# Patient Record
Sex: Male | Born: 1935
Health system: Southern US, Community
[De-identification: ages and names within clinical notes are randomized; demographics above are authoritative.]

## PROBLEM LIST (undated history)

## (undated) DIAGNOSIS — E785 Hyperlipidemia, unspecified: Secondary | ICD-10-CM

## (undated) DIAGNOSIS — I2510 Atherosclerotic heart disease of native coronary artery without angina pectoris: Secondary | ICD-10-CM

## (undated) DIAGNOSIS — R0989 Other specified symptoms and signs involving the circulatory and respiratory systems: Secondary | ICD-10-CM

## (undated) DIAGNOSIS — R002 Palpitations: Secondary | ICD-10-CM

## (undated) DIAGNOSIS — I359 Nonrheumatic aortic valve disorder, unspecified: Secondary | ICD-10-CM

## (undated) DIAGNOSIS — D649 Anemia, unspecified: Secondary | ICD-10-CM

## (undated) DIAGNOSIS — E1169 Type 2 diabetes mellitus with other specified complication: Secondary | ICD-10-CM

## (undated) DIAGNOSIS — Z952 Presence of prosthetic heart valve: Secondary | ICD-10-CM

## (undated) DIAGNOSIS — M1990 Unspecified osteoarthritis, unspecified site: Secondary | ICD-10-CM

## (undated) DIAGNOSIS — N289 Disorder of kidney and ureter, unspecified: Secondary | ICD-10-CM

## (undated) DIAGNOSIS — I200 Unstable angina: Secondary | ICD-10-CM

## (undated) DIAGNOSIS — D126 Benign neoplasm of colon, unspecified: Secondary | ICD-10-CM

## (undated) DIAGNOSIS — I350 Nonrheumatic aortic (valve) stenosis: Secondary | ICD-10-CM

## (undated) DIAGNOSIS — N400 Enlarged prostate without lower urinary tract symptoms: Secondary | ICD-10-CM

## (undated) DIAGNOSIS — I10 Essential (primary) hypertension: Secondary | ICD-10-CM

## (undated) DIAGNOSIS — I951 Orthostatic hypotension: Secondary | ICD-10-CM

## (undated) DIAGNOSIS — E119 Type 2 diabetes mellitus without complications: Secondary | ICD-10-CM

## (undated) HISTORY — PX: ESOPHAGOGASTRODUODENOSCOPY (EGD) WITH PROPOFOL: SHX5813

## (undated) HISTORY — DX: Hyperlipidemia, unspecified: E78.5

## (undated) HISTORY — DX: Benign prostatic hyperplasia without lower urinary tract symptoms: N40.0

## (undated) HISTORY — DX: Other specified symptoms and signs involving the circulatory and respiratory systems: R09.89

## (undated) HISTORY — DX: Presence of prosthetic heart valve: Z95.2

## (undated) HISTORY — DX: Orthostatic hypotension: I95.1

## (undated) HISTORY — PX: OTHER SURGICAL HISTORY: SHX169

## (undated) HISTORY — DX: Essential (primary) hypertension: I10

## (undated) HISTORY — DX: Palpitations: R00.2

## (undated) HISTORY — PX: AORTIC VALVE REPLACEMENT: SHX41

## (undated) HISTORY — PX: HERNIA REPAIR: SHX51

## (undated) HISTORY — PX: COLONOSCOPY WITH PROPOFOL: SHX5780

## (undated) HISTORY — DX: Nonrheumatic aortic (valve) stenosis: I35.0

## (undated) HISTORY — DX: Unstable angina: I20.0

## (undated) HISTORY — PX: TEE WITHOUT CARDIOVERSION: SHX5443

## (undated) HISTORY — DX: Atherosclerotic heart disease of native coronary artery without angina pectoris: I25.10

## (undated) HISTORY — PX: CORONARY ARTERY BYPASS GRAFT: SHX141

## (undated) HISTORY — PX: REPLACEMENT TOTAL KNEE BILATERAL: SUR1225

## (undated) HISTORY — DX: Nonrheumatic aortic valve disorder, unspecified: I35.9

## (undated) HISTORY — DX: Anemia, unspecified: D64.9

## (undated) HISTORY — DX: Type 2 diabetes mellitus with other specified complication: E11.69

## (undated) HISTORY — DX: Benign neoplasm of colon, unspecified: D12.6

---

## 1997-10-19 ENCOUNTER — Ambulatory Visit (HOSPITAL_COMMUNITY): Admission: RE | Admit: 1997-10-19 | Discharge: 1997-10-19 | Payer: Self-pay | Admitting: *Deleted

## 2000-02-22 ENCOUNTER — Ambulatory Visit (HOSPITAL_COMMUNITY): Admission: RE | Admit: 2000-02-22 | Discharge: 2000-02-22 | Payer: Self-pay | Admitting: Gastroenterology

## 2000-02-22 ENCOUNTER — Encounter (INDEPENDENT_AMBULATORY_CARE_PROVIDER_SITE_OTHER): Payer: Self-pay | Admitting: Specialist

## 2001-02-02 ENCOUNTER — Inpatient Hospital Stay (HOSPITAL_COMMUNITY): Admission: RE | Admit: 2001-02-02 | Discharge: 2001-02-05 | Payer: Self-pay | Admitting: Orthopedic Surgery

## 2001-02-23 ENCOUNTER — Encounter: Admission: RE | Admit: 2001-02-23 | Discharge: 2001-04-09 | Payer: Self-pay | Admitting: Orthopedic Surgery

## 2003-01-03 ENCOUNTER — Ambulatory Visit (HOSPITAL_BASED_OUTPATIENT_CLINIC_OR_DEPARTMENT_OTHER): Admission: RE | Admit: 2003-01-03 | Discharge: 2003-01-03 | Payer: Self-pay | Admitting: Urology

## 2003-05-06 ENCOUNTER — Encounter (INDEPENDENT_AMBULATORY_CARE_PROVIDER_SITE_OTHER): Payer: Self-pay | Admitting: Specialist

## 2003-05-06 ENCOUNTER — Ambulatory Visit (HOSPITAL_COMMUNITY): Admission: RE | Admit: 2003-05-06 | Discharge: 2003-05-06 | Payer: Self-pay | Admitting: Gastroenterology

## 2005-05-10 ENCOUNTER — Inpatient Hospital Stay (HOSPITAL_COMMUNITY): Admission: RE | Admit: 2005-05-10 | Discharge: 2005-05-13 | Payer: Self-pay | Admitting: Orthopedic Surgery

## 2005-06-03 ENCOUNTER — Encounter: Admission: RE | Admit: 2005-06-03 | Discharge: 2005-07-23 | Payer: Self-pay | Admitting: Orthopedic Surgery

## 2006-12-24 ENCOUNTER — Ambulatory Visit: Payer: Self-pay | Admitting: Cardiology

## 2007-01-05 ENCOUNTER — Ambulatory Visit: Payer: Self-pay

## 2007-01-05 ENCOUNTER — Encounter: Payer: Self-pay | Admitting: Cardiology

## 2007-06-24 ENCOUNTER — Ambulatory Visit: Payer: Self-pay | Admitting: Cardiology

## 2008-06-23 DIAGNOSIS — D126 Benign neoplasm of colon, unspecified: Secondary | ICD-10-CM

## 2008-06-23 DIAGNOSIS — E1169 Type 2 diabetes mellitus with other specified complication: Secondary | ICD-10-CM

## 2008-06-23 DIAGNOSIS — E785 Hyperlipidemia, unspecified: Secondary | ICD-10-CM

## 2008-06-23 DIAGNOSIS — Z87898 Personal history of other specified conditions: Secondary | ICD-10-CM

## 2008-06-23 DIAGNOSIS — I359 Nonrheumatic aortic valve disorder, unspecified: Secondary | ICD-10-CM | POA: Insufficient documentation

## 2008-06-23 HISTORY — DX: Hyperlipidemia, unspecified: E78.5

## 2008-06-23 HISTORY — DX: Benign neoplasm of colon, unspecified: D12.6

## 2008-06-23 HISTORY — DX: Nonrheumatic aortic valve disorder, unspecified: I35.9

## 2008-06-23 HISTORY — DX: Type 2 diabetes mellitus with other specified complication: E11.69

## 2008-06-29 ENCOUNTER — Ambulatory Visit: Payer: Self-pay | Admitting: Cardiology

## 2008-07-15 ENCOUNTER — Ambulatory Visit: Payer: Self-pay

## 2008-07-15 ENCOUNTER — Encounter: Payer: Self-pay | Admitting: Cardiology

## 2008-07-22 ENCOUNTER — Telehealth: Payer: Self-pay | Admitting: Cardiology

## 2008-11-30 ENCOUNTER — Encounter (INDEPENDENT_AMBULATORY_CARE_PROVIDER_SITE_OTHER): Payer: Self-pay | Admitting: *Deleted

## 2009-02-01 ENCOUNTER — Ambulatory Visit: Payer: Self-pay | Admitting: Cardiology

## 2010-01-30 ENCOUNTER — Encounter: Payer: Self-pay | Admitting: Cardiology

## 2010-01-31 ENCOUNTER — Ambulatory Visit: Payer: Self-pay | Admitting: Cardiology

## 2010-04-26 NOTE — Miscellaneous (Signed)
  Clinical Lists Changes  Observations: Added new observation of ECHOINTERP: 1. Left ventricle: The cavity size was normal. Wall thickness was    increased in a pattern of mild LVH. Systolic function was    vigorous. The estimated ejection fraction was in the range of 65%    to 70%. Wall motion was normal; there were no regional wall    motion abnormalities. Doppler parameters are consistent with    abnormal left ventricular relaxation (grade 1 diastolic    dysfunction). 2. Aortic valve: Trileaflet; moderately calcified leaflets. Cusp    separation was moderately reduced. There was moderate stenosis.    Mean gradient: 34mm Hg (S). Valve area: 0.92cm^2(VTI). Valve    area: 0.96cm^2 (Vmax). 3. Mitral valve: Mildly calcified annulus. 4. Left atrium: The atrium was mildly dilated. 5. Right atrium: There was the appearance of a Chiari network. 6. Tricuspid valve: Mild regurgitation. 7. Pulmonary arteries: PA peak pressure: 25mm Hg (S). 8. Pericardium, extracardiac: There was no pericardial effusion. (07/15/2008 9:42)      Echocardiogram  Procedure date:  07/15/2008  Findings:      1. Left ventricle: The cavity size was normal. Wall thickness was    increased in a pattern of mild LVH. Systolic function was    vigorous. The estimated ejection fraction was in the range of 65%    to 70%. Wall motion was normal; there were no regional wall    motion abnormalities. Doppler parameters are consistent with    abnormal left ventricular relaxation (grade 1 diastolic    dysfunction). 2. Aortic valve: Trileaflet; moderately calcified leaflets. Cusp    separation was moderately reduced. There was moderate stenosis.    Mean gradient: 34mm Hg (S). Valve area: 0.92cm^2(VTI). Valve    area: 0.96cm^2 (Vmax). 3. Mitral valve: Mildly calcified annulus. 4. Left atrium: The atrium was mildly dilated. 5. Right atrium: There was the appearance of a Chiari network. 6. Tricuspid valve: Mild  regurgitation. 7. Pulmonary arteries: PA peak pressure: 25mm Hg (S). 8. Pericardium, extracardiac: There was no pericardial effusion.

## 2010-04-26 NOTE — Assessment & Plan Note (Signed)
Summary: [facility]   Visit Type:  Follow-up Primary Provider:  Dr. Vernon Prey  CC:  AS.  History of Present Illness: The patient presents for yearly follow up.  Since I last saw him he has had no new cardiovascular complaints.  He is active and can push a mower without chest pain or SOB.  He denies palpitaitons presyncope or syncope.  He has had no PND or orthopnea.  He is now being treated for HTN.    Current Medications (verified): 1)  Aspirin 81 Mg  Tabs (Aspirin) .Marland Kitchen.. 1 By Mouth Daily 2)  Metformin Hcl 500 Mg Tabs (Metformin Hcl) .Marland Kitchen.. 1 By Mouth Two Times A Day 3)  Lipitor 80 Mg Tabs (Atorvastatin Calcium) .Marland Kitchen.. 1 By Mouth Daily 4)  Lisinopril 10 Mg Tabs (Lisinopril) .Marland Kitchen.. 1 By Mouth Daily  Allergies (verified): No Known Drug Allergies  Past History:  Past Medical History: Reviewed history from 06/23/2008 and no changes required. AORTIC STENOSIS (ICD-424.1) HYPERLIPIDEMIA-MIXED (ICD-272.4) DM (ICD-250.00) BENIGN PROSTATIC HYPERTROPHY, HX OF (ICD-V13.8) COLONIC POLYPS (ICD-211.3)  Past Surgical History: Reviewed history from 06/23/2008 and no changes required. 2 - knee replacements TURP.  Hernia repair  Review of Systems       As stated in the HPI and negative for all other systems.   Vital Signs:  Patient profile:   77 year old male Height:      71 inches Weight:      189 pounds BMI:     26.46 Pulse rate:   68 / minute Resp:     18 per minute BP sitting:   128 / 78  (right arm)  Vitals Entered By: Marrion Coy, CNA (January 31, 2010 9:22 AM)  Physical Exam  General:  Well developed, well nourished, in no acute distress. Head:  normocephalic and atraumatic Eyes:  PERRLA/EOM intact; conjunctiva and lids normal. Mouth:  Teeth, gums and palate normal. Oral mucosa normal. Neck:  Neck supple, no JVD. No masses, thyromegaly or abnormal cervical nodes. Chest Wall:  no deformities or breast masses noted Lungs:  Clear bilaterally to auscultation and  percussion. Abdomen:  Bowel sounds positive; abdomen soft and non-tender without masses, organomegaly, or hernias noted. No hepatosplenomegaly. Msk:  Back normal, normal gait. Muscle strength and tone normal. Extremities:  No clubbing or cyanosis. Neurologic:  Alert and oriented x 3. Skin:  Intact without lesions or rashes. Cervical Nodes:  no significant adenopathy Axillary Nodes:  no significant adenopathy Inguinal Nodes:  no significant adenopathy Psych:  Normal affect.   Detailed Cardiovascular Exam  Neck    Carotids: Carotids full and equal bilaterally without bruits.      Neck Veins: Normal, no JVD.    Heart    Inspection: no deformities or lifts noted.      Palpation: normal PMI with no thrills palpable.      Auscultation: S1 and S2 within normal limits, no S3, no S4, 3/6 apical systolic murmur radiating up the aortic outflow tract, no diastolic murmurs.  Vascular    Abdominal Aorta: no palpable masses, pulsations, or audible bruits.      Femoral Pulses: normal femoral pulses bilaterally.      Pedal Pulses: normal pedal pulses bilaterally.      Radial Pulses: normal radial pulses bilaterally.      Peripheral Circulation: no clubbing, cyanosis, or edema noted with normal capillary refill.     EKG  Procedure date:  01/31/2010  Findings:      Sinus rhythm, no acute ST  T wave chagnes.  Impression & Recommendations:  Problem # 1:  AORTIC STENOSIS (ICD-424.1)  I will repeat an echo in April which will be two years from the previous.  Orders: EKG w/ Interpretation (93000) Echocardiogram (Echo)  Problem # 2:  HYPERLIPIDEMIA-MIXED (ICD-272.4) He has good control per Dr. Christell Constant.  Patient Instructions: 1)  Your physician recommends that you schedule a follow-up appointment in: 1 yr with Dr Antoine Poche in [place] 2)  Your physician recommends that you continue on your current medications as directed. Please refer to the Current Medication list given to you today. 3)  Your  physician has requested that you have an echocardiogram. Scheduled for 07/13/2010 at 2 pm.Echocardiography is a painless test that uses sound waves to create images of your heart. It provides your doctor with information about the size and shape of your heart and how well your heart's chambers and valves are working.  This procedure takes approximately one hour. There are no restrictions for this procedure.

## 2010-06-22 ENCOUNTER — Other Ambulatory Visit: Payer: Self-pay | Admitting: Gastroenterology

## 2010-07-12 ENCOUNTER — Other Ambulatory Visit (HOSPITAL_COMMUNITY): Payer: Self-pay | Admitting: Cardiology

## 2010-07-12 DIAGNOSIS — I350 Nonrheumatic aortic (valve) stenosis: Secondary | ICD-10-CM

## 2010-07-13 ENCOUNTER — Ambulatory Visit (HOSPITAL_COMMUNITY): Payer: Medicare Other | Attending: Cardiology | Admitting: Radiology

## 2010-07-13 DIAGNOSIS — I350 Nonrheumatic aortic (valve) stenosis: Secondary | ICD-10-CM

## 2010-07-13 DIAGNOSIS — E119 Type 2 diabetes mellitus without complications: Secondary | ICD-10-CM | POA: Insufficient documentation

## 2010-07-13 DIAGNOSIS — I080 Rheumatic disorders of both mitral and aortic valves: Secondary | ICD-10-CM | POA: Insufficient documentation

## 2010-07-13 DIAGNOSIS — I359 Nonrheumatic aortic valve disorder, unspecified: Secondary | ICD-10-CM

## 2010-07-13 DIAGNOSIS — I079 Rheumatic tricuspid valve disease, unspecified: Secondary | ICD-10-CM | POA: Insufficient documentation

## 2010-07-13 DIAGNOSIS — E785 Hyperlipidemia, unspecified: Secondary | ICD-10-CM | POA: Insufficient documentation

## 2010-07-23 NOTE — Progress Notes (Signed)
No answer at home number - have attempted several times - will continue to call with results and appt scheduled for 5/2

## 2010-07-25 ENCOUNTER — Encounter: Payer: Self-pay | Admitting: Cardiology

## 2010-07-25 ENCOUNTER — Ambulatory Visit (INDEPENDENT_AMBULATORY_CARE_PROVIDER_SITE_OTHER): Payer: Medicare Other | Admitting: Cardiology

## 2010-07-25 ENCOUNTER — Encounter: Payer: Self-pay | Admitting: *Deleted

## 2010-07-25 VITALS — BP 111/63 | HR 71 | Ht 71.0 in | Wt 181.0 lb

## 2010-07-25 DIAGNOSIS — E785 Hyperlipidemia, unspecified: Secondary | ICD-10-CM

## 2010-07-25 DIAGNOSIS — I359 Nonrheumatic aortic valve disorder, unspecified: Secondary | ICD-10-CM

## 2010-07-25 DIAGNOSIS — E119 Type 2 diabetes mellitus without complications: Secondary | ICD-10-CM

## 2010-07-25 NOTE — Assessment & Plan Note (Signed)
His aortic stenosis is breasts severe gradient. He will need valve replacement. I discussed this with him and reviewed it with a heart model. He will need right and left heart catheterization prior to this. We discussed the risks and benefits of this and he understands and agrees to proceed.

## 2010-07-25 NOTE — Patient Instructions (Signed)
You are being schuduled for a cardiac cath on 08/07/2010 Please continue current medications

## 2010-07-25 NOTE — Progress Notes (Signed)
HPI The patient presents for followup of his aortic stenosis. Ascending echocardiogram a few days ago demonstrates that his AST has progressed significantly since the previous area of his EF was 65%. He had some LVH. His mean gradient was 58 with 89. The valve area 0.57. He actually reports that he still relatively asymptomatic but he had some burning one time while pushing a lawnmower on an incline. He otherwise remains active and has not had any shortness of breath, neck or arm discomfort, palpitations, presyncope or syncope. He denies any PND or orthopnea. He has had no weight gain or edema.  No Known Allergies  Current Outpatient Prescriptions  Medication Sig Dispense Refill  . aspirin 81 MG tablet Take 81 mg by mouth daily.        . ferrous sulfate 325 (65 FE) MG tablet Take 325 mg by mouth daily with breakfast.        . LIPITOR 80 MG tablet 1 tab qd      . lisinopril (PRINIVIL,ZESTRIL) 10 MG tablet 1 tab qd      . metFORMIN (GLUCOPHAGE) 500 MG tablet 1 tab twice a day      . polyethylene glycol-electrolytes (NULYTELY) 420 G solution         Past Medical History  Diagnosis Date  . Diabetes mellitus   . Aortic stenosis   . Hyperlipidemia   . Benign prostatic hypertrophy     Past Surgical History  Procedure Date  . Knee replacements     x 2  . Hernia repair     ROS:  As stated in the HPI and negative for all other systems.  PHYSICAL EXAM BP 111/63  Pulse 71  Ht 5\' 11"  (1.803 m)  Wt 181 lb (82.101 kg)  BMI 25.24 kg/m2  General:  Well developed, well nourished, in no acute distress. Head:  normocephalic and atraumatic Eyes:  PERRLA/EOM intact; conjunctiva and lids normal. Mouth:  Teeth, gums and palate normal. Oral mucosa normal. Neck:  Neck supple, no JVD. No masses, thyromegaly or abnormal cervical nodes. Chest Wall:  no deformities or breast masses noted Lungs:  Clear bilaterally to auscultation and percussion. Abdomen:  Bowel sounds positive; abdomen soft and  non-tender without masses, organomegaly, or hernias noted. No hepatosplenomegaly. Msk:  Back normal, normal gait. Muscle strength and tone normal. Extremities:  No clubbing or cyanosis. Neurologic:  Alert and oriented x 3. Skin:  Intact without lesions or rashes. Cervical Nodes:  no significant adenopathy Axillary Nodes:  no significant adenopathy Inguinal Nodes:  no significant adenopathy Psych:  Normal affect.   Detailed Cardiovascular Exam  Neck    Carotids: Carotids full and equal bilaterally without bruits.      Neck Veins: Normal, no JVD.    Heart    Inspection: no deformities or lifts noted.      Palpation: normal PMI with no thrills palpable.      Auscultation: S1 and S2 within normal limits, no S3, no S4, 3/6 apical systolic murmur radiating up the aortic outflow tract, no diastolic murmurs.  Vascular    Abdominal Aorta: no palpable masses, pulsations, or audible bruits.      Femoral Pulses: normal femoral pulses bilaterally.      Pedal Pulses: normal pedal pulses bilaterally.      Radial Pulses: normal radial pulses bilaterally.      Peripheral Circulation: no clubbing, cyanosis, or edema noted with normal capillary refill.    EKG:  Sinus rhythm rate 72, axis within normal limits,  intervals within normal limits, no acute ST-T wave changes.  ASSESSMENT AND PLAN

## 2010-07-25 NOTE — Assessment & Plan Note (Signed)
His lipids are well controlled and followed by Monica Becton, MD

## 2010-07-25 NOTE — Assessment & Plan Note (Signed)
His hemoglobin A1c 6.2 recently. He will continue on meds as listed.

## 2010-08-07 ENCOUNTER — Ambulatory Visit (HOSPITAL_COMMUNITY)
Admission: RE | Admit: 2010-08-07 | Discharge: 2010-08-07 | Disposition: A | Discharge status: Home / Self Care | Payer: Medicare Other | Source: Ambulatory Visit | Attending: Cardiology | Admitting: Cardiology

## 2010-08-07 DIAGNOSIS — I359 Nonrheumatic aortic valve disorder, unspecified: Secondary | ICD-10-CM

## 2010-08-07 DIAGNOSIS — R079 Chest pain, unspecified: Secondary | ICD-10-CM | POA: Insufficient documentation

## 2010-08-07 LAB — POCT I-STAT 3, ART BLOOD GAS (G3+)
Acid-Base Excess: 2 mmol/L (ref 0.0–2.0)
O2 Saturation: 93 %
TCO2: 28 mmol/L (ref 0–100)
pO2, Arterial: 67 mmHg — ABNORMAL LOW (ref 80.0–100.0)

## 2010-08-07 LAB — BASIC METABOLIC PANEL
BUN: 11 mg/dL (ref 6–23)
GFR calc Af Amer: >60 mL/min (ref >60)
GFR calc non Af Amer: >60 mL/min (ref >60)
Glucose, Bld: 119 mg/dL — ABNORMAL HIGH (ref 70–99)
Potassium: 4.1 mEq/L (ref 3.5–5.1)

## 2010-08-07 LAB — CBC
HCT: 37.1 % — ABNORMAL LOW (ref 39.0–52.0)
Hemoglobin: 12.4 g/dL — ABNORMAL LOW (ref 13.0–17.0)
Platelets: 176 10*3/uL (ref 150–400)
RBC: 4.33 MIL/uL (ref 4.22–5.81)

## 2010-08-07 LAB — APTT: aPTT: 32 seconds (ref 24–37)

## 2010-08-07 LAB — POCT I-STAT 3, VENOUS BLOOD GAS (G3P V)
pCO2, Ven: 45.9 mmHg (ref 45.0–50.0)
pO2, Ven: 38 mmHg (ref 30.0–45.0)

## 2010-08-07 NOTE — Assessment & Plan Note (Signed)
HEALTHCARE                            CARDIOLOGY OFFICE NOTE   [name], [name]                         MRN:          [id number]  DATE:12/24/2006                            DOB:          Jan 12, 1933    PRIMARY CARE PHYSICIAN:  Dr. Vernon Prey.   REASON FOR CONSULTATION:  Evaluate patient with heart murmur.   HISTORY OF PRESENT ILLNESS:  The patient is a very pleasant, 77 year old  gentleman without prior cardiac history other than evaluation for chest  pain in the 1970s. He says he had a negative stress test at that time.  He has otherwise gotten along well. He was recently found to have a  heart murmur and was referred for evaluation of this. He says this has  never been evaluated in the past. He is retired but still remains  active. He mows the lawn for instance. He pushes a mower. With this, he  denies any chest pressure, neck or arm discomfort. He does not have any  palpitations, presyncope or syncope. He denies any shortness of breath,  PND or orthopnea. He has no swelling or weight gain.   PAST MEDICAL HISTORY:  Diabetes x3 years, hyperlipidemia x2 years,  benign prostatic hypertrophy, colonic polyps resected.   PAST SURGICAL HISTORY:  Both knees replaced, TURP, hernia repair.   ALLERGIES:  None.   MEDICATIONS:  1. Metformin 500 mg daily.  2. Lipitor 40 mg daily.  3. Aspirin 325 mg daily.   SOCIAL HISTORY:  He smokes a few cigars but has never really smoked  cigarettes. He is retired. He is married and has 4 children. His wife is  one of our patients.   FAMILY HISTORY:  Noncontributory for early coronary artery disease.   REVIEW OF SYSTEMS:  As stated in the HPI, positive for occasional cough,  constipation. Negative for other systems.   PHYSICAL EXAMINATION:  GENERAL:  The patient is in no distress.  VITAL SIGNS:  Blood pressure 126/74, heart rate 72 and regular, weight  187 pounds, body mass index 26.  HEENT:  Eyelids  unremarkable. Pupils equal round and reactive to light.  Fundi not visualized. Oral mucosa unremarkable.  NECK:  No jugular venous distention at 45 degrees, carotid upstroke  brisk and symmetric, no bruits, no thyromegaly. Transmitted right  systolic murmur in the right carotid.  LYMPHATICS:  No cervical, axillary or inguinal adenopathy.  LUNGS:  Clear to auscultation bilaterally.  BACK:  No costovertebral angle tenderness.  CHEST:  Unremarkable.  HEART:  PMI not displaced or sustained, S1 within normal limits, soft  S2, no S3, no S4, 3/6 apical systolic murmur radiating out the right  aortic outflow tract, mid peaking, no diastolic murmurs.  ABDOMEN:  Flat, positive bowel sounds, normal in frequency and pitch, no  bruits, no rebound, no guarding, no midline pulsatile mass, no  hepatomegaly, no splenomegaly.  SKIN:  No rashes, no nodules.  EXTREMITIES:  2+ pulses throughout, no edema, no cyanosis, no clubbing.  NEUROLOGIC:  Oriented to person, place and time. Cranial nerves II-XII  grossly intact. Motor grossly intact.  EKG sinus rhythm, rate 72, axis within normal limits, interval is within  normal limits. No acute ST-T wave changes.   ASSESSMENT/PLAN:  1. Murmur. The patient has a murmur consistent with aortic stenosis. I      expect this to be mild or moderate by physical findings. He is not      having any symptoms related to this. I will check an echocardiogram      to further evaluate this. I suspect we will be able to follow this      clinically.  2. Tobacco. He understands the need to not smoke at all.  3. Hyperlipidemia per Dr. Christell Constant. He is having this followed closely.  4. Diabetes per Dr. Christell Constant.  5. Followup - I will see him back in 6 months. I will consider a      stress test after the echocardiogram given his risk factors.     Rollene Rotunda, MD, [facility]  Electronically Signed    JH/MedQ  DD: 12/24/2006  DT: 12/24/2006  Job #: [id number]   cc:   [name],  M.D.

## 2010-08-07 NOTE — Assessment & Plan Note (Signed)
[facility]                            CARDIOLOGY OFFICE NOTE   [name], [name]                         MRN:          [id number]  DATE:06/24/2007                            DOB:          1932/07/31    The primary is [name], M.D.   REASON FOR PRESENTATION:  Evaluate patient with aortic stenosis.   HISTORY OF PRESENT ILLNESS:  The patient is 77 years old.  He had a  murmur and I saw him for the first time in October of last year.  He had  an echocardiogram demonstrating a three-leaflet aortic valve with  moderate stenosis.  The mean gradient was 23.  The valve area calculated  to be 1.82.   The patient returns today for follow-up.  He still is quite  asymptomatic.  He is able to push a mower as recently as last night  without any shortness of breath.  He denies any chest discomfort, neck  or arm discomfort.  He has no lightheadedness, presyncope or syncope.  He does not have any palpitations.   PAST MEDICAL HISTORY:  1. Diabetes mellitus x3 years.  2. Dyslipidemia x2 years.  3. Benign prostatic hypertrophy.  4. Colonic polyps resected.  5. Both knees replaced.  6. TURP.  7. Hernia repair.   ALLERGIES:  None.   MEDICATIONS:  1. Lipitor 40 mg daily.  2. Metformin 500 mg daily.   REVIEW OF SYSTEMS:  As stated in the HPI and otherwise negative for  other systems.   PHYSICAL EXAMINATION:  The patient is in no distress.  Blood pressure 140/80, heart rate 66 and regular, weight 190 pounds,  body mass index 26.  HEENT:  Eyelids unremarkable.  Pupils equal, round, reactive to light.  Fundi not visualized.  NECK:  No jugular distention at 45 degrees.  Carotid upstroke brisk and  symmetrical.  No bruits.  No thyromegaly.  Transmitted systolic murmurs.  LYMPHATICS:  No adenopathy.  LUNGS:  Clear to auscultation bilaterally.  BACK:  There is no costovertebral angle tenderness.  CHEST:  Unremarkable.  HEART:  PMI not displaced or  sustained, S1 and S2 within normal limits,  no S3, no S4, a 3/6 apical systolic murmur radiating out the aortic  outflow tract and mid-peaking, no diastolic murmurs.  ABDOMEN:  Flat, positive bowel sounds, normal in frequency and pitch, no  bruits, no midline pulsatile mass, no organomegaly.  SKIN:  No rashes, no nodules.  EXTREMITIES:  2+ pulses, no edema.  NEUROLOGIC:  Grossly intact.   EKG:  Sinus rhythm, rate 66, axis within normal limits, intervals within  normal limits, no acute ST wave change.   ASSESSMENT/PLAN:  1. Aortic stenosis.  The patient has moderate aortic stenosis.  We      discussed this diagnosis and I reviewed it with him on a heart      model.  He understands the symptoms that could develop if he has      any worsening stenosis.  My plan is to get an echo in October.      That will  be 1 year for the time of his first echo.  We will see if      it is progressing rapidly and decide on the timing of further      evaluations.  2. Hypertension.  Blood pressure is borderline.  He is going to use      weight loss with therapeutic lifestyle changes to try to make sure      that this does not go up and require further therapy.  3. Diabetes.  Per Dr. Christell Constant.  4. Dyslipidemia.  Per Dr. Christell Constant.  5. Follow-up.  We will see the patient back in 1 year but, again, we      will get the echo in October.     Rollene Rotunda, MD, [facility]  Electronically Signed    JH/MedQ  DD: 06/24/2007  DT: 06/24/2007  Job #: [id number]   cc:   [name], M.D.

## 2010-08-07 NOTE — Assessment & Plan Note (Signed)
[facility]                            CARDIOLOGY OFFICE NOTE   [name], [name]                         MRN:          [id number]  DATE:06/29/2008                            DOB:          1933/02/25    PRIMARY CARE PHYSICIAN:  [name], MD   REASON FOR PRESENTATION:  Evaluate the patient with aortic stenosis.   HISTORY OF PRESENT ILLNESS:  The patient is 77 years old.  He has  history of aortic stenosis.  I was planning on getting an echocardiogram  in October of last year, which would have been 1 year from the previous.  However, this did not happen for some reason.  He says that he continues  to feel well.  He mowed the lawn recently and worked in the yard most of  the day.  He was fatigued after this, but he had no shortness of breath,  chest discomfort, neck or arm discomfort.  He had no palpitations,  presyncope, or syncope.  He does not describe PND or orthopnea.   PAST MEDICAL HISTORY:  1. Diabetes mellitus x4 years.  2. Dyslipidemia x3 years.  3. Mild-to-moderate aortic stenosis.  4. Benign prostatic hypertrophy.  5. Colonic polyp resected.  6. Both knees replaced.  7. TURP.  8. Hernia repair.   ALLERGIES:  None.   MEDICATIONS:  1. Lipitor 40 mg daily.  2. Metformin 500 mg daily.   REVIEW OF SYSTEMS:  As stated in the HPI, and otherwise negative for  other systems.   PHYSICAL EXAMINATION:  GENERAL:  The patient is in no distress.  VITAL SIGNS:  Blood pressure 124/74, heart rate 76 and regular, weight  189 pounds, and body mass index 25.  HEENT:  Eyelids unremarkable; pupils equal, round, and react to light;  fundi not visualized, oral mucosa unremarkable.  NECK:  No jugular venous distention at 45 degrees; carotid upstroke  brisk and symmetric; no bruits, no thyromegaly, transmitted systolic  murmur.  LYMPHATICS:  No adenopathy.  LUNGS:  Clear to auscultation bilaterally.  BACK:  No costovertebral angle tenderness.  CHEST:  Unremarkable.  HEART:  PMI not displaced or sustained; S1 and S2 within normal limits;  no S3, no S4; 3/6 apical systolic murmur radiating out the aortic  outflow tract, mid peaking, no diastolic murmur.  ABDOMEN:  Flat; positive bowel sounds, normal in frequency and pitch; no  bruits; no rebound; no guarding; no midline pulsatile mass; no  hepatomegaly, splenomegaly.  SKIN:  No rashes.  No nodules.  EXTREMITIES:  Pulses 2+, no edema.  NEURO:  Oriented to person, place, and time; cranial nerves II through  XII grossly intact, motor grossly intact.   EKG; sinus rhythm, rate 76, axis within normal limits, intervals within  normal limits, no acute ST-T wave changes.   ASSESSMENT AND PLAN:  1. Aortic stenosis.  Murmur does not appear to be late peaking.  I do      not find any other overt evidence that he has got severe AS. He      does not have any symptoms.  However, I would like to evaluate this      again with an echocardiogram to make sure there has been no      dramatic change.  If not, I will follow it clinically and with      fairly infrequent echos.  He knows the symptoms that could develop      if he has worsening aortic stenosis and he will notify me.  2. Diabetes.  He said it was slightly elevated recently, but Dr. Christell Constant      is addressing this.  3. Dyslipidemia.  I reviewed his lipid profile and it was excellent.      He will remain on the meds as listed.  4. Followup.  I will see him back in 1 year or sooner if needed.     Rollene Rotunda, MD, [facility]  Electronically Signed    JH/MedQ  DD: 06/29/2008  DT: 06/30/2008  Job #: [id number]   cc:   [name], M.D.

## 2010-08-10 ENCOUNTER — Encounter: Payer: Medicare Other | Admitting: Thoracic Surgery (Cardiothoracic Vascular Surgery)

## 2010-08-10 NOTE — Consult Note (Signed)
[facility]  Patient:    [name], [name] Visit Number: [id number] MRN: [id number]          Service Type: SUR Location: [id number] Attending Physician:  Loanne Drilling Dictated by:   Bertram Millard. Dahlstedt, M.D. Proc. Date: 02/02/01 Admit Date:  02/02/2001                            Consultation Report  CONSULATION/PROCEDURE NOTE  REASON FOR CONSULTATION:  Urinary retention, urethral stricture.  HISTORY OF PRESENT ILLNESS:  This is a 77 year old male who underwent left total knee arthroplasty today.  He has been seen before for BPH.  He had a TURP several years ago.  I did not see him prior to his operation, and I am unaware of any voiding symptoms.  However, under anesthesia, it was difficult to place a catheter, and urologic consultation is requested.  PHYSICAL EXAMINATION:  Examination in the PACU revealed an uncircumcised phallus with an easily retracted foreskin.  The glans was normal.  Meatus normal location and size.  Scrotal skin was unremarkable.  Peroneal skin unremarkable.  Rectal not performed.  DESCRIPTION OF PROCEDURE:  I tried placing a 16 and 14 coude tipped catheter unsuccessfully.  Using flexible cystoscopy, I realized that there was a very tight stricture at the bladder neck area.  I easily passed the guidewire through this contracture.  I then tried dilating this stricture with Heymann followers using the Seldinger technique.  I was unable to dilate the stricture.  I then placed the fiberglass filiform cystoscopically and dilated him to 20 [place] with filiforms and followers.  This went quite easily.  I then passed a 54 [place] coude tipped catheter to drainage.  IMPRESSION: 1. Urinary retention, specified. 2. Bladder neck contracture. 3. History of benign prostatic hypertrophy, status post    transurethral resection of prostate.  PLAN: 1. Gentamycin 80 mg IV ordered. 2. I would keep the patient on oral antibiotics for five  days.    Will write this in the orders. 3. Leave catheter in for two days until it is removed on    in the morning on February 04, 2001, for a voiding trial. 4. We will follow. Dictated by:   Bertram Millard. Dahlstedt, M.D. Attending Physician:  Loanne Drilling DD:  02/02/01 TD:  02/03/01 Job: [id number]

## 2010-08-10 NOTE — Op Note (Signed)
NAMESAIGE, BUSBY                            ACCOUNT NO.:  [id number]   MEDICAL RECORD NO.:  [id number]                   PATIENT TYPE:  AMB   LOCATION:  ENDO                                 FACILITY:  [facility]   PHYSICIAN:  John C. Madilyn Fireman, M.D.                 DATE OF BIRTH:  [birth year]   DATE OF PROCEDURE:  05/06/2003  DATE OF DISCHARGE:                                 OPERATIVE REPORT   PROCEDURE:  Colonoscopy.   INDICATIONS FOR PROCEDURE:  History of a large tubulovillous adenoma with  focal high-grade dysplasia two years ago.   DESCRIPTION OF PROCEDURE:  The Olympus video colonoscope was inserted into  the rectum and advanced to the cecum, confirmed by transillumination of  McBurney's point and visualization of the ileocecal valve and appendiceal  orifice.  Prep was good.  The cecum appeared normal with no masses, polyps,  diverticula, or other mucosal abnormalities.  Within the ascending colon,  there was a 1.2 cm sessile, round polyp that was removed by snare.  The  remainder of the ascending, transverse, descending and sigmoid appeared  normal with no further polyps, masses, diverticula, or other mucosal  abnormalities.  The rectum likewise appeared normal and retroflexed view of  the anus revealed no obvious internal hemorrhoids.  The scope was then  withdrawn and the patient returned to the recovery room in stable condition.  He  tolerated the procedure well and there were no immediate complications.   IMPRESSION:  Moderately large ascending colon polyp.   PLAN:  Await histology.  Assuming no malignancy, will probably repeat  coloscopy in three years.                                               John C. Madilyn Fireman, M.D.    JCH/MEDQ  D:  05/06/2003  T:  05/06/2003  Job:  [id number]   cc:   [name], M.D.  [address] [place]  [place] [address]  Fax: [phone]

## 2010-08-10 NOTE — Op Note (Signed)
NAMEJIM, LUNDIN                  ACCOUNT NO.:  [id number]   MEDICAL RECORD NO.:  [id number]          PATIENT TYPE:  INP   LOCATION:  X003                         FACILITY:  [facility]   PHYSICIAN:  Ollen Gross, M.D.    DATE OF BIRTH:  1933/01/03   DATE OF PROCEDURE:  05/10/2005  DATE OF DISCHARGE:                                 OPERATIVE REPORT   PREOPERATIVE DIAGNOSIS:  Osteoarthritis right knee.   POSTOPERATIVE DIAGNOSIS:  Osteoarthritis right knee.   PROCEDURE:  Right total knee arthroplasty.   SURGEON:  Ollen Gross, M.D.   ASSISTANT:  Alexzandrew L. Julien Girt, P.A.   ANESTHESIA:  Attempted spinal then conversion to general.   ESTIMATED BLOOD LOSS:  Minimal.   DRAINS:  Hemovac x1   TOURNIQUET TIME:  45 minutes at 300 mmHg.   COMPLICATIONS:  None.   CONDITION:  Stable to recovery.   BRIEF CLINICAL NOTE:  Mr. Gaffey is a 77-year-old male with end-stage  osteoarthritis of the right knee. He has had a previous successful left  total knee arthroplasty and presents now for right total knee arthroplasty.   PROCEDURE IN DETAIL:  After attempted administration of spinal anesthetic a  tourniquet was placed high in the right thigh and the right lower extremity  prepped and draped in the usual sterile fashion. The extremities were  wrapped in Esmarch, and knee flexed, and tourniquet inflated to 300 mmHg. A  midline incision was made with a 10-blade to the subcutaneous tissue. He  felt the incision, thus he was converted over to general anesthesia. Once  under general, we completed the incision through the subcutaneous tissue to  a level of the extensor mechanism. Fresh blade used to make a medial  parapatellar arthrotomy. Soft tissue over the proximal and medial tibia  subperiosteally elevated to the joint line with a knife and into the  semimembranosus bursa with a Cobb elevator. Soft tissue over the proximal  lateral tibia was also elevated with attention being paid to avoid  the  patellar tendon on the tibial tubercle. Patella subluxed laterally and knee  flexed to 90 degrees; and ACL and PCL removed. A drill was used to create a  starting hole in the distal femur, and canal thoroughly irrigated. A 5-  degree right valgus alignment guide is placed and referencing with the  posterior condyles rotations are marked and a block pinned to remove 10 mm  off the distal femur. Distal femoral resection was made with an oscillating  saw. Size 4 is the most appropriate femoral component and the rotations  marked off the epicondylar axis. Size 4 cutting block is placed and the  anterior, posterior, and chamfer cuts made.   Tibia subluxed forward and the menisci removed. Extramedullary tibial  alignment guides placed referencing proximally at the medial aspect of  tibial tubercle and distally along the second metatarsal axis and tibial  crest. A block is pinned to remove 10-mm of the non deficient lateral side.  Tibial resection is made an oscillating saw. We had to take an additional 2  mm to get to the  bottom of the medial defect. The size 5 is most appropriate  tibial component and the proximal tibia is prepared with a modular drill and  keel punch for a size 5. Femoral preparation is completed with the  intercondylar cut for the size 4.   Size five mobile bearing tibial trial with a size 4 posterior stabilized  femoral trial and a 12.5 mm posterior stabilized rotating platform insert  trial was placed. We used a 12.5 because of an additional tibial resection.  With the 12.5 full extension is achieved with excellent varus and valgus  balance throughout full range of motion.   The patella is then everted and thickness measured to be 24 mm. Freehand  resection was taken 13 mm, 41-template is placed, lug holes were drilled,  trial patella was placed, and it tracks normally. Osteophytes then removed  with the posterior femur trial in place. All trials were removed and  the cut  bone surfaces prepared with pulsatile lavage. Cement is mixed and once  reimplantation a size 5 mobile bearing tibial tray, size 4 posterior  stabilized femur, and 41 patella are cemented into place. The patella is  held with the clamp. A trial 12.5 insert is placed and the knee held in full  extension; and all extruded cement removed. Once the cement is fully  hardened, then the permanent 12.5 mm posterior stabilized rotating platform  insert is placed into the tibial tray. The wound is copiously irrigated with  saline solution; and the extensor mechanism closed over a Hemovac drain with  interrupted #1 PDS. Flexion against gravity is 135 degrees.   The tourniquet is released after the total time of 45 minutes. Subcu is  closed with interrupted 2-0 Vicryl subcuticular running 4-0 Monocryl.  Incisions cleaned and dried and Steri-Strips and a bulky sterile dressing  applied. Drains hooked to suction  He was placed into a knee immobilizer,  and awakened and transported to recovery in stable condition.      Ollen Gross, M.D.  Electronically Signed     FA/MEDQ  D:  05/10/2005  T:  05/10/2005  Job:  [id number]

## 2010-08-10 NOTE — Discharge Summary (Signed)
[name]  Patient:    [name], [name] Visit Number: [id number] MRN: [id number]          Service Type: SUR Location: 4W 0468 [id number] Attending Physician:  Loanne Drilling Dictated by:   Alexzandrew L. Perkins, P.A.-C. Admit Date:  02/02/2001 Discharge Date: 02/05/2001                             Discharge Summary  ADMISSION DIAGNOSES: 1. End stage left knee degenerative arthritis. 2. Benign prostatic hypertrophy. 3. Status post transurethral resection of prostate.  DISCHARGE DIAGNOSES: 1. Osteoarthritis of left knee, status post left total knee replacement    arthroplasty. 2. Mild postoperative anemia 3. Positive fluid balance. 4. Mild postoperative hyponatremia. 5. Postoperative urinary retention secondary to urethral stricture. 6. Bladder neck contracture.  CONSULTATION:  Urology, Dr. Retta Diones.  PROCEDURE:  The patient was taken to the OR on 02/02/01, and underwent a left total knee replacement arthroplasty.  Surgeon was Dr. Ollen Gross, assistant was Ralene Bathe, P.A.-C.  Surgery was done under general anesthesia.  HISTORY OF PRESENT ILLNESS:  The patient is a 77 year old male who has been seen for continued progressive problems of the left knee.  He has been treated conservatively in the past, however, he has not responded to conservative measures.  It is felt due to the pain and also the fact that it had been interfering with his day to day activities, it was felt he would benefit from undergoing a total knee replacement arthroplasty.  The patient is subsequently admitted to the hospital for the procedure.  LABORATORY DATA:  CBC on admission showed a hemoglobin of 14.8, hematocrit 41.9, white blood cell count 10.5, red blood cell count 4.84.  Serial hemoglobins and hematocrits were followed throughout the hospital course, last noted hemoglobin and hematocrit was 10.6 and 30.6.  PT and PTT on admission were 13.2 and 28, respectively,  with an INR of 1.0.  Serum prothrombin times were followed per Coumadin protocol.  Last noted PT/INR prior to discharge was 19.2 and 1.9.  Chem panel on admission all within normal limits.  Serial BMETs were followed throughout the hospital course.  Sodium dropped postoperatively down to a level of 132, however, started to come back up, and was back up to 134 prior to discharge.  Glucose did elevate from the admission level of 110 up to 245.  The dextrose was removed from his IV fluids, and his glucose came back down to 162.  Urinalysis on admission was negative.  Blood group type O+. I do not see a chest x-ray or EKG on this chart at the time of dictation.  HOSPITAL COURSE:  The patient was admitted to [facility], taken to the OR, underwent the above stated procedure without complication.  The patient tolerated the procedure well.  Later transferred to the recovery room. Please note it was difficult for placing the Foley, therefore a urology consult was called.  The patient was seen and evaluated by Dr. Retta Diones in the operating room.  Foley catheter was placed.  It was recommended that he continue with the Foley catheter for two days, and he was also placed on gentamicin IV for coverage.  The patient was placed on PCA analgesia for pain control following surgery.  Hemovac was pulled on postoperative day #1.  He was noted to have fluid balance, and underwent a mild diuresis, utilizing Lasix.  The patient was noted to have a  drop in his sodium over the first two days, however, did start to come back up, it got down to 132, came back up to 134 prior to discharge.  The patient was also noted to have an elevated glucose level after surgery with a normal preoperative glucose level. Therefore, the D5 was removed from his fluids, and his glucose levels came back down.  Foley was removed on postoperative day #2.  PCA was also discontinued at that time.  Physical therapy was consulted to  assist with gait training ambulation following surgery.  The patient did quite well with physical therapy.  He was ambulating only 5 feet by postoperative day #1, however, increased to 60 feet by postoperative day #2, and was walking greater than 130 feet prior to his discharge.  He continued to improve.  Dressing changes were initiated on postoperative day #2.  The wound was healing well, and by postoperative day #3, the incision looked fine.  His hemoglobin remained stable.  He had been weaned off of his Foley, and also his PCA analgesics.  He was taking p.o. analgesics.  It was recommended that he be placed on Cipro p.o. x 1 more day.  Due to the fact that he was doing well, he was discharged home on 02/05/01.  DISCHARGE PLAN:  The patient was discharged home on 02/05/01.  DISCHARGE DIAGNOSES:  Same as above.  DISCHARGE MEDICATIONS: 1. Cipro 250 mg p.o. t.i.d. x 1 day. 2. Coumadin as per pharmacy protocol. 3. Trinsicon x 3 weeks. 4. Colace. 5. Robaxin. 6. Percocet.  DIET:  As tolerated.  ACTIVITY:  Full weightbearing.  Total knee protocol.  Home health physical therapy, home health nursing.  [facility].  FOLLOWUP:  Call for an appointment on Tuesday, February 17, 2001, at [phone].  CONDITION ON DISCHARGE:  Improved. Dictated by:   Alexzandrew L. Perkins, P.A.-C. Attending Physician:  Loanne Drilling DD:  03/03/01 TD:  03/04/01 Job: [id number]

## 2010-08-10 NOTE — Procedures (Signed)
[facility]  Patient:    [name], [name]                           MRN: [id number] Proc. Date: 02/22/00 Attending:  Everardo All. Madilyn Fireman, M.D. CC:         Monica Becton, M.D.                           Procedure Report  PROCEDURE:  Colonoscopy with polypectomy.  INDICATIONS FOR PROCEDURE:  Polyps seen on flexible sigmoidoscopy.  DESCRIPTION OF PROCEDURE:  The patient was placed in the left lateral decubitus position and placed on the pulse monitor with continuous low-flow oxygen delivered by nasal cannula.  He was sedated with 50 mg IV Demerol and 6 mg IV Versed.  The Olympus video colonoscope was inserted into the rectum and advanced to the cecum; confirmed by transillumination of McBurneys point and visualization of the ileocecal valve and appendiceal orifice.  Prep was excellent.  The cecum; ascending, transverse, descending and sigmoid colon all appeared normal with no masses, polyps, diverticuli or other mucosal abnormalities. Entered into the rectum and approximately 12 cm from the anus there was a large 2 cm polyp on the short stalk.  This was removed in one piece with the snare loop and retrieved through the anus intact.  The remainder of the rectum appeared normal.  The colonoscope was then withdrawn. The patient returned to the recovery room in stable condition.  He tolerated the procedure well and there were no immediate complications.  IMPRESSION: 1. Large rectal polyp. 2. Otherwise normal colonoscopy.  PLAN:  Await histology to rule out malignancy and to determine interval for future surveillance colonoscopy. DD:  02/22/00 TD:  02/22/00 Job: [id number]

## 2010-08-10 NOTE — Discharge Summary (Signed)
NAMEANTWIAN, Joseph                  ACCOUNT NO.:  [id number]   MEDICAL RECORD NO.:  [id number]          PATIENT TYPE:  INP   LOCATION:  1519                         FACILITY:  [facility]   PHYSICIAN:  Ollen Gross, M.D.    DATE OF BIRTH:  21-Feb-1933   DATE OF ADMISSION:  05/10/2005  DATE OF DISCHARGE:  05/13/2005                                 DISCHARGE SUMMARY   ADMISSION DIAGNOSES:  1.  Osteoarthritis of the right knee.  2.  Hypercholesterolemia.  3.  Past history of postoperative anemia.  4.  Past surgical history of postoperative hypernatremia.  5.  Past history of postoperative urinary retention.  6.  Bladder neck contracture.   DISCHARGE DIAGNOSES:  1.  Osteoarthritis right knee status post right total knee arthroplasty.  2.  Mild postoperative cholesterolemia, did not require transfusion.  3.  Postoperative hyponatremia.  4.  Past history of postoperative urinary retention.  5.  Bladder neck contracture.   PROCEDURES:  On May 10, 2005, right total knee surgery, Dr. Lequita Halt.  Assistant, Julien Girt, P.A.-C.  Anesthesia:  Attempted spinal, then conversion  to general.   CONSULTATIONS:  None.   BRIEF HISTORY:  [name] is a 77 year old male with end-stage arthroscopy to  the right knee as previous successful left total knee, now presents for  right total knee.   LABORATORY DATA:  Preop CBC:  Hemoglobin 14.1, hematocrit 40.6, white cell  count 8.0, postoperative hemoglobin 11.6.  Last noted H&H 10.3 and 30.0.  PT/PTT on admission 13.6 and 31.  INR 1.0.  Last noted pro time INR after  serial pro times, 18.0 and 1.5.  Chemistries on admission all within normal  limits.  Postop serial B-mets were followed.  Sodium dropped from 142 down  to 134, stabilized at 134.  Electrolytes remained within normal limits.  Preop UA negative.  Blood type O positive.  Two-view chest on May 06, 2005, no active disease.  EKG May 06, 2005, normal sinus rhythm.  Normal EKG.  .   HOSPITAL COURSE:  Admitted to [facility].  Tolerated procedure  well.  Transported to recovery then the Orthopedic floor.  Started on PCA  and p.o. analgesics.  Doing well on day one.  Had very minimal pain, slight  postoperative temperature.  Treated with antipyretics and incentive  spirometer.  PC and IV's were discontinued.  Foley on day #2 was doing well.  Dressing was changed.  Incision looked good.  From a therapy standpoint, he  did very well.  He was up ambulating approximately 60 feet with therapy and  then he stated he got up and walked a bit further the evening of day #2 and  the morning of day #3.  He is progressing very well.  Incision looked good.  Healing well on day #3, and was discharged home.   DISPOSITION:  The patient was discharged home on May 13, 2005.  For  discharge diagnoses please see above.   DISCHARGE MEDICATIONS:  1.  Percocet.  2.  Coumadin.  3.  Robaxin.   DISCHARGE INSTRUCTIONS:  1.  Diet:  As tolerated, low cholesterol.  2.  Activity:  Weightbearing as tolerated to the right lower extremity.      Continue gait training, ambulation, ADL's for Home OPT.  Follow up      nursing for pro time draws for DVT prophylaxis.   FOLLOWUP:  He is to follow up in the office in two weeks from surgery.  Contact the office at [phone].   CONDITION ON DISCHARGE:  He is improving.      Alexzandrew L. Julien Girt, P.A.      Ollen Gross, M.D.  Electronically Signed    ALP/MEDQ  D:  05/13/2005  T:  05/13/2005  Job:  [id number]   cc:   [name], M.D.  Fax: [phone]

## 2010-08-10 NOTE — H&P (Signed)
[facility]  Patient:    QUANTA, ROHER Visit Number: [id number] MRN: [id number]          Service Type: Attending:  Ollen Gross, M.D. Dictated by:   Druscilla Brownie. Shela Nevin, P.A. Adm. Date:  02/02/01   CC:         Dr. Reola Calkins, c/o Dr. Christell Constant; [place]   History and Physical  DATE OF BIRTH:  [birth year]  CHIEF COMPLAINT:  Problems with my knees more on left than right.  HISTORY OF PRESENT ILLNESS:  A 77 year old white male seen by [place] with continuing and progressive problems into his knee, more so on left than right. We have treated him with conservative treatment, but unfortunately the patient is not responding to this.  He has been treated by Dr. Christell Constant and Dr. Reola Calkins in [place] conservatively and again even injection with lidocaine, Marcaine, and Aristospan has not ameliorated his discomfort.  This has been going on for several years now.  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 After much discussion, it is felt he would benefit from surgical intervention and is being admitted for a total knee replacement arthroplasty to the left knee.  He has been cleared preoperatively by Dr. Reola Calkins of Dr. Buel Ream practice.  PAST MEDICAL HISTORY:  This gentleman has been in excellent health throughout his life time.  PAST SURGICAL HISTORY:  No major surgeries.  He did have a TUR at one time. He denies any medical illnesses.  CURRENT MEDICATIONS:  Aspirin and Tylenol.  He will stop the aspirin prior to surgery.  SOCIAL HISTORY:  The patient is married and he has occasional cigar and occasional beer.  FAMILY HISTORY:  Positive for stroke and cancer in the mother and diabetes in the father.  REVIEW OF SYSTEMS:  CNS:  No seizure disorder, paralysis, numbness, or double vision.  RESPIRATORY:  RESPIRATORY:  No productive  cough, hemoptysis, no shortness of breath.  CARDIOVASCULAR:  No chest pain, no angina, no orthopnea. GASTROINTESTINAL:  Occasional dyspepsia only with certain foods.  No nausea or vomiting, melena, or bloody stool.  GENITOURINARY:  No discharge, dysuria, hematuria.  Denies any urgency or hesitancy.  MUSCULOSKELETAL:  Primarily in present illness, his knees.  PHYSICAL EXAMINATION:  GENERAL:  Alert, cooperative, and friendly, 77 year old white male who is accompanied by his wife.  VITAL SIGNS:  Blood pressure 120/74, pulse 80, and respirations are 12.  HEENT:  Normocephalic.  Wears glasses.  PERRLA.  EOM intact.  Oropharynx is clear.  LUNGS:  Chest clear to auscultation, no rhonchi, no rales.  HEART:  Regular rate and rhythm, no murmurs were heard.  ABDOMEN:  Slightly obese and bowel sounds present.  Liver and spleen not felt.  GENITALIA/RECTAL:  Not done, not pertinent to present illness.  EXTREMITIES:  The patient has crepitus with range of motion of both knees.  He has developed a varus deformity about 10 degrees.  He is limited from full extension to 5 degrees and can flex to about 125 degrees.  Neurovascularly he is intact to the lower extremities.  ADMITTING DIAGNOSIS:  Left knee degenerative joint disease.  PLAN:  The patient will undergo left total knee replacement arthroplasty.  We discussed whether he will need rehab after his procedure.  It was determined that we would decide how he does postoperative with his knee protocol  before we would contact rehab. Dictated by:   Druscilla Brownie. Shela Nevin, P.A. Attending:  Ollen Gross, M.D. DD:  01/28/01 TD:  01/29/01 Job: [id number]

## 2010-08-10 NOTE — Op Note (Signed)
[facility]  Patient:    FILEMON, BRETON Visit Number: [id number] MRN: [id number]          Service Type: SUR Location: 4W [id number] [id number] Attending Physician:  Loanne Drilling Dictated by:   Ollen Gross, M.D. Proc. Date: 02/02/01 Admit Date:  02/02/2001                             Operative Report  PREOPERATIVE DIAGNOSIS:  Osteoarthritis left knee.  POSTOPERATIVE DIAGNOSIS: Osteoarthritis left knee.  OPERATION:  Left total arthroplasty.  SURGEON:  Ollen Gross, M.D.  ASSISTANT:  Ralene Bathe, P.A.  ANESTHESIA:  General.  ESTIMATED BLOOD LOSS:  Minimal.  DRAINS:  Hemovac x 1.  COMPLICATIONS:  None.  DISPOSITION:  Stable to recovery.  INDICATIONS:  Mr. Hoard is a 77 year old male with severe osteoarthritis of the left knee with pain refractory to nonoperative management.  He presents now for left total knee arthroplasty.  DESCRIPTION OF PROCEDURE:  After the successful administration of general anesthetic, the tourniquet was placed high on the left thigh.  The left lower extremity was prepped and draped in the usual sterile fashion.  Extremity was wrapped in an Esmarch, knee flexed and tourniquet inflated to 350 mmHg.  The standard midline incision was made and skin cut with 10 blade to subcutaneous tissue to the level of the extensor mechanism.  Fresh blade was used to make a medial parapatellar arthrotomy.  Soft tissue of the proximal and medial tibia, subperiosteally elevated through the joint line with the knife and ________ with a curved osteotome, soft tissue of the proximal lateral tibia was also elevated with attention being paid to avoid the patella tendon on the tibial tubercle.  Lateral patellar femoral ligaments cut.  Patella everted and knee flexed 90 degrees and the ACL and PCL removed.  The drill was used to create a starting hole in the distal femur for the alignment guide.  The canal was irrigated and the 5 degree left  valgus alignment guide placed.  The posterior condyles rotation was marked and a block insertion 10 mm up the distal femur.  Distal femur resection was made with an oscillating saw.  The sizing block is placed, and size 5 is most appropriate.  Referencing is off the posterior condyle and it corresponds at the epicondylar axis. The AP block is placed at a size 5 and anterior and posterior cuts made.  The tibia is subluxed forward and the menisci removed. External tibial alignment guides placed referencing from proximal to medial aspect of the tibial tubercle and distally along the second metatarsal axis of the tibial crest.  Blocks and pins removed, 10 mm with a nondeficient lateral side.  Tibial resection is made with an oscillating saw.  The anterior condylar and chamfer cuts were then made on the femur with a size 5 block.  Size 5 trial posterior stabilized femur with a size 5 mobile bearing tibial tray and a 10 mm posterior stabilizer rotating platform insert are placed.  Full extension is achieved with excellent varus and valgus balance past 100 degrees of flexion.  The patella is then everted. Thickness is 26 and free-hand resection taken to 14 mm.  The trial 41 patella is placed. Free-hand resection made and the lug holes drilled. Trial patella is placed and then it tracks normal.  The proximal tibia if prepared with the modular drilling Keel punch. Osteophytes are removed off the posterior femur with the  trial in place.  The cut bone surface was then prepared with pulsatile lavage and the cement mix. Once we were ready for implantation size 5 posterior stabilized femur size 5 mobile bearing tibial tray and 41 patella cemented into place, the patella was held with the clamp.  Trial 10 mm spacer placed and knee filled and held in full extension.  Extruded cement removed.  Once the cement was fully hardened and made permanent, the 10 mm posterior stabilized mobile bearing inserts  placed into the tibial tray and the knee reduced.  The wound was copiously irrigated with antibiotic solution.  Extensor mechanism was closed over Hemovac drain with interrupted #1 PDS.  Flexion against gravity is 130 degrees.  The subcu was closed with interrupted 2-0 Vicryl, subcuticular with running 4-0 Monocryl.  Drains hooked to suction.  Incision clean and dry and Steri-Strips and a bulky sterile dressing applied.  He was placed into a knee immobilizer, awakened and transported to recovery in stable condition. Dictated by:   Ollen Gross, M.D. Attending Physician:  Loanne Drilling DD:  02/02/01 TD:  02/03/01 Job: [id number]

## 2010-08-10 NOTE — H&P (Signed)
[name], [name]                  ACCOUNT NO.:  [id number]   MEDICAL RECORD NO.:  [id number]          PATIENT TYPE:  INP   LOCATION:  NA                           FACILITY:  [facility]   PHYSICIAN:  Ollen Gross, M.D.    DATE OF BIRTH:  Nov 30, 1932   DATE OF ADMISSION:  05/10/2005  DATE OF DISCHARGE:                                HISTORY & PHYSICAL   Date of office visit, history and physical April 25, 2005. Date of  admission is May 10, 2005.   CHIEF COMPLAINT:  Right knee pain.   HISTORY OF PRESENT ILLNESS:  A 77 year old male with severe right knee pain.  He has known end-stage arthritis. He has previously been treated with a left  total knee, has been very pleased and has undergone a successful procedure.  He continues to have pain in the right knee and now presents for a right  total knee arthroplasty.   ALLERGIES:  No known drug allergies.   CURRENT MEDICATIONS:  1.  Lipitor 40 milligrams.  2.  Metformin 500 milligrams.  3.  Baby aspirin 81 milligrams.   PAST MEDICAL HISTORY:  Hypercholesterolemia.   PAST SURGICAL HISTORY:  1.  Right total knee arthroplasty.  2.  TURP.  3.  Hernia repair.   SOCIAL HISTORY:  Married, retired. No alcohol. Occasional smoker. Four  children. Wife will be assisting with care after surgery.   FAMILY HISTORY:  Stroke and cancer with mother. Diabetes with father.   REVIEW OF SYSTEMS:  GENERAL: No fevers, chills, or night sweats.  NEUROLOGICAL: No seizures, syncope or paralysis. RESPIRATORY: No shortness  of breath, productive cough or hemoptysis. He did have a virus or flu-like  symptoms over Christmas which did resolve. CARDIOVASCULAR: No chest pain,  angina or orthopnea. GI: No nausea, diarrhea or constipation. GU: No  dysuria, hematuria or discharge. MUSCULOSKELETAL: Right knee.   PHYSICAL EXAMINATION:  VITAL SIGNS: Pulse 88, respirations 12, blood  pressure 125/58.  GENERAL: A 77 year old white male well-developed,  well-nourished in no acute  distress. Alert, oriented and cooperative. Accompanied by his wife.  HEENT: Normocephalic and atraumatic. Pupils are equal, round, and reactive  to light. Oropharynx clear. EOMs intact.  NECK: Supple with faint bruits bilaterally but this is likely referred from  his thoracic murmur.  CHEST: Clear anterior and posterior chest walls. No rhonchi, rales or  wheezing.  HEART: Regular rhythm with a grade 3/6 systolic ejection murmur heard  throughout at aortic, pulmonic Erb's point, tricuspid and mitral point.  ABDOMEN: Soft, nontender and slightly round. Bowel sounds present.  RECTAL: Not done, not pertinent to present illness.  BREASTS: Not done, not pertinent to present illness.  GENITALIA: Not done, not pertinent to present illness.  EXTREMITIES: Right knee shows a large effusion, crepitus is noted, tender  medially. No instability.   IMPRESSION:  1.  Osteoarthritis right knee.  2.  Hypercholesterolemia.  3.  Past history of postoperative anemia.  4.  Past history of postoperative hyponatremia.  5.  Past history of postoperative urinary retention secondary to urethral      strictures.  6.  History of bladder neck contracture.   PLAN:  The patient is admitted to [facility] to undergo right  total knee arthroplasty. Surgery will be performed by Dr. Ollen Gross.      Alexzandrew L. Julien Girt, P.A.      Ollen Gross, M.D.  Electronically Signed    ALP/MEDQ  D:  05/09/2005  T:  05/10/2005  Job:  [id number]   cc:   Ollen Gross, M.D.  Fax: [phone]   [name], M.D.  Fax: [phone]

## 2010-08-10 NOTE — Op Note (Signed)
[name], [name]                            ACCOUNT NO.:  [id number]   MEDICAL RECORD NO.:  [id number]                   PATIENT TYPE:  AMB   LOCATION:  [facility]                                 FACILITY:  [facility]   PHYSICIAN:  Bertram Millard. Dahlstedt, M.D.          DATE OF BIRTH:  05/01/1932   DATE OF PROCEDURE:  01/03/2003  DATE OF DISCHARGE:                                 OPERATIVE REPORT   PREOPERATIVE DIAGNOSIS:  Bladder neck contracture.   POSTOPERATIVE DIAGNOSIS:  Bladder neck contracture.   PRINCIPAL PROCEDURE:  Transurethral resection of bladder neck contracture.   SURGEON:  Bertram Millard. Dahlstedt, M.D.   ANESTHESIA:  General with LMA.   COMPLICATIONS:  None.   BRIEF HISTORY:  A 77 year old male, who is status post a TURP several years  ago.  I first saw him a couple of years ago after a total knee replacement.  He needed emergent catheterization and dilation of his bladder neck  contraction.  He came in last week with difficulty voiding.  He is scheduled  to have a contralateral total knee replacement.  As the patient has had  worsening problems, he wanted to have his bladder neck contracture treated  before his surgery.   I saw him last week and performed a cystoscopy.  This showed a dense bladder  neck contracture, and I was not able to pass the scope.   He presents at this time for elective excision of his bladder neck  contracture.  He is aware of risks and complications and desires to proceed.   DESCRIPTION OF PROCEDURE:  The patient was administered preoperative IV  antibiotics and taken to the operating room where anesthetic was  administered.  He was placed in the dorsal lithotomy position.  Genitalia  and perineum were prepped and draped.  A resectoscope sheath was placed up  to the proximal urethra and then the cutting element with the [facility] knife  was placed.  He had a very dense contracture.  I then incised it at the 12  and 6 o'clock positions such that  the 28 French resectoscope sheath passed  quite easily.  The bladder was inspected and found to be slightly  trabeculated, but no lesions were seen.  The ureteral orifices were well  away from resection.  At this point, the bladder was drained.  It was  refilled, and no bleeding was seen.  A catheter was not left in.  The  resectoscope element was removed, and the patient was awakened.  He was  taken to the PACU in stable condition.                                               Bertram Millard. Retta Diones, M.D.    SMD/MEDQ  D:  01/03/2003  T:  01/03/2003  Job:  [id number]

## 2010-08-14 ENCOUNTER — Encounter (INDEPENDENT_AMBULATORY_CARE_PROVIDER_SITE_OTHER): Payer: Medicare Other | Admitting: Thoracic Surgery (Cardiothoracic Vascular Surgery)

## 2010-08-14 DIAGNOSIS — I2510 Atherosclerotic heart disease of native coronary artery without angina pectoris: Secondary | ICD-10-CM

## 2010-08-14 DIAGNOSIS — I359 Nonrheumatic aortic valve disorder, unspecified: Secondary | ICD-10-CM

## 2010-08-15 NOTE — Consult Note (Signed)
NEW PATIENT CONSULTATION  [name], [name] DOB:  [birth year]                                        Aug 14, 2010 CHART #:  [id number]  REASON FOR CONSULTATION:  Aortic stenosis and 3-vessel coronary disease.  HISTORY OF PRESENT ILLNESS:  Joseph [name] is a 77 year old gentleman who has been followed for aortic stenosis for several years now.  He had been asymptomatic until about a month ago when he had an episode of burning in his chest while mowing his grass.  This was relieved with rest.  It was not particularly associated with any shortness of breath.  He denies any other symptoms directly attributable to that but does note that he has been more tired than normal and fatigued a little more easily than normal recently.  He saw Dr. Christell Constant and Dr. Antoine Poche.  Echocardiogram was done which showed progression of his aortic stenosis with a valve area calculated at 0.57 cm2.  Index valve area was 0.47.  His mean gradient was 58 mmHg and peak gradient 89 mmHg.  Dr. Antoine Poche recommended aortic valve replacement and recommended right and left heart catheterization prior to the procedure.  That was done on May 15.  At that time, he was found to have normal PA pressures.  His mean aortic valve gradient was 27.  His valve index was 0.7.  Coronary angiography revealed 3-vessel coronary disease with 60% diffusely diseased LAD, 90% ostial in large second diagonal branch.  The ramus intermedius was a large vessel with a 90% stenosis.  Right coronary was large and dominant and diffusely diseased.  The only hemodynamically significant stenosis was a distal 90% lesion in the posterior descending.  Ejection fraction was 65%.  PAST MEDICAL HISTORY:  Significant for aortic stenosis, previously moderate now severe hypertension, hyperlipidemia, adult-onset type 2 diabetes mellitus, hiatal hernia, varicose veins, right lower leg, bilateral total knee replacement for degenerative joint  disease.  CURRENT MEDICATIONS: 1. Aspirin 81 mg daily. 2. Iron 325 mg daily. 3. Lipitor 80 mg nightly. 4. Lisinopril 10 mg daily. 5. Metformin 500 mg b.i.d. 6. Polyethylene glycol (NuLytely) for constipation.  ALLERGIES:  He has no known drug allergies.  FAMILY HISTORY:  Significant for cardiac disease.  SOCIAL HISTORY:  He still works.  He does service and repair type work. He has 4 children.  He is married.  He lives with his wife.  He has never smoked cigarettes.  Has had an occasional cigar. Does not drink alcohol.  REVIEW OF SYSTEMS:  Chest burning with exertion as noted in the HPI.  No paroxysmal nocturnal dyspnea, orthopnea, or peripheral edema.  Denies any claudication or TIA symptoms.  No history of excessive bleeding or bruising.  He does have some varicose veins in his right lower leg, lesser degree in the left lower leg.  He does have a hiatal hernia but rarely symptomatic from it.  All other systems are negative.  PHYSICAL EXAMINATION:  GENERAL:  Joseph [name] is a 77 year old gentleman in no acute distress. VITAL SIGNS:  Blood pressure 116/72, pulse 92, respirations 16, oxygen saturation 90% on room air. GENERAL:  He is well-developed, well-nourished in no acute distress. NEUROLOGICAL:  He is alert and oriented x3 with no focal deficits. HEENT:  Unremarkable. NECK:  Supple without thyromegaly or adenopathy.  There are transmitted heart murmur sounds bilaterally. LUNGS:  Clear  with equal breath sounds. CARDIAC:  He has a regular rate and rhythm.  There is a high-pitched crescendo-decrescendo murmur heard throughout the precordium. ABDOMEN:  Soft, nontender. EXTREMITIES:  Without clubbing, cyanosis, or edema.  He has 2+ pulses throughout.  He does have superficial varicosities in the right lower leg and to a lesser extent in the left lower leg.  LABORATORY DATA:  Cardiac catheterization and echocardiogram reviewed. Findings as previously noted.  IMPRESSION:  Mr.  [name] is a 77 year old gentleman with severe aortic stenosis and 3-vessel coronary disease, aortic valve replacement. Coronary artery bypass grafting is indicated for survival benefit as well as relief of symptoms, although he has been minimally symptomatic. From the chest pain standpoint, he has been having progressive fatigue and that may well be related to his cardiac issues as well.  The primary benefit of course is survival benefit in this setting.  I discussed in detail with Joseph [name] and Ms. Greb the indications, risks, benefits, and alternatives.  They understand that the risks of surgery include but are not limited to death, stroke, myocardial infarction, deep venous thrombosis, pulmonary embolism, bleeding, possible need for transfusions, infections as well as other organ system dysfunction including respiratory, renal or GI complications as well as the possibly of complete heart block requiring permanent pacemaker placement.  They do understand that the typical ICU stay would be 1-2 days followed by a 5-6 day hospitalization, but approximately a 75-month overall recovery. Joseph [name] understands and accepts the risks of the procedure and wishes to proceed with surgery.  We have scheduled him for Wednesday, May 30. He will be admitted on the day of surgery.  Salvatore Decent [name], M.D. Electronically Signed  SCH/MEDQ  D:  08/14/2010  T:  08/15/2010  Job:  [id number]  cc:   Rollene Rotunda, MD, [facility] [name], M.D.

## 2010-08-17 ENCOUNTER — Ambulatory Visit (HOSPITAL_COMMUNITY): Payer: Medicare Other

## 2010-08-17 ENCOUNTER — Encounter (HOSPITAL_COMMUNITY): Payer: Medicare Other

## 2010-08-17 ENCOUNTER — Other Ambulatory Visit (HOSPITAL_COMMUNITY): Payer: Medicare Other

## 2010-08-21 ENCOUNTER — Other Ambulatory Visit: Payer: Self-pay | Admitting: Thoracic Surgery (Cardiothoracic Vascular Surgery)

## 2010-08-21 ENCOUNTER — Encounter (HOSPITAL_COMMUNITY)
Admission: RE | Admit: 2010-08-21 | Discharge: 2010-08-21 | Disposition: A | Discharge status: Home / Self Care | Payer: Medicare Other | Source: Ambulatory Visit | Attending: Thoracic Surgery (Cardiothoracic Vascular Surgery) | Admitting: Thoracic Surgery (Cardiothoracic Vascular Surgery)

## 2010-08-21 ENCOUNTER — Ambulatory Visit (HOSPITAL_COMMUNITY)
Admission: RE | Admit: 2010-08-21 | Discharge: 2010-08-21 | Disposition: A | Discharge status: Home / Self Care | Payer: Medicare Other | Source: Ambulatory Visit | Attending: Thoracic Surgery (Cardiothoracic Vascular Surgery) | Admitting: Thoracic Surgery (Cardiothoracic Vascular Surgery)

## 2010-08-21 ENCOUNTER — Inpatient Hospital Stay (HOSPITAL_COMMUNITY)
Admission: RE | Admit: 2010-08-21 | Discharge: 2010-08-21 | Disposition: A | Discharge status: Home / Self Care | Payer: Medicare Other | Source: Ambulatory Visit | Attending: Thoracic Surgery (Cardiothoracic Vascular Surgery) | Admitting: Thoracic Surgery (Cardiothoracic Vascular Surgery)

## 2010-08-21 ENCOUNTER — Encounter (HOSPITAL_COMMUNITY): Payer: Medicare Other

## 2010-08-21 DIAGNOSIS — I350 Nonrheumatic aortic (valve) stenosis: Secondary | ICD-10-CM

## 2010-08-21 DIAGNOSIS — I359 Nonrheumatic aortic valve disorder, unspecified: Secondary | ICD-10-CM | POA: Insufficient documentation

## 2010-08-21 DIAGNOSIS — I2510 Atherosclerotic heart disease of native coronary artery without angina pectoris: Secondary | ICD-10-CM | POA: Insufficient documentation

## 2010-08-21 DIAGNOSIS — Z01810 Encounter for preprocedural cardiovascular examination: Secondary | ICD-10-CM | POA: Insufficient documentation

## 2010-08-21 DIAGNOSIS — I10 Essential (primary) hypertension: Secondary | ICD-10-CM | POA: Insufficient documentation

## 2010-08-21 DIAGNOSIS — Z01812 Encounter for preprocedural laboratory examination: Secondary | ICD-10-CM | POA: Insufficient documentation

## 2010-08-21 LAB — CBC
HCT: 37.7 % — ABNORMAL LOW (ref 39.0–52.0)
MCH: 28.5 pg (ref 26.0–34.0)
Platelets: 213 10*3/uL (ref 150–400)
RBC: 4.39 MIL/uL (ref 4.22–5.81)
WBC: 6.7 10*3/uL (ref 4.0–10.5)

## 2010-08-21 LAB — SURGICAL PCR SCREEN
MRSA, PCR: NEGATIVE
Staphylococcus aureus: NEGATIVE

## 2010-08-21 LAB — BLOOD GAS, ARTERIAL
Acid-Base Excess: 1.8 mmol/L (ref 0.0–2.0)
Patient temperature: 98.6
TCO2: 26.9 mmol/L (ref 0–100)
pCO2 arterial: 38.9 mmHg (ref 35.0–45.0)

## 2010-08-21 LAB — COMPREHENSIVE METABOLIC PANEL
ALT: 26 U/L (ref 0–53)
Albumin: 3.6 g/dL (ref 3.5–5.2)
Calcium: 9.4 mg/dL (ref 8.4–10.5)
GFR calc Af Amer: >60 mL/min (ref >60)
Glucose, Bld: 93 mg/dL (ref 70–99)
Sodium: 138 mEq/L (ref 135–145)
Total Protein: 6.6 g/dL (ref 6.0–8.3)

## 2010-08-21 LAB — HEMOGLOBIN A1C: Mean Plasma Glucose: 126 mg/dL — ABNORMAL HIGH (ref <117)

## 2010-08-21 LAB — URINALYSIS, ROUTINE W REFLEX MICROSCOPIC
Glucose, UA: NEGATIVE mg/dL
Ketones, ur: NEGATIVE mg/dL
Nitrite: NEGATIVE
Urobilinogen, UA: 0.2 mg/dL (ref 0.0–1.0)
pH: 6 (ref 5.0–8.0)

## 2010-08-21 LAB — URINE MICROSCOPIC-ADD ON

## 2010-08-21 LAB — PROTIME-INR: INR: 0.99 (ref 0.00–1.49)

## 2010-08-21 LAB — ABO/RH: ABO/RH(D): O POS

## 2010-08-21 LAB — TYPE AND SCREEN: ABO/RH(D): O POS

## 2010-08-22 ENCOUNTER — Inpatient Hospital Stay (HOSPITAL_COMMUNITY)
Admission: RE | Admit: 2010-08-22 | Discharge: 2010-08-27 | DRG: 220 | Disposition: A | Discharge status: Home Health | Payer: Medicare Other | Source: Ambulatory Visit | Attending: Thoracic Surgery (Cardiothoracic Vascular Surgery) | Admitting: Thoracic Surgery (Cardiothoracic Vascular Surgery)

## 2010-08-22 ENCOUNTER — Inpatient Hospital Stay (HOSPITAL_COMMUNITY): Payer: Medicare Other

## 2010-08-22 ENCOUNTER — Other Ambulatory Visit: Payer: Self-pay | Admitting: Thoracic Surgery (Cardiothoracic Vascular Surgery)

## 2010-08-22 DIAGNOSIS — D62 Acute posthemorrhagic anemia: Secondary | ICD-10-CM | POA: Diagnosis not present

## 2010-08-22 DIAGNOSIS — I2510 Atherosclerotic heart disease of native coronary artery without angina pectoris: Principal | ICD-10-CM | POA: Diagnosis present

## 2010-08-22 DIAGNOSIS — I10 Essential (primary) hypertension: Secondary | ICD-10-CM | POA: Diagnosis present

## 2010-08-22 DIAGNOSIS — I359 Nonrheumatic aortic valve disorder, unspecified: Secondary | ICD-10-CM

## 2010-08-22 DIAGNOSIS — F17200 Nicotine dependence, unspecified, uncomplicated: Secondary | ICD-10-CM | POA: Diagnosis present

## 2010-08-22 DIAGNOSIS — D696 Thrombocytopenia, unspecified: Secondary | ICD-10-CM | POA: Diagnosis present

## 2010-08-22 DIAGNOSIS — Z96659 Presence of unspecified artificial knee joint: Secondary | ICD-10-CM

## 2010-08-22 DIAGNOSIS — E119 Type 2 diabetes mellitus without complications: Secondary | ICD-10-CM | POA: Diagnosis present

## 2010-08-22 LAB — POCT I-STAT 4, (NA,K, GLUC, HGB,HCT)
Glucose, Bld: 111 mg/dL — ABNORMAL HIGH (ref 70–99)
HCT: 26 % — ABNORMAL LOW (ref 39.0–52.0)
Hemoglobin: 8.8 g/dL — ABNORMAL LOW (ref 13.0–17.0)
Potassium: 3.8 mEq/L (ref 3.5–5.1)

## 2010-08-22 LAB — CBC
HCT: 24.5 % — ABNORMAL LOW (ref 39.0–52.0)
Hemoglobin: 8.5 g/dL — ABNORMAL LOW (ref 13.0–17.0)
Hemoglobin: 8.8 g/dL — ABNORMAL LOW (ref 13.0–17.0)
MCH: 28.7 pg (ref 26.0–34.0)
MCHC: 33.3 g/dL (ref 30.0–36.0)
WBC: 12.6 10*3/uL — ABNORMAL HIGH (ref 4.0–10.5)

## 2010-08-22 LAB — POCT I-STAT, CHEM 8
Creatinine, Ser: 0.9 mg/dL (ref 0.4–1.5)
Glucose, Bld: 155 mg/dL — ABNORMAL HIGH (ref 70–99)
Hemoglobin: 8.2 g/dL — ABNORMAL LOW (ref 13.0–17.0)
Potassium: 4.5 mEq/L (ref 3.5–5.1)
TCO2: 23 mmol/L (ref 0–100)

## 2010-08-22 LAB — POCT I-STAT 3, ART BLOOD GAS (G3+)
O2 Saturation: 95 %
O2 Saturation: 99 %
Patient temperature: 37.3
TCO2: 24 mmol/L (ref 0–100)
TCO2: 23 mmol/L (ref 0–100)
pCO2 arterial: 35.8 mmHg (ref 35.0–45.0)
pO2, Arterial: 71 mmHg — ABNORMAL LOW (ref 80.0–100.0)

## 2010-08-22 LAB — PLATELET COUNT: Platelets: 139 10*3/uL — ABNORMAL LOW (ref 150–400)

## 2010-08-22 LAB — GLUCOSE, CAPILLARY
Glucose-Capillary: 61 mg/dL — ABNORMAL LOW (ref 70–99)
Glucose-Capillary: 124 mg/dL — ABNORMAL HIGH (ref 70–99)

## 2010-08-22 LAB — CREATININE, SERUM
Creatinine, Ser: 0.67 mg/dL (ref 0.4–1.5)
GFR calc non Af Amer: >60 mL/min (ref >60)

## 2010-08-22 LAB — PROTIME-INR: Prothrombin Time: 18 seconds — ABNORMAL HIGH (ref 11.6–15.2)

## 2010-08-23 ENCOUNTER — Inpatient Hospital Stay (HOSPITAL_COMMUNITY): Payer: Medicare Other

## 2010-08-23 LAB — BASIC METABOLIC PANEL
CO2: 22 mEq/L (ref 19–32)
Chloride: 109 mEq/L (ref 96–112)
Creatinine, Ser: 0.87 mg/dL (ref 0.4–1.5)
GFR calc Af Amer: >60 mL/min (ref >60)
Potassium: 5.4 mEq/L — ABNORMAL HIGH (ref 3.5–5.1)

## 2010-08-23 LAB — GLUCOSE, CAPILLARY
Glucose-Capillary: 108 mg/dL — ABNORMAL HIGH (ref 70–99)
Glucose-Capillary: 122 mg/dL — ABNORMAL HIGH (ref 70–99)
Glucose-Capillary: 122 mg/dL — ABNORMAL HIGH (ref 70–99)
Glucose-Capillary: 139 mg/dL — ABNORMAL HIGH (ref 70–99)
Glucose-Capillary: 160 mg/dL — ABNORMAL HIGH (ref 70–99)
Glucose-Capillary: 162 mg/dL — ABNORMAL HIGH (ref 70–99)
Glucose-Capillary: 111 mg/dL — ABNORMAL HIGH (ref 70–99)
Glucose-Capillary: 127 mg/dL — ABNORMAL HIGH (ref 70–99)
Glucose-Capillary: 185 mg/dL — ABNORMAL HIGH (ref 70–99)
Glucose-Capillary: 193 mg/dL — ABNORMAL HIGH (ref 70–99)
Glucose-Capillary: 187 mg/dL — ABNORMAL HIGH (ref 70–99)
Glucose-Capillary: 173 mg/dL — ABNORMAL HIGH (ref 70–99)

## 2010-08-23 LAB — CBC
Hemoglobin: 8.3 g/dL — ABNORMAL LOW (ref 13.0–17.0)
Hemoglobin: 8 g/dL — ABNORMAL LOW (ref 13.0–17.0)
MCH: 28.7 pg (ref 26.0–34.0)
MCH: 28.8 pg (ref 26.0–34.0)
MCHC: 33.1 g/dL (ref 30.0–36.0)
Platelets: 117 10*3/uL — ABNORMAL LOW (ref 150–400)
RBC: 2.89 MIL/uL — ABNORMAL LOW (ref 4.22–5.81)
RDW: 15.5 % (ref 11.5–15.5)
WBC: 14.6 10*3/uL — ABNORMAL HIGH (ref 4.0–10.5)

## 2010-08-23 LAB — POCT I-STAT, CHEM 8
Calcium, Ion: 1.26 mmol/L (ref 1.12–1.32)
Creatinine, Ser: 1.4 mg/dL (ref 0.4–1.5)
Glucose, Bld: 198 mg/dL — ABNORMAL HIGH (ref 70–99)
HCT: 23 % — ABNORMAL LOW (ref 39.0–52.0)
Hemoglobin: 7.8 g/dL — ABNORMAL LOW (ref 13.0–17.0)
Potassium: 4.4 mEq/L (ref 3.5–5.1)

## 2010-08-23 LAB — MAGNESIUM: Magnesium: 1.7 mg/dL (ref 1.5–2.5)

## 2010-08-23 LAB — CREATININE, SERUM: GFR calc Af Amer: >60 mL/min (ref >60)

## 2010-08-24 ENCOUNTER — Inpatient Hospital Stay (HOSPITAL_COMMUNITY): Payer: Medicare Other

## 2010-08-24 HISTORY — PX: CORONARY ARTERY BYPASS GRAFT: SHX141

## 2010-08-24 HISTORY — PX: AORTIC VALVE REPLACEMENT: SHX41

## 2010-08-24 LAB — BASIC METABOLIC PANEL
BUN: 26 mg/dL — ABNORMAL HIGH (ref 6–23)
Chloride: 105 mEq/L (ref 96–112)
GFR calc Af Amer: >60 mL/min (ref >60)
GFR calc non Af Amer: >60 mL/min (ref >60)
Potassium: 4.4 mEq/L (ref 3.5–5.1)
Sodium: 137 mEq/L (ref 135–145)

## 2010-08-24 LAB — CBC
Platelets: 97 10*3/uL — ABNORMAL LOW (ref 150–400)
RBC: 2.68 MIL/uL — ABNORMAL LOW (ref 4.22–5.81)
RDW: 15.8 % — ABNORMAL HIGH (ref 11.5–15.5)
WBC: 11.6 10*3/uL — ABNORMAL HIGH (ref 4.0–10.5)

## 2010-08-24 LAB — GLUCOSE, CAPILLARY
Glucose-Capillary: 90 mg/dL (ref 70–99)
Glucose-Capillary: 97 mg/dL (ref 70–99)
Glucose-Capillary: 103 mg/dL — ABNORMAL HIGH (ref 70–99)
Glucose-Capillary: 104 mg/dL — ABNORMAL HIGH (ref 70–99)
Glucose-Capillary: 93 mg/dL (ref 70–99)
Glucose-Capillary: 95 mg/dL (ref 70–99)
Glucose-Capillary: 152 mg/dL — ABNORMAL HIGH (ref 70–99)
Glucose-Capillary: 148 mg/dL — ABNORMAL HIGH (ref 70–99)
Glucose-Capillary: 111 mg/dL — ABNORMAL HIGH (ref 70–99)

## 2010-08-25 ENCOUNTER — Inpatient Hospital Stay (HOSPITAL_COMMUNITY): Payer: Medicare Other

## 2010-08-25 DIAGNOSIS — J90 Pleural effusion, not elsewhere classified: Secondary | ICD-10-CM

## 2010-08-25 LAB — CBC
HCT: 26 % — ABNORMAL LOW (ref 39.0–52.0)
MCH: 28.6 pg (ref 26.0–34.0)
MCV: 86.4 fL (ref 78.0–100.0)
RDW: 15.8 % — ABNORMAL HIGH (ref 11.5–15.5)
WBC: 12.8 10*3/uL — ABNORMAL HIGH (ref 4.0–10.5)

## 2010-08-25 LAB — BASIC METABOLIC PANEL
BUN: 31 mg/dL — ABNORMAL HIGH (ref 6–23)
Chloride: 105 mEq/L (ref 96–112)
GFR calc non Af Amer: >60 mL/min (ref >60)
Glucose, Bld: 107 mg/dL — ABNORMAL HIGH (ref 70–99)
Potassium: 3.7 mEq/L (ref 3.5–5.1)

## 2010-08-25 LAB — GLUCOSE, CAPILLARY
Glucose-Capillary: 112 mg/dL — ABNORMAL HIGH (ref 70–99)
Glucose-Capillary: 151 mg/dL — ABNORMAL HIGH (ref 70–99)
Glucose-Capillary: 103 mg/dL — ABNORMAL HIGH (ref 70–99)

## 2010-08-26 DIAGNOSIS — I2510 Atherosclerotic heart disease of native coronary artery without angina pectoris: Secondary | ICD-10-CM

## 2010-08-26 LAB — BASIC METABOLIC PANEL
Calcium: 8.3 mg/dL — ABNORMAL LOW (ref 8.4–10.5)
Chloride: 107 mEq/L (ref 96–112)
Creatinine, Ser: 0.71 mg/dL (ref 0.4–1.5)
GFR calc Af Amer: >60 mL/min (ref >60)
GFR calc non Af Amer: >60 mL/min (ref >60)

## 2010-08-26 LAB — GLUCOSE, CAPILLARY: Glucose-Capillary: 75 mg/dL (ref 70–99)

## 2010-08-26 LAB — PRO B NATRIURETIC PEPTIDE: Pro B Natriuretic peptide (BNP): 2469 pg/mL — ABNORMAL HIGH (ref 0–450)

## 2010-08-27 LAB — GLUCOSE, CAPILLARY: Glucose-Capillary: 91 mg/dL (ref 70–99)

## 2010-09-07 ENCOUNTER — Ambulatory Visit: Payer: Medicare Other

## 2010-09-07 ENCOUNTER — Encounter (INDEPENDENT_AMBULATORY_CARE_PROVIDER_SITE_OTHER): Payer: Self-pay

## 2010-09-07 DIAGNOSIS — I2510 Atherosclerotic heart disease of native coronary artery without angina pectoris: Secondary | ICD-10-CM

## 2010-09-07 DIAGNOSIS — I359 Nonrheumatic aortic valve disorder, unspecified: Secondary | ICD-10-CM

## 2010-09-10 NOTE — Op Note (Signed)
NAMEJOSHAWA, [name]                  ACCOUNT NO.:  [id number]  MEDICAL RECORD NO.:  [id number]           PATIENT TYPE:  I  LOCATION:  2312                         FACILITY:  [facility]  PHYSICIAN:  Bedelia Person, M.D.        DATE OF BIRTH:  10-29-1932  DATE OF PROCEDURE:  08/22/2010 DATE OF DISCHARGE:                              OPERATIVE REPORT   PROCEDURE:  Intraoperative transesophageal echocardiography.  SURGEON:  Salvatore Decent. [name], MD  [name] is scheduled for an aortic valve replacement secondary to longstanding aortic stenosis.  He has no history of esophageal or gastric pathology.  The TEE will be used intraoperatively to assess the valvular replacement as well as associated pathology with the surgical procedure.  After induction with general anesthesia, the airway was secured with oral endotracheal tube.  An orogastric tube was placed to drain gastric contents.  The tube was then removed and transesophageal transducer was heavily lubricated, placed in a sterile sleeve which was also lubricated and placed blindly down the oropharynx with no resistance.  It remained at the 45-50 cm mark throughout the procedure obtaining various views. At the completion of the case, the probe was removed.  There was no evidence of oropharyngeal damage.  The pre-bypass examination revealed the left ventricle to be mildly concentrically thickened.  There were no segmental defects.  Ejection quality was good.  The left atrium appeared normal size.  The appendage was clean.  There was no evidence of inner atrial septal defects and the mitral valve had 2 leaflets.  There was very minimal calcification of the annulus.  Both leaflets appeared to be freely mobile and coapting within the valvular plane.  Color Doppler revealed no mitral insufficiency.  The aortic valve had 3 leaflets.  The annulus as well as all leaflets were heavily calcified.  There was minimal motion of any leaflet.  Color  Doppler revealed no aortic insufficiency.  The left ventricular outflow sides was measured at 2.7 cm.  Pulmonic valve was normal in appearance.  Swan-Ganz catheter could be noted across the valve.  Color Doppler revealed mild insufficiency flow.  Remainder of the right heart exam was essentially normal.  The patient was placed on cardiopulmonary bypass and underwent an aortic valve replacement with a #25 porcine valve at the completion of the bypass.  There were numerous air bubbles which needed to be cleared with a left ventricular vent. The post-bypass examination revealed the left ventricle to continued to show good contractility, initially left ventricular diameter was markedly diminished secondary to volume which was replaced with continued good showing of left ventricular contractility.  Cardiac outputs were running in the 7 range.  There were again no segmental defects noted.  Left atrium had not changed in appearance with the mitral valve now exhibited to 1+ central regurgitant flow on color Doppler.  The prostatic aortic valve appeared to be well seated.  Color Doppler revealed no paravalvular leaks.  There was no aortic insufficiency.  All leaflets could be seen moving freely and opening and closing appropriately.  The right heart exam was again essentially unchanged  from pre-bypass examination.  There were no new significant findings other than those mentioned above.          ______________________________ Bedelia Person, M.D.     LK/MEDQ  D:  08/22/2010  T:  08/22/2010  Job:  [id number]  Electronically Signed by Bedelia Person M.D. on 09/10/2010 03:30:31 PM

## 2010-09-12 NOTE — Cardiovascular Report (Signed)
  [name], [name]                  ACCOUNT NO.:  [id number]  MEDICAL RECORD NO.:  [id number]           PATIENT TYPE:  O  LOCATION:  [facility]                         FACILITY:  [facility]  PHYSICIAN:  Rollene Rotunda, MD, FACCDATE OF BIRTH:  06-14-1932  DATE OF PROCEDURE:  08/07/2010 DATE OF DISCHARGE:  08/07/2010                           CARDIAC CATHETERIZATION   PRIMARY:  [name], MD  PROCEDURE:  Right and left heart catheterization.  INDICATION:  Evaluate the patient with severe aortic stenosis by echocardiography.  He has also had symptoms of some chest discomfort with exertion which is new.  PROCEDURE NOTE:  Left heart catheterization was performed via the right femoral artery, right heart catheterization was performed via the right femoral vein.  Both vessels were cannulated using an anterior wall puncture.  A #5-French arterial sheath and a #7-French venous sheath were inserted via the Seldinger technique.  Preformed Judkins andpigtail catheter were utilized.  The patient tolerated the procedure well and left the lab in stable condition.  Of note, I did need an Amplatz left catheter with a long straight wire to get across the heavily calcified aortic valve.  RESULTS:  Hemodynamics:  RA mean 8/8, RV 28/3, PA 24/11 with a mean of 17, pulmonary wedge pressure mean 11, LV 159/5, AO 124/65, cardiac output/cardiac index 6.93/3.43, mean gradient 27.1, valve index of 0.7.  Coronaries:  All of the vessels were heavily calcified.  Left main had distal 25% stenosis.  The LAD was calcified.  There was long proximal 40% stenosis.  There was mid 50% stenosis.  There were distal tandem 60% lesions.  First diagonal was small with diffuse 40% stenosis.  Second diagonal was moderate sized and branching with ostial 90% stenosis.  The circumflex in the AV groove had diffuse 25-30% lesions.  There was a large mid obtuse marginal which was branching and had diffuse luminal irregularities.  A  ramus intermediate was large with proximal 90% stenosis.  The right coronary artery was a large dominant vessel with diffuse 25-30% lesions throughout the vessel.  There was a PDA which was moderate sized with distal 90% stenosis.  Left ventriculogram:  Left ventriculogram was obtained in the RAO projection.  The EF was 65% with normal wall motion.  CONCLUSION:  Severe three-vessel coronary artery disease.  Preserved ejection fraction.  Moderately severe aortic stenosis.  PLAN:  Based on the echo and symptoms, I do believe that the aortic valve is severely diseased.  He also has three-vessel coronary artery disease and will need bypass.  I will send him for aortic valve replacement/bypass.     Rollene Rotunda, MD, [facility]     JH/MEDQ  D:  08/07/2010  T:  08/08/2010  Job:  [id number]  cc:   [name], M.D.  Electronically Signed by Rollene Rotunda MD [facility] on 09/12/2010 11:40:34 AM

## 2010-09-19 ENCOUNTER — Other Ambulatory Visit: Payer: Self-pay | Admitting: Thoracic Surgery (Cardiothoracic Vascular Surgery)

## 2010-09-19 DIAGNOSIS — I2510 Atherosclerotic heart disease of native coronary artery without angina pectoris: Secondary | ICD-10-CM

## 2010-09-20 ENCOUNTER — Ambulatory Visit (INDEPENDENT_AMBULATORY_CARE_PROVIDER_SITE_OTHER): Payer: Self-pay | Admitting: Thoracic Surgery (Cardiothoracic Vascular Surgery)

## 2010-09-20 ENCOUNTER — Encounter: Payer: Self-pay | Admitting: Thoracic Surgery (Cardiothoracic Vascular Surgery)

## 2010-09-20 ENCOUNTER — Ambulatory Visit
Admission: RE | Admit: 2010-09-20 | Discharge: 2010-09-20 | Disposition: A | Discharge status: Home / Self Care | Payer: Medicare Other | Source: Ambulatory Visit | Attending: Thoracic Surgery (Cardiothoracic Vascular Surgery) | Admitting: Thoracic Surgery (Cardiothoracic Vascular Surgery)

## 2010-09-20 VITALS — BP 108/64 | HR 82 | Temp 97.0°F | Resp 16

## 2010-09-20 DIAGNOSIS — I2510 Atherosclerotic heart disease of native coronary artery without angina pectoris: Secondary | ICD-10-CM

## 2010-09-20 DIAGNOSIS — Z09 Encounter for follow-up examination after completed treatment for conditions other than malignant neoplasm: Secondary | ICD-10-CM

## 2010-09-20 NOTE — Progress Notes (Signed)
  Mr. Taddei is a 77 year old gentleman presented with a angina he been followed for aortic stenosis for several years an echocardiogram showed progression of his aortic stenosis with valve area 0.57 cm his mean gradient was 58 mm mercury peak gradient was 89 mmHg. Cardiac catheterization revealed three-vessel coronary disease he underwent aortic valve replacement with a 23 mm pericardial valve and coronary bypass grafting x3 on May 30 his postoperative course was unremarkable and he was discharged home on June 4 since then he's been doing well he has been walking 30 minutes daily has not been having to take any pain medication and overall he feels well he did have some redness at his saphenous vein harvest site that has been stable and did not require any treatment.  On physical examination Mr. Millspaugh is a well-appearing 77 year old gentleman in no acute distress neurologically he is alert and oriented x3 his lungs have diminished breath sounds at the left base cardiac exam has a regular rate rhythm normal S1-S2 no rubs, murmurs, or gallops. His sternal wound is clean dry and intact sternum is stable his saphenectomy site has some mild erythema no signs of infection there is 1+ edema in his left leg.  Chest x-ray shows a small left pleural effusion.  Impression Mr. Raineri is doing very well one month out from aortic valve replacement and coronary artery bypass grafting. He is having minimal discomfort his exercise tolerance is excellent.  He does have a small left pleural effusion we are going to treat that with a short prednisone taper in 10 days of Lasix and potassium I will plan to see him back in 3 weeks to check on his progress with a repeat chest x-ray.

## 2010-09-25 NOTE — Discharge Summary (Signed)
NAMEERHARDT, DADA                  ACCOUNT NO.:  [id number]  MEDICAL RECORD NO.:  [id number]           PATIENT TYPE:  I  LOCATION:  2018                         FACILITY:  [facility]  PHYSICIAN:  Salvatore Decent. [name], M.D.DATE OF BIRTH:  01/21/1933  DATE OF ADMISSION:  08/22/2010 DATE OF DISCHARGE:  08/27/2010                              DISCHARGE SUMMARY   HISTORY:  Mr. Seidner is a 77 year old gentleman who has been followed for aortic stenosis for several years.  He had been asymptomatic until approximately a month prior to consultation with Dr. [name] when he had an episode of burning in his chest while mowing his grass.  This was relieved with rest.  It was not particularly associated with shortness of breath.  He denied any other symptoms attributable to that but did note that he has been more tired than normal and fatigued a little more easily than normal recently.  He saw Dr. Christell Constant and Dr. Antoine Poche. Echocardiogram was done which showed progression of his aortic stenosis with a valve area calculated at 0.57 cm2.  Index valve area was 0.47. His mean gradient was 58 mmHg and peak gradient of 89 mmHg.  Dr. Antoine Poche had recommended aortic valve replacement and recommended right and left heart catheterization prior to the procedure.  This was done on Aug 07, 2010.  He was found to have normal PA pressures.  His mean aortic valve gradient was 27.  His valve index was 0.7.  Coronary angiography revealed three-vessel coronary artery disease with a 60% diffusely diseased LAD, 90% ostial in the large second diagonal branch. The ramus intermedius was a large vessel with a 90% stenosis.  Right coronary artery was large and dominant and diffusely diseased.  The only hemodynamically significant stenosis was a distal 90% lesion in the posterior descending.  Ejection fraction was 65%.  He was referred to Dr. [name] who saw the patient on Aug 14, 2010, and agreed with recommendations to  proceed with aortic valve replacement as well as surgical revascularization.  He was admitted this hospitalization for the procedure.  PAST MEDICAL HISTORY:  Significant for: 1. Aortic stenosis.  Other diagnoses include: 2. Hypertension. 3. Hyperlipidemia. 4. Adult-onset diabetes mellitus type 2. 5. Hiatal hernia. 6. History of varicose veins of the right lower leg. 7. History of bilateral total knee replacement for degenerative joint     disease.  MEDICATIONS PRIOR TO ADMISSION: 1. Aspirin 81 mg daily. 2. Iron 325 mg daily. 3. Lipitor 80 mg nightly. 4. Lisinopril 10 mg daily. 5. Metformin 500 mg b.i.d. 6. Polyethylene glycol p.r.n. for constipation.  ALLERGIES:  No known drug allergies.  FAMILY HISTORY:  Significant for cardiac disease.  SOCIAL HISTORY:  He still works in Patent examiner type work.  He has four children.  He is married.  He lives with his wife.  He has never smoked cigarettes.  He has an occasional cigar.  He does not drink alcohol.  REVIEW OF SYMPTOMS AND PHYSICAL EXAMINATION:  Please see the history and physical done at the time of admission.  HOSPITAL COURSE:  The patient was admitted electively and  on Aug 22, 2010, taken to the operating room where he underwent the following procedure, coronary artery bypass grafting x3.  The following grafts were placed.  Left internal mammary artery to the LAD.  Next was a saphenous vein graft to the ramus intermedius.  Next was a saphenous vein graft to the second diagonal.  He had additionally aortic valve replacement with a 23-mm [employer] Ease pericardial valve.  He tolerated the procedure well and was taken to the surgical intensive care unit in stable condition.  POSTOPERATIVE HOSPITAL COURSE:  The patient has done quite well.  He has maintained stable hemodynamics.  He was weaned from the ventilator without difficulty.  He initially did require some atrial pacing but he maintained a normal sinus  rhythm.  He has had all routine lines, monitors and drainage devices discontinued in the standard fashion.  He was weaned from low-dose inotropic support without difficulty.  He was weaned from the ventilator without difficulty.  He does have acute blood loss anemia.  His values have stabilized.  His most recent hemoglobin/hematocrit dated August 25, 2010, show hemoglobin to be 8.6 and hematocrit 26.0.  Electrolytes, BUN and creatinine are within normal limits.  He did have some postoperative thrombocytopenia.  This has improved over time.  His most recent platelet count is 110,000. Incisions are all healing well.  He has been weaned from oxygen and maintains good saturations on room air.  He is tolerating routine advancement activity using standard protocols.  He does have occasional PVCs on the telemetry.  He does have a moderate volume overload and will continue diuresis as an outpatient.  His overall status is felt to be tentatively stable for discharge in the morning of August 27, 2010, pending morning round reevaluation.  MEDICATION AT DISCHARGE: 1. Aspirin 325 mg enteric-coated tablet once daily.2. Lasix 40 mg daily for an additional 10 days. 3. Metoprolol XL 25 mg daily. 4. Oxycodone 5-10 mg q. 4-6 h. p.r.n. 5. Potassium chloride 20 mEq daily for additional 10 days. 6. Crestor 40 mg daily. 7. Folbee Plus vitamin one daily. 8. Metformin 500 mg p.o. b.i.d.  INSTRUCTIONS:  The patient received written instructions regarding medications, activity, diet, wound care and followup.  FOLLOWUP:  Dr. Jenene Slicker office 2 weeks post discharge.  Dr. [name] 3 weeks post discharge.  FINAL DIAGNOSIS:  Severe aortic stenosis and three-vessel coronary artery disease now status post surgical revascularization and aortic valve replacement as described.  OTHER DIAGNOSES:  Postoperative thrombocytopenia, postoperative volume overload, history of hypertension, history of hyperlipidemia, history  of adult-onset diabetes type 2, history of hiatal hernia, history of varicose veins, history of bilateral total knee replacement for degenerative joint disease.     Rowe Clack, P.A.-C.   ______________________________ Salvatore Decent [name], M.D.    Sherryll Burger  D:  08/26/2010  T:  08/26/2010  Job:  [id number]  cc:   Rollene Rotunda, MD, [facility] TCTS Office [name], M.D.  Electronically Signed by Gershon Crane P.A.-C. on 08/30/2010 12:25:14 PM Electronically Signed by Charlett Lango M.D. on 09/25/2010 01:45:52 PM

## 2010-09-25 NOTE — Discharge Summary (Signed)
  [name], [name]                  ACCOUNT NO.:  [id number]  MEDICAL RECORD NO.:  [id number]  LOCATION:  2018                         FACILITY:  [facility]  PHYSICIAN:  Salvatore Decent. [name], M.D.DATE OF BIRTH:  1933-01-16  DATE OF ADMISSION:  08/22/2010 DATE OF DISCHARGE:  08/27/2010                              DISCHARGE SUMMARY   ADDENDUM: The patient's discharge list has been addended and is as follows:  Lasix 40 mg daily for 10 days, Toprol-XL 25 mg daily, oxycodone 5 mg 1-2 every 4-6 hours as needed, potassium chloride 20 mEq daily for 10 days, aspirin 81 mg daily, Folbee plus vitamin one daily, Lipitor 80 mg daily, lisinopril 10 mg daily, metformin 500 mg twice daily.  His discharge summary is otherwise unchanged and he is deemed to be acceptable for discharge on today's date per Dr. [name].     Rowe Clack, P.A.-C.   ______________________________ Salvatore Decent [name], M.D. Sherryll Burger  D:  08/27/2010  T:  08/27/2010  Job:  [id number]  cc:   Rollene Rotunda, MD, [facility] [name], M.D.  Electronically Signed by Gershon Crane P.A.-C. on 08/30/2010 12:25:38 PM Electronically Signed by Charlett Lango M.D. on 09/25/2010 01:45:55 PM

## 2010-09-25 NOTE — Op Note (Signed)
[name], [name]                  ACCOUNT NO.:  [id number]  MEDICAL RECORD NO.:  [id number]           PATIENT TYPE:  I  LOCATION:  2312                         FACILITY:  [facility]  PHYSICIAN:  Salvatore Decent. [name], M.D.DATE OF BIRTH:  October 20, 1932  DATE OF PROCEDURE:  08/22/2010 DATE OF DISCHARGE:                              OPERATIVE REPORT   PREOPERATIVE DIAGNOSIS:  Severe aortic stenosis and three-vessel coronary disease.  POSTOPERATIVE DIAGNOSIS:  Severe aortic stenosis and three-vessel coronary disease.  PROCEDURES:  Median sternotomy, extracorporeal circulation, coronary artery bypass grafting x3 (left internal mammary artery to left anterior descending, saphenous vein graft to ramus intermedius, saphenous vein graft to second diagonal), aortic valve replacement with 23-mm [facility] Ease pericardial valve, model number 3300 TFX, serial number [id number], endoscopic vein harvest left leg.  SURGEON:  Salvatore Decent. [name], MD  ASSISTANT:  Rowe Clack, PA-C  ANESTHESIA:  General.  FINDINGS:  Good-quality coronary targets, LAD diffusely diseased, ramus intermedius intramyocardial, tricuspid calcific stenotic aortic valve, mild annular calcification, good-quality conduits.  Postbypass TEE showed good function of the prosthetic valve with no perivalvular leaks.  CLINICAL NOTE:  [name]  is a 77 year old gentleman with known aortic stenosis that had been followed, he now has developed symptoms. Echocardiogram showed progression of his aortic stenosis with a valve area of 0.57 cm2 and mean gradient was 58 by echo.  At cardiac catheterization, his mean gradient was only 27, his valve index was 0.7. His coronary angiogram revealed three-vessel disease with a diffusely diseased LAD and 90% ostial stenosis in the second diagonal and a 90% stenosis in a large ramus intermedius.  The right coronary was diffusely diseased.  There was a tight stenosis in the posterior descending  which was too distal to bypass.  The patient was advised to undergo coronary artery bypass grafting and aortic valve replacement.  The indications, risks, benefits, and alternatives were discussed in detail with the patient.  We discussed valve options in terms of replacement, primarily the differentiation between tissue and mechanical valves.  Given the patient's age, a tissue valve was recommended, he agreed to proceed.  OPERATIVE NOTE:  [name] was brought to the preop holding area on Aug 22, 2010.  There, the Anesthesia Service placed a Swan-Ganz catheter and arterial blood pressure monitoring line as well as intravenous access. Intravenous antibiotics were administered.  The patient was taken to the operating room, anesthetized, and intubated.  A Foley catheter was placed.  The chest, abdomen, and legs were prepped and draped in usual sterile fashion.  Incision was made in the medial aspect of the left leg at the level of the knee and the greater saphenous vein was harvested endoscopically from the left thigh, was relatively small in the lower thigh and became normal sized higher up in the thigh but overall was a good-quality conduit.  Simultaneously, a median sternotomy was performed.  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  The left internal mammary artery was harvested in standard fashion.  It was a good-quality vessel.  A 2000 units of heparin was administered during the vessel harvest.  Remainder  of the full heparin dose was given prior to opening the pericardium.  After harvesting the conduits, the pericardium was opened and the adequate anticoagulation was confirmed with ACT measurement and ACTs were monitored throughout the bypass portion of the procedure.  After confirming anticoagulation, the ascending aorta was cannulated via concentric 2-0 Ethilon pledgeted pursestring sutures.  A dual-stage venous cannula was placed via pursestring suture in the right  atrial appendage.  Cardiopulmonary bypass was instituted, and the patient was cooled to 28 degrees Celsius.  The coronary arteries were inspected, and anastomotic sites were chosen.  The posterior descending was inspected, the vessel was too small to bypass beyond the distal stenosis.  The conduits were inspected and cut to length.  A left ventricular vent was placed via pursestring suture in the right superior pulmonary vein.  A retrograde cardioplegic cannula was placed via pursestring suture in the right atrium and directed into the coronary sinus and antegrade cardioplegic cannula was placed in the ascending aorta.  The aorta was crossclamped, the left ventricle was emptied via the aortic root vent.  Cardiac arrest then was achieved with combination of cold antegrade blood cardioplegia and retrograde blood cardioplegia and topical iced saline.  An initial 500 mL of cardioplegia was administered antegrade.  There was rapid diastolic arrest.  This was followed by an additional 500 mL of cardioplegia retrograde, which resulted in myocardial septal cooling to less than 10 degrees Celsius.  Additional cardioplegia was administered at the completion of each graft and at 20- minute intervals during the valve replacement portion of the procedure.  A reversed saphenous vein graft was placed end-to-side to the ramus intermedius.  This was a dominant anterolateral branch.  It was a 1.5-mm target site anastomosis, it was intramyocardial, it was of good quality. The vein graft was anastomosed end-to-side with a running 7-0 Prolene suture.  All anastomoses were probed proximally and distally at their completion to ensure patency before tying the suture.  Cardioplegia was administered down each vein graft at its completion to assess flow and hemostasis.  Next, a reverse saphenous vein graft was placed end-to-side to the second diagonal branch of the LAD.  This had a 90% ostial stenosis  that bifurcated in the more lateral branch was grafted.  It was a 1.5-mm vessel at the site, it was of good quality at the site.  The vein was anastomosed end-to-side with running 7-0 Prolene suture.  Again, there was good flow and good hemostasis through this graft as well as the ramus graft.  Next, the left internal mammary artery was brought through a window in the pericardium and distal end was beveled, it was a 2.5-mm good-quality conduit.  It was anastomosed end-to-side to the distal LAD.  The LAD was a 2-mm vessel at the site of the anastomosis.  It was grafted relatively distally because it was diffusely diseased with some calcific plaque more proximally.  The mammary to LAD anastomosis was performed with a running 8-0 Prolene suture.  At the completion of the anastomosis, bulldog clamps were briefly removed to inspect for hemostasis. Immediate rapid septal rewarming was noted, the bulldog clamps were placed, and additional cardioplegia was administered down the vein grafts as well as the retrograde catheter.  Next, an aortotomy was performed just above the sinotubular junction. The aortic valve was inspected as noted on the prebypass TEE.  It was a tricuspid valve.  There was heavy calcification of the leaflets, particularly the free edges of the leaflets and there was  only minimal annular calcification.  The leaflets were excised.  The annulus was debrided, and care was taken to contain all calcific debris.  The annulus was copiously irrigated with iced saline and sized for a 23-mm [employer] Ease bovine pericardial bioprosthetic valve.  A 2-0 Ethibond horizontal mattress sutures with subannular pledgets were placed circumferentially around the annulus, 16 sutures were utilized in all. The valve was prepared per manufacturer's recommendations.  The sutures then were passed through the sewing ring of the valve, the valve was lowered into place, and the sutures were sequentially  tied periodically opening the leaflets to ensure good seating of the valve on the annulus. After the valve had been placed, the annulus was probed with a fine tip right-angle clamp, no gaps were noted.  The coronary ostia were widely patent, and there was no impingement on rewarming was begun.  The aortotomy was closed in two layers, the first was a running 4-0 Prolene horizontal mattress suture, followed by a running 4-0 Prolene simple suture.  The vein grafts were cut to length.  The proximal vein graft anastomoses were performed to 4.5-mm punch aortotomies with running 6-0 Prolene sutures.  At the completion of the final vein graft anastomosis before tying the suture, the patient was placed in Trendelenburg position, de- airing maneuvers were performed.  After de-airing, the heart and aortic root, a warm dose of retrograde cardioplegia was administered and then the aortic crossclamp was removed.  The total crossclamp time was 123 minutes.  The patient spontaneously resumed sinus rhythm and did not require defibrillation.  Of note, CO2 was insufflated into the operative field during the valve replacement portion of the procedure.  While rewarming was completed, all proximal and distal anastomoses and aortotomy were inspect for hemostasis.  Epicardial pacing wires were placed on the right ventricle and right atrium and the patient was rewarmed to a core temperature of 37 degrees Celsius.  He was weaned from cardiopulmonary bypass on the first attempt without difficulty. The total bypass time was 169 minutes.  Initial cardiac index was greater than 3 liters per minute per meter squared, and the patient remained hemodynamically stable throughout the postbypass period.  Test dose of protamine was administered and was well tolerated.  The atrial and aortic cannulae were removed.  The retrograde cardioplegic cannula and vent had been removed prior to weaning from bypass. Postbypass  transesophageal echocardiography revealed preserved left ventricular function.  There was good function of the prosthetic valve with no perivalvular leaks.  Hemostasis was achieved.  Chest was irrigated with warm saline.  The pericardium was reapproximated over the aorta and base of the heart with interrupted 3-0 silk sutures.  A pleural and single mediastinal chest tube were placed through separate subcostal incisions.  The sternum was closed with interrupted heavy gauge double stainless steel wires. Pectoralis fascia, subcutaneous tissue, and skin were closed in standard fashion.  All sponge, needle, and instrument counts were correct at the end of the procedure.  There were no intraoperative complications.  The patient was taken from the operating room to the surgical intensive care unit in good condition.     Salvatore Decent [name], M.D.     SCH/MEDQ  D:  08/22/2010  T:  08/23/2010  Job:  [id number]  cc:   Rollene Rotunda, MD, [facility] [name], M.D.  Electronically Signed by Charlett Lango M.D. on 09/25/2010 01:45:49 PM

## 2010-10-09 ENCOUNTER — Other Ambulatory Visit: Payer: Self-pay | Admitting: Thoracic Surgery (Cardiothoracic Vascular Surgery)

## 2010-10-09 DIAGNOSIS — I2510 Atherosclerotic heart disease of native coronary artery without angina pectoris: Secondary | ICD-10-CM

## 2010-10-10 ENCOUNTER — Ambulatory Visit: Payer: Self-pay | Admitting: Thoracic Surgery (Cardiothoracic Vascular Surgery)

## 2010-10-11 ENCOUNTER — Ambulatory Visit
Admission: RE | Admit: 2010-10-11 | Discharge: 2010-10-11 | Disposition: A | Discharge status: Home / Self Care | Payer: Medicare Other | Source: Ambulatory Visit | Attending: Thoracic Surgery (Cardiothoracic Vascular Surgery) | Admitting: Thoracic Surgery (Cardiothoracic Vascular Surgery)

## 2010-10-11 ENCOUNTER — Other Ambulatory Visit: Payer: Self-pay | Admitting: Thoracic Surgery (Cardiothoracic Vascular Surgery)

## 2010-10-11 ENCOUNTER — Ambulatory Visit (INDEPENDENT_AMBULATORY_CARE_PROVIDER_SITE_OTHER): Payer: Self-pay | Admitting: Thoracic Surgery (Cardiothoracic Vascular Surgery)

## 2010-10-11 DIAGNOSIS — I359 Nonrheumatic aortic valve disorder, unspecified: Secondary | ICD-10-CM

## 2010-10-11 DIAGNOSIS — J90 Pleural effusion, not elsewhere classified: Secondary | ICD-10-CM

## 2010-10-11 DIAGNOSIS — Z9889 Other specified postprocedural states: Secondary | ICD-10-CM

## 2010-10-11 DIAGNOSIS — I2510 Atherosclerotic heart disease of native coronary artery without angina pectoris: Secondary | ICD-10-CM

## 2010-10-12 NOTE — Assessment & Plan Note (Signed)
OFFICE VISIT  [name], [name] DOB:  [birth year]                                        October 11, 2010 CHART #:  [id number]  The patient is a 77 year old gentleman who had coronary artery bypass grafting x3 back on May 30.  He was seen in the office few weeks ago at which time he had a moderate left pleural effusion. He was treated with steroids and diuretics and now returns to check on that.  Overall, he is feeling well.  He is having minimal discomfort.  He is very active.  He wants to be more active, he wants to start mowing his yard.  He is really not having any issues with his breathing.  Denies any orthopnea or cough.  PHYSICAL EXAMINATION:  General:  The patient is a 77 year old gentleman, no acute distress.  Blood pressure 99/59, pulse 96, respirations 18, oxygen saturation 90% on room air.  Lungs:  Diminished breath sounds at the left base with dullness to percussion.  Cardiac:  Regular rate and rhythm.  Normal S1 and S2.  No murmurs, rubs, or gallops.  Skin:  Wounds are well healed.  Extremities:  There is trace peripheral edema.  Chest x-ray shows a slight increase in the size of the left pleural effusion.  IMPRESSION:  The patient is a 77 year old gentleman.  He is now about 6 weeks out from aortic valve replacement, coronary artery bypass grafting x3.  He is overall doing well but does have a left pleural effusion. This has actually gotten slightly bigger despite treatment from his last visit.  I recommended him that we go ahead and do a thoracentesis to drain the fluid and discussed with him the risks of bleeding and pneumothorax.  He accepted the risks and agreed to proceed.  PROCEDURE NOTE:  Using sterile technique and 1% lidocaine local anesthetic, left thoracentesis was performed, 1 L of serous fluid was evacuated.  The patient tolerated the procedure extremely well.  He will be sent for an x-ray.  I will plan to treat [name] with  another steroid taper.  We will repeat his x-ray today and then I will plan to see him back in 3 weeks just to make sure there is no recurrence of the effusion.  Salvatore Decent [name], M.D. Electronically Signed  SCH/MEDQ  D:  10/11/2010  T:  10/12/2010  Job:  [id number]  cc:   Rollene Rotunda, MD, [facility] [name], M.D.

## 2010-10-31 ENCOUNTER — Encounter: Payer: Self-pay | Admitting: Cardiology

## 2010-11-02 ENCOUNTER — Other Ambulatory Visit: Payer: Self-pay | Admitting: Thoracic Surgery (Cardiothoracic Vascular Surgery)

## 2010-11-02 DIAGNOSIS — I2510 Atherosclerotic heart disease of native coronary artery without angina pectoris: Secondary | ICD-10-CM

## 2010-11-02 DIAGNOSIS — I359 Nonrheumatic aortic valve disorder, unspecified: Secondary | ICD-10-CM

## 2010-11-05 ENCOUNTER — Ambulatory Visit (INDEPENDENT_AMBULATORY_CARE_PROVIDER_SITE_OTHER): Payer: Self-pay | Admitting: Thoracic Surgery (Cardiothoracic Vascular Surgery)

## 2010-11-05 ENCOUNTER — Ambulatory Visit
Admission: RE | Admit: 2010-11-05 | Discharge: 2010-11-05 | Disposition: A | Discharge status: Home / Self Care | Payer: Medicare Other | Source: Ambulatory Visit | Attending: Thoracic Surgery (Cardiothoracic Vascular Surgery) | Admitting: Thoracic Surgery (Cardiothoracic Vascular Surgery)

## 2010-11-05 DIAGNOSIS — I359 Nonrheumatic aortic valve disorder, unspecified: Secondary | ICD-10-CM

## 2010-11-05 DIAGNOSIS — I2510 Atherosclerotic heart disease of native coronary artery without angina pectoris: Secondary | ICD-10-CM

## 2010-11-06 NOTE — Assessment & Plan Note (Signed)
OFFICE VISIT  [name], [name] DOB:  [birth year]                                        November 05, 2010 CHART #:  [id number]  HISTORY OF PRESENT ILLNESS:  The patient is a 77 year old gentleman who had coronary bypass grafting x3 on Aug 22, 2010.  Last seen in the office on October 11, 2010, at which time he had moderate left pleural effusion.  This has been treated with diuretics and steroids but did not resolve, so we went ahead and did a thoracentesis.  A liter of fluid was evacuated.  Post evacuation, there was an excellent result.  He now returns for a 3-week followup.  PHYSICAL EXAMINATION:  Vital Signs:  Blood pressure 130/68, pulse 94, respirations 18, ox saturation 98% on room air.  Lungs:  Clear with equal breath sounds bilaterally.  Cardiac:  Regular rate and rhythm. There is a 2/6 systolic murmur.  His sternal wound is clean, dry, and intact.  Extremities:  Leg wounds are healing well.  There is no peripheral edema.  LABORATORY DATA:  Chest x-ray shows good aeration of both lungs with no recurrence of his left pleural effusion.  IMPRESSION:  The patient is a 77 year old gentleman, he is now about 2- 1/2 month out from coronary bypass grafting.  This was complicated by a left pleural effusion that has now resolved and after thoracentesis and with no recurrence in 3 weeks.  No specific further followup is necessary.  He will continue to be followed by Dr. Rudi Heap and Dr. Rollene Rotunda.  I will be happy to see him back at anytime if I can be of any further assistance with his care.  Salvatore Decent [name], M.D. Electronically Signed  SCH/MEDQ  D:  11/05/2010  T:  11/05/2010  Job:  [id number]  cc:   Rollene Rotunda, MD, [facility] [name], M.D.

## 2010-12-31 ENCOUNTER — Encounter: Payer: Self-pay | Admitting: Cardiology

## 2011-01-25 ENCOUNTER — Encounter: Payer: Self-pay | Admitting: Cardiology

## 2011-01-30 ENCOUNTER — Encounter: Payer: Self-pay | Admitting: Cardiology

## 2011-01-30 ENCOUNTER — Ambulatory Visit (INDEPENDENT_AMBULATORY_CARE_PROVIDER_SITE_OTHER): Payer: Medicare Other | Admitting: Cardiology

## 2011-01-30 VITALS — BP 126/60 | HR 72 | Resp 16 | Ht 71.0 in | Wt 173.0 lb

## 2011-01-30 DIAGNOSIS — I359 Nonrheumatic aortic valve disorder, unspecified: Secondary | ICD-10-CM

## 2011-01-30 DIAGNOSIS — I2510 Atherosclerotic heart disease of native coronary artery without angina pectoris: Secondary | ICD-10-CM

## 2011-01-30 DIAGNOSIS — E785 Hyperlipidemia, unspecified: Secondary | ICD-10-CM

## 2011-01-30 DIAGNOSIS — I350 Nonrheumatic aortic (valve) stenosis: Secondary | ICD-10-CM

## 2011-01-30 DIAGNOSIS — Z951 Presence of aortocoronary bypass graft: Secondary | ICD-10-CM | POA: Insufficient documentation

## 2011-01-30 HISTORY — DX: Atherosclerotic heart disease of native coronary artery without angina pectoris: I25.10

## 2011-01-30 NOTE — Assessment & Plan Note (Signed)
The patient has done well with surgery. Of note he does have a murmur. I will follow this up with repeat echocardiograms in the future. He's not having any symptoms. No change in therapy is indicated.

## 2011-01-30 NOTE — Patient Instructions (Signed)
Continue current medications  Follow up with Dr Antoine Poche in 4 months in the [place] office

## 2011-01-30 NOTE — Assessment & Plan Note (Signed)
I reviewed his lipids with an LDL of 51 and HDL of 50. He is at target and he will continue meds as listed.

## 2011-01-30 NOTE — Assessment & Plan Note (Signed)
He is status post CABG. We will continue with aggressive risk reduction.

## 2011-01-30 NOTE — Progress Notes (Signed)
HPI The patient presents for followup of his aortic stenosis s/p aortic valve replacement.  He did pretty well with this surgery. He did have pleural effusion requiring thoracentesis. However, over the last several months he's done well. He's back at work and he exercises. With walking he denies any other chest discomfort that he had earlier this year with exertion. He is not noticing any shortness of breath and he has had no PND or orthopnea. He has had no palpitations, presyncope or syncope. He's had no cough fevers or chills. He has had no weight gain or edema.   No Known Allergies  Current Outpatient Prescriptions  Medication Sig Dispense Refill  . aspirin 81 MG tablet Take 81 mg by mouth daily.        . ferrous sulfate 325 (65 FE) MG tablet Take 325 mg by mouth daily with breakfast.        . LIPITOR 80 MG tablet 1 tab qd      . lisinopril (PRINIVIL,ZESTRIL) 10 MG tablet 1 tab qd      . metFORMIN (GLUCOPHAGE) 500 MG tablet 1 tab twice a day      . metoprolol succinate (TOPROL-XL) 25 MG 24 hr tablet Take 25 mg by mouth daily.        Marland Kitchen oxyCODONE (OXY IR/ROXICODONE) 5 MG immediate release tablet       . polyethylene glycol-electrolytes (NULYTELY) 420 G solution         Past Medical History  Diagnosis Date  . Diabetes mellitus   . Aortic stenosis   . Hyperlipidemia   . Benign prostatic hypertrophy     Past Surgical History  Procedure Date  . Knee replacements     x 2  . Hernia repair   . Aortic valve replacement 08/24/10    23mm pericardial tissue valve  . Coronary artery bypass graft 08/24/10    LIMA to LAD, SVG to ramus intermediate, SVG to diagonal    ROS:  As stated in the HPI and negative for all other systems.  PHYSICAL EXAM BP 126/60  Pulse 72  Resp 16  Ht 5\' 11"  (1.803 m)  Wt 173 lb (78.472 kg)  BMI 24.13 kg/m2 GENERAL:  Well appearing HEENT:  Pupils equal round and reactive, fundi not visualized, oral mucosa unremarkable NECK:  No jugular venous distention,  waveform within normal limits, carotid upstroke brisk and symmetric, no bruits, no thyromegaly LYMPHATICS:  No cervical, inguinal adenopathy LUNGS:  Clear to auscultation bilaterally BACK:  No CVA tenderness CHEST:  Well healed sternotomy scar. HEART:  PMI not displaced or sustained,S1 and S2 within normal limits, no S3, no S4, no clicks, no rubs, apical systolic murmur radiating to the axilla and slightly out the aortic outflow tract ABD:  Flat, positive bowel sounds normal in frequency in pitch, no bruits, no rebound, no guarding, no midline pulsatile mass, no hepatomegaly, no splenomegaly EXT:  2 plus pulses throughout, no edema, no cyanosis no clubbing SKIN:  No rashes no nodules NEURO:  Cranial nerves II through XII grossly intact, motor grossly intact throughout PSYCH:  Cognitively intact, oriented to person place and time   EKG:  Sinus rhythm rate 72, axis within normal limits, intervals within normal limits, no acute ST-T wave changes.  ASSESSMENT AND PLAN

## 2011-05-01 ENCOUNTER — Ambulatory Visit (INDEPENDENT_AMBULATORY_CARE_PROVIDER_SITE_OTHER): Payer: Medicare Other | Admitting: Cardiology

## 2011-05-01 ENCOUNTER — Encounter: Payer: Self-pay | Admitting: Cardiology

## 2011-05-01 DIAGNOSIS — I2510 Atherosclerotic heart disease of native coronary artery without angina pectoris: Secondary | ICD-10-CM

## 2011-05-01 DIAGNOSIS — E785 Hyperlipidemia, unspecified: Secondary | ICD-10-CM

## 2011-05-01 DIAGNOSIS — I359 Nonrheumatic aortic valve disorder, unspecified: Secondary | ICD-10-CM

## 2011-05-01 NOTE — Assessment & Plan Note (Signed)
He is having no further chest discomfort. He will continue with risk reduction.

## 2011-05-01 NOTE — Assessment & Plan Note (Signed)
The patient is doing well. He does have a slight systolic murmur. At this point I will follow this clinically with a repeat echo next year. He will continue the meds as listed.

## 2011-05-01 NOTE — Assessment & Plan Note (Signed)
He is going to call to get an appointment with Dr. Christell Constant. at his lipids drawn soon the goal being LDL less than 70 and HDL greater than 40.

## 2011-05-01 NOTE — Patient Instructions (Addendum)
The current medical regimen is effective;  continue present plan and medications.  Follow up in 1 year with Dr Hochrein.  You will receive a letter in the mail 2 months before you are due.  Please call us when you receive this letter to schedule your follow up appointment.  

## 2011-05-01 NOTE — Progress Notes (Signed)
   HPI The patient presents for followup of his aortic stenosis s/p aortic valve replacement.  Since I last saw him he has done well.  The patient denies any new symptoms such as chest discomfort, neck or arm discomfort. There has been no new shortness of breath, PND or orthopnea. There have been no reported palpitations, presyncope or syncope. He is walking a mile daily.   No Known Allergies  Current Outpatient Prescriptions  Medication Sig Dispense Refill  . aspirin 81 MG tablet Take 81 mg by mouth daily.        . ferrous sulfate 325 (65 FE) MG tablet Take 325 mg by mouth daily with breakfast.        . LIPITOR 80 MG tablet 1 tab qd      . lisinopril (PRINIVIL,ZESTRIL) 10 MG tablet 1 tab qd      . metFORMIN (GLUCOPHAGE) 500 MG tablet 1 tab twice a day      . metoprolol succinate (TOPROL-XL) 25 MG 24 hr tablet Take 25 mg by mouth daily.        Marland Kitchen oxyCODONE (OXY IR/ROXICODONE) 5 MG immediate release tablet       . polyethylene glycol-electrolytes (NULYTELY) 420 G solution         Past Medical History  Diagnosis Date  . Diabetes mellitus   . Aortic stenosis   . Hyperlipidemia   . Benign prostatic hypertrophy     Past Surgical History  Procedure Date  . Knee replacements     x 2  . Hernia repair   . Aortic valve replacement 08/24/10    23mm pericardial tissue valve  . Coronary artery bypass graft 08/24/10    LIMA to LAD, SVG to ramus intermediate, SVG to diagonal    ROS:  As stated in the HPI and negative for all other systems.  PHYSICAL EXAM BP 100/70  Ht 5\' 11"  (1.803 m)  Wt 164 lb (74.39 kg)  BMI 22.87 kg/m2 GENERAL:  Well appearing HEENT:  Pupils equal round and reactive, fundi not visualized, oral mucosa unremarkable NECK:  No jugular venous distention, waveform within normal limits, carotid upstroke brisk and symmetric, no bruits, no thyromegaly LYMPHATICS:  No cervical, inguinal adenopathy LUNGS:  Clear to auscultation bilaterally BACK:  No CVA tenderness CHEST:  Well  healed sternotomy scar. HEART:  PMI not displaced or sustained,S1 and S2 within normal limits, no S3, no S4, no clicks, no rubs, apical systolic murmur radiating to the axilla and slightly out the aortic outflow tract ABD:  Flat, positive bowel sounds normal in frequency in pitch, no bruits, no rebound, no guarding, no midline pulsatile mass, no hepatomegaly, no splenomegaly EXT:  2 plus pulses throughout, no edema, no cyanosis no clubbing SKIN:  No rashes no nodules NEURO:  Cranial nerves II through XII grossly intact, motor grossly intact throughout  ASSESSMENT AND PLAN

## 2011-06-05 ENCOUNTER — Encounter: Payer: Self-pay | Admitting: Cardiology

## 2011-07-24 ENCOUNTER — Emergency Department (HOSPITAL_COMMUNITY): Payer: Medicare Other

## 2011-07-24 ENCOUNTER — Observation Stay (HOSPITAL_COMMUNITY)
Admission: EM | Admit: 2011-07-24 | Discharge: 2011-07-25 | Disposition: A | Discharge status: Home / Self Care | Payer: Medicare Other | Attending: Cardiology | Admitting: Cardiology

## 2011-07-24 ENCOUNTER — Encounter (HOSPITAL_COMMUNITY): Payer: Self-pay | Admitting: *Deleted

## 2011-07-24 DIAGNOSIS — Z954 Presence of other heart-valve replacement: Secondary | ICD-10-CM | POA: Insufficient documentation

## 2011-07-24 DIAGNOSIS — D649 Anemia, unspecified: Secondary | ICD-10-CM | POA: Insufficient documentation

## 2011-07-24 DIAGNOSIS — R079 Chest pain, unspecified: Principal | ICD-10-CM | POA: Insufficient documentation

## 2011-07-24 DIAGNOSIS — I359 Nonrheumatic aortic valve disorder, unspecified: Secondary | ICD-10-CM | POA: Insufficient documentation

## 2011-07-24 DIAGNOSIS — Z951 Presence of aortocoronary bypass graft: Secondary | ICD-10-CM | POA: Diagnosis present

## 2011-07-24 DIAGNOSIS — I2510 Atherosclerotic heart disease of native coronary artery without angina pectoris: Secondary | ICD-10-CM | POA: Insufficient documentation

## 2011-07-24 DIAGNOSIS — R61 Generalized hyperhidrosis: Secondary | ICD-10-CM | POA: Insufficient documentation

## 2011-07-24 DIAGNOSIS — E785 Hyperlipidemia, unspecified: Secondary | ICD-10-CM | POA: Insufficient documentation

## 2011-07-24 DIAGNOSIS — N400 Enlarged prostate without lower urinary tract symptoms: Secondary | ICD-10-CM | POA: Insufficient documentation

## 2011-07-24 DIAGNOSIS — Z8679 Personal history of other diseases of the circulatory system: Secondary | ICD-10-CM | POA: Insufficient documentation

## 2011-07-24 DIAGNOSIS — R110 Nausea: Secondary | ICD-10-CM | POA: Insufficient documentation

## 2011-07-24 DIAGNOSIS — R0789 Other chest pain: Secondary | ICD-10-CM

## 2011-07-24 DIAGNOSIS — Z79899 Other long term (current) drug therapy: Secondary | ICD-10-CM | POA: Insufficient documentation

## 2011-07-24 DIAGNOSIS — I200 Unstable angina: Secondary | ICD-10-CM

## 2011-07-24 DIAGNOSIS — E119 Type 2 diabetes mellitus without complications: Secondary | ICD-10-CM | POA: Insufficient documentation

## 2011-07-24 HISTORY — DX: Unstable angina: I20.0

## 2011-07-24 HISTORY — DX: Unspecified osteoarthritis, unspecified site: M19.90

## 2011-07-24 HISTORY — DX: Atherosclerotic heart disease of native coronary artery without angina pectoris: I25.10

## 2011-07-24 LAB — CBC
HCT: 34.3 % — ABNORMAL LOW (ref 39.0–52.0)
Hemoglobin: 11.7 g/dL — ABNORMAL LOW (ref 13.0–17.0)
MCH: 30.7 pg (ref 26.0–34.0)
MCHC: 34.1 g/dL (ref 30.0–36.0)
RDW: 12.8 % (ref 11.5–15.5)

## 2011-07-24 LAB — POCT I-STAT, CHEM 8
Calcium, Ion: 1.18 mmol/L (ref 1.12–1.32)
Glucose, Bld: 142 mg/dL — ABNORMAL HIGH (ref 70–99)
HCT: 33 % — ABNORMAL LOW (ref 39.0–52.0)
Hemoglobin: 11.2 g/dL — ABNORMAL LOW (ref 13.0–17.0)
TCO2: 26 mmol/L (ref 0–100)

## 2011-07-24 LAB — DIFFERENTIAL
Basophils Absolute: 0.1 10*3/uL (ref 0.0–0.1)
Basophils Relative: 1 % (ref 0–1)
Eosinophils Absolute: 0.5 10*3/uL (ref 0.0–0.7)
Eosinophils Relative: 7 % — ABNORMAL HIGH (ref 0–5)
Monocytes Absolute: 0.6 10*3/uL (ref 0.1–1.0)
Monocytes Relative: 8 % (ref 3–12)

## 2011-07-24 MED ORDER — ASPIRIN 81 MG PO CHEW
324.0000 mg | CHEWABLE_TABLET | Freq: Once | ORAL | Status: AC
Start: 2011-07-24 — End: 2011-07-24
  Administered 2011-07-24: 324 mg via ORAL
  Filled 2011-07-24: qty 4

## 2011-07-24 MED ORDER — METFORMIN HCL 500 MG PO TABS
500.0000 mg | ORAL_TABLET | Freq: Two times a day (BID) | ORAL | Status: DC
  Administered 2011-07-25: 500 mg via ORAL
  Filled 2011-07-24 (×3): qty 1

## 2011-07-24 MED ORDER — LISINOPRIL 10 MG PO TABS
10.0000 mg | ORAL_TABLET | Freq: Every day | ORAL | Status: DC
  Administered 2011-07-25: 10 mg via ORAL
  Filled 2011-07-24: qty 1

## 2011-07-24 MED ORDER — NITROGLYCERIN 0.4 MG SL SUBL
0.4000 mg | SUBLINGUAL_TABLET | SUBLINGUAL | Status: DC | PRN
  Administered 2011-07-24 (×3): 0.4 mg via SUBLINGUAL

## 2011-07-24 MED ORDER — ATORVASTATIN CALCIUM 80 MG PO TABS
80.0000 mg | ORAL_TABLET | Freq: Every day | ORAL | Status: DC
  Administered 2011-07-25: 80 mg via ORAL
  Filled 2011-07-24: qty 1

## 2011-07-24 MED ORDER — HEPARIN SODIUM (PORCINE) 5000 UNIT/ML IJ SOLN
5000.0000 [IU] | Freq: Three times a day (TID) | INTRAMUSCULAR | Status: DC
  Administered 2011-07-25 (×2): 5000 [IU] via SUBCUTANEOUS
  Filled 2011-07-24 (×5): qty 1

## 2011-07-24 MED ORDER — METOPROLOL SUCCINATE ER 25 MG PO TB24
25.0000 mg | ORAL_TABLET | Freq: Every day | ORAL | Status: DC
  Administered 2011-07-25: 25 mg via ORAL
  Filled 2011-07-24: qty 1

## 2011-07-24 MED ORDER — FERROUS SULFATE 325 (65 FE) MG PO TABS
325.0000 mg | ORAL_TABLET | Freq: Every day | ORAL | Status: DC
  Administered 2011-07-25: 325 mg via ORAL
  Filled 2011-07-24 (×2): qty 1

## 2011-07-24 NOTE — ED Notes (Signed)
Pt c/o epigastric buring, moved in to/became CP, improved with belching, onset ~ 25 minutes PTA, drove POV into ambulance bay area, brought into ED by EMS on stretcher, no care PTA, states, "pain better than initially", "feels better", arrives alert, NAD, calm, interactive, belching, skin W&D, resps e/u, placed on stretcher in hw, moved to room POD A 1. EKG being done.

## 2011-07-24 NOTE — ED Notes (Signed)
EDP into room, pt reports "feels better", remains alert, NAD, calm, interactive, wife at [facility], NSR on monitor, VSS/WNL.

## 2011-07-24 NOTE — ED Provider Notes (Signed)
History     CSN: [id number]  Arrival date & time 07/24/11  2026   First MD Initiated Contact with Patient 07/24/11 2041      Chief Complaint  Patient presents with  . Abdominal Pain  . Chest Pain    (Consider location/radiation/quality/duration/timing/severity/associated sxs/prior treatment) HPI.... chest pain approximately 1 hour ago. Also complains of nausea, diaphoresis.  No dyspnea. Pain is described as burning. Has post CABG x3 vessels in 2012 with questionable aortic valve replacement.  RN gave 3 nitroglycerin tablets and aspirin which seemed to help pain. Chest pain is much reduced at this time. Described as moderate. No radiation of pain.  Past Medical History  Diagnosis Date  . Diabetes mellitus   . Aortic stenosis   . Hyperlipidemia   . Benign prostatic hypertrophy   . Coronary artery disease     Past Surgical History  Procedure Date  . Knee replacements     x 2  . Hernia repair   . Aortic valve replacement 08/24/10    23mm pericardial tissue valve  . Coronary artery bypass graft 08/24/10    LIMA to LAD, SVG to ramus intermediate, SVG to diagonal    Family History  Problem Relation Age of Onset  . Hypertension Other   . Diabetes Other     History  Substance Use Topics  . Smoking status: Former Smoker    Types: Cigars  . Smokeless tobacco: Not on file  . Alcohol Use: Not on file      Review of Systems  All other systems reviewed and are negative.    Allergies  Review of patient's allergies indicates no known allergies.  Home Medications   Current Outpatient Rx  Name Route Sig Dispense Refill  . ASPIRIN 81 MG PO TABS Oral Take 81 mg by mouth daily.      Marland Kitchen FERROUS SULFATE 325 (65 FE) MG PO TABS Oral Take 325 mg by mouth daily with breakfast.      . LIPITOR 80 MG PO TABS  1 tab qd    . LISINOPRIL 10 MG PO TABS  1 tab qd    . METFORMIN HCL 500 MG PO TABS  1 tab twice a day    . METOPROLOL SUCCINATE ER 25 MG PO TB24 Oral Take 25 mg by mouth daily.         BP 121/66  Pulse 67  Temp(Src) 97.8 F (36.6 C) (Oral)  Resp 18  SpO2 100%  Physical Exam  Nursing note and vitals reviewed. Constitutional: He is oriented to person, place, and time. He appears well-developed and well-nourished.  HENT:  Head: Normocephalic and atraumatic.  Eyes: Conjunctivae and EOM are normal. Pupils are equal, round, and reactive to light.  Neck: Normal range of motion. Neck supple.  Cardiovascular: Normal rate and regular rhythm.   Pulmonary/Chest: Effort normal and breath sounds normal.  Abdominal: Soft. Bowel sounds are normal.  Musculoskeletal: Normal range of motion.  Neurological: He is alert and oriented to person, place, and time.  Skin: Skin is warm and dry.  Psychiatric: He has a normal mood and affect.    ED Course  Procedures (including critical care time)  Labs Reviewed  CBC - Abnormal; Notable for the following:    RBC 3.81 (*)    Hemoglobin 11.7 (*)    HCT 34.3 (*)    All other components within normal limits  DIFFERENTIAL - Abnormal; Notable for the following:    Eosinophils Relative 7 (*)  All other components within normal limits  POCT I-STAT, CHEM 8 - Abnormal; Notable for the following:    Glucose, Bld 142 (*)    Hemoglobin 11.2 (*)    HCT 33.0 (*)    All other components within normal limits  POCT I-STAT TROPONIN I   Dg Chest Port 1 View  07/24/2011  *RADIOLOGY REPORT*  Clinical Data: Chest pain  PORTABLE CHEST - 1 VIEW  Comparison: 11/05/2010; 10/11/2010; 09/20/2010  Findings: Grossly unchanged cardiac silhouette and mediastinal contours post median sternotomy and CABG given slightly decreased lung volumes.  There is persistent blunting of the left costophrenic angle which may suggest a residual small left-sided effusion/pleural parenchymal thickening.  Grossly unchanged left basilar heterogeneous opacities favored to represent atelectasis or scar.  No new focal airspace opacities.  No definite evidence of pulmonary  edema.  No pneumothorax.  Grossly unchanged bones.  IMPRESSION:  1.  Decreased lung volumes without acute cardiopulmonary disease. 2.  Grossly unchanged with the left costophrenic angle favored to represent either residual small left-sided effusion and/or pleural parenchymal thickening.  Original Report Authenticated By: Waynard Reeds, M.D.     No diagnosis found.   Date: 07/24/2011  Rate: 75  Rhythm: normal sinus rhythm  QRS Axis: normal  Intervals: normal  ST/T Wave abnormalities: normal  Conduction Disutrbances: none  Narrative Interpretation: unremarkable     MDM  Patient has significant chest pain. Has post CABG 1 year ago. Discussed with cardiologist. Will admit        Donnetta Hutching, MD 07/24/11 2332

## 2011-07-24 NOTE — ED Notes (Signed)
Denies pain after 3rd ntg.

## 2011-07-24 NOTE — ED Notes (Signed)
Card MD into room.

## 2011-07-24 NOTE — ED Notes (Signed)
C/o CP, mentions one episode of intermitant nausea, also near syncope when transferring from stretcher to stretcher (subjective), (denies: sob, dizziness, fever, nvd, bleeding, sob, radiation syncope, dizziness or other sx). Appears generally weak.

## 2011-07-25 ENCOUNTER — Encounter (HOSPITAL_COMMUNITY): Payer: Self-pay | Admitting: General Practice

## 2011-07-25 ENCOUNTER — Observation Stay (HOSPITAL_COMMUNITY): Payer: Medicare Other

## 2011-07-25 DIAGNOSIS — R079 Chest pain, unspecified: Secondary | ICD-10-CM

## 2011-07-25 LAB — CBC
Hemoglobin: 11.9 g/dL — ABNORMAL LOW (ref 13.0–17.0)
Platelets: 155 10*3/uL (ref 150–400)
RBC: 3.8 MIL/uL — ABNORMAL LOW (ref 4.22–5.81)
WBC: 6.5 10*3/uL (ref 4.0–10.5)

## 2011-07-25 LAB — CARDIAC PANEL(CRET KIN+CKTOT+MB+TROPI)
CK, MB: 3.4 ng/mL (ref 0.3–4.0)
CK, MB: 3.8 ng/mL (ref 0.3–4.0)
Relative Index: INVALID (ref 0.0–2.5)
Total CK: 54 U/L (ref 7–232)
Troponin I: <0.3 ng/mL (ref <0.30)
Troponin I: <0.3 ng/mL (ref <0.30)

## 2011-07-25 LAB — GLUCOSE, CAPILLARY
Glucose-Capillary: 118 mg/dL — ABNORMAL HIGH (ref 70–99)
Glucose-Capillary: 141 mg/dL — ABNORMAL HIGH (ref 70–99)
Glucose-Capillary: 99 mg/dL (ref 70–99)

## 2011-07-25 LAB — CREATININE, SERUM
Creatinine, Ser: 0.81 mg/dL (ref 0.50–1.35)
GFR calc non Af Amer: 84 mL/min — ABNORMAL LOW (ref >90)

## 2011-07-25 MED ORDER — NITROGLYCERIN 0.4 MG SL SUBL: 0.4000 mg | SUBLINGUAL_TABLET | SUBLINGUAL | Status: DC | PRN

## 2011-07-25 MED ORDER — TECHNETIUM TC 99M TETROFOSMIN IV KIT
30.0000 | PACK | Freq: Once | INTRAVENOUS | Status: AC | PRN
  Administered 2011-07-25: 30 via INTRAVENOUS

## 2011-07-25 MED ORDER — ASPIRIN 81 MG PO CHEW
324.0000 mg | CHEWABLE_TABLET | ORAL | Status: AC
  Administered 2011-07-25: 324 mg via ORAL

## 2011-07-25 MED ORDER — ACETAMINOPHEN 325 MG PO TABS: 650.0000 mg | ORAL_TABLET | ORAL | Status: DC | PRN

## 2011-07-25 MED ORDER — ONDANSETRON HCL 4 MG/2ML IJ SOLN: 4.0000 mg | Freq: Four times a day (QID) | INTRAMUSCULAR | Status: DC | PRN

## 2011-07-25 MED ORDER — ASPIRIN EC 81 MG PO TBEC: 81.0000 mg | DELAYED_RELEASE_TABLET | Freq: Every day | ORAL | Status: DC

## 2011-07-25 MED ORDER — INSULIN ASPART 100 UNIT/ML ~~LOC~~ SOLN
0.0000 [IU] | Freq: Three times a day (TID) | SUBCUTANEOUS | Status: DC
  Administered 2011-07-25: 2 [IU] via SUBCUTANEOUS

## 2011-07-25 MED ORDER — INSULIN ASPART 100 UNIT/ML ~~LOC~~ SOLN: 0.0000 [IU] | Freq: Every day | SUBCUTANEOUS | Status: DC

## 2011-07-25 MED ORDER — ASPIRIN 300 MG RE SUPP
300.0000 mg | RECTAL | Status: AC
  Filled 2011-07-25: qty 1

## 2011-07-25 MED ORDER — TECHNETIUM TC 99M TETROFOSMIN IV KIT
10.0000 | PACK | Freq: Once | INTRAVENOUS | Status: AC | PRN
  Administered 2011-07-25: 10 via INTRAVENOUS

## 2011-07-25 NOTE — Progress Notes (Signed)
Patient ID: [name], male   DOB: [birth year], 78 y.o.   MRN: [id number]    SUBJECTIVE: No further chest pain.      [name] aspirin  324 mg Oral Once  . aspirin  324 mg Oral NOW   Or  . aspirin  300 mg Rectal NOW  . aspirin EC  81 mg Oral Daily  . atorvastatin  80 mg Oral Daily  . ferrous sulfate  325 mg Oral Q breakfast  . heparin  5,000 Units Subcutaneous Q8H  . insulin aspart  0-15 Units Subcutaneous TID WC  . insulin aspart  0-5 Units Subcutaneous QHS  . lisinopril  10 mg Oral Daily  . metFORMIN  500 mg Oral BID WC  . metoprolol succinate  25 mg Oral Daily      Filed Vitals:   07/24/11 2352 07/25/11 0030 07/25/11 0100 07/25/11 0500  BP: 94/50 110/68 121/72 107/66  Pulse: 60 73 56 58  Temp: 97.8 F (36.6 C)  97.9 F (36.6 C) 97.7 F (36.5 C)  TempSrc: Oral  Oral   Resp: 20 25 18 20   Height:   5\' 11"  (1.803 m)   Weight:   177 lb 0.5 oz (80.3 kg)   SpO2: 100% 100% 97% 97%   No intake or output data in the 24 hours ending 07/25/11 0732  LABS: Basic Metabolic Panel:  Basename 07/25/11 0513 07/24/11 2116  NA -- 139  K -- 3.9  CL -- 105  CO2 -- --  GLUCOSE -- 142*  BUN -- 17  CREATININE 0.81 0.90  CALCIUM -- --  MG -- --  PHOS -- --   Liver Function Tests: No results found for this basename: AST:2,ALT:2,ALKPHOS:2,BILITOT:2,PROT:2,ALBUMIN:2 in the last 72 hours No results found for this basename: LIPASE:2,AMYLASE:2 in the last 72 hours CBC:  Basename 07/25/11 0513 07/24/11 2116 07/24/11 2059  WBC 6.5 -- 7.3  NEUTROABS -- -- 4.1  HGB 11.9* 11.2* --  HCT 34.1* 33.0* --  MCV 89.7 -- 90.0  PLT 155 -- 169   Cardiac Enzymes:  Basename 07/25/11 0516 07/24/11 2357  CKTOTAL 54 62  CKMB 3.4 3.8  CKMBINDEX -- --  TROPONINI <0.30 <0.30   BNP: No components found with this basename: POCBNP:3 D-Dimer: No results found for this basename: DDIMER:2 in the last 72 hours Hemoglobin A1C: No results found for this basename: HGBA1C in the last 72 hours Fasting  Lipid Panel: No results found for this basename: CHOL,HDL,LDLCALC,TRIG,CHOLHDL,LDLDIRECT in the last 72 hours Thyroid Function Tests: No results found for this basename: TSH,T4TOTAL,FREET3,T3FREE,THYROIDAB in the last 72 hours Anemia Panel: No results found for this basename: VITAMINB12,FOLATE,FERRITIN,TIBC,IRON,RETICCTPCT in the last 72 hours  RADIOLOGY: Dg Chest Port 1 View  07/24/2011  *RADIOLOGY REPORT*  Clinical Data: Chest pain  PORTABLE CHEST - 1 VIEW  Comparison: 11/05/2010; 10/11/2010; 09/20/2010  Findings: Grossly unchanged cardiac silhouette and mediastinal contours post median sternotomy and CABG given slightly decreased lung volumes.  There is persistent blunting of the left costophrenic angle which may suggest a residual small left-sided effusion/pleural parenchymal thickening.  Grossly unchanged left basilar heterogeneous opacities favored to represent atelectasis or scar.  No new focal airspace opacities.  No definite evidence of pulmonary edema.  No pneumothorax.  Grossly unchanged bones.  IMPRESSION:  1.  Decreased lung volumes without acute cardiopulmonary disease. 2.  Grossly unchanged with the left costophrenic angle favored to represent either residual small left-sided effusion and/or pleural parenchymal thickening.  Original Report Authenticated By: Waynard Reeds,  M.D.    PHYSICAL EXAM General: NAD Neck: No JVD, no thyromegaly or thyroid nodule.  Lungs: Clear to auscultation bilaterally with normal respiratory effort. CV: Nondisplaced PMI.  Heart regular S1/S2, no S3/S4, 2/6 SEM RUSB.  No peripheral edema.  No carotid bruit.  Normal pedal pulses.  Abdomen: Soft, nontender, no hepatosplenomegaly, no distention.  Neurologic: Alert and oriented x 3.  Psych: Normal affect. Extremities: No clubbing or cyanosis.   TELEMETRY: Reviewed telemetry pt in NSR  ASSESSMENT AND PLAN:  78 yo with history of CABG-bioprosthetic AVR in 2012 presented with lower chest/upper abdominal  pain while walking.  He has ruled out for MI and ECG is unchanged. It is possible that the pain he had was GI-related, but given h/o CAD and the fact that it occurred with exertion, needs further workup.  I will plan ETT-myoview this am.  If this is normal, he can go home tonight.   [name] 07/25/2011 7:37 AM

## 2011-07-25 NOTE — Progress Notes (Signed)
Pt discharged to home per MD order. Pt received all discharge instructions including prescriptions and follow-up appointment information.  Pt alert and oriented at discharge with no complaints of pain.  Pt escorted to exit via wheelchair.  [name]

## 2011-07-25 NOTE — Discharge Summary (Signed)
Discharge Summary   Patient ID: [name] MRN: [id number], DOB/AGE: 27-Jan-1933 78 y.o. Admit date: 07/24/2011 D/C date:     07/25/2011   Primary Discharge Diagnoses:  1. Chest pain possibly GI in nature with normal treadmill Myoview 07/25/11, EF 72% 2. Mild anemia  Secondary Discharge Diagnoses:  1. Coronary artery disease s/p CABG 08/2010  2. Aortic stenosis, s/p pericardial AVR 23-mmHg 3. Dyslipidemia  4. Diabetes 5. BPH   Hospital Course: 78 y/o M with a history of CAD s/p CABG, AVR presented to [facility] with complaints of chest discomfort lasting an hourt he evening of admission. He was out doing his usual evening walk when he developed heaviness below his chest and upper abdomen. He did not have any associated shortness of breath, but he did have some mild nausea and diaphoresis. He had not had any similar episodes recently. He denied any palpitations, lightheadedness, dizziness, PND, orthopnea or LEE. EKG shows sinus rhythm with a rate of 75 beats per minute, no acute changes from his old EKG. His pain was felt somewhat atypical but given his history he was admitted to the hospital for observation. CE's were negative for MI. He underwent exercise treadmill stress testing today that was normal. He feels well without symptoms. Dr. Shirlee Latch felt his sx may have been GI in nature. The patient was seen and examined today and felt stable for discharge by Dr. Shirlee Latch. Of note he is mildly anemic and was instructed to f/u with his primary doctor for this.   Discharge Vitals: Blood pressure 121/72, pulse 75, temperature 97.7 F (36.5 C), temperature source Oral, resp. rate 17, height 5\' 11"  (1.803 m), weight 177 lb 0.5 oz (80.3 kg), SpO2 97.00%.  Labs: Lab Results  Component Value Date   WBC 6.5 07/25/2011   HGB 11.9* 07/25/2011   HCT 34.1* 07/25/2011   MCV 89.7 07/25/2011   PLT 155 07/25/2011     Lab 07/25/11 0513 07/24/11 2116  NA -- 139  K -- 3.9  CL -- 105  CO2 -- --  BUN -- 17    CREATININE 0.81 --  CALCIUM -- --  PROT -- --  BILITOT -- --  ALKPHOS -- --  ALT -- --  AST -- --  GLUCOSE -- 142*    Basename 07/25/11 1259 07/25/11 0516 07/24/11 2357  CKTOTAL 46 54 62  CKMB 3.0 3.4 3.8  TROPONINI <0.30 <0.30 <0.30    Diagnostic Studies/Procedures   1. NM Myocar Multi W/spect W/wall Motion / Ef 07/25/2011  Patient underwent a treadmill Myoview for evaluation of chest discomfort.  EKG at rest is normal. With stress patient develops minor nonspecific STT changes.  Patient exercised well on treadmill and achieved heart rate of 136 which represents 94% of maximal age-predicted heart rate. Patient did not experience any chest pain with exercise.  The quality of the images is satisfactory.  Perfusion imaging shows no evidence of ischemia or scar at rest or with stress.  EDV is 82ml.  ESV is 23 ml.  EF is 72%.  There are no wall motion abnormalities.  Impression: Normal treadmill Myoview stress test.  No evidence of ischemia or scar. Normal EF 72%  Cassell Clement MD  Original Report Authenticated By: Heriberto Antigua   2. Chest Port 1 View 07/24/2011  *RADIOLOGY REPORT*  Clinical Data: Chest pain  PORTABLE CHEST - 1 VIEW  Comparison: 11/05/2010; 10/11/2010; 09/20/2010  Findings: Grossly unchanged cardiac silhouette and mediastinal contours post median sternotomy and CABG given  slightly decreased lung volumes.  There is persistent blunting of the left costophrenic angle which may suggest a residual small left-sided effusion/pleural parenchymal thickening.  Grossly unchanged left basilar heterogeneous opacities favored to represent atelectasis or scar.  No new focal airspace opacities.  No definite evidence of pulmonary edema.  No pneumothorax.  Grossly unchanged bones.  IMPRESSION:  1.  Decreased lung volumes without acute cardiopulmonary disease. 2.  Grossly unchanged with the left costophrenic angle favored to represent either residual small left-sided effusion and/or pleural parenchymal  thickening.  Original Report Authenticated By: Waynard Reeds, M.D.   Discharge Medications   Medication List  As of 07/25/2011  3:34 PM   TAKE these medications         aspirin 81 MG tablet   Take 81 mg by mouth daily.      atorvastatin 80 MG tablet   Commonly known as: LIPITOR   Take 80 mg by mouth daily.      ferrous sulfate 325 (65 FE) MG tablet   Take 325 mg by mouth daily with breakfast.      lisinopril 10 MG tablet   Commonly known as: PRINIVIL,ZESTRIL   Take 10 mg by mouth daily.      metFORMIN 500 MG tablet   Commonly known as: GLUCOPHAGE   1 tab twice a day      metoprolol succinate 25 MG 24 hr tablet   Commonly known as: TOPROL-XL   Take 25 mg by mouth daily.      nitroGLYCERIN 0.4 MG SL tablet   Commonly known as: NITROSTAT   Place 1 tablet (0.4 mg total) under the tongue every 5 (five) minutes x 3 doses as needed for chest pain.            Disposition   The patient will be discharged in stable condition to home. Discharge Orders    Future Appointments: Provider: Department: Dept Phone: Center:   08/22/2011 3:15 PM Rollene Rotunda, MD [facility] [phone] [facility]     Future Orders Please Complete By Expires   Diet - low sodium heart healthy      Increase activity slowly        Follow-up Information    Follow up with Rollene Rotunda, MD. (08/22/11 at 3:15pm)    Contact information:   [address], [address] [place] [address] [phone]       Follow up with Primary Care Doctor. (Please follow up with your primary doctor to discuss other causes of your chest pain. You were also mildly anemic and this will need to be followed)           Duration of Discharge Encounter: Greater than 30 minutes including physician and PA time.  Signed, Ronie Spies PA-C 07/25/2011, 3:34 PM

## 2011-07-25 NOTE — Progress Notes (Signed)
UR Completed.  Waseem Suess Jane [phone] 07/25/2011  

## 2011-07-25 NOTE — H&P (Signed)
[name], [name] NO.:  [id number]  MEDICAL RECORD NO.:  [id number]  LOCATION:  [id number]                        FACILITY:  [facility]  PHYSICIAN:  Natasha Bence, MD       DATE OF BIRTH:  08/29/1932  DATE OF ADMISSION:  07/24/2011 DATE OF DISCHARGE:                             HISTORY & PHYSICAL   PRIMARY CARDIOLOGIST:  Rollene Rotunda, MD, [facility]  CHIEF COMPLAINT:  Chest discomfort.  HISTORY OF PRESENT ILLNESS:  [name] is a 78 year old white male with a history of coronary artery disease, status post coronary artery bypass grafting last year and aortic valve replacement who presents with chest discomfort, lasted approximately an hour this evening.  He reports he was out doing his usual evening walk, approximately 1 mile, when he developed heaviness blow his chest and his upper abdomen.  This pain did not radiate and lasted approximately 45 minutes to an hour and was not relieved until he got 3 nitroglycerin in the ED.  He is currently chest discomfort free.  He did not have any associated shortness of breath, but he did have some mild nausea and diaphoresis.  He has not had any recent episodes similar to this.  Otherwise, he denies any palpitations, lightheadedness, or dizziness.  He has not had any episodes of syncope. He denies any recent PND, orthopnea, or lower extremity edema.  The chest discomfort was not aggravated by anything in particular.  There was nothing that seemed to make it better besides the nitroglycerin.  OTHER REVIEW OF SYSTEMS:  He denies any fevers, chills, or sweats.  No cough.  No shortness of breath.  He denies any weight loss or weight gain.  He has not had any recent problems with melena or hematochezia. He denies any types of bleeding.  Otherwise, complete review of systems was performed and was negative except for what was stated above.  PAST MEDICAL HISTORY: 1. Coronary artery disease, status post CABG on 08/24/2010.  He had a  LIMA to an LAD, SVG to the ramus intermedius, and SVG to diagonal     branch. 2. Aortic stenosis, status post aortic valve replacement with a 23-mm     pericardial tissue valve. 3. Dyslipidemia. 4. Diabetes. 5. BPH.  PAST SURGICAL HISTORY:  He has had a CABG x3, aortic valve replacement with pericardial tissue valve, hernia repair, and 2 knee surgeries.  FAMILY HISTORY:  No coronary artery disease.  He has a history of hypertension and diabetes.  SOCIAL HISTORY:  He is a former smoker.  Denies any alcohol or illicit drug use.  He is currently working.  ALLERGIES:  He has no known drug allergies.  HOME MEDICATIONS:  He is on: 1. Aspirin 81 mg p.o. daily. 2. Iron sulfate 325 mg p.o. daily. 3. Lipitor 80 mg p.o. daily. 4. Lisinopril 10 mg p.o. daily. 5. Metformin 500 mg p.o. b.i.d.. 6. Toprol-XL 25 mg p.o. daily.  PHYSICAL EXAMINATION:  VITAL SIGNS:  He is afebrile.  Temperature 97.8, pulse 65 and regular, respiratory rate of 20, blood pressure 94/50, O2 sats 100% on room air. GENERAL:  He is a well-developed white male, in no apparent distress. HEENT:  Eyes, he has anicteric sclerae.  Mucous membranes are moist. NECK:  He has normal jugular venous pressure.  No carotid bruits. LUNGS:  Clear to auscultation bilaterally. CHEST:  He has a well-healed midline sternotomy scar.  No tenderness to palpation. HEART:  Regular rate and rhythm.  He has a 3/6 systolic murmur, heard best at the right upper sternal border.  No rubs or gallops. ABDOMEN:  Soft, nontender, and nondistended.  No masses. EXTREMITIES:  Warm with no edema.  He has symmetrical pulses throughout. NEURO:  Grossly afocal.  He is awake, oriented. SKIN:  No rashes or ulcers.  LABORATORY DATA:  Sodium of 139, potassium 3.9, chloride of 105, BUN of 17, creatinine is 0.9, glucose 142, calcium of 1.1.  Troponin was 0.02. White blood cell count was 7.3 with a normal differential, hematocrit was 34.3, and platelet count  was 169.  Chest x-ray showed no acute pulmonary disease.  There is a residual either left-sided effusion or pleural thickening.  EKG shows sinus mechanism with a rate of 75 beats per minute.  No acute changes from his old EKG.  IMPRESSION AND PLAN:  This is a 78 year old male with known coronary artery disease, status post bypass grafting last year as well as aortic valve replacement with pericardial tissue valve, who presents with chest discomfort concerning for angina.  This is the first episode post surgery.  It occurred during exertion.  It was relieved with nitroglycerin in the ED.  He has a normal EKG and initially a normal troponin.  We will admit to monitor on telemetry with serial cardiac enzymes.  Otherwise, we will continue his medication regimen as is and continue his aspirin, his Toprol-XL, and his Lipitor.  He is currently pain-free.  For his diabetes, we will place him on his metformin and sliding scale insulin as needed.  We will admit him for observation.          ______________________________ Natasha Bence, MD     MH/MEDQ  D:  07/24/2011  T:  07/25/2011  Job:  [id number]

## 2011-07-25 NOTE — Progress Notes (Signed)
GXT MV done. 07/25/2011 10:45 AM

## 2011-08-22 ENCOUNTER — Ambulatory Visit: Payer: Medicare Other | Admitting: Cardiology

## 2011-09-18 ENCOUNTER — Encounter: Payer: Self-pay | Admitting: Cardiology

## 2011-09-18 ENCOUNTER — Ambulatory Visit (INDEPENDENT_AMBULATORY_CARE_PROVIDER_SITE_OTHER): Payer: Medicare Other | Admitting: Cardiology

## 2011-09-18 VITALS — BP 115/60 | HR 80 | Ht 71.0 in | Wt 174.0 lb

## 2011-09-18 DIAGNOSIS — I2510 Atherosclerotic heart disease of native coronary artery without angina pectoris: Secondary | ICD-10-CM

## 2011-09-18 DIAGNOSIS — I359 Nonrheumatic aortic valve disorder, unspecified: Secondary | ICD-10-CM

## 2011-09-18 DIAGNOSIS — R072 Precordial pain: Secondary | ICD-10-CM | POA: Insufficient documentation

## 2011-09-18 NOTE — Assessment & Plan Note (Signed)
He's having no further chest pain. He had a negative stress perfusion study. He will continue with risk reduction.

## 2011-09-18 NOTE — Patient Instructions (Addendum)
The current medical regimen is effective;  continue present plan and medications.  Follow up in 1 year with Dr Hochrein.  You will receive a letter in the mail 2 months before you are due.  Please call us when you receive this letter to schedule your follow up appointment.  

## 2011-09-18 NOTE — Assessment & Plan Note (Signed)
The patient has had no change in his physical symptoms. He stable post valve replacement. No further imaging is indicated.

## 2011-09-18 NOTE — Progress Notes (Signed)
   HPI The patient presents for followup of his aortic stenosis s/p aortic valve replacement.  He was hospitalized briefly in May with chest discomfort. He ruled out had a stress perfusion study which demonstrated an EF of 72% with no ischemia or infarct. Since then he's had no further symptoms. He denies any chest pressure, neck or arm discomfort. He's not having any palpitations, presyncope or syncope. He has no shortness of breath. He is pushing a mower and walking daily without symptoms   No Known Allergies  Current Outpatient Prescriptions  Medication Sig Dispense Refill  . aspirin 81 MG tablet Take 81 mg by mouth daily.        Marland Kitchen atorvastatin (LIPITOR) 80 MG tablet Take 80 mg by mouth daily.      . ferrous sulfate 325 (65 FE) MG tablet Take 325 mg by mouth daily with breakfast.        . lisinopril (PRINIVIL,ZESTRIL) 10 MG tablet Take 10 mg by mouth daily.      . metFORMIN (GLUCOPHAGE) 500 MG tablet 1 tab twice a day      . metoprolol succinate (TOPROL-XL) 25 MG 24 hr tablet Take 25 mg by mouth daily.        . nitroGLYCERIN (NITROSTAT) 0.4 MG SL tablet Place 1 tablet (0.4 mg total) under the tongue every 5 (five) minutes x 3 doses as needed for chest pain.  25 tablet  4    Past Medical History  Diagnosis Date  . Diabetes mellitus   . Aortic stenosis     s/p pericardial AVR  . Hyperlipidemia   . Benign prostatic hypertrophy   . Coronary artery disease     s/p CABG 08/2010    . Arthritis     Past Surgical History  Procedure Date  . Knee replacements     x 2  . Hernia repair   . Aortic valve replacement 08/24/10    23mm pericardial tissue valve  . Coronary artery bypass graft 08/24/10    LIMA to LAD, SVG to ramus intermediate, SVG to diagonal    ROS:  As stated in the HPI and negative for all other systems.  PHYSICAL EXAM BP 115/60  Pulse 80  Ht 5\' 11"  (1.803 m)  Wt 174 lb (78.926 kg)  BMI 24.27 kg/m2 GENERAL:  Well appearing HEENT:  Pupils equal round and reactive,  fundi not visualized, oral mucosa unremarkable NECK:  No jugular venous distention, waveform within normal limits, carotid upstroke brisk and symmetric, no bruits, no thyromegaly LYMPHATICS:  No cervical, inguinal adenopathy LUNGS:  Clear to auscultation bilaterally BACK:  No CVA tenderness CHEST:  Well healed sternotomy scar, prominent sternal wires HEART:  PMI not displaced or sustained,S1 and S2 within normal limits, no S3, no S4, no clicks, no rubs, apical systolic murmur radiating to the axilla and slightly out the aortic outflow tract ABD:  Flat, positive bowel sounds normal in frequency in pitch, no bruits, no rebound, no guarding, no midline pulsatile mass, no hepatomegaly, no splenomegaly  ASSESSMENT AND PLAN

## 2011-10-08 ENCOUNTER — Encounter: Payer: Self-pay | Admitting: Cardiology

## 2012-01-16 ENCOUNTER — Encounter: Payer: Self-pay | Admitting: Cardiology

## 2012-06-22 ENCOUNTER — Encounter: Payer: Self-pay | Admitting: *Deleted

## 2012-06-22 ENCOUNTER — Ambulatory Visit (INDEPENDENT_AMBULATORY_CARE_PROVIDER_SITE_OTHER): Payer: Medicare Other | Admitting: Family Medicine

## 2012-06-22 ENCOUNTER — Telehealth: Payer: Self-pay | Admitting: *Deleted

## 2012-06-22 VITALS — BP 110/60 | HR 70 | Temp 96.6°F | Ht 71.0 in | Wt 178.0 lb

## 2012-06-22 DIAGNOSIS — R42 Dizziness and giddiness: Secondary | ICD-10-CM | POA: Insufficient documentation

## 2012-06-22 DIAGNOSIS — I951 Orthostatic hypotension: Secondary | ICD-10-CM

## 2012-06-22 DIAGNOSIS — I2510 Atherosclerotic heart disease of native coronary artery without angina pectoris: Secondary | ICD-10-CM

## 2012-06-22 HISTORY — DX: Orthostatic hypotension: I95.1

## 2012-06-22 LAB — COMPLETE METABOLIC PANEL WITH GFR
ALT: 18 U/L (ref 0–53)
AST: 24 U/L (ref 0–37)
Albumin: 3.6 g/dL (ref 3.5–5.2)
Alkaline Phosphatase: 107 U/L (ref 39–117)
BUN: 13 mg/dL (ref 6–23)
CO2: 25 mEq/L (ref 19–32)
Calcium: 9.3 mg/dL (ref 8.4–10.5)
Chloride: 104 mEq/L (ref 96–112)
Creat: 0.87 mg/dL (ref 0.50–1.35)
GFR, Est African American: >89 mL/min
GFR, Est Non African American: 83 mL/min
Glucose, Bld: 94 mg/dL (ref 70–99)
Potassium: 4.6 mEq/L (ref 3.5–5.3)
Sodium: 139 mEq/L (ref 135–145)
Total Bilirubin: 0.6 mg/dL (ref 0.3–1.2)
Total Protein: 6.7 g/dL (ref 6.0–8.3)

## 2012-06-22 LAB — POCT CBC
Granulocyte percent: 70 %G (ref 37–80)
HCT, POC: 38.8 % — AB (ref 43.5–53.7)
Hemoglobin: 13 g/dL — AB (ref 14.1–18.1)
Lymph, poc: 1.9 (ref 0.6–3.4)
MCH, POC: 29.6 pg (ref 27–31.2)
MCHC: 33.5 g/dL (ref 31.8–35.4)
MCV: 88.4 fL (ref 80–97)
MPV: 8.1 fL (ref 0–99.8)
POC Granulocyte: 6.1 (ref 2–6.9)
POC LYMPH PERCENT: 22 %L (ref 10–50)
Platelet Count, POC: 215 10*3/uL (ref 142–424)
RBC: 4.4 M/uL — AB (ref 4.69–6.13)
RDW, POC: 13.4 %
WBC: 8.7 10*3/uL (ref 4.6–10.2)

## 2012-06-22 NOTE — Progress Notes (Deleted)
  Subjective:    Patient ID: [name], male    DOB: Apr 30, 1932, 79 y.o.   MRN: [id number]  HPI    Review of Systems     Objective:   Physical Exam        Assessment & Plan:

## 2012-06-22 NOTE — Progress Notes (Signed)
Patient ID: [name], male   DOB: Nov 15, 1932, 79 y.o.   MRN: [id number] SUBJECTIVE:   HPI: Patient was worked in today because of episodes of dizziness to the point he almost passed out. One week ago it occurred he changed position bent over and stood up and he got lightheaded and felt like he was obtained. No chest pain no palpitations at that time he is a cardiac patient was seen by Dr. Troy [name]. Occurred twice today when he would bend down to pull a latch on different gates. When he stood up he got lightheaded and dizzy and he felt like his visual field moving a little bit but is feeling fine now without any problems. Past Medical History  Diagnosis Date  . Diabetes mellitus   . Aortic stenosis     s/p pericardial AVR  . Hyperlipidemia   . Benign prostatic hypertrophy   . Coronary artery disease     s/p CABG 08/2010    . Arthritis    Past Surgical History  Procedure Laterality Date  . Knee replacements      x 2  . Hernia repair    . Aortic valve replacement  08/24/10    23mm pericardial tissue valve  . Coronary artery bypass graft  08/24/10    LIMA to LAD, SVG to ramus intermediate, SVG to diagonal   Current Outpatient Prescriptions on File Prior to Visit  Medication Sig Dispense Refill  . aspirin 81 MG tablet Take 81 mg by mouth daily.        Marland Kitchen atorvastatin (LIPITOR) 80 MG tablet Take 80 mg by mouth daily.      . ferrous sulfate 325 (65 FE) MG tablet Take 325 mg by mouth daily with breakfast.        . lisinopril (PRINIVIL,ZESTRIL) 10 MG tablet Take 10 mg by mouth daily.      . metFORMIN (GLUCOPHAGE) 500 MG tablet 1 tab twice a day      . metoprolol succinate (TOPROL-XL) 25 MG 24 hr tablet Take 25 mg by mouth daily.        . nitroGLYCERIN (NITROSTAT) 0.4 MG SL tablet Place 1 tablet (0.4 mg total) under the tongue every 5 (five) minutes x 3 doses as needed for chest pain.  25 tablet  4   No current facility-administered medications on file prior to visit.   No Known  Allergies  There is no immunization history on file for this patient. History   Social History  . Marital Status: Married    Spouse Name: N/A    Number of Children: N/A  . Years of Education: N/A   Occupational History  . Not on file.   Social History Main Topics  . Smoking status: Former Smoker    Types: Cigars    Quit date: 09/17/1993  . Smokeless tobacco: Never Used  . Alcohol Use: Yes     Comment: occassional  . Drug Use: No  . Sexually Active: Yes   Other Topics Concern  . Not on file   Social History Narrative  . No narrative on file    Review of systems: All other systems was negative no weakness no numbness in the arms or legs no slurred speech.  OBJECTIVE:    Patient in no acute distress. VS:BP 110/60  Pulse 70  Temp(Src) 96.6 F (35.9 C) (Oral)  Ht 5\' 11"  (1.803 m)  Wt 178 lb (80.74 kg)  BMI 24.84 kg/m2  SKIN: warm and  Dry without overt  rashes. HEAD and Neck: without JVD, Normal LUNGS: Clear CVS: Regular rhythm, normal sounds, and absence of murmurs, rubs or gallops. ABDOMEN: Benign,, no organomegaly, no masses, no Abdominal Aortic enlargement. EXTREMETIES: nonedematous. NEUROLOGIC: oriented to time,place and person; nonfocal He does have orthostatic hypotension. His systolic blood pressure dropped 20 points when he is still not and he had symptoms reproducible.   ASSESSMENT:   Dizzy - Plan: EKG 12-Lead, POCT CBC, COMPLETE METABOLIC PANEL WITH GFR  Orthostatic hypotension  CAD (coronary artery disease)   PLAN:  Orders Placed This Encounter  Procedures  . COMPLETE METABOLIC PANEL WITH GFR  . POCT CBC  . EKG 12-Lead   [facility] reading (PRIMARY) by  Dr. Leodis [name]: EKG , no acute changes.  Discussed with patient and and his wife.  Patient needs to follow up with Dr [name] ASAP. He will go next door to the specialty clinic to make that appointment.  Await labs..  Caution with arising from the supine p[osition. Fall risk discussed  with patient.  [name] P. Modesto Charon, M.D.

## 2012-06-22 NOTE — Telephone Encounter (Signed)
Patient called complaining of dizziness.  Had one episode last week as well.  He mainly noticed it when he bent over.  Blood sugar was 114 this morning but had no way to check his blood pressure.  He does take a blood pressure medication.  Dizziness isn't worse when lying down and doesn't describe it as spinning.  Denies headache or chest pain.    No available appointments in the clinic today.  He said he was willing to go to ED.  Suggested patient go to [facility] for evaluation and have someone drive him.  He is to follow-up as needed.  Patient stated understanding and agreed to plan.

## 2012-06-22 NOTE — Assessment & Plan Note (Addendum)
Labs: Cbc, CMP, Patient to be kept careful getting up from a lying position. Will need to see Dr. Richmond Dale Lions ASAP. EKG : no new findings.

## 2012-06-22 NOTE — Progress Notes (Signed)
This encounter was created in error - please disregard.

## 2012-06-23 NOTE — Progress Notes (Signed)
Quick Note:  Call patient.His anemia is better from last year. Rest of Labs normal. No change in plan. He needs to see DR Hochrein as recommended. ______

## 2012-06-24 ENCOUNTER — Encounter: Payer: Self-pay | Admitting: Cardiology

## 2012-06-24 ENCOUNTER — Ambulatory Visit (INDEPENDENT_AMBULATORY_CARE_PROVIDER_SITE_OTHER): Payer: Medicare Other | Admitting: Cardiology

## 2012-06-24 VITALS — BP 118/60 | HR 87 | Ht 71.0 in | Wt 177.0 lb

## 2012-06-24 DIAGNOSIS — I2510 Atherosclerotic heart disease of native coronary artery without angina pectoris: Secondary | ICD-10-CM

## 2012-06-24 DIAGNOSIS — I359 Nonrheumatic aortic valve disorder, unspecified: Secondary | ICD-10-CM

## 2012-06-24 NOTE — Progress Notes (Signed)
HPI The patient presents for followup of his aortic stenosis s/p aortic valve replacement.  He was told to call and follow with me because he had a couple of episodes of dizziness recently.   He was apparently orthostatic when he saw Dr. Modesto Charon recently areas he does not have orthostatic blood pressures today his blood pressure was running low. He said both episodes happened when he was trying to unlock a fence. He did not have actual loss of consciousness. He didn't feel his heart racing or skipping. He's not had any chest pressure, neck or arm discomfort. He's not had any new shortness of breath, PND or orthopnea. He did have one other episode of turning his head and getting a little dizzy. However, he's not had any other symptoms of vertigo. He's not describing any intercurrent orthostatic symptoms    No Known Allergies  Current Outpatient Prescriptions  Medication Sig Dispense Refill  . aspirin 81 MG tablet Take 81 mg by mouth daily.        Marland Kitchen atorvastatin (LIPITOR) 80 MG tablet Take 80 mg by mouth daily.      . ferrous sulfate 325 (65 FE) MG tablet Take 325 mg by mouth daily with breakfast.        . lisinopril (PRINIVIL,ZESTRIL) 10 MG tablet Take 10 mg by mouth daily.      . metFORMIN (GLUCOPHAGE) 500 MG tablet 1 tab twice a day      . metoprolol succinate (TOPROL-XL) 25 MG 24 hr tablet Take 25 mg by mouth daily.        . nitroGLYCERIN (NITROSTAT) 0.4 MG SL tablet Place 1 tablet (0.4 mg total) under the tongue every 5 (five) minutes x 3 doses as needed for chest pain.  25 tablet  4   No current facility-administered medications for this visit.    Past Medical History  Diagnosis Date  . Diabetes mellitus   . Aortic stenosis     s/p pericardial AVR  . Hyperlipidemia   . Benign prostatic hypertrophy   . Coronary artery disease     s/p CABG 08/2010    . Arthritis     Past Surgical History  Procedure Laterality Date  . Knee replacements      x 2  . Hernia repair    . Aortic valve  replacement  08/24/10    23mm pericardial tissue valve  . Coronary artery bypass graft  08/24/10    LIMA to LAD, SVG to ramus intermediate, SVG to diagonal    ROS:  As stated in the HPI and negative for all other systems.  PHYSICAL EXAM BP 129/65  Pulse 84  Ht 5\' 11"  (1.803 m)  Wt 177 lb (80.287 kg)  BMI 24.7 kg/m2 GENERAL:  Well appearing HEENT:  Pupils equal round and reactive, fundi not visualized, oral mucosa unremarkable NECK:  No jugular venous distention, waveform within normal limits, carotid upstroke brisk and symmetric, no bruits, no thyromegaly LYMPHATICS:  No cervical, inguinal adenopathy LUNGS:  Clear to auscultation bilaterally BACK:  No CVA tenderness CHEST:  Well healed sternotomy scar, prominent sternal wires HEART:  PMI not displaced or sustained,S1 and S2 within normal limits, no S3, no S4, no clicks, no rubs, apical systolic murmur radiating to the axilla and slightly out the aortic outflow tract ABD:  Flat, positive bowel sounds normal in frequency in pitch, no bruits, no rebound, no guarding, no midline pulsatile mass, no hepatomegaly, no splenomegaly  ASSESSMENT AND PLAN  DIZZINESS:  The etiology  of this is not clear.  I will check an echo since I have not done one since the valve replacement.  However, I do not have a strong suspicion that these episodes were related to this.   I will have him hold his lisinopril as his blood pressure is running low. Of note he has not taken this for the last 2 days.  CAD:  He has had no new symptoms since his last stress test.   AS s/p AVR:   This will be evaluated as above.  HTN:  This will be managed as above.

## 2012-06-24 NOTE — Patient Instructions (Addendum)
Please hold your Lisinopril for now due to your lightheadedness. Continue all other medications as listed.  Your physician has requested that you have an echocardiogram. Echocardiography is a painless test that uses sound waves to create images of your heart. It provides your doctor with information about the size and shape of your heart and how well your heart's chambers and valves are working. This procedure takes approximately one hour. There are no restrictions for this procedure.  Check your follow up appointment as scheduled with Dr Antoine Poche 6/25 in [place].

## 2012-06-25 ENCOUNTER — Encounter: Payer: Self-pay | Admitting: *Deleted

## 2012-06-26 ENCOUNTER — Other Ambulatory Visit: Payer: Self-pay | Admitting: *Deleted

## 2012-06-26 MED ORDER — GLUCOSE BLOOD VI STRP: ORAL_STRIP | Status: DC

## 2012-06-29 ENCOUNTER — Telehealth: Payer: Self-pay | Admitting: Cardiology

## 2012-06-30 ENCOUNTER — Ambulatory Visit: Payer: Self-pay | Admitting: Family Medicine

## 2012-07-01 ENCOUNTER — Ambulatory Visit (HOSPITAL_COMMUNITY): Payer: Medicare Other | Attending: Cardiovascular Disease

## 2012-07-01 DIAGNOSIS — I359 Nonrheumatic aortic valve disorder, unspecified: Secondary | ICD-10-CM | POA: Insufficient documentation

## 2012-07-01 DIAGNOSIS — R42 Dizziness and giddiness: Secondary | ICD-10-CM | POA: Insufficient documentation

## 2012-07-01 DIAGNOSIS — I2510 Atherosclerotic heart disease of native coronary artery without angina pectoris: Secondary | ICD-10-CM | POA: Insufficient documentation

## 2012-07-01 NOTE — Progress Notes (Signed)
Echocardiogram performed.  

## 2012-07-16 ENCOUNTER — Other Ambulatory Visit: Payer: Self-pay | Admitting: Family Medicine

## 2012-07-17 NOTE — Telephone Encounter (Signed)
Last seen 01/13/12, had appt. 07/05/12, but no showed.

## 2012-08-05 ENCOUNTER — Other Ambulatory Visit: Payer: Self-pay | Admitting: Family Medicine

## 2012-09-05 ENCOUNTER — Other Ambulatory Visit: Payer: Self-pay | Admitting: Family Medicine

## 2012-09-13 ENCOUNTER — Other Ambulatory Visit: Payer: Self-pay | Admitting: Family Medicine

## 2012-09-16 ENCOUNTER — Ambulatory Visit: Payer: Medicare Other | Admitting: Cardiology

## 2012-10-09 ENCOUNTER — Other Ambulatory Visit: Payer: Self-pay | Admitting: Family Medicine

## 2012-10-12 ENCOUNTER — Other Ambulatory Visit: Payer: Self-pay | Admitting: Family Medicine

## 2012-10-12 NOTE — Telephone Encounter (Signed)
The patient needs an appointment to be seen and to get lab work. May refill the medication x1

## 2012-10-12 NOTE — Telephone Encounter (Signed)
LAST LIPID PANEL 10/13.

## 2012-10-12 NOTE — Telephone Encounter (Signed)
LAST LABS 10/13. AIC WAS 5.9

## 2012-10-12 NOTE — Telephone Encounter (Signed)
Patient needs an appointment to be seen Will refill the medication x1

## 2012-11-04 ENCOUNTER — Encounter: Payer: Self-pay | Admitting: Cardiology

## 2012-11-04 ENCOUNTER — Ambulatory Visit (INDEPENDENT_AMBULATORY_CARE_PROVIDER_SITE_OTHER): Payer: Medicare Other | Admitting: Cardiology

## 2012-11-04 VITALS — BP 117/59 | HR 92 | Ht 71.0 in | Wt 180.0 lb

## 2012-11-04 DIAGNOSIS — I2510 Atherosclerotic heart disease of native coronary artery without angina pectoris: Secondary | ICD-10-CM

## 2012-11-04 DIAGNOSIS — I359 Nonrheumatic aortic valve disorder, unspecified: Secondary | ICD-10-CM

## 2012-11-04 NOTE — Progress Notes (Signed)
HPI The patient presents for followup of his aortic stenosis s/p aortic valve replacement.  Previously he had been complaining of dizziness. He has had none of that since I last saw him. He had an echocardiogram this spring demonstrating a stable valve replacement and normal ejection fraction. He walks every day. The patient denies any new symptoms such as chest discomfort, neck or arm discomfort. There has been no new shortness of breath, PND or orthopnea. There have been no reported palpitations, presyncope or syncope.    No Known Allergies  Current Outpatient Prescriptions  Medication Sig Dispense Refill  . aspirin 81 MG tablet Take 81 mg by mouth daily.        Marland Kitchen atorvastatin (LIPITOR) 80 MG tablet TAKE ONE TABLET BY MOUTH ONE TIME DAILY  30 tablet  0  . FeFum-FePoly-FA-B Cmp-C-Biot (FOLIVANE-PLUS) CAPS TAKE ONE CAPSULE BY MOUTH ONE TIME DAILY  30 capsule  2  . ferrous sulfate 325 (65 FE) MG tablet Take 325 mg by mouth daily with breakfast.        . glucose blood (FREESTYLE LITE) test strip Test once daily. Dx 250.0  100 each  2  . lisinopril (PRINIVIL,ZESTRIL) 10 MG tablet Take 10 mg by mouth daily.      . metFORMIN (GLUCOPHAGE) 500 MG tablet TAKE ONE TABLET BY MOUTH TWICE DAILY  60 tablet  0  . metoprolol succinate (TOPROL-XL) 25 MG 24 hr tablet Take 25 mg by mouth daily.        . nitroGLYCERIN (NITROSTAT) 0.4 MG SL tablet Place 1 tablet (0.4 mg total) under the tongue every 5 (five) minutes x 3 doses as needed for chest pain.  25 tablet  4   No current facility-administered medications for this visit.    Past Medical History  Diagnosis Date  . Diabetes mellitus   . Aortic stenosis     s/p pericardial AVR  . Hyperlipidemia   . Benign prostatic hypertrophy   . Coronary artery disease     s/p CABG 08/2010    . Arthritis     Past Surgical History  Procedure Laterality Date  . Knee replacements      x 2  . Hernia repair    . Aortic valve replacement  08/24/10    23mm  pericardial tissue valve  . Coronary artery bypass graft  08/24/10    LIMA to LAD, SVG to ramus intermediate, SVG to diagonal    ROS:  As stated in the HPI and negative for all other systems.  PHYSICAL EXAM BP 117/59  Pulse 92  Ht 5\' 11"  (1.803 m)  Wt 180 lb (81.647 kg)  BMI 25.12 kg/m2 GENERAL:  Well appearing HEENT:  Pupils equal round and reactive, fundi not visualized, oral mucosa unremarkable NECK:  No jugular venous distention, waveform within normal limits, carotid upstroke brisk and symmetric, no bruits, no thyromegaly LYMPHATICS:  No cervical, inguinal adenopathy LUNGS:  Clear to auscultation bilaterally BACK:  No CVA tenderness CHEST:  Well healed sternotomy scar, prominent sternal wires HEART:  PMI not displaced or sustained,S1 and S2 within normal limits, no S3, no S4, no clicks, no rubs, apical systolic murmur radiating to the axilla and slightly out the aortic outflow tract ABD:  Flat, positive bowel sounds normal in frequency in pitch, no bruits, no rebound, no guarding, no midline pulsatile mass, no hepatomegaly, no splenomegaly  EKG:  Sinus rhythm, rate 92, left axis deviation, no acute ST T wave changes 11/04/2012  ASSESSMENT AND PLAN  DIZZINESS:  This is no longer a problem. He did have some medications reduced. No change in therapy is indicated.  CAD:  He has had no new symptoms since his last stress test. He will continue with risk reduction.  AS s/p AVR:   This was stable on his recent echo. No change in therapy is indicated.  HTN:  No change in therapy is indicated.

## 2012-11-04 NOTE — Patient Instructions (Addendum)
Your physician wants you to follow-up in: 1 YEAR FOLLOW UP WITH DR. HOCHREIN. You will receive a reminder letter in the mail two months in advance. If you don't receive a letter, please call our office to schedule the follow-up appointment.   NO CHANGES WERE MADE TODAY

## 2012-11-09 ENCOUNTER — Other Ambulatory Visit: Payer: Self-pay | Admitting: Family Medicine

## 2012-11-10 NOTE — Telephone Encounter (Signed)
Last seen and last glucose 06/22/12  FP[facility]

## 2012-12-11 ENCOUNTER — Other Ambulatory Visit: Payer: Self-pay | Admitting: Family Medicine

## 2012-12-14 NOTE — Telephone Encounter (Signed)
Last seen 3/31  FP[facility]

## 2012-12-14 NOTE — Telephone Encounter (Signed)
Prescription renewed in EPIC. 

## 2012-12-19 ENCOUNTER — Other Ambulatory Visit: Payer: Self-pay | Admitting: Family Medicine

## 2013-01-12 ENCOUNTER — Other Ambulatory Visit: Payer: Self-pay | Admitting: Family Medicine

## 2013-01-13 ENCOUNTER — Other Ambulatory Visit: Payer: Self-pay | Admitting: Family Medicine

## 2013-01-13 ENCOUNTER — Ambulatory Visit (INDEPENDENT_AMBULATORY_CARE_PROVIDER_SITE_OTHER): Payer: Medicare Other

## 2013-01-13 DIAGNOSIS — Z23 Encounter for immunization: Secondary | ICD-10-CM

## 2013-01-26 ENCOUNTER — Ambulatory Visit (INDEPENDENT_AMBULATORY_CARE_PROVIDER_SITE_OTHER): Payer: Medicare Other | Admitting: Family Medicine

## 2013-01-26 ENCOUNTER — Encounter: Payer: Self-pay | Admitting: Family Medicine

## 2013-01-26 ENCOUNTER — Ambulatory Visit (INDEPENDENT_AMBULATORY_CARE_PROVIDER_SITE_OTHER): Payer: Medicare Other

## 2013-01-26 VITALS — BP 142/83 | HR 67 | Temp 97.6°F | Ht 71.0 in | Wt 178.0 lb

## 2013-01-26 DIAGNOSIS — Z954 Presence of other heart-valve replacement: Secondary | ICD-10-CM

## 2013-01-26 DIAGNOSIS — Z952 Presence of prosthetic heart valve: Secondary | ICD-10-CM

## 2013-01-26 DIAGNOSIS — Z87898 Personal history of other specified conditions: Secondary | ICD-10-CM

## 2013-01-26 DIAGNOSIS — I10 Essential (primary) hypertension: Secondary | ICD-10-CM

## 2013-01-26 DIAGNOSIS — E785 Hyperlipidemia, unspecified: Secondary | ICD-10-CM

## 2013-01-26 DIAGNOSIS — Z0000 Encounter for general adult medical examination without abnormal findings: Secondary | ICD-10-CM

## 2013-01-26 DIAGNOSIS — E559 Vitamin D deficiency, unspecified: Secondary | ICD-10-CM

## 2013-01-26 DIAGNOSIS — E119 Type 2 diabetes mellitus without complications: Secondary | ICD-10-CM

## 2013-01-26 DIAGNOSIS — I2510 Atherosclerotic heart disease of native coronary artery without angina pectoris: Secondary | ICD-10-CM

## 2013-01-26 LAB — POCT URINALYSIS DIPSTICK
Protein, UA: NEGATIVE
Spec Grav, UA: 1.01
Urobilinogen, UA: NEGATIVE

## 2013-01-26 LAB — POCT UA - MICROSCOPIC ONLY
Bacteria, U Microscopic: NEGATIVE
Casts, Ur, LPF, POC: NEGATIVE
Crystals, Ur, HPF, POC: NEGATIVE

## 2013-01-26 LAB — POCT CBC
Lymph, poc: 1.7 (ref 0.6–3.4)
MCHC: 33.7 g/dL (ref 31.8–35.4)
MPV: 8.5 fL (ref 0–99.8)
POC Granulocyte: 4.7 (ref 2–6.9)
POC LYMPH PERCENT: 24.5 %L (ref 10–50)
RDW, POC: 12.4 %
WBC: 6.8 10*3/uL (ref 4.6–10.2)

## 2013-01-26 LAB — POCT GLYCOSYLATED HEMOGLOBIN (HGB A1C): Hemoglobin A1C: 5.6

## 2013-01-26 NOTE — Patient Instructions (Addendum)
Continue current medications. Continue good therapeutic lifestyle changes.  Fall precautions discussed with patient. Follow up as planned and earlier as needed.  Call back for prevnar vaccine- it is currently on backorder- you will need this before winter. Monitor blood sugars regularly Monitor blood pressures with an Omron blood pressure cuff Debrox over-the-counter for ear wax softening Drink plenty of fluids Keep eye exam appointment and make sure that our office gets copy of that visit

## 2013-01-26 NOTE — Addendum Note (Signed)
Addended by: Prescott Gum on: 01/26/2013 09:28 AM   Modules accepted: Orders

## 2013-01-26 NOTE — Addendum Note (Signed)
Addended by: Magdalene River on: 01/26/2013 09:04 AM   Modules accepted: Orders

## 2013-01-26 NOTE — Progress Notes (Addendum)
Subjective:    Patient ID: [name], male    DOB: 11/15/1932, 79 y.o.   MRN: [id number]  HPI Pt here for follow up and management of chronic medical problems. He is also here to get an annual physical exam. He comes to the visit today with his wife. He indicates that he is doing well with no particular problems. He monitors his blood sugars at home but does not monitor his blood pressures.       Patient Active Problem List   Diagnosis Date Noted  . Orthostatic hypotension 06/22/2012  . Dizzy 06/22/2012  . Precordial pain 09/18/2011  . Unstable angina 07/24/2011  . CAD (coronary artery disease) 01/30/2011  . COLONIC POLYPS 06/23/2008  . DM 06/23/2008  . HYPERLIPIDEMIA-MIXED 06/23/2008  . AORTIC STENOSIS 06/23/2008  . BENIGN PROSTATIC HYPERTROPHY, HX OF 06/23/2008   Outpatient Encounter Prescriptions as of 01/26/2013  Medication Sig  . aspirin 81 MG tablet Take 81 mg by mouth daily.    Marland Kitchen atorvastatin (LIPITOR) 80 MG tablet TAKE ONE TABLET BY MOUTH ONE TIME DAILY  . ferrous sulfate 325 (65 FE) MG tablet Take 325 mg by mouth daily with breakfast.  . glucose blood (FREESTYLE LITE) test strip Test once daily. Dx 250.0  . lisinopril (PRINIVIL,ZESTRIL) 10 MG tablet TAKE ONE TABLET BY MOUTH ONE TIME DAILY  . metFORMIN (GLUCOPHAGE) 500 MG tablet TAKE ONE TABLET BY MOUTH TWICE DAILY  . metoprolol succinate (TOPROL-XL) 25 MG 24 hr tablet Take 25 mg by mouth daily.    . nitroGLYCERIN (NITROSTAT) 0.4 MG SL tablet Place 1 tablet (0.4 mg total) under the tongue every 5 (five) minutes x 3 doses as needed for chest pain.  . [DISCONTINUED] FeFum-FePoly-FA-B Cmp-C-Biot (FOLIVANE-PLUS) CAPS TAKE ONE CAPSULE BY MOUTH ONE TIME DAILY  . [DISCONTINUED] ferrous sulfate 325 (65 FE) MG tablet Take 325 mg by mouth daily with breakfast.      Review of Systems  Constitutional: Negative.   HENT: Negative.   Eyes: Negative.   Respiratory: Negative.   Cardiovascular: Negative.   Gastrointestinal:  Negative.   Endocrine: Negative.   Genitourinary: Negative.   Musculoskeletal: Negative.   Skin: Negative.   Allergic/Immunologic: Negative.   Neurological: Negative.   Hematological: Negative.   Psychiatric/Behavioral: Negative.        Objective:   Physical Exam  Nursing note and vitals reviewed. Constitutional: He is oriented to person, place, and time. He appears well-developed and well-nourished. No distress.  HENT:  Head: Normocephalic and atraumatic.  Right Ear: External ear normal.  Left Ear: External ear normal.  Nose: Nose normal.  Mouth/Throat: Oropharynx is clear and moist. No oropharyngeal exudate.  Bilateral ear wax Nasal congestion  Eyes: Conjunctivae and EOM are normal. Pupils are equal, round, and reactive to light. Right eye exhibits no discharge. Left eye exhibits no discharge. No scleral icterus.  Neck: Normal range of motion. Neck supple. No tracheal deviation present. No thyromegaly present.  No bruits in the neck  Cardiovascular: Normal rate, regular rhythm and intact distal pulses.  Exam reveals no gallop and no friction rub.   Murmur (grade 1/6 systolic ejection murmur) heard. At 72 per minute  Pulmonary/Chest: Effort normal and breath sounds normal. No respiratory distress. He has no wheezes. He has no rales. He exhibits no tenderness.  Midline chest wall incision for aortic valve replacement  Abdominal: Soft. Bowel sounds are normal. He exhibits no mass. There is no tenderness. There is no rebound and no guarding.  Genitourinary: Rectum normal  and penis normal.  Prostate slightly enlarged without lumps or masses. No rectal masses. No hernia  Musculoskeletal: Normal range of motion. He exhibits no edema and no tenderness.  Lymphadenopathy:    He has no cervical adenopathy.  Neurological: He is alert and oriented to person, place, and time. He has normal reflexes. No cranial nerve deficit.  Skin: Skin is warm and dry. No rash noted. No erythema. No  pallor.  Psychiatric: He has a normal mood and affect. His behavior is normal. Judgment and thought content normal.   BP 142/83  Pulse 67  Temp(Src) 97.6 F (36.4 C) (Oral)  Ht 5\' 11"  (1.803 m)  Wt 178 lb (80.74 kg)  BMI 24.84 kg/m2  [facility] reading (PRIMARY) by  Dr. Christell Constant; chest x-ray --previous cardiovascular surgery                                     Assessment & Plan:   1. Encounter for Medicare annual wellness exam   2. CAD (coronary artery disease)   3. BENIGN PROSTATIC HYPERTROPHY, HX OF   4. DM   5. HYPERLIPIDEMIA-MIXED   6. Vitamin D deficiency   7. H/O aortic valve replacement   8. Hypertension    Orders Placed This Encounter  Procedures  . Hepatic function panel  . BMP8+EGFR  . NMR, lipoprofile  . PSA, total and free  . Vit D  25 hydroxy (rtn osteoporosis monitoring)  . POCT CBC  . POCT glycosylated hemoglobin (Hb A1C)  . POCT UA - Microalbumin  . POCT UA - Microscopic Only  . POCT urinalysis dipstick   Meds ordered this encounter  Medications  . ferrous sulfate 325 (65 FE) MG tablet    Sig: Take 325 mg by mouth daily with breakfast.   Patient Instructions  Continue current medications. Continue good therapeutic lifestyle changes.  Fall precautions discussed with patient. Follow up as planned and earlier as needed.  Call back for prevnar vaccine- it is currently on backorder- you will need this before winter. Monitor blood sugars regularly Monitor blood pressures with an Omron blood pressure cuff Debrox over-the-counter for ear wax softening Drink plenty of fluids Keep eye exam appointment and make sure that our office gets copy of that visit   Nyra Capes MD

## 2013-01-27 ENCOUNTER — Other Ambulatory Visit (INDEPENDENT_AMBULATORY_CARE_PROVIDER_SITE_OTHER): Payer: Medicare Other

## 2013-01-27 DIAGNOSIS — Z1212 Encounter for screening for malignant neoplasm of rectum: Secondary | ICD-10-CM

## 2013-01-27 NOTE — Progress Notes (Unsigned)
Pt came in for labs only FOBT

## 2013-01-28 LAB — PSA, TOTAL AND FREE
PSA, Free Pct: 43.3 %
PSA, Free: 0.13 ng/mL
PSA: 0.3 ng/mL (ref 0.0–4.0)

## 2013-01-28 LAB — BMP8+EGFR
BUN: 13 mg/dL (ref 8–27)
CO2: 23 mmol/L (ref 18–29)
Calcium: 9.4 mg/dL (ref 8.6–10.2)
Chloride: 100 mmol/L (ref 97–108)
GFR calc Af Amer: 97 mL/min/{1.73_m2} (ref >59)
Glucose: 105 mg/dL — ABNORMAL HIGH (ref 65–99)

## 2013-01-28 LAB — NMR, LIPOPROFILE
Cholesterol: 132 mg/dL (ref <200)
LDL Particle Number: 1018 nmol/L — ABNORMAL HIGH (ref <1000)
LDLC SERPL CALC-MCNC: 66 mg/dL (ref <100)
LP-IR Score: 27 (ref <=45)

## 2013-01-28 LAB — HEPATIC FUNCTION PANEL
ALT: 20 IU/L (ref 0–44)
Alkaline Phosphatase: 139 IU/L — ABNORMAL HIGH (ref 39–117)
Total Protein: 6.8 g/dL (ref 6.0–8.5)

## 2013-01-28 LAB — VITAMIN D 25 HYDROXY (VIT D DEFICIENCY, FRACTURES): Vit D, 25-Hydroxy: 37.3 ng/mL (ref 30.0–100.0)

## 2013-02-02 ENCOUNTER — Encounter: Payer: Self-pay | Admitting: *Deleted

## 2013-02-02 NOTE — Progress Notes (Signed)
Quick Note:  Copy of labs sent to patient ______ 

## 2013-02-12 ENCOUNTER — Other Ambulatory Visit: Payer: Self-pay | Admitting: Family Medicine

## 2013-02-16 ENCOUNTER — Other Ambulatory Visit: Payer: Self-pay | Admitting: Family Medicine

## 2013-03-09 ENCOUNTER — Other Ambulatory Visit: Payer: Self-pay | Admitting: Family Medicine

## 2013-05-11 ENCOUNTER — Ambulatory Visit: Payer: Medicare Other | Admitting: Family Medicine

## 2013-05-14 ENCOUNTER — Other Ambulatory Visit: Payer: Self-pay | Admitting: Family Medicine

## 2013-05-25 ENCOUNTER — Other Ambulatory Visit: Payer: Commercial Managed Care - HMO

## 2013-05-26 ENCOUNTER — Other Ambulatory Visit: Payer: Self-pay

## 2013-05-26 ENCOUNTER — Telehealth: Payer: Self-pay | Admitting: Family Medicine

## 2013-05-26 MED ORDER — GLUCOSE BLOOD VI STRP: ORAL_STRIP | Status: DC

## 2013-05-28 ENCOUNTER — Other Ambulatory Visit: Payer: Self-pay | Admitting: *Deleted

## 2013-05-28 DIAGNOSIS — E119 Type 2 diabetes mellitus without complications: Secondary | ICD-10-CM

## 2013-05-28 MED ORDER — GLUCOSE BLOOD VI STRP: ORAL_STRIP | Status: DC

## 2013-06-03 ENCOUNTER — Ambulatory Visit (INDEPENDENT_AMBULATORY_CARE_PROVIDER_SITE_OTHER): Payer: Commercial Managed Care - HMO | Admitting: Family Medicine

## 2013-06-03 ENCOUNTER — Encounter: Payer: Self-pay | Admitting: Family Medicine

## 2013-06-03 VITALS — BP 149/74 | HR 93 | Temp 97.0°F | Ht 71.0 in | Wt 174.0 lb

## 2013-06-03 DIAGNOSIS — E559 Vitamin D deficiency, unspecified: Secondary | ICD-10-CM

## 2013-06-03 DIAGNOSIS — Z23 Encounter for immunization: Secondary | ICD-10-CM

## 2013-06-03 DIAGNOSIS — E119 Type 2 diabetes mellitus without complications: Secondary | ICD-10-CM

## 2013-06-03 DIAGNOSIS — E785 Hyperlipidemia, unspecified: Secondary | ICD-10-CM

## 2013-06-03 DIAGNOSIS — I2510 Atherosclerotic heart disease of native coronary artery without angina pectoris: Secondary | ICD-10-CM

## 2013-06-03 DIAGNOSIS — Z87898 Personal history of other specified conditions: Secondary | ICD-10-CM

## 2013-06-03 NOTE — Progress Notes (Signed)
Subjective:    Patient ID: [name], male    DOB: Dec 26, 1932, 80 y.o.   MRN: [id number]  HPI Pt here for follow up and management of chronic medical problems. The patient is doing well no particular complaints. He indicates he does better with his blood sugar when he walks more. He denies any chest pain or shortness of breath. His home blood sugars have been running in the low 100 range. He is due to get a Prevnar vaccine today. He will return to the clinic fasting to get his lab work.       Patient Active Problem List   Diagnosis Date Noted  . Orthostatic hypotension 06/22/2012  . Dizzy 06/22/2012  . Precordial pain 09/18/2011  . Unstable angina 07/24/2011  . CAD (coronary artery disease) 01/30/2011  . COLONIC POLYPS 06/23/2008  . DM 06/23/2008  . HYPERLIPIDEMIA-MIXED 06/23/2008  . AORTIC STENOSIS 06/23/2008  . BENIGN PROSTATIC HYPERTROPHY, HX OF 06/23/2008   Outpatient Encounter Prescriptions as of 06/03/2013  Medication Sig  . aspirin 81 MG tablet Take 81 mg by mouth daily.    Marland Kitchen atorvastatin (LIPITOR) 80 MG tablet TAKE ONE TABLET BY MOUTH ONE TIME DAILY  . FeFum-FePoly-FA-B Cmp-C-Biot (FOLIVANE-PLUS) CAPS TAKE ONE CAPSULE BY MOUTH ONE TIME DAILY  . ferrous sulfate 325 (65 FE) MG tablet Take 325 mg by mouth daily with breakfast.  . glucose blood (FREESTYLE LITE) test strip Test once daily. Dx 250.0  . lisinopril (PRINIVIL,ZESTRIL) 10 MG tablet TAKE ONE TABLET BY MOUTH ONE TIME DAILY  . metFORMIN (GLUCOPHAGE) 500 MG tablet TAKE ONE TABLET BY MOUTH TWICE DAILY  . metoprolol succinate (TOPROL-XL) 25 MG 24 hr tablet Take 25 mg by mouth daily.    . nitroGLYCERIN (NITROSTAT) 0.4 MG SL tablet Place 1 tablet (0.4 mg total) under the tongue every 5 (five) minutes x 3 doses as needed for chest pain.    Review of Systems  Constitutional: Negative.   HENT: Negative.   Eyes: Negative.   Respiratory: Negative.   Cardiovascular: Negative.   Gastrointestinal: Negative.     Endocrine: Negative.   Genitourinary: Negative.   Musculoskeletal: Negative.   Skin: Negative.   Allergic/Immunologic: Negative.   Neurological: Negative.   Hematological: Negative.   Psychiatric/Behavioral: Negative.        Objective:   Physical Exam  Nursing note and vitals reviewed. Constitutional: He is oriented to person, place, and time. He appears well-developed and well-nourished. No distress.  HENT:  Head: Normocephalic and atraumatic.  Right Ear: External ear normal.  Left Ear: External ear normal.  Nose: Nose normal.  Mouth/Throat: Oropharynx is clear and moist. No oropharyngeal exudate.  Eyes: Conjunctivae and EOM are normal. Pupils are equal, round, and reactive to light. Right eye exhibits no discharge. Left eye exhibits no discharge. No scleral icterus.  Neck: Normal range of motion. Neck supple. No tracheal deviation present. No thyromegaly present.  Patient has bilateral carotid bruits that are radiating from the heart.  Cardiovascular: Normal rate, regular rhythm and intact distal pulses.  Exam reveals no gallop and no friction rub.   Murmur (grade 3/6 systolic ejection murmur) heard. At 84 per minute  Pulmonary/Chest: Effort normal and breath sounds normal. No respiratory distress. He has no wheezes. He has no rales. He exhibits no tenderness.  No axillary nodes  Abdominal: Soft. Bowel sounds are normal. He exhibits no mass. There is no tenderness. There is no rebound and no guarding.  No inguinal nodes  Musculoskeletal: Normal range of  motion. He exhibits no edema and no tenderness.  The patient has a kyphotic spine  Lymphadenopathy:    He has no cervical adenopathy.  Neurological: He is alert and oriented to person, place, and time. He has normal reflexes. No cranial nerve deficit.  Skin: Skin is warm and dry. No rash noted. No erythema. No pallor.  Psychiatric: He has a normal mood and affect. His behavior is normal. Judgment and thought content normal.    BP 149/74  Pulse 93  Temp(Src) 97 F (36.1 C) (Oral)  Ht _0  (1.803 m)  Wt 174 lb (78.926 kg)  BMI 24.28 kg/m2        Assessment & Plan:   1. BENIGN PROSTATIC HYPERTROPHY, HX OF - POCT CBC; Future  2. CAD (coronary artery disease) - POCT CBC; Future - BMP8+EGFR; Future - Hepatic function panel; Future  3. DM - POCT CBC; Future - POCT glycosylated hemoglobin (Hb A1C); Future  4. HYPERLIPIDEMIA-MIXED - POCT CBC; Future - NMR, lipoprofile; Future  5. Vitamin D deficiency - Vit D  25 hydroxy (rtn osteoporosis monitoring) - POCT CBC; Future - Vit D  25 hydroxy (rtn osteoporosis monitoring); Future  Patient Instructions                       Medicare Annual Wellness Visit  [facility] and the medical providers at [facility] strive to bring you the best medical care.  In doing so we not only want to address your current medical conditions and concerns but also to detect new conditions early and prevent illness, disease and health-related problems.    Medicare offers a yearly Wellness Visit which allows our clinical staff to assess your need for preventative services including immunizations, lifestyle education, counseling to decrease risk of preventable diseases and screening for fall risk and other medical concerns.    This visit is provided free of charge (no copay) for all Medicare recipients. The clinical pharmacists at [facility] have begun to conduct these Wellness Visits which will also include a thorough review of all your medications.    As you primary medical provider recommend that you make an appointment for your Annual Wellness Visit if you have not done so already this year.  You may set up this appointment before you leave today or you may call back [phone]) and schedule an appointment.  Please make sure when you call that you mention that you are scheduling your Annual Wellness Visit with the clinical  pharmacist so that the appointment may be made for the proper length of time.     Continue current medications. Continue good therapeutic lifestyle changes which include good diet and exercise. Fall precautions discussed with patient. If an FOBT was given today- please return it to our front desk. If you are over 67 years old - you may need Prevnar 62 or the adult Pneumonia vaccine.  Return to clinic for fasting lab work The Prevnar vaccine that he received today may make your arm sore Continue to exercise regularly and check your blood sugars on a regular basis.   Arrie Senate MD

## 2013-06-03 NOTE — Addendum Note (Signed)
Addended by: Zannie Cove on: 06/03/2013 04:15 PM   Modules accepted: Orders

## 2013-06-03 NOTE — Patient Instructions (Addendum)
Medicare Annual Wellness Visit  [facility] and the medical providers at [facility] strive to bring you the best medical care.  In doing so we not only want to address your current medical conditions and concerns but also to detect new conditions early and prevent illness, disease and health-related problems.    Medicare offers a yearly Wellness Visit which allows our clinical staff to assess your need for preventative services including immunizations, lifestyle education, counseling to decrease risk of preventable diseases and screening for fall risk and other medical concerns.    This visit is provided free of charge (no copay) for all Medicare recipients. The clinical pharmacists at [name] have begun to conduct these Wellness Visits which will also include a thorough review of all your medications.    As you primary medical provider recommend that you make an appointment for your Annual Wellness Visit if you have not done so already this year.  You may set up this appointment before you leave today or you may call back [phone]) and schedule an appointment.  Please make sure when you call that you mention that you are scheduling your Annual Wellness Visit with the clinical pharmacist so that the appointment may be made for the proper length of time.     Continue current medications. Continue good therapeutic lifestyle changes which include good diet and exercise. Fall precautions discussed with patient. If an FOBT was given today- please return it to our front desk. If you are over 80 years old - you may need Prevnar 47 or the adult Pneumonia vaccine.  Return to clinic for fasting lab work The Prevnar vaccine that he received today may make your arm sore Continue to exercise regularly and check your blood sugars on a regular basis.

## 2013-06-04 ENCOUNTER — Other Ambulatory Visit (INDEPENDENT_AMBULATORY_CARE_PROVIDER_SITE_OTHER): Payer: Commercial Managed Care - HMO

## 2013-06-04 DIAGNOSIS — Z87898 Personal history of other specified conditions: Secondary | ICD-10-CM

## 2013-06-04 DIAGNOSIS — E785 Hyperlipidemia, unspecified: Secondary | ICD-10-CM

## 2013-06-04 DIAGNOSIS — E119 Type 2 diabetes mellitus without complications: Secondary | ICD-10-CM

## 2013-06-04 DIAGNOSIS — E559 Vitamin D deficiency, unspecified: Secondary | ICD-10-CM

## 2013-06-04 DIAGNOSIS — I2510 Atherosclerotic heart disease of native coronary artery without angina pectoris: Secondary | ICD-10-CM

## 2013-06-04 LAB — POCT CBC
GRANULOCYTE PERCENT: 71.5 % (ref 37–80)
HEMATOCRIT: 37.2 % — AB (ref 43.5–53.7)
Hemoglobin: 12 g/dL — AB (ref 14.1–18.1)
Lymph, poc: 1.9 (ref 0.6–3.4)
MCH, POC: 28.8 pg (ref 27–31.2)
MCHC: 32.4 g/dL (ref 31.8–35.4)
MCV: 88.9 fL (ref 80–97)
MPV: 9 fL (ref 0–99.8)
POC Granulocyte: 5.6 (ref 2–6.9)
POC LYMPH %: 23.8 % (ref 10–50)
Platelet Count, POC: 188 10*3/uL (ref 142–424)
RBC: 4.2 M/uL — AB (ref 4.69–6.13)
RDW, POC: 13.5 %
WBC: 7.9 10*3/uL (ref 4.6–10.2)

## 2013-06-04 LAB — POCT GLYCOSYLATED HEMOGLOBIN (HGB A1C): Hemoglobin A1C: 5.9

## 2013-06-04 NOTE — Progress Notes (Signed)
Pt came in for labs only 

## 2013-06-06 LAB — BMP8+EGFR
BUN / CREAT RATIO: 14 (ref 10–22)
BUN: 14 mg/dL (ref 8–27)
CO2: 23 mmol/L (ref 18–29)
Calcium: 9 mg/dL (ref 8.6–10.2)
Chloride: 103 mmol/L (ref 97–108)
Creatinine, Ser: 1 mg/dL (ref 0.76–1.27)
GFR calc non Af Amer: 72 mL/min/{1.73_m2} (ref >59)
GFR, EST AFRICAN AMERICAN: 83 mL/min/{1.73_m2} (ref >59)
Glucose: 103 mg/dL — ABNORMAL HIGH (ref 65–99)
Potassium: 4 mmol/L (ref 3.5–5.2)
Sodium: 141 mmol/L (ref 134–144)

## 2013-06-06 LAB — NMR, LIPOPROFILE
CHOLESTEROL: 118 mg/dL (ref <200)
HDL Cholesterol by NMR: 49 mg/dL (ref >=40)
HDL PARTICLE NUMBER: 30.3 umol/L — AB (ref >=30.5)
LDL PARTICLE NUMBER: 554 nmol/L (ref <1000)
LDL SIZE: 20.8 nm (ref >20.5)
LDLC SERPL CALC-MCNC: 56 mg/dL (ref <100)
Small LDL Particle Number: 145 nmol/L (ref <=527)
Triglycerides by NMR: 65 mg/dL (ref <150)

## 2013-06-06 LAB — HEPATIC FUNCTION PANEL
ALBUMIN: 3.8 g/dL (ref 3.5–4.8)
ALK PHOS: 122 IU/L — AB (ref 39–117)
ALT: 15 IU/L (ref 0–44)
AST: 24 IU/L (ref 0–40)
Bilirubin, Direct: 0.17 mg/dL (ref 0.00–0.40)
Total Bilirubin: 0.5 mg/dL (ref 0.0–1.2)
Total Protein: 6.6 g/dL (ref 6.0–8.5)

## 2013-06-06 LAB — VITAMIN D 25 HYDROXY (VIT D DEFICIENCY, FRACTURES): VIT D 25 HYDROXY: 35.2 ng/mL (ref 30.0–100.0)

## 2013-06-07 ENCOUNTER — Encounter: Payer: Self-pay | Admitting: *Deleted

## 2013-06-09 ENCOUNTER — Telehealth: Payer: Self-pay | Admitting: Family Medicine

## 2013-06-09 NOTE — Telephone Encounter (Signed)
Message copied by Waverly Ferrari on Wed Jun 09, 2013  3:42 PM ------      Message from: Chipper Herb      Created: Sun Jun 06, 2013  2:32 PM       The blood sugar slightly elevated at 103. The creatinine and the remainder of the kidney function tests including electrolytes are within normal limits      Liver function tests are within normal limits except 1 is slightly elevated at 122 but this is okay.      All cholesterol numbers are excellent and at goal      The vitamin D level is at the low end of the normal range. Make sure that patient is taking vitamin D and if he is he should increase the dose by 1000 D3 ------

## 2013-06-14 ENCOUNTER — Other Ambulatory Visit: Payer: Self-pay | Admitting: Family Medicine

## 2013-09-07 ENCOUNTER — Ambulatory Visit: Payer: Commercial Managed Care - HMO | Admitting: Family Medicine

## 2013-09-10 ENCOUNTER — Other Ambulatory Visit: Payer: Self-pay | Admitting: Family Medicine

## 2013-09-14 ENCOUNTER — Ambulatory Visit: Payer: Commercial Managed Care - HMO | Admitting: Family Medicine

## 2013-09-16 ENCOUNTER — Telehealth: Payer: Self-pay | Admitting: Family Medicine

## 2013-09-16 DIAGNOSIS — L989 Disorder of the skin and subcutaneous tissue, unspecified: Secondary | ICD-10-CM

## 2013-09-17 NOTE — Telephone Encounter (Signed)
Patient aware that referral placed and that it may take up to a week to hear from the referral department.

## 2013-10-03 ENCOUNTER — Other Ambulatory Visit: Payer: Self-pay | Admitting: Family Medicine

## 2013-11-01 ENCOUNTER — Other Ambulatory Visit: Payer: Self-pay | Admitting: Family Medicine

## 2013-11-02 NOTE — Telephone Encounter (Signed)
Last ov 6/15. Last AIC 3/15-5.9%.

## 2013-11-25 ENCOUNTER — Ambulatory Visit (INDEPENDENT_AMBULATORY_CARE_PROVIDER_SITE_OTHER): Payer: Commercial Managed Care - HMO | Admitting: Family Medicine

## 2013-11-25 ENCOUNTER — Encounter: Payer: Self-pay | Admitting: Family Medicine

## 2013-11-25 VITALS — BP 142/69 | HR 58 | Temp 97.0°F | Ht 71.0 in | Wt 177.0 lb

## 2013-11-25 DIAGNOSIS — E559 Vitamin D deficiency, unspecified: Secondary | ICD-10-CM

## 2013-11-25 DIAGNOSIS — N400 Enlarged prostate without lower urinary tract symptoms: Secondary | ICD-10-CM | POA: Diagnosis not present

## 2013-11-25 DIAGNOSIS — D509 Iron deficiency anemia, unspecified: Secondary | ICD-10-CM

## 2013-11-25 DIAGNOSIS — Z87898 Personal history of other specified conditions: Secondary | ICD-10-CM

## 2013-11-25 DIAGNOSIS — E785 Hyperlipidemia, unspecified: Secondary | ICD-10-CM

## 2013-11-25 DIAGNOSIS — E119 Type 2 diabetes mellitus without complications: Secondary | ICD-10-CM

## 2013-11-25 LAB — POCT CBC
Granulocyte percent: 73.5 %G (ref 37–80)
HCT, POC: 38.7 % — AB (ref 43.5–53.7)
HEMOGLOBIN: 12.7 g/dL — AB (ref 14.1–18.1)
LYMPH, POC: 1.9 (ref 0.6–3.4)
MCH, POC: 28.9 pg (ref 27–31.2)
MCHC: 32.7 g/dL (ref 31.8–35.4)
MCV: 88.3 fL (ref 80–97)
MPV: 9.2 fL (ref 0–99.8)
PLATELET COUNT, POC: 168 10*3/uL (ref 142–424)
POC Granulocyte: 5.7 (ref 2–6.9)
POC LYMPH PERCENT: 24.9 %L (ref 10–50)
RBC: 4.4 M/uL — AB (ref 4.69–6.13)
RDW, POC: 13.7 %
WBC: 7.7 10*3/uL (ref 4.6–10.2)

## 2013-11-25 LAB — POCT GLYCOSYLATED HEMOGLOBIN (HGB A1C)

## 2013-11-25 NOTE — Patient Instructions (Addendum)
Medicare Annual Wellness Visit  [facility] and the medical providers at [facility] strive to bring you the best medical care.  In doing so we not only want to address your current medical conditions and concerns but also to detect new conditions early and prevent illness, disease and health-related problems.    Medicare offers a yearly Wellness Visit which allows our clinical staff to assess your need for preventative services including immunizations, lifestyle education, counseling to decrease risk of preventable diseases and screening for fall risk and other medical concerns.    This visit is provided free of charge (no copay) for all Medicare recipients. The clinical pharmacists at [facility] have begun to conduct these Wellness Visits which will also include a thorough review of all your medications.    As you primary medical provider recommend that you make an appointment for your Annual Wellness Visit if you have not done so already this year.  You may set up this appointment before you leave today or you may call back [phone]) and schedule an appointment.  Please make sure when you call that you mention that you are scheduling your Annual Wellness Visit with the clinical pharmacist so that the appointment may be made for the proper length of time.      Continue current medications. Continue good therapeutic lifestyle changes which include good diet and exercise. Fall precautions discussed with patient. If an FOBT was given today- please return it to our front desk. If you are over 42 years old - you may need Prevnar 53 or the adult Pneumonia vaccine.  Flu Shots will be available at our office starting mid- September. Please call and schedule a FLU CLINIC APPOINTMENT.   Continue to exercise regularly Check blood sugars and blood pressures more regularly Bring readings in for review to your next visit Wear a wrist  brace on your right hand at nighttime while sleeping

## 2013-11-25 NOTE — Addendum Note (Signed)
Addended by: Pollyann Kennedy F on: 11/25/2013 10:26 AM   Modules accepted: Orders

## 2013-11-25 NOTE — Progress Notes (Signed)
Subjective:    Patient ID: [name], male    DOB: July 20, 1932, 80 y.o.   MRN: [id number]  HPI Pt here for follow up and management of chronic medical problems. The patient has some right hand numbness and tingling . The patient comes to the visit today with his wife. He has a history of coronary artery bypass grafting. His cardiologist soon. He is up-to-date on his eye exam. His home blood pressure are running in the 120s over the 80s. He checks his blood sugars at and most recently it was 108 fasting.        Patient Active Problem List   Diagnosis Date Noted  . Orthostatic hypotension 06/22/2012  . Dizzy 06/22/2012  . Precordial pain 09/18/2011  . Unstable angina 07/24/2011  . CAD (coronary artery disease) 01/30/2011  . COLONIC POLYPS 06/23/2008  . DM 06/23/2008  . HYPERLIPIDEMIA-MIXED 06/23/2008  . AORTIC STENOSIS 06/23/2008  . BENIGN PROSTATIC HYPERTROPHY, HX OF 06/23/2008   Outpatient Encounter Prescriptions as of 11/25/2013  Medication Sig  . aspirin 81 MG tablet Take 81 mg by mouth daily.    Marland Kitchen atorvastatin (LIPITOR) 80 MG tablet TAKE ONE TABLET BY MOUTH ONE TIME DAILY  . FeFum-FePoly-FA-B Cmp-C-Biot (FOLIVANE-PLUS) CAPS TAKE ONE CAPSULE BY MOUTH ONE TIME DAILY  . ferrous sulfate 325 (65 FE) MG tablet Take 325 mg by mouth daily with breakfast.  . glucose blood (FREESTYLE LITE) test strip Test once daily. Dx 250.0  . lisinopril (PRINIVIL,ZESTRIL) 10 MG tablet TAKE ONE TABLET BY MOUTH ONE TIME DAILY  . metFORMIN (GLUCOPHAGE) 500 MG tablet TAKE ONE TABLET BY MOUTH TWICE DAILY  . metoprolol succinate (TOPROL-XL) 25 MG 24 hr tablet Take 25 mg by mouth daily.    . nitroGLYCERIN (NITROSTAT) 0.4 MG SL tablet Place 1 tablet (0.4 mg total) under the tongue every 5 (five) minutes x 3 doses as needed for chest pain.    Review of Systems  Constitutional: Negative.   HENT: Negative.   Eyes: Negative.   Respiratory: Negative.   Cardiovascular: Negative.   Gastrointestinal:  Negative.   Endocrine: Negative.   Genitourinary: Negative.   Musculoskeletal: Positive for myalgias (right hand numbness).  Skin: Negative.   Allergic/Immunologic: Negative.   Neurological: Negative.   Hematological: Negative.   Psychiatric/Behavioral: Negative.        Objective:   Physical Exam  Nursing note and vitals reviewed. Constitutional: He is oriented to person, place, and time. He appears well-developed and well-nourished. No distress.  The patient is alert pleasant and cooperative.  HENT:  Head: Normocephalic and atraumatic.  Right Ear: External ear normal.  Left Ear: External ear normal.  Nose: Nose normal.  Mouth/Throat: Oropharynx is clear and moist. No oropharyngeal exudate.  Eyes: Conjunctivae and EOM are normal. Pupils are equal, round, and reactive to light. Right eye exhibits no discharge. Left eye exhibits no discharge. No scleral icterus.  Neck: Normal range of motion. Neck supple. No thyromegaly present.  There are carotid bruits the these are most likely secondary to referral from his cardiac murmur.  Cardiovascular: Normal rate, regular rhythm and intact distal pulses.  Exam reveals no gallop and no friction rub.   Murmur heard. Grade 3/6 systolic ejection murmur  Pulmonary/Chest: Effort normal and breath sounds normal. No respiratory distress. He has no wheezes. He has no rales. He exhibits no tenderness.  No axillary nodes  Abdominal: Soft. Bowel sounds are normal. He exhibits no mass. There is no tenderness. There is no rebound and no  guarding.  No abdominal bruits or tenderness  Genitourinary: Rectum normal.  Musculoskeletal: Normal range of motion. He exhibits no edema and no tenderness.  Lymphadenopathy:    He has no cervical adenopathy.  Neurological: He is alert and oriented to person, place, and time. He has normal reflexes. No cranial nerve deficit.  Skin: Skin is warm and dry. No rash noted. No erythema. No pallor.  Psychiatric: He has a  normal mood and affect. His behavior is normal. Judgment and thought content normal.   BP 142/69  Pulse 58  Temp(Src) 97 F (36.1 C) (Oral)  Ht _0  (1.803 m)  Wt 177 lb (80.287 kg)  BMI 24.70 kg/m2        Assessment & Plan:  1. BENIGN PROSTATIC HYPERTROPHY, HX OF - POCT CBC  2. DM - POCT glycosylated hemoglobin (Hb A1C) - POCT CBC - BMP8+EGFR  3. HYPERLIPIDEMIA-MIXED - POCT CBC - BMP8+EGFR - Hepatic function panel - NMR, lipoprofile  4. Vitamin D deficiency - Vit D  25 hydroxy (rtn osteoporosis monitoring)  5. Anemia, iron deficiency  No orders of the defined types were placed in this encounter.   Patient Instructions                       Medicare Annual Wellness Visit  [facility] and the medical providers at [facility] strive to bring you the best medical care.  In doing so we not only want to address your current medical conditions and concerns but also to detect new conditions early and prevent illness, disease and health-related problems.    Medicare offers a yearly Wellness Visit which allows our clinical staff to assess your need for preventative services including immunizations, lifestyle education, counseling to decrease risk of preventable diseases and screening for fall risk and other medical concerns.    This visit is provided free of charge (no copay) for all Medicare recipients. The clinical pharmacists at [facility] have begun to conduct these Wellness Visits which will also include a thorough review of all your medications.    As you primary medical provider recommend that you make an appointment for your Annual Wellness Visit if you have not done so already this year.  You may set up this appointment before you leave today or you may call back ([phone]) and schedule an appointment.  Please make sure when you call that you mention that you are scheduling your Annual Wellness Visit with the clinical  pharmacist so that the appointment may be made for the proper length of time.      Continue current medications. Continue good therapeutic lifestyle changes which include good diet and exercise. Fall precautions discussed with patient. If an FOBT was given today- please return it to our front desk. If you are over 42 years old - you may need Prevnar 24 or the adult Pneumonia vaccine.  Flu Shots will be available at our office starting mid- September. Please call and schedule a FLU CLINIC APPOINTMENT.   Continue to exercise regularly Check blood sugars and blood pressures more regularly Bring readings in for review to your next visit Wear a wrist brace on your right hand at nighttime while sleeping   Arrie Senate MD

## 2013-11-26 LAB — NMR, LIPOPROFILE
CHOLESTEROL: 140 mg/dL (ref 100–199)
HDL CHOLESTEROL BY NMR: 54 mg/dL (ref >39)
HDL Particle Number: 32.3 umol/L (ref >=30.5)
LDL Particle Number: 634 nmol/L (ref <1000)
LDL Size: 20.7 nm (ref >20.5)
LDLC SERPL CALC-MCNC: 69 mg/dL (ref 0–99)
LP-IR Score: 35 (ref <=45)
Small LDL Particle Number: 194 nmol/L (ref <=527)
Triglycerides by NMR: 84 mg/dL (ref 0–149)

## 2013-11-26 LAB — HEPATIC FUNCTION PANEL
ALBUMIN: 3.9 g/dL (ref 3.5–4.8)
ALT: 17 IU/L (ref 0–44)
AST: 23 IU/L (ref 0–40)
Alkaline Phosphatase: 119 IU/L — ABNORMAL HIGH (ref 39–117)
Bilirubin, Direct: 0.15 mg/dL (ref 0.00–0.40)
TOTAL PROTEIN: 6.7 g/dL (ref 6.0–8.5)
Total Bilirubin: 0.5 mg/dL (ref 0.0–1.2)

## 2013-11-26 LAB — FERRITIN: FERRITIN: 25 ng/mL — AB (ref 30–400)

## 2013-11-26 LAB — BMP8+EGFR
BUN/Creatinine Ratio: 15 (ref 10–22)
BUN: 15 mg/dL (ref 8–27)
CALCIUM: 9.4 mg/dL (ref 8.6–10.2)
CHLORIDE: 102 mmol/L (ref 97–108)
CO2: 26 mmol/L (ref 18–29)
Creatinine, Ser: 0.98 mg/dL (ref 0.76–1.27)
GFR calc non Af Amer: 73 mL/min/{1.73_m2} (ref >59)
GFR, EST AFRICAN AMERICAN: 84 mL/min/{1.73_m2} (ref >59)
Glucose: 110 mg/dL — ABNORMAL HIGH (ref 65–99)
Potassium: 4.6 mmol/L (ref 3.5–5.2)
Sodium: 142 mmol/L (ref 134–144)

## 2013-11-26 LAB — VITAMIN D 25 HYDROXY (VIT D DEFICIENCY, FRACTURES): Vit D, 25-Hydroxy: 37.5 ng/mL (ref 30.0–100.0)

## 2013-12-01 ENCOUNTER — Telehealth: Payer: Self-pay | Admitting: Family Medicine

## 2013-12-01 NOTE — Telephone Encounter (Signed)
Message copied by Waverly Ferrari on Wed Dec 01, 2013 11:22 AM ------      Message from: Chipper Herb      Created: Fri Nov 26, 2013  3:41 PM       The blood sugar is elevated at 110. The creatinine, the most important kidney function test is within normal limits. The electrolytes including potassium are within normal limits.      1 liver function tests is slightly elevated and this is consistent with past readings.      All cholesterol numbers are excellent and at goal, continue current treatment and as aggressive therapeutic lifestyle changes as possible      The vitamin D level is within normal limits continue current treatment      The ferritin level remains low, continue iron treatment ------

## 2013-12-06 ENCOUNTER — Telehealth: Payer: Self-pay | Admitting: Family Medicine

## 2013-12-06 NOTE — Telephone Encounter (Signed)
Message copied by Cline Crock on Mon Dec 06, 2013 12:00 PM ------      Message from: Chipper Herb      Created: Fri Nov 26, 2013  3:41 PM       The blood sugar is elevated at 110. The creatinine, the most important kidney function test is within normal limits. The electrolytes including potassium are within normal limits.      1 liver function tests is slightly elevated and this is consistent with past readings.      All cholesterol numbers are excellent and at goal, continue current treatment and as aggressive therapeutic lifestyle changes as possible      The vitamin D level is within normal limits continue current treatment      The ferritin level remains low, continue iron treatment ------

## 2013-12-07 NOTE — Telephone Encounter (Signed)
Patient aware.

## 2014-01-05 ENCOUNTER — Other Ambulatory Visit: Payer: Self-pay | Admitting: Family Medicine

## 2014-02-02 ENCOUNTER — Encounter: Payer: Self-pay | Admitting: Cardiology

## 2014-02-02 ENCOUNTER — Ambulatory Visit (INDEPENDENT_AMBULATORY_CARE_PROVIDER_SITE_OTHER): Payer: Commercial Managed Care - HMO | Admitting: Cardiology

## 2014-02-02 VITALS — BP 122/58 | HR 94 | Ht 71.0 in | Wt 182.0 lb

## 2014-02-02 DIAGNOSIS — I2510 Atherosclerotic heart disease of native coronary artery without angina pectoris: Secondary | ICD-10-CM

## 2014-02-02 DIAGNOSIS — I359 Nonrheumatic aortic valve disorder, unspecified: Secondary | ICD-10-CM

## 2014-02-02 NOTE — Progress Notes (Signed)
HPI The patient presents for followup of his aortic stenosis s/p aortic valve replacement.  Since I last saw him he has done well. He still walks every day. He's not having as much lightheadedness as he described previously. He denies any chest pressure, neck or arm discomfort. He's had no palpitations, presyncope or syncope. He denies any shortness of breath, PND or orthopnea.   No Known Allergies  Current Outpatient Prescriptions  Medication Sig Dispense Refill  . aspirin 81 MG tablet Take 81 mg by mouth daily.      Marland Kitchen atorvastatin (LIPITOR) 80 MG tablet TAKE ONE TABLET BY MOUTH ONE TIME DAILY 30 tablet 5  . FeFum-FePoly-FA-B Cmp-C-Biot (FOLIVANE-PLUS) CAPS TAKE ONE CAPSULE BY MOUTH ONE TIME DAILY 30 capsule 3  . ferrous sulfate 325 (65 FE) MG tablet Take 325 mg by mouth daily with breakfast.    . lisinopril (PRINIVIL,ZESTRIL) 10 MG tablet TAKE ONE TABLET BY MOUTH ONE TIME DAILY 30 tablet 4  . metFORMIN (GLUCOPHAGE) 500 MG tablet TAKE ONE TABLET BY MOUTH TWICE DAILY 60 tablet 1  . metoprolol succinate (TOPROL-XL) 25 MG 24 hr tablet Take 25 mg by mouth daily.      . nitroGLYCERIN (NITROSTAT) 0.4 MG SL tablet Place 1 tablet (0.4 mg total) under the tongue every 5 (five) minutes x 3 doses as needed for chest pain. 25 tablet 4  . glucose blood (FREESTYLE LITE) test strip Test once daily. Dx 250.0 100 each 0   No current facility-administered medications for this visit.    Past Medical History  Diagnosis Date  . Diabetes mellitus   . Aortic stenosis     s/p pericardial AVR  . Hyperlipidemia   . Benign prostatic hypertrophy   . Coronary artery disease     s/p CABG 08/2010    . Arthritis     Past Surgical History  Procedure Laterality Date  . Knee replacements      x 2  . Hernia repair    . Aortic valve replacement  08/24/10    80mm pericardial tissue valve  . Coronary artery bypass graft  08/24/10    LIMA to LAD, SVG to ramus intermediate, SVG to diagonal    ROS:  As stated in  the HPI and negative for all other systems.  PHYSICAL EXAM BP 122/58 mmHg  Pulse 94  Ht 5\' 11"  (1.803 m)  Wt 182 lb (82.555 kg)  BMI 25.40 kg/m2 GENERAL:  Well appearing HEENT:  Pupils equal round and reactive, fundi not visualized, oral mucosa unremarkable NECK:  No jugular venous distention, waveform within normal limits, carotid upstroke brisk and symmetric, no bruits, no thyromegaly LYMPHATICS:  No cervical, inguinal adenopathy LUNGS:  Clear to auscultation bilaterally BACK:  No CVA tenderness CHEST:  Well healed sternotomy scar, prominent sternal wires HEART:  PMI not displaced or sustained,S1 and S2 within normal limits, no S3, no S4, no clicks, no rubs, apical systolic murmur radiating to the axilla and slightly out the aortic outflow tract and into the carotid mid peaking ABD:  Flat, positive bowel sounds normal in frequency in pitch, no bruits, no rebound, no guarding, no midline pulsatile mass, no hepatomegaly, no splenomegaly EXT:  2+ pulses, no edema.  EKG:  Sinus rhythm, rate 94, left axis deviation, no acute ST T wave changes, IVCD. No significant change from previous EKG.   02/02/2014  ASSESSMENT AND PLAN  CAD:  He has had no new symptoms since his last stress test. He will continue with risk  reduction.  AS s/p AVR:   This was stable on his echo last year.  He has no symptoms and remains very active. No change in therapy is indicated.  I will likely followup with an echo next year he did have a mild valve gradient.  HTN:  No change in therapy is indicated.  DYSLIPIDEMIA:  His lipid profile was or excellent.  He will will continue meds as listed. To or  Lab Results  Component Value Date   CHOL 140 11/25/2013   TRIG 84 11/25/2013   HDL 54 11/25/2013   LDLCALC 69 11/25/2013   DM:  He has an excellent A1C as below  Lab Results  Component Value Date   HGBA1C 5.9% 11/25/2013

## 2014-02-02 NOTE — Patient Instructions (Signed)
The current medical regimen is effective;  continue present plan and medications.  Follow up in 1 year with Dr. Hochrein in [place].  You will receive a letter in the mail 2 months before you are due.  Please call us when you receive this letter to schedule your follow up appointment.  

## 2014-02-11 ENCOUNTER — Ambulatory Visit (INDEPENDENT_AMBULATORY_CARE_PROVIDER_SITE_OTHER): Payer: Commercial Managed Care - HMO | Admitting: *Deleted

## 2014-02-11 ENCOUNTER — Ambulatory Visit: Payer: Commercial Managed Care - HMO

## 2014-02-11 DIAGNOSIS — Z23 Encounter for immunization: Secondary | ICD-10-CM | POA: Diagnosis not present

## 2014-02-20 ENCOUNTER — Other Ambulatory Visit: Payer: Self-pay | Admitting: Family Medicine

## 2014-03-08 ENCOUNTER — Other Ambulatory Visit: Payer: Self-pay | Admitting: Family Medicine

## 2014-04-08 ENCOUNTER — Other Ambulatory Visit: Payer: Self-pay | Admitting: Nurse Practitioner

## 2014-04-11 ENCOUNTER — Ambulatory Visit (INDEPENDENT_AMBULATORY_CARE_PROVIDER_SITE_OTHER): Payer: Commercial Managed Care - HMO | Admitting: Family Medicine

## 2014-04-11 ENCOUNTER — Encounter: Payer: Self-pay | Admitting: Family Medicine

## 2014-04-11 VITALS — BP 133/72 | HR 73 | Temp 97.0°F | Ht 71.0 in | Wt 177.0 lb

## 2014-04-11 DIAGNOSIS — D509 Iron deficiency anemia, unspecified: Secondary | ICD-10-CM | POA: Diagnosis not present

## 2014-04-11 DIAGNOSIS — E119 Type 2 diabetes mellitus without complications: Secondary | ICD-10-CM | POA: Diagnosis not present

## 2014-04-11 DIAGNOSIS — E559 Vitamin D deficiency, unspecified: Secondary | ICD-10-CM

## 2014-04-11 DIAGNOSIS — E785 Hyperlipidemia, unspecified: Secondary | ICD-10-CM

## 2014-04-11 DIAGNOSIS — I10 Essential (primary) hypertension: Secondary | ICD-10-CM

## 2014-04-11 LAB — POCT CBC
GRANULOCYTE PERCENT: 74.2 % (ref 37–80)
HEMATOCRIT: 39.7 % — AB (ref 43.5–53.7)
Hemoglobin: 12.1 g/dL — AB (ref 14.1–18.1)
Lymph, poc: 1.8 (ref 0.6–3.4)
MCH, POC: 26.9 pg — AB (ref 27–31.2)
MCHC: 30.6 g/dL — AB (ref 31.8–35.4)
MCV: 88.1 fL (ref 80–97)
MPV: 8.8 fL (ref 0–99.8)
POC Granulocyte: 5.7 (ref 2–6.9)
POC LYMPH PERCENT: 23.2 %L (ref 10–50)
Platelet Count, POC: 196 10*3/uL (ref 142–424)
RBC: 4.5 M/uL — AB (ref 4.69–6.13)
RDW, POC: 13.4 %
WBC: 7.7 10*3/uL (ref 4.6–10.2)

## 2014-04-11 LAB — POCT UA - MICROALBUMIN: MICROALBUMIN (UR) POC: NEGATIVE mg/L

## 2014-04-11 LAB — POCT GLYCOSYLATED HEMOGLOBIN (HGB A1C): Hemoglobin A1C: 6.1

## 2014-04-11 NOTE — Progress Notes (Signed)
Subjective:    Patient ID: [name], male    DOB: 05-31-1932, 81 y.o.   MRN: [id number]  HPI Pt here for follow up and management of chronic medical problems which include diabetes, hyperlipidemia, and anemia. He is taking all medications regularly. The patient recently saw the cardiologist in November. Please see that note from November 11 with plans to follow-up again in one year. The patient is due for rectal exam today and will be given an FOBT to return. He'll also have his lab work today for his kidney function cholesterol liver function and CBC. He will also give [place] a urinalysis today. He plans to get his eye exam and the next couple weeks. The patient is alert and young looking for his stated age of 56 years. Also the patient monitors his blood sugars at home and maybe running in the 90s.        Patient Active Problem List   Diagnosis Date Noted  . Anemia, iron deficiency 04/11/2014  . Orthostatic hypotension 06/22/2012  . Dizzy 06/22/2012  . Precordial pain 09/18/2011  . Unstable angina 07/24/2011  . CAD (coronary artery disease) 01/30/2011  . COLONIC POLYPS 06/23/2008  . DM 06/23/2008  . HYPERLIPIDEMIA-MIXED 06/23/2008  . Aortic valve disorder 06/23/2008  . BENIGN PROSTATIC HYPERTROPHY, HX OF 06/23/2008   Outpatient Encounter Prescriptions as of 04/11/2014  Medication Sig  . aspirin 81 MG tablet Take 81 mg by mouth daily.    Marland Kitchen atorvastatin (LIPITOR) 80 MG tablet TAKE ONE TABLET BY MOUTH ONE TIME DAILY  . FeFum-FePoly-FA-B Cmp-C-Biot (FOLIVANE-PLUS) CAPS TAKE ONE CAPSULE BY MOUTH ONE TIME DAILY  . glucose blood (FREESTYLE LITE) test strip Test once daily. Dx 250.0  . lisinopril (PRINIVIL,ZESTRIL) 10 MG tablet TAKE ONE TABLET BY MOUTH ONE TIME DAILY  . metFORMIN (GLUCOPHAGE) 500 MG tablet TAKE ONE TABLET BY MOUTH TWICE DAILY  . metoprolol succinate (TOPROL-XL) 25 MG 24 hr tablet Take 25 mg by mouth daily.    . nitroGLYCERIN (NITROSTAT) 0.4 MG SL tablet Place 1 tablet  (0.4 mg total) under the tongue every 5 (five) minutes x 3 doses as needed for chest pain. (Patient not taking: Reported on 04/11/2014)  . [DISCONTINUED] ferrous sulfate 325 (65 FE) MG tablet Take 325 mg by mouth daily with breakfast.     Review of Systems  Constitutional: Negative.   HENT: Negative.   Eyes: Negative.   Respiratory: Negative.   Cardiovascular: Negative.   Gastrointestinal: Negative.   Endocrine: Negative.   Genitourinary: Negative.   Musculoskeletal: Negative.   Skin: Negative.   Allergic/Immunologic: Negative.   Neurological: Negative.   Hematological: Negative.   Psychiatric/Behavioral: Negative.        Objective:   Physical Exam  Constitutional: He is oriented to person, place, and time. He appears well-developed and well-nourished. No distress.  HENT:  Head: Normocephalic and atraumatic.  Right Ear: External ear normal.  Left Ear: External ear normal.  Nose: Nose normal.  Mouth/Throat: Oropharynx is clear and moist. No oropharyngeal exudate.  Eyes: Conjunctivae and EOM are normal. Pupils are equal, round, and reactive to light. Right eye exhibits no discharge. Left eye exhibits no discharge. No scleral icterus.  Neck: Normal range of motion. Neck supple. No tracheal deviation present. No thyromegaly present.  No anterior cervical adenopathy or thyromegaly. He does have carotid bruits bilaterally secondary to cardiovascular surgery for aortic valve replacement and aortic stenosis.  Cardiovascular: Normal rate, regular rhythm and intact distal pulses.  Exam reveals no gallop  and no friction rub.   Murmur heard. The rhythm is regular at 72/m and he has a grade 3/6 systolic ejection murmur  Pulmonary/Chest: Effort normal and breath sounds normal. No respiratory distress. He has no wheezes. He has no rales. He exhibits no tenderness.  The chest wall has a midline incision status post his aortic valve replacement and aortic stenosis surgery. The patient is  somewhat barrel chested. He has good breath sounds bilaterally with no wheezing.  Abdominal: Soft. Bowel sounds are normal. He exhibits no mass. There is no tenderness. There is no rebound and no guarding.  Genitourinary: Rectum normal, prostate normal and penis normal.  Musculoskeletal: Normal range of motion. He exhibits no edema or tenderness.  He has bilateral knee replacement by Dr. Reynaldo Minium  Lymphadenopathy:    He has no cervical adenopathy.  Neurological: He is alert and oriented to person, place, and time. He has normal reflexes. No cranial nerve deficit.  Skin: Skin is warm and dry. Rash noted. No erythema. No pallor.  The patient has a rash on both lower extremities which appears to be a contact dermatitis with pruritic areas from scratching.  Psychiatric: He has a normal mood and affect. His behavior is normal. Judgment and thought content normal.  Nursing note and vitals reviewed.  BP 133/72 mmHg  Pulse 73  Temp(Src) 97 F (36.1 C) (Oral)  Ht _0  (1.803 m)  Wt 177 lb (80.287 kg)  BMI 24.70 kg/m2        Assessment & Plan:  1. Vitamin D deficiency -Continue current vitamin D until lab work is returned and we will adjusted at that time if necessary - POCT CBC - Vit D  25 hydroxy (rtn osteoporosis monitoring)  2. Anemia, iron deficiency -Continue current regimen and we will make adjustments when lab work is returned - POCT CBC  3. Essential hypertension -Continue lisinopril - POCT CBC - BMP8+EGFR - Hepatic function panel  4. Type 2 diabetes mellitus without complication -Continue metformin 500 twice daily - POCT CBC - POCT glycosylated hemoglobin (Hb A1C) - POCT UA - Microalbumin  5. Hyperlipidemia -Continue aggressive therapeutic lifestyle changes and atorvastatin 40 mg once daily - POCT CBC - NMR, lipoprofile   Patient Instructions                       Medicare Annual Wellness Visit  [facility] and the medical providers at [facility] strive to bring you the best medical care.  In doing so we not only want to address your current medical conditions and concerns but also to detect new conditions early and prevent illness, disease and health-related problems.    Medicare offers a yearly Wellness Visit which allows our clinical staff to assess your need for preventative services including immunizations, lifestyle education, counseling to decrease risk of preventable diseases and screening for fall risk and other medical concerns.    This visit is provided free of charge (no copay) for all Medicare recipients. The clinical pharmacists at [facility] have begun to conduct these Wellness Visits which will also include a thorough review of all your medications.    As you primary medical provider recommend that you make an appointment for your Annual Wellness Visit if you have not done so already this year.  You may set up this appointment before you leave today or you may call back ([phone]) and schedule an appointment.  Please make sure when you call that  you mention that you are scheduling your Annual Wellness Visit with the clinical pharmacist so that the appointment may be made for the proper length of time.    Continue current medications. Continue good therapeutic lifestyle changes which include good diet and exercise. Fall precautions discussed with patient. If an FOBT was given today- please return it to our front desk. If you are over 38 years old - you may need Prevnar 72 or the adult Pneumonia vaccine.  Flu Shots will be available at our office starting mid- September. Please call and schedule a FLU CLINIC APPOINTMENT.   Use cortisone 10 sparingly on both legs twice daily for a couple weeks to see if this relieves the itching Avoid [place] and [employer], continue to use [place] Use all dermatology approved detergent We will call you with your  lab work results once those results become available Return the FOBT continue current medications Continue to check feet regularly and monitor blood sugars     Arrie Senate MD

## 2014-04-11 NOTE — Patient Instructions (Addendum)
Medicare Annual Wellness Visit  [facility] and the medical providers at [facility] strive to bring you the best medical care.  In doing so we not only want to address your current medical conditions and concerns but also to detect new conditions early and prevent illness, disease and health-related problems.    Medicare offers a yearly Wellness Visit which allows our clinical staff to assess your need for preventative services including immunizations, lifestyle education, counseling to decrease risk of preventable diseases and screening for fall risk and other medical concerns.    This visit is provided free of charge (no copay) for all Medicare recipients. The clinical pharmacists at [facility] have begun to conduct these Wellness Visits which will also include a thorough review of all your medications.    As you primary medical provider recommend that you make an appointment for your Annual Wellness Visit if you have not done so already this year.  You may set up this appointment before you leave today or you may call back ([phone]) and schedule an appointment.  Please make sure when you call that you mention that you are scheduling your Annual Wellness Visit with the clinical pharmacist so that the appointment may be made for the proper length of time.    Continue current medications. Continue good therapeutic lifestyle changes which include good diet and exercise. Fall precautions discussed with patient. If an FOBT was given today- please return it to our front desk. If you are over 18 years old - you may need Prevnar 28 or the adult Pneumonia vaccine.  Flu Shots will be available at our office starting mid- September. Please call and schedule a FLU CLINIC APPOINTMENT.   Use cortisone 10 sparingly on both legs twice daily for a couple weeks to see if this relieves the itching Avoid [place] and [employer],  continue to use Child psychotherapist Use all dermatology approved detergent We will call you with your lab work results once those results become available Return the FOBT continue current medications Continue to check feet regularly and monitor blood sugars

## 2014-04-12 LAB — BMP8+EGFR
BUN / CREAT RATIO: 11 (ref 10–22)
BUN: 10 mg/dL (ref 8–27)
CALCIUM: 9.1 mg/dL (ref 8.6–10.2)
CO2: 25 mmol/L (ref 18–29)
Chloride: 103 mmol/L (ref 97–108)
Creatinine, Ser: 0.94 mg/dL (ref 0.76–1.27)
GFR calc Af Amer: 89 mL/min/{1.73_m2} (ref >59)
GFR calc non Af Amer: 77 mL/min/{1.73_m2} (ref >59)
GLUCOSE: 105 mg/dL — AB (ref 65–99)
POTASSIUM: 3.8 mmol/L (ref 3.5–5.2)
Sodium: 142 mmol/L (ref 134–144)

## 2014-04-12 LAB — NMR, LIPOPROFILE
Cholesterol: 98 mg/dL — ABNORMAL LOW (ref 100–199)
HDL Cholesterol by NMR: 45 mg/dL (ref >39)
HDL PARTICLE NUMBER: 28.7 umol/L — AB (ref >=30.5)
LDL PARTICLE NUMBER: 345 nmol/L (ref <1000)
LDL SIZE: 20.5 nm (ref >20.5)
LDL-C: 43 mg/dL (ref 0–99)
LP-IR Score: <25 (ref <=45)
Small LDL Particle Number: 181 nmol/L (ref <=527)
TRIGLYCERIDES BY NMR: 51 mg/dL (ref 0–149)

## 2014-04-12 LAB — HEPATIC FUNCTION PANEL
ALT: 21 IU/L (ref 0–44)
AST: 30 IU/L (ref 0–40)
Albumin: 3.9 g/dL (ref 3.5–4.8)
Alkaline Phosphatase: 120 IU/L — ABNORMAL HIGH (ref 39–117)
Bilirubin, Direct: 0.18 mg/dL (ref 0.00–0.40)
Total Bilirubin: 0.5 mg/dL (ref 0.0–1.2)
Total Protein: 6.2 g/dL (ref 6.0–8.5)

## 2014-04-12 LAB — VITAMIN D 25 HYDROXY (VIT D DEFICIENCY, FRACTURES): Vit D, 25-Hydroxy: 41.9 ng/mL (ref 30.0–100.0)

## 2014-08-09 ENCOUNTER — Other Ambulatory Visit: Payer: Self-pay | Admitting: Family Medicine

## 2014-08-14 ENCOUNTER — Other Ambulatory Visit: Payer: Self-pay | Admitting: Family Medicine

## 2014-08-23 ENCOUNTER — Ambulatory Visit (INDEPENDENT_AMBULATORY_CARE_PROVIDER_SITE_OTHER): Payer: Commercial Managed Care - HMO | Admitting: Family Medicine

## 2014-08-23 ENCOUNTER — Encounter: Payer: Self-pay | Admitting: Family Medicine

## 2014-08-23 VITALS — BP 126/72 | HR 64 | Temp 96.8°F | Ht 71.0 in | Wt 178.0 lb

## 2014-08-23 DIAGNOSIS — R002 Palpitations: Secondary | ICD-10-CM | POA: Diagnosis not present

## 2014-08-23 DIAGNOSIS — E119 Type 2 diabetes mellitus without complications: Secondary | ICD-10-CM | POA: Diagnosis not present

## 2014-08-23 DIAGNOSIS — R82998 Other abnormal findings in urine: Secondary | ICD-10-CM

## 2014-08-23 DIAGNOSIS — D509 Iron deficiency anemia, unspecified: Secondary | ICD-10-CM

## 2014-08-23 DIAGNOSIS — N390 Urinary tract infection, site not specified: Secondary | ICD-10-CM | POA: Diagnosis not present

## 2014-08-23 DIAGNOSIS — I10 Essential (primary) hypertension: Secondary | ICD-10-CM

## 2014-08-23 DIAGNOSIS — N400 Enlarged prostate without lower urinary tract symptoms: Secondary | ICD-10-CM | POA: Diagnosis not present

## 2014-08-23 DIAGNOSIS — E559 Vitamin D deficiency, unspecified: Secondary | ICD-10-CM

## 2014-08-23 DIAGNOSIS — E785 Hyperlipidemia, unspecified: Secondary | ICD-10-CM

## 2014-08-23 LAB — POCT CBC
Granulocyte percent: 63.8 %G (ref 37–80)
HCT, POC: 40.8 % — AB (ref 43.5–53.7)
HEMOGLOBIN: 12.7 g/dL — AB (ref 14.1–18.1)
Lymph, poc: 2 (ref 0.6–3.4)
MCH: 28.2 pg (ref 27–31.2)
MCHC: 31.2 g/dL — AB (ref 31.8–35.4)
MCV: 90.4 fL (ref 80–97)
MPV: 8.8 fL (ref 0–99.8)
POC Granulocyte: 4.7 (ref 2–6.9)
POC LYMPH PERCENT: 27.6 %L (ref 10–50)
Platelet Count, POC: 184 10*3/uL (ref 142–424)
RBC: 4.51 M/uL — AB (ref 4.69–6.13)
RDW, POC: 13.1 %
WBC: 7.3 10*3/uL (ref 4.6–10.2)

## 2014-08-23 LAB — POCT URINALYSIS DIPSTICK
Bilirubin, UA: NEGATIVE
GLUCOSE UA: NEGATIVE
Ketones, UA: NEGATIVE
Nitrite, UA: NEGATIVE
PH UA: 6
SPEC GRAV UA: 1.01
Urobilinogen, UA: NEGATIVE

## 2014-08-23 LAB — POCT GLYCOSYLATED HEMOGLOBIN (HGB A1C): Hemoglobin A1C: 6.1

## 2014-08-23 NOTE — Addendum Note (Signed)
Addended by: Earlene Plater on: 08/23/2014 01:32 PM   Modules accepted: Orders

## 2014-08-23 NOTE — Progress Notes (Addendum)
Subjective:    Patient ID: [name], male    DOB: 1932/05/03, 81 y.o.   MRN: [id number]  HPI Pt here for follow up and management of chronic medical problems which includes hypertension, hyperlipidemia, and diabetes. He is taking medications regularly. The patient has been doing well since his last visit. He says his blood sugars are running anywhere between the upper 90s into the low 100s when he checks them. He denies chest pain or shortness of breath. He does complain of palpitations especially at nighttime. He also indicates he drinks a lot of caffeine. He denies problems swallowing and heartburn indigestion and nausea vomiting or diarrhea. He is having no problems with passing his water.     Patient Active Problem List   Diagnosis Date Noted  . Anemia, iron deficiency 04/11/2014  . Orthostatic hypotension 06/22/2012  . Dizzy 06/22/2012  . Precordial pain 09/18/2011  . Unstable angina 07/24/2011  . CAD (coronary artery disease) 01/30/2011  . COLONIC POLYPS 06/23/2008  . DM 06/23/2008  . HYPERLIPIDEMIA-MIXED 06/23/2008  . Aortic valve disorder 06/23/2008  . BENIGN PROSTATIC HYPERTROPHY, HX OF 06/23/2008   Outpatient Encounter Prescriptions as of 08/23/2014  Medication Sig  . aspirin 81 MG tablet Take 81 mg by mouth daily.    Marland Kitchen atorvastatin (LIPITOR) 80 MG tablet TAKE ONE TABLET BY MOUTH ONE TIME DAILY  . FeFum-FePoly-FA-B Cmp-C-Biot (FOLIVANE-PLUS) CAPS TAKE ONE CAPSULE BY MOUTH ONE TIME DAILY  . FREESTYLE LITE test strip USE TO TEST SUGAR ONCE DAILY  . lisinopril (PRINIVIL,ZESTRIL) 10 MG tablet TAKE ONE TABLET BY MOUTH ONE TIME DAILY  . metFORMIN (GLUCOPHAGE) 500 MG tablet TAKE ONE TABLET BY MOUTH TWICE DAILY  . metoprolol succinate (TOPROL-XL) 25 MG 24 hr tablet Take 25 mg by mouth daily.    . nitroGLYCERIN (NITROSTAT) 0.4 MG SL tablet Place 1 tablet (0.4 mg total) under the tongue every 5 (five) minutes x 3 doses as needed for chest pain.   No facility-administered  encounter medications on file as of 08/23/2014.      Review of Systems  Constitutional: Negative.   HENT: Negative.   Eyes: Negative.   Respiratory: Negative.   Cardiovascular: Negative.   Gastrointestinal: Negative.   Endocrine: Negative.   Genitourinary: Negative.   Musculoskeletal: Negative.   Skin: Negative.   Allergic/Immunologic: Negative.   Neurological: Negative.   Hematological: Negative.   Psychiatric/Behavioral: Negative.        Objective:   Physical Exam  Constitutional: He is oriented to person, place, and time. He appears well-developed and well-nourished. No distress.  HENT:  Head: Normocephalic and atraumatic.  Right Ear: External ear normal.  Left Ear: External ear normal.  Nose: Nose normal.  Mouth/Throat: Oropharynx is clear and moist. No oropharyngeal exudate.  Eyes: Conjunctivae and EOM are normal. Pupils are equal, round, and reactive to light. Right eye exhibits no discharge. Left eye exhibits no discharge. No scleral icterus.  Neck: Normal range of motion. Neck supple. No thyromegaly present.  No carotid bruits or thyromegaly  Cardiovascular: Normal rate, regular rhythm and intact distal pulses.  Exam reveals no gallop and no friction rub.   Murmur heard. Grade 1/6 systolic ejection murmur at 72/m  Pulmonary/Chest: Effort normal and breath sounds normal. No respiratory distress. He has no wheezes. He has no rales. He exhibits no tenderness.  Abdominal: Soft. Bowel sounds are normal. He exhibits no mass. There is no tenderness. There is no rebound and no guarding.  Genitourinary: Rectum normal, prostate normal and  penis normal.  The prostate was not enlarged and there were no rectal masses. The external genitalia were normal and there were no inguinal hernias and no inguinal adenopathy.  Musculoskeletal: Normal range of motion. He exhibits no edema or tenderness.  Lymphadenopathy:    He has no cervical adenopathy.  Neurological: He is alert and  oriented to person, place, and time. He has normal reflexes. No cranial nerve deficit.  Skin: Skin is warm and dry. No rash noted.  Psychiatric: He has a normal mood and affect. His behavior is normal. Judgment and thought content normal.  Nursing note and vitals reviewed.  BP 126/72 mmHg  Pulse 64  Temp(Src) 96.8 F (36 C) (Oral)  Ht '5\' 11"'  (1.803 m)  Wt 178 lb (80.74 kg)  BMI 24.84 kg/m2  Results for orders placed or performed in visit on 08/23/14  POCT CBC  Result Value Ref Range   WBC 7.3 4.6 - 10.2 K/uL   Lymph, poc 2.0 0.6 - 3.4   POC LYMPH PERCENT 27.6 10 - 50 %L   POC Granulocyte 4.7 2 - 6.9   Granulocyte percent 63.8 37 - 80 %G   RBC 4.51 (A) 4.69 - 6.13 M/uL   Hemoglobin 12.7 (A) 14.1 - 18.1 g/dL   HCT, POC 40.8 (A) 43.5 - 53.7 %   MCV 90.4 80 - 97 fL   MCH, POC 28.2 27 - 31.2 pg   MCHC 31.2 (A) 31.8 - 35.4 g/dL   RDW, POC 13.1 %   Platelet Count, POC 184 142 - 424 K/uL   MPV 8.8 0 - 99.8 fL  POCT glycosylated hemoglobin (Hb A1C)  Result Value Ref Range   Hemoglobin A1C 6.1   POCT urinalysis dipstick  Result Value Ref Range   Color, UA YELLOW    Clarity, UA CLOUDY    Glucose, UA NEG    Bilirubin, UA NEG    Ketones, UA NEG    Spec Grav, UA 1.010    Blood, UA MOD    pH, UA 6.0    Protein, UA 2++    Urobilinogen, UA negative    Nitrite, UA NEG    Leukocytes, UA large (3+)           Assessment & Plan:  1. Vitamin D deficiency -Patient will continue with current treatment pending results of lab work being done today - POCT CBC - Vit D  25 hydroxy (rtn osteoporosis monitoring)  2. Anemia, iron deficiency -No history of any blood loss recently and he will continue with his current treatment - POCT CBC  3. Essential hypertension -The blood pressure is good today and patient will - POCT CBC - BMP8+EGFR - Hepatic function panel  4. Type 2 diabetes mellitus without complication -Blood sugars have been under good control at home and noted change in  treatment is necessary. He'll continue with his metformin - POCT CBC - POCT glycosylated hemoglobin (Hb A1C)  5. Hyperlipidemia -Lipid profile will be done today and he will continue with atorvastatin pending results of lab work - POCT CBC - NMR, lipoprofile  6. BPH (benign prostatic hyperplasia) -Patient is having no symptoms with voiding and his rectal exam was negative today for prostate enlargement. - PSA, total and free - POCT urinalysis dipstick - POCT UA - Microscopic Only  7. Palpitations -He will reduce his caffeine intake and let us know if this continues to bother him and he will also check with his cardiologist to make sure he has his  regular follow-up appointment  Patient Instructions                       Medicare Annual Wellness Visit  [facility] and the medical providers at [facility] strive to bring you the best medical care.  In doing so we not only want to address your current medical conditions and concerns but also to detect new conditions early and prevent illness, disease and health-related problems.    Medicare offers a yearly Wellness Visit which allows our clinical staff to assess your need for preventative services including immunizations, lifestyle education, counseling to decrease risk of preventable diseases and screening for fall risk and other medical concerns.    This visit is provided free of charge (no copay) for all Medicare recipients. The clinical pharmacists at [facility] have begun to conduct these Wellness Visits which will also include a thorough review of all your medications.    As you primary medical provider recommend that you make an appointment for your Annual Wellness Visit if you have not done so already this year.  You may set up this appointment before you leave today or you may call back ([phone]) and schedule an appointment.  Please make sure when you call that you mention that you are  scheduling your Annual Wellness Visit with the clinical pharmacist so that the appointment may be made for the proper length of time.     Continue current medications. Continue good therapeutic lifestyle changes which include good diet and exercise. Fall precautions discussed with patient. If an FOBT was given today- please return it to our front desk. If you are over 71 years old - you may need Prevnar 62 or the adult Pneumonia vaccine.  Flu Shots are still available at our office. If you still haven't had one please call to set up a nurse visit to get one.   After your visit with [place] today you will receive a survey in the mail or online from [employer] regarding your care with [place]. Please take a moment to fill this out. Your feedback is very important to [place] as you can help [place] better understand your patient needs as well as improve your experience and satisfaction. WE CARE ABOUT YOU!!!    reduce caffeine intake and try to drink more water  Be sure and rest after eating   Arrie Senate MD

## 2014-08-23 NOTE — Addendum Note (Signed)
Addended by: Selmer Dominion on: 08/23/2014 10:35 AM   Modules accepted: Orders

## 2014-08-23 NOTE — Patient Instructions (Addendum)
Medicare Annual Wellness Visit  [facility] and the medical providers at [facility] strive to bring you the best medical care.  In doing so we not only want to address your current medical conditions and concerns but also to detect new conditions early and prevent illness, disease and health-related problems.    Medicare offers a yearly Wellness Visit which allows our clinical staff to assess your need for preventative services including immunizations, lifestyle education, counseling to decrease risk of preventable diseases and screening for fall risk and other medical concerns.    This visit is provided free of charge (no copay) for all Medicare recipients. The clinical pharmacists at [facility] have begun to conduct these Wellness Visits which will also include a thorough review of all your medications.    As you primary medical provider recommend that you make an appointment for your Annual Wellness Visit if you have not done so already this year.  You may set up this appointment before you leave today or you may call back [phone]) and schedule an appointment.  Please make sure when you call that you mention that you are scheduling your Annual Wellness Visit with the clinical pharmacist so that the appointment may be made for the proper length of time.     Continue current medications. Continue good therapeutic lifestyle changes which include good diet and exercise. Fall precautions discussed with patient. If an FOBT was given today- please return it to our front desk. If you are over 29 years old - you may need Prevnar 47 or the adult Pneumonia vaccine.  Flu Shots are still available at our office. If you still haven't had one please call to set up a nurse visit to get one.   After your visit with [place] today you will receive a survey in the mail or online from [employer] regarding your care with [place]. Please take a moment to  fill this out. Your feedback is very important to [place] as you can help [place] better understand your patient needs as well as improve your experience and satisfaction. WE CARE ABOUT YOU!!!    reduce caffeine intake and try to drink more water  Be sure and rest after eating

## 2014-08-24 LAB — NMR, LIPOPROFILE
CHOLESTEROL: 123 mg/dL (ref 100–199)
HDL Cholesterol by NMR: 52 mg/dL (ref >39)
HDL Particle Number: 31.4 umol/L (ref >=30.5)
LDL Particle Number: 589 nmol/L (ref <1000)
LDL Size: 20.5 nm (ref >20.5)
LDL-C: 57 mg/dL (ref 0–99)
LP-IR SCORE: 27 (ref <=45)
Small LDL Particle Number: 250 nmol/L (ref <=527)
Triglycerides by NMR: 70 mg/dL (ref 0–149)

## 2014-08-24 LAB — BMP8+EGFR
BUN/Creatinine Ratio: 17 (ref 10–22)
BUN: 15 mg/dL (ref 8–27)
CALCIUM: 9 mg/dL (ref 8.6–10.2)
CO2: 24 mmol/L (ref 18–29)
Chloride: 104 mmol/L (ref 97–108)
Creatinine, Ser: 0.86 mg/dL (ref 0.76–1.27)
GFR calc Af Amer: 95 mL/min/{1.73_m2} (ref >59)
GFR calc non Af Amer: 82 mL/min/{1.73_m2} (ref >59)
Glucose: 109 mg/dL — ABNORMAL HIGH (ref 65–99)
POTASSIUM: 4.3 mmol/L (ref 3.5–5.2)
Sodium: 142 mmol/L (ref 134–144)

## 2014-08-24 LAB — HEPATIC FUNCTION PANEL
ALT: 18 IU/L (ref 0–44)
AST: 25 IU/L (ref 0–40)
Albumin: 3.9 g/dL (ref 3.5–4.8)
Alkaline Phosphatase: 113 IU/L (ref 39–117)
BILIRUBIN TOTAL: 0.5 mg/dL (ref 0.0–1.2)
Bilirubin, Direct: 0.15 mg/dL (ref 0.00–0.40)
Total Protein: 6.4 g/dL (ref 6.0–8.5)

## 2014-08-24 LAB — PSA, TOTAL AND FREE
PSA, Free Pct: 55 %
PSA, Free: 0.11 ng/mL
Prostate Specific Ag, Serum: 0.2 ng/mL (ref 0.0–4.0)

## 2014-08-24 LAB — VITAMIN D 25 HYDROXY (VIT D DEFICIENCY, FRACTURES): VIT D 25 HYDROXY: 40.7 ng/mL (ref 30.0–100.0)

## 2014-08-25 LAB — URINE CULTURE

## 2014-10-13 ENCOUNTER — Encounter: Payer: Self-pay | Admitting: *Deleted

## 2014-11-07 ENCOUNTER — Other Ambulatory Visit: Payer: Self-pay | Admitting: Family Medicine

## 2014-11-15 ENCOUNTER — Other Ambulatory Visit: Payer: Self-pay | Admitting: Family Medicine

## 2014-12-05 ENCOUNTER — Other Ambulatory Visit: Payer: Self-pay | Admitting: Family Medicine

## 2015-01-09 ENCOUNTER — Ambulatory Visit: Payer: Commercial Managed Care - HMO | Admitting: Family Medicine

## 2015-01-13 ENCOUNTER — Encounter: Payer: Self-pay | Admitting: Family Medicine

## 2015-01-13 ENCOUNTER — Ambulatory Visit (INDEPENDENT_AMBULATORY_CARE_PROVIDER_SITE_OTHER): Payer: Commercial Managed Care - HMO | Admitting: Family Medicine

## 2015-01-13 ENCOUNTER — Ambulatory Visit (INDEPENDENT_AMBULATORY_CARE_PROVIDER_SITE_OTHER): Payer: Commercial Managed Care - HMO

## 2015-01-13 VITALS — BP 123/77 | HR 74 | Temp 98.0°F | Ht 71.0 in | Wt 181.0 lb

## 2015-01-13 DIAGNOSIS — E785 Hyperlipidemia, unspecified: Secondary | ICD-10-CM | POA: Diagnosis not present

## 2015-01-13 DIAGNOSIS — N400 Enlarged prostate without lower urinary tract symptoms: Secondary | ICD-10-CM

## 2015-01-13 DIAGNOSIS — I700 Atherosclerosis of aorta: Secondary | ICD-10-CM

## 2015-01-13 DIAGNOSIS — I10 Essential (primary) hypertension: Secondary | ICD-10-CM

## 2015-01-13 DIAGNOSIS — I200 Unstable angina: Secondary | ICD-10-CM

## 2015-01-13 DIAGNOSIS — E1151 Type 2 diabetes mellitus with diabetic peripheral angiopathy without gangrene: Secondary | ICD-10-CM | POA: Diagnosis not present

## 2015-01-13 DIAGNOSIS — M4004 Postural kyphosis, thoracic region: Secondary | ICD-10-CM | POA: Diagnosis not present

## 2015-01-13 DIAGNOSIS — E559 Vitamin D deficiency, unspecified: Secondary | ICD-10-CM | POA: Diagnosis not present

## 2015-01-13 DIAGNOSIS — E119 Type 2 diabetes mellitus without complications: Secondary | ICD-10-CM | POA: Diagnosis not present

## 2015-01-13 DIAGNOSIS — Z23 Encounter for immunization: Secondary | ICD-10-CM

## 2015-01-13 DIAGNOSIS — E0843 Diabetes mellitus due to underlying condition with diabetic autonomic (poly)neuropathy: Secondary | ICD-10-CM | POA: Diagnosis not present

## 2015-01-13 DIAGNOSIS — D509 Iron deficiency anemia, unspecified: Secondary | ICD-10-CM | POA: Diagnosis not present

## 2015-01-13 LAB — POCT GLYCOSYLATED HEMOGLOBIN (HGB A1C): HEMOGLOBIN A1C: 5.9

## 2015-01-13 NOTE — Patient Instructions (Addendum)
Medicare Annual Wellness Visit  [facility] and the medical providers at [facility] strive to bring you the best medical care.  In doing so we not only want to address your current medical conditions and concerns but also to detect new conditions early and prevent illness, disease and health-related problems.    Medicare offers a yearly Wellness Visit which allows our clinical staff to assess your need for preventative services including immunizations, lifestyle education, counseling to decrease risk of preventable diseases and screening for fall risk and other medical concerns.    This visit is provided free of charge (no copay) for all Medicare recipients. The clinical pharmacists at [facility] have begun to conduct these Wellness Visits which will also include a thorough review of all your medications.    As you primary medical provider recommend that you make an appointment for your Annual Wellness Visit if you have not done so already this year.  You may set up this appointment before you leave today or you may call back [phone]) and schedule an appointment.  Please make sure when you call that you mention that you are scheduling your Annual Wellness Visit with the clinical pharmacist so that the appointment may be made for the proper length of time.     Continue current medications. Continue good therapeutic lifestyle changes which include good diet and exercise. Fall precautions discussed with patient. If an FOBT was given today- please return it to our front desk. If you are over 35 years old - you may need Prevnar 22 or the adult Pneumonia vaccine.  **Flu shots are available--- please call and schedule a FLU-CLINIC appointment**  After your visit with [place] today you will receive a survey in the mail or online from [employer] regarding your care with [place]. Please take a moment to fill this out. Your feedback is very  important to [place] as you can help [place] better understand your patient needs as well as improve your experience and satisfaction. WE CARE ABOUT YOU!!!   Always wear good shoes with good width Check that the regularly Follow-up with cardiology as recommended The flu shot that you received today may make your arm sore Watch sodium intake

## 2015-01-13 NOTE — Progress Notes (Signed)
Subjective:    Patient ID: [name], male    DOB: 1932-04-23, 81 y.o.   MRN: [id number]  HPI Pt here for follow up and management of chronic medical problems which includes hypertension, hyperlipidemia, and diabetes. He is taking medications regularly. The patient looks good and has no specific complaints today he is due to get a chest x-ray return an FOBT get his lab work and his flu shot today. The patient has no complaints and he denies chest pain shortness of breath trouble swallowing heartburn indigestion nausea vomiting diarrhea or blood in the stool. He is passing his water without problems. He stays active physically and is in good spirits. The patient does complain of burning in both feet. The patient denies any claudication symptoms. The patient says that his blood sugars at home and they're running in the 90 to the 100 range.      Patient Active Problem List   Diagnosis Date Noted  . Anemia, iron deficiency 04/11/2014  . Orthostatic hypotension 06/22/2012  . Dizzy 06/22/2012  . Precordial pain 09/18/2011  . Unstable angina (HCC) 07/24/2011  . CAD (coronary artery disease) 01/30/2011  . COLONIC POLYPS 06/23/2008  . DM 06/23/2008  . HYPERLIPIDEMIA-MIXED 06/23/2008  . Aortic valve disorder 06/23/2008  . BENIGN PROSTATIC HYPERTROPHY, HX OF 06/23/2008   Outpatient Encounter Prescriptions as of 01/13/2015  Medication Sig  . aspirin 81 MG tablet Take 81 mg by mouth daily.    Marland Kitchen atorvastatin (LIPITOR) 80 MG tablet TAKE ONE TABLET BY MOUTH ONE TIME DAILY  . FeFum-FePoly-FA-B Cmp-C-Biot (FOLIVANE-PLUS) CAPS TAKE ONE CAPSULE BY MOUTH ONE TIME DAILY  . FREESTYLE LITE test strip USE TO TEST SUGAR ONCE DAILY  . lisinopril (PRINIVIL,ZESTRIL) 10 MG tablet TAKE ONE TABLET BY MOUTH ONE TIME DAILY  . metFORMIN (GLUCOPHAGE) 500 MG tablet TAKE ONE TABLET BY MOUTH TWICE DAILY  . metoprolol succinate (TOPROL-XL) 25 MG 24 hr tablet Take 25 mg by mouth daily.    . nitroGLYCERIN (NITROSTAT)  0.4 MG SL tablet Place 1 tablet (0.4 mg total) under the tongue every 5 (five) minutes x 3 doses as needed for chest pain.   No facility-administered encounter medications on file as of 01/13/2015.      Review of Systems  Constitutional: Negative.   HENT: Negative.   Eyes: Negative.   Respiratory: Negative.   Cardiovascular: Negative.   Gastrointestinal: Negative.   Endocrine: Negative.   Genitourinary: Negative.   Musculoskeletal: Negative.   Skin: Negative.   Allergic/Immunologic: Negative.   Neurological: Negative.   Hematological: Negative.   Psychiatric/Behavioral: Negative.        Objective:   Physical Exam  Constitutional: He is oriented to person, place, and time. He appears well-developed and well-nourished. No distress.  HENT:  Head: Normocephalic and atraumatic.  Right Ear: External ear normal.  Left Ear: External ear normal.  Nose: Nose normal.  Mouth/Throat: Oropharynx is clear and moist. No oropharyngeal exudate.  Eyes: Conjunctivae and EOM are normal. Pupils are equal, round, and reactive to light. Right eye exhibits no discharge. Left eye exhibits no discharge. No scleral icterus.  Neck: Normal range of motion. Neck supple. No thyromegaly present.  There is a carotid bruit but most likely related to the systolic ejection murmur from his heart.  Cardiovascular: Normal rate and regular rhythm.  Exam reveals no friction rub.   Murmur heard. The pulses were diminished in the right foot. The heart is regular at 72/m with a grade 2/6 systolic ejection murmur that  is coarse in nature.  Pulmonary/Chest: Effort normal and breath sounds normal. No respiratory distress. He has no wheezes. He has no rales. He exhibits no tenderness.  Clear anteriorly and posteriorly and no axillary adenopathy  Abdominal: Soft. Bowel sounds are normal. He exhibits no mass. There is no tenderness. There is no rebound and no guarding.  Abdomen was soft without masses tenderness or organ  enlargement. There were good inguinal pulses.  Musculoskeletal: Normal range of motion. He exhibits no edema.  The patient does have thoracic kyphosis.  Lymphadenopathy:    He has no cervical adenopathy.  Neurological: He is alert and oriented to person, place, and time. He has normal reflexes. No cranial nerve deficit.  Lower extremity reflexes were somewhat diminished. There were equal bilaterally.  Skin: Skin is warm and dry. No rash noted. No erythema. No pallor.  Psychiatric: He has a normal mood and affect. His behavior is normal. Judgment and thought content normal.  Nursing note and vitals reviewed.  BP 123/77 mmHg  Pulse 74  Temp(Src) 98 F (36.7 C) (Oral)  Ht 5\' 11"  (1.803 m)  Wt 181 lb (82.101 kg)  BMI 25.26 kg/m2  [facility] reading (PRIMARY) by  Dr.Aki Burdin-chest x-ray- previous surgery and kyphosis is present with thoracic aortic atherosclerosis                                    Assessment & Plan:  1. Vitamin D deficiency -Continue current treatment pending results of lab work - CBC with Differential/Platelet - Vit D  25 hydroxy (rtn osteoporosis monitoring)  2. Anemia, iron deficiency -Continue current treatment pending results of lab work - CBC with Differential/Platelet  3. Essential hypertension -The blood pressure is good today he should continue with his lisinopril and metoprolol - BMP8+EGFR - CBC with Differential/Platelet - Hepatic function panel - NMR, lipoprofile - DG Chest 2 View; Future  4. Hyperlipidemia -The patient should continue with his atorvastatin pending the results of lab work - CBC with Differential/Platelet - NMR, lipoprofile  5. Type 2 diabetes mellitus without complication, without long-term current use of insulin (HCC) -Continue with metformin pending results of lab work. Check blood sugars regularly and bring these readings to the next visit. - POCT glycosylated hemoglobin (Hb A1C) - BMP8+EGFR - CBC with Differential/Platelet  6.  BPH (benign prostatic hyperplasia) -Patient is having no symptoms or complaints with his prostate gland or voiding issues. - CBC with Differential/Platelet  7. Intermediate coronary syndrome (HCC) -He has not had any chest pain or discomfort and we will make sure he keeps his regular appointment with the cardiologist.  8. Diabetic autonomic neuropathy associated with diabetes mellitus due to underlying condition [facility]) -The patient should check his feet regularly and wear good shoes.  9. Type II diabetes mellitus with peripheral circulatory disorder (HCC) -The patient should let us know if he develops any symptoms with walking or exercise requiring him to stop and rest  10. Thoracic aortic atherosclerosis per chest x-ray today  Patient Instructions                       Medicare Annual Wellness Visit  [facility] and the medical providers at [facility] strive to bring you the best medical care.  In doing so we not only want to address your current medical conditions and concerns but also to detect new conditions early and prevent illness,  disease and health-related problems.    Medicare offers a yearly Wellness Visit which allows our clinical staff to assess your need for preventative services including immunizations, lifestyle education, counseling to decrease risk of preventable diseases and screening for fall risk and other medical concerns.    This visit is provided free of charge (no copay) for all Medicare recipients. The clinical pharmacists at [facility] have begun to conduct these Wellness Visits which will also include a thorough review of all your medications.    As you primary medical provider recommend that you make an appointment for your Annual Wellness Visit if you have not done so already this year.  You may set up this appointment before you leave today or you may call back ([phone]) and schedule an appointment.  Please make  sure when you call that you mention that you are scheduling your Annual Wellness Visit with the clinical pharmacist so that the appointment may be made for the proper length of time.     Continue current medications. Continue good therapeutic lifestyle changes which include good diet and exercise. Fall precautions discussed with patient. If an FOBT was given today- please return it to our front desk. If you are over 3 years old - you may need Prevnar 13 or the adult Pneumonia vaccine.  **Flu shots are available--- please call and schedule a FLU-CLINIC appointment**  After your visit with [place] today you will receive a survey in the mail or online from [employer] regarding your care with [place]. Please take a moment to fill this out. Your feedback is very important to [place] as you can help [place] better understand your patient needs as well as improve your experience and satisfaction. WE CARE ABOUT YOU!!!   Always wear good shoes with good width Check that the regularly Follow-up with cardiology as recommended The flu shot that you received today may make your arm sore Watch sodium intake    Nyra Capes MD

## 2015-01-14 LAB — HEPATIC FUNCTION PANEL
ALBUMIN: 4.1 g/dL (ref 3.5–4.7)
ALT: 18 IU/L (ref 0–44)
AST: 30 IU/L (ref 0–40)
Alkaline Phosphatase: 127 IU/L — ABNORMAL HIGH (ref 39–117)
BILIRUBIN TOTAL: 0.8 mg/dL (ref 0.0–1.2)
BILIRUBIN, DIRECT: 0.23 mg/dL (ref 0.00–0.40)
Total Protein: 6.9 g/dL (ref 6.0–8.5)

## 2015-01-14 LAB — NMR, LIPOPROFILE
Cholesterol: 126 mg/dL (ref 100–199)
HDL CHOLESTEROL BY NMR: 55 mg/dL (ref >39)
HDL PARTICLE NUMBER: 33.5 umol/L (ref >=30.5)
LDL Particle Number: 629 nmol/L (ref <1000)
LDL SIZE: 21.4 nm (ref >20.5)
LDL-C: 59 mg/dL (ref 0–99)
LP-IR Score: <25 (ref <=45)
SMALL LDL PARTICLE NUMBER: 213 nmol/L (ref <=527)
Triglycerides by NMR: 62 mg/dL (ref 0–149)

## 2015-01-14 LAB — CBC WITH DIFFERENTIAL/PLATELET
BASOS: 1 %
Basophils Absolute: 0.1 10*3/uL (ref 0.0–0.2)
EOS (ABSOLUTE): 0.4 10*3/uL (ref 0.0–0.4)
EOS: 5 %
HEMATOCRIT: 40.4 % (ref 37.5–51.0)
HEMOGLOBIN: 13.5 g/dL (ref 12.6–17.7)
IMMATURE GRANS (ABS): 0 10*3/uL (ref 0.0–0.1)
IMMATURE GRANULOCYTES: 0 %
LYMPHS: 27 %
Lymphocytes Absolute: 2.1 10*3/uL (ref 0.7–3.1)
MCH: 30.7 pg (ref 26.6–33.0)
MCHC: 33.4 g/dL (ref 31.5–35.7)
MCV: 92 fL (ref 79–97)
MONOCYTES: 8 %
Monocytes Absolute: 0.6 10*3/uL (ref 0.1–0.9)
NEUTROS PCT: 59 %
Neutrophils Absolute: 4.8 10*3/uL (ref 1.4–7.0)
Platelets: 203 10*3/uL (ref 150–379)
RBC: 4.4 x10E6/uL (ref 4.14–5.80)
RDW: 13.2 % (ref 12.3–15.4)
WBC: 8 10*3/uL (ref 3.4–10.8)

## 2015-01-14 LAB — VITAMIN D 25 HYDROXY (VIT D DEFICIENCY, FRACTURES): VIT D 25 HYDROXY: 39.5 ng/mL (ref 30.0–100.0)

## 2015-01-14 LAB — BMP8+EGFR
BUN/Creatinine Ratio: 10 (ref 10–22)
BUN: 10 mg/dL (ref 8–27)
CO2: 25 mmol/L (ref 18–29)
Calcium: 9.6 mg/dL (ref 8.6–10.2)
Chloride: 104 mmol/L (ref 97–106)
Creatinine, Ser: 1 mg/dL (ref 0.76–1.27)
GFR, EST AFRICAN AMERICAN: 82 mL/min/{1.73_m2} (ref >59)
GFR, EST NON AFRICAN AMERICAN: 71 mL/min/{1.73_m2} (ref >59)
Glucose: 107 mg/dL — ABNORMAL HIGH (ref 65–99)
POTASSIUM: 4.3 mmol/L (ref 3.5–5.2)
SODIUM: 143 mmol/L (ref 136–144)

## 2015-01-17 ENCOUNTER — Other Ambulatory Visit: Payer: Commercial Managed Care - HMO

## 2015-01-17 DIAGNOSIS — Z1212 Encounter for screening for malignant neoplasm of rectum: Secondary | ICD-10-CM | POA: Diagnosis not present

## 2015-01-17 NOTE — Progress Notes (Signed)
Lab only 

## 2015-01-19 LAB — FECAL OCCULT BLOOD, IMMUNOCHEMICAL: FECAL OCCULT BLD: NEGATIVE

## 2015-01-25 ENCOUNTER — Other Ambulatory Visit: Payer: Self-pay | Admitting: *Deleted

## 2015-01-25 MED ORDER — METFORMIN HCL 500 MG PO TABS: 500.0000 mg | ORAL_TABLET | Freq: Two times a day (BID) | ORAL | Status: DC

## 2015-01-30 DIAGNOSIS — Z0289 Encounter for other administrative examinations: Secondary | ICD-10-CM

## 2015-02-23 ENCOUNTER — Other Ambulatory Visit: Payer: Self-pay | Admitting: Family Medicine

## 2015-03-08 ENCOUNTER — Ambulatory Visit (INDEPENDENT_AMBULATORY_CARE_PROVIDER_SITE_OTHER): Payer: Commercial Managed Care - HMO | Admitting: Family Medicine

## 2015-03-08 ENCOUNTER — Encounter: Payer: Self-pay | Admitting: Family Medicine

## 2015-03-08 VITALS — BP 120/68 | HR 89 | Temp 97.1°F | Ht 71.0 in | Wt 184.0 lb

## 2015-03-08 DIAGNOSIS — H918X9 Other specified hearing loss, unspecified ear: Secondary | ICD-10-CM | POA: Diagnosis not present

## 2015-03-08 DIAGNOSIS — H6120 Impacted cerumen, unspecified ear: Secondary | ICD-10-CM | POA: Diagnosis not present

## 2015-03-08 DIAGNOSIS — H9193 Unspecified hearing loss, bilateral: Secondary | ICD-10-CM | POA: Diagnosis not present

## 2015-03-08 DIAGNOSIS — H6123 Impacted cerumen, bilateral: Secondary | ICD-10-CM

## 2015-03-08 NOTE — Patient Instructions (Addendum)
Use Debrox eardrops 3-4 drops each ear for 3 nights in a row wait 1 week and repeat and then come to the office and let the nurse irrigate the  Ears to help remove the cerumen Return to clinic for ear irrigation prn

## 2015-03-08 NOTE — Progress Notes (Signed)
Subjective:    Patient ID: [name], male    DOB: Mar 25, 1933, 81 y.o.   MRN: [id number]  HPI Patient here today for ears feeling "stopped up". He has had a cough but this is better and he does not have any increased congestion. The right ear is worse than the left ear. He does not have any pain. He indicates took a shower one night and the ears got even more stopped up and the right was worse than the left.      Patient Active Problem List   Diagnosis Date Noted  . Anemia, iron deficiency 04/11/2014  . Orthostatic hypotension 06/22/2012  . Dizzy 06/22/2012  . Precordial pain 09/18/2011  . Unstable angina ([place]) 07/24/2011  . CAD (coronary artery disease) 01/30/2011  . COLONIC POLYPS 06/23/2008  . DM 06/23/2008  . HYPERLIPIDEMIA-MIXED 06/23/2008  . Aortic valve disorder 06/23/2008  . BENIGN PROSTATIC HYPERTROPHY, HX OF 06/23/2008   Outpatient Encounter Prescriptions as of 03/08/2015  Medication Sig  . aspirin 81 MG tablet Take 81 mg by mouth daily.    Marland Kitchen atorvastatin (LIPITOR) 80 MG tablet TAKE ONE TABLET BY MOUTH ONE TIME DAILY  . FeFum-FePoly-FA-B Cmp-C-Biot (FOLIVANE-PLUS) CAPS TAKE ONE CAPSULE BY MOUTH ONE TIME DAILY  . FREESTYLE LITE test strip USE TO TEST SUGAR ONCE DAILY  . lisinopril (PRINIVIL,ZESTRIL) 10 MG tablet TAKE ONE TABLET BY MOUTH ONE TIME DAILY  . metFORMIN (GLUCOPHAGE) 500 MG tablet Take 1 tablet (500 mg total) by mouth 2 (two) times daily.  . metoprolol succinate (TOPROL-XL) 25 MG 24 hr tablet Take 25 mg by mouth daily.    . nitroGLYCERIN (NITROSTAT) 0.4 MG SL tablet Place 1 tablet (0.4 mg total) under the tongue every 5 (five) minutes x 3 doses as needed for chest pain.   No facility-administered encounter medications on file as of 03/08/2015.      Review of Systems  Constitutional: Negative.   HENT: Positive for ear pain (feels full / stopped up).   Eyes: Negative.   Respiratory: Negative.   Cardiovascular: Negative.   Gastrointestinal: Negative.    Endocrine: Negative.   Genitourinary: Negative.   Musculoskeletal: Negative.   Skin: Negative.   Allergic/Immunologic: Negative.   Neurological: Negative.   Hematological: Negative.   Psychiatric/Behavioral: Negative.        Objective:   Physical Exam  Constitutional: He is oriented to person, place, and time. He appears well-developed and well-nourished.  HENT:  Head: Normocephalic and atraumatic.  The patient has bilateral ear cerumen. The ear canals and be irrigated to see if this regimen can be removed  Eyes: Conjunctivae and EOM are normal. Pupils are equal, round, and reactive to light. Right eye exhibits no discharge. Left eye exhibits no discharge. No scleral icterus.  Musculoskeletal: Normal range of motion.  Neurological: He is alert and oriented to person, place, and time.  Skin: No rash noted.  Psychiatric: He has a normal mood and affect. His behavior is normal. Judgment and thought content normal.  Nursing note and vitals reviewed.   BP 120/68 mmHg  Pulse 89  Temp(Src) 97.1 F (36.2 C) (Oral)  Ht 5\' 11"  (1.803 m)  Wt 184 lb (83.462 kg)  BMI 25.67 kg/m2        Assessment & Plan:  1. Decreased hearing, bilateral -secondary to the abundance of ear cerumen in each ear canal -The ears cerumen was successfully removed from the left ear canal but still impacted in the right with only a small amount  of cerumen being removed. -The patient will return to the clinic in 10-14 days after using the Debrox and we will attempt again to remove the cerumen from the right ear.  2. Hearing loss of both ears due to cerumen impaction  Patient Instructions   Use Debrox eardrops 3-4 drops each ear for 3 nights in a row wait 1 week and repeat and then come to the office and let the nurse irrigate the  Ears to help remove the cerumen Return to clinic for ear irrigation prn   Arrie Senate MD

## 2015-03-10 ENCOUNTER — Other Ambulatory Visit: Payer: Self-pay | Admitting: Nurse Practitioner

## 2015-04-19 ENCOUNTER — Other Ambulatory Visit: Payer: Self-pay | Admitting: Family Medicine

## 2015-05-24 ENCOUNTER — Ambulatory Visit (INDEPENDENT_AMBULATORY_CARE_PROVIDER_SITE_OTHER): Payer: Commercial Managed Care - HMO

## 2015-05-24 DIAGNOSIS — H6123 Impacted cerumen, bilateral: Secondary | ICD-10-CM

## 2015-05-24 NOTE — Progress Notes (Signed)
Patient ID: [name], male   DOB: 04-01-1932, 82 y.o.   MRN: [id number]   Patient presents to office today for a bilateral ear irrigation. Patient tolerated procedure well. Large amount of wax removed from both ears. Will return to office if any further problems

## 2015-06-05 ENCOUNTER — Encounter: Payer: Self-pay | Admitting: Family Medicine

## 2015-06-05 ENCOUNTER — Ambulatory Visit (INDEPENDENT_AMBULATORY_CARE_PROVIDER_SITE_OTHER): Payer: Commercial Managed Care - HMO | Admitting: Family Medicine

## 2015-06-05 VITALS — BP 127/64 | HR 67 | Temp 96.7°F | Ht 71.0 in | Wt 181.0 lb

## 2015-06-05 DIAGNOSIS — I25709 Atherosclerosis of coronary artery bypass graft(s), unspecified, with unspecified angina pectoris: Secondary | ICD-10-CM

## 2015-06-05 DIAGNOSIS — E1169 Type 2 diabetes mellitus with other specified complication: Secondary | ICD-10-CM

## 2015-06-05 DIAGNOSIS — E785 Hyperlipidemia, unspecified: Secondary | ICD-10-CM

## 2015-06-05 DIAGNOSIS — I10 Essential (primary) hypertension: Secondary | ICD-10-CM | POA: Diagnosis not present

## 2015-06-05 DIAGNOSIS — N400 Enlarged prostate without lower urinary tract symptoms: Secondary | ICD-10-CM

## 2015-06-05 DIAGNOSIS — D509 Iron deficiency anemia, unspecified: Secondary | ICD-10-CM | POA: Diagnosis not present

## 2015-06-05 DIAGNOSIS — R0989 Other specified symptoms and signs involving the circulatory and respiratory systems: Secondary | ICD-10-CM

## 2015-06-05 DIAGNOSIS — E559 Vitamin D deficiency, unspecified: Secondary | ICD-10-CM

## 2015-06-05 DIAGNOSIS — E119 Type 2 diabetes mellitus without complications: Secondary | ICD-10-CM

## 2015-06-05 DIAGNOSIS — I739 Peripheral vascular disease, unspecified: Secondary | ICD-10-CM | POA: Diagnosis not present

## 2015-06-05 LAB — BAYER DCA HB A1C WAIVED: HB A1C: 6.1 % (ref <7.0)

## 2015-06-05 NOTE — Patient Instructions (Addendum)
Medicare Annual Wellness Visit  [place] and the medical providers at [facility] strive to bring you the best medical care.  In doing so we not only want to address your current medical conditions and concerns but also to detect new conditions early and prevent illness, disease and health-related problems.    Medicare offers a yearly Wellness Visit which allows our clinical staff to assess your need for preventative services including immunizations, lifestyle education, counseling to decrease risk of preventable diseases and screening for fall risk and other medical concerns.    This visit is provided free of charge (no copay) for all Medicare recipients. The clinical pharmacists at [facility] have begun to conduct these Wellness Visits which will also include a thorough review of all your medications.    As you primary medical provider recommend that you make an appointment for your Annual Wellness Visit if you have not done so already this year.  You may set up this appointment before you leave today or you may call back [phone] and schedule an appointment.  Please make sure when you call that you mention that you are scheduling your Annual Wellness Visit with the clinical pharmacist so that the appointment may be made for the proper length of time.     Continue current medications. Continue good therapeutic lifestyle changes which include good diet and exercise. Fall precautions discussed with patient. If an FOBT was given today- please return it to our front desk. If you are over 77 years old - you may need Prevnar 18 or the adult Pneumonia vaccine.  **Flu shots are available--- please call and schedule a FLU-CLINIC appointment**  After your visit with [place] today you will receive a survey in the mail or online from [employer] regarding your care with [place]. Please take a moment to fill this out. Your feedback is very  important to [place] as you can help [place] better understand your patient needs as well as improve your experience and satisfaction. WE CARE ABOUT YOU!!!   Try to exercise and walk more Drink more water Avoid caffeine, ice cream, and Dr. Malachi Bonds--- drink more water Please follow-up on the information from the gastroenterologist and follow his recommendations We will check with the cardiologist about your next visit with him Check blood sugars at home and bring these to the next visit

## 2015-06-05 NOTE — Progress Notes (Signed)
Subjective:    Patient ID: [name], male    DOB: [birth year], 82 y.o.   MRN: [id number]  HPI Pt here for follow up and management of chronic medical problems which includes diabetes and hyperlipidemia. He is taking medications regularly. The patient indicates he has not been exercising as much and feels like he needs to do more of this and he feels better when he does more. He is due for his visit with the cardiologist and he also got a note from the gastrologist asking them to call him that he may need another colonoscopy. He will follow-up and follow through on these request. He denies any chest pain shortness of breath trouble swallowing heartburn indigestion nausea vomiting diarrhea or blood in the stool. He is not having any abdominal pain. His stools are dark because he takes iron. He is passing his water without problems. He says his blood sugars at home and been running mostly in the 90s. He does note that sometimes at nighttime his heart seems to race at times. The patient admits to drinking Dr. Samson Frederic, eating [employer], and eating ice cream.     Patient Active Problem List   Diagnosis Date Noted  . Anemia, iron deficiency 04/11/2014  . Orthostatic hypotension 06/22/2012  . Dizzy 06/22/2012  . Precordial pain 09/18/2011  . Unstable angina ([place]) 07/24/2011  . CAD (coronary artery disease) 01/30/2011  . COLONIC POLYPS 06/23/2008  . DM 06/23/2008  . Hyperlipidemia LDL goal <70 06/23/2008  . Aortic valve disorder 06/23/2008  . BENIGN PROSTATIC HYPERTROPHY, HX OF 06/23/2008   Outpatient Encounter Prescriptions as of 06/05/2015  Medication Sig  . aspirin 81 MG tablet Take 81 mg by mouth daily.    Marland Kitchen atorvastatin (LIPITOR) 80 MG tablet TAKE ONE TABLET BY MOUTH ONE TIME DAILY  . FeFum-FePoly-FA-B Cmp-C-Biot (FOLIVANE-PLUS) CAPS TAKE ONE CAPSULE BY MOUTH ONE TIME DAILY  . FREESTYLE LITE test strip USE TO TEST SUGAR ONCE DAILY  . lisinopril (PRINIVIL,ZESTRIL) 10 MG tablet TAKE ONE  TABLET BY MOUTH ONE TIME DAILY  . metFORMIN (GLUCOPHAGE) 500 MG tablet Take 1 tablet (500 mg total) by mouth 2 (two) times daily.  . metoprolol succinate (TOPROL-XL) 25 MG 24 hr tablet Take 25 mg by mouth daily.    . nitroGLYCERIN (NITROSTAT) 0.4 MG SL tablet Place 1 tablet (0.4 mg total) under the tongue every 5 (five) minutes x 3 doses as needed for chest pain.   No facility-administered encounter medications on file as of 06/05/2015.      Review of Systems  Constitutional: Negative.   HENT: Negative.   Eyes: Negative.   Respiratory: Negative.   Cardiovascular: Negative.   Gastrointestinal: Negative.   Endocrine: Negative.   Genitourinary: Negative.   Musculoskeletal: Negative.   Skin: Negative.   Allergic/Immunologic: Negative.   Neurological: Negative.   Hematological: Negative.   Psychiatric/Behavioral: Negative.        Objective:   Physical Exam  Constitutional: He is oriented to person, place, and time. He appears well-developed and well-nourished. No distress.  HENT:  Head: Normocephalic and atraumatic.  Right Ear: External ear normal.  Left Ear: External ear normal.  Mouth/Throat: Oropharynx is clear and moist. No oropharyngeal exudate.  Slight nasal congestion  Eyes: Conjunctivae and EOM are normal. Pupils are equal, round, and reactive to light. Right eye exhibits no discharge. Left eye exhibits no discharge. No scleral icterus.  Patient has eye exam planned in 1 month  Neck: Normal range of motion. Neck supple. No  thyromegaly present.  Bilateral carotid bruits most likely from heart left greater than right  Cardiovascular: Normal rate and regular rhythm.  Exam reveals no gallop and no friction rub.   Murmur heard. The right pedal pulses were diminished compared to the left The heart has a regular rate and rhythm at 60/m with a grade 3/6 systolic ejection murmur  Pulmonary/Chest: Effort normal and breath sounds normal. No respiratory distress. He has no wheezes.  He has no rales. He exhibits no tenderness.  Clear anteriorly and posteriorly without axillary adenopathy  Abdominal: Soft. Bowel sounds are normal. He exhibits no mass. There is no tenderness. There is no rebound and no guarding.  No liver or spleen enlargement no inguinal adenopathy and good inguinal pulses bilaterally  Musculoskeletal: Normal range of motion. He exhibits no edema.  The patient has had bilateral knee replacements and is doing well with these.  Lymphadenopathy:    He has no cervical adenopathy.  Neurological: He is alert and oriented to person, place, and time. He has normal reflexes. No cranial nerve deficit.  Skin: Skin is warm and dry. No rash noted.  Psychiatric: He has a normal mood and affect. His behavior is normal. Judgment and thought content normal.  Nursing note and vitals reviewed.  BP 127/64 mmHg  Pulse 67  Temp(Src) 96.7 F (35.9 C) (Oral)  Ht '5\' 11"'$  (1.803 m)  Wt 181 lb (82.101 kg)  BMI 25.26 kg/m2        Assessment & Plan:  1. Hyperlipidemia -Continue atorvastatin and improved exercise habits and diminish carbs and ice cream. - CBC with Differential/Platelet - NMR, lipoprofile  2. Essential hypertension -Blood pressure is good today he should continue with current treatment - BMP8+EGFR - CBC with Differential/Platelet - Hepatic function panel  3. Type 2 diabetes mellitus without complication, without long-term current use of insulin (HCC) -The blood pressure has been doing well at home and the patient and any changes in treatment will be secondary to pending lab work - CBC with Differential/Platelet - Bayer DCA Hb A1c Waived - Microalbumin / creatinine urine ratio  4. BPH (benign prostatic hyperplasia) -No complaints with this today - CBC with Differential/Platelet  5. Anemia, iron deficiency -No sign of any blood loss in the stool. The stools are dark secondary to iron intake. - CBC with Differential/Platelet  6. Vitamin D  deficiency -Continue current treatment pending results of lab work - CBC with Differential/Platelet - VITAMIN D 25 Hydroxy (Vit-D Deficiency, Fractures)  7. Hyperlipidemia LDL goal <70 -Continue current treatment pending results of lab work  8. Type 2 diabetes mellitus with hyperlipidemia ([place]) -Continue current treatment improved on therapeutic lifestyle changes leading diet and exercise  9. Coronary artery disease involving coronary bypass graft with unspecified angina pectoris -Follow-up with cardiology as planned  10. Bilateral carotid bruits -Check with cardiologist about need for Dopplers  No orders of the defined types were placed in this encounter.   Patient Instructions                       Medicare Annual Wellness Visit  [facility] and the medical providers at [facility] strive to bring you the best medical care.  In doing so we not only want to address your current medical conditions and concerns but also to detect new conditions early and prevent illness, disease and health-related problems.    Medicare offers a yearly Wellness Visit which allows our clinical staff to assess your  need for preventative services including immunizations, lifestyle education, counseling to decrease risk of preventable diseases and screening for fall risk and other medical concerns.    This visit is provided free of charge (no copay) for all Medicare recipients. The clinical pharmacists at [facility] have begun to conduct these Wellness Visits which will also include a thorough review of all your medications.    As you primary medical provider recommend that you make an appointment for your Annual Wellness Visit if you have not done so already this year.  You may set up this appointment before you leave today or you may call back [phone]) and schedule an appointment.  Please make sure when you call that you mention that you are scheduling your  Annual Wellness Visit with the clinical pharmacist so that the appointment may be made for the proper length of time.     Continue current medications. Continue good therapeutic lifestyle changes which include good diet and exercise. Fall precautions discussed with patient. If an FOBT was given today- please return it to our front desk. If you are over 4 years old - you may need Prevnar 75 or the adult Pneumonia vaccine.  **Flu shots are available--- please call and schedule a FLU-CLINIC appointment**  After your visit with [place] today you will receive a survey in the mail or online from [employer] regarding your care with [place]. Please take a moment to fill this out. Your feedback is very important to [place] as you can help [place] better understand your patient needs as well as improve your experience and satisfaction. WE CARE ABOUT YOU!!!   Try to exercise and walk more Drink more water Avoid caffeine, ice cream, and Dr. Malachi Bonds--- drink more water Please follow-up on the information from the gastroenterologist and follow his recommendations We will check with the cardiologist about your next visit with him Check blood sugars at home and bring these to the next visit    Arrie Senate MD

## 2015-06-06 ENCOUNTER — Telehealth: Payer: Self-pay | Admitting: *Deleted

## 2015-06-06 LAB — CBC WITH DIFFERENTIAL/PLATELET
BASOS ABS: 0 10*3/uL (ref 0.0–0.2)
BASOS: 1 %
EOS (ABSOLUTE): 0.5 10*3/uL — AB (ref 0.0–0.4)
Eos: 7 %
HEMOGLOBIN: 12.6 g/dL (ref 12.6–17.7)
Hematocrit: 38.1 % (ref 37.5–51.0)
IMMATURE GRANS (ABS): 0 10*3/uL (ref 0.0–0.1)
IMMATURE GRANULOCYTES: 0 %
LYMPHS: 27 %
Lymphocytes Absolute: 1.9 10*3/uL (ref 0.7–3.1)
MCH: 30.3 pg (ref 26.6–33.0)
MCHC: 33.1 g/dL (ref 31.5–35.7)
MCV: 92 fL (ref 79–97)
MONOCYTES: 6 %
Monocytes Absolute: 0.4 10*3/uL (ref 0.1–0.9)
NEUTROS ABS: 4.2 10*3/uL (ref 1.4–7.0)
NEUTROS PCT: 59 %
Platelets: 216 10*3/uL (ref 150–379)
RBC: 4.16 x10E6/uL (ref 4.14–5.80)
RDW: 13.6 % (ref 12.3–15.4)
WBC: 7.1 10*3/uL (ref 3.4–10.8)

## 2015-06-06 LAB — HEPATIC FUNCTION PANEL
ALK PHOS: 121 IU/L — AB (ref 39–117)
ALT: 13 IU/L (ref 0–44)
AST: 22 IU/L (ref 0–40)
Albumin: 3.9 g/dL (ref 3.5–4.7)
Bilirubin Total: 0.5 mg/dL (ref 0.0–1.2)
Bilirubin, Direct: 0.17 mg/dL (ref 0.00–0.40)
Total Protein: 6.4 g/dL (ref 6.0–8.5)

## 2015-06-06 LAB — NMR, LIPOPROFILE
Cholesterol: 123 mg/dL (ref 100–199)
HDL CHOLESTEROL BY NMR: 53 mg/dL (ref >39)
HDL PARTICLE NUMBER: 33.5 umol/L (ref >=30.5)
LDL PARTICLE NUMBER: 448 nmol/L (ref <1000)
LDL Size: 21.1 nm (ref >20.5)
LDL-C: 55 mg/dL (ref 0–99)
LP-IR SCORE: 33 (ref <=45)
Small LDL Particle Number: 219 nmol/L (ref <=527)
Triglycerides by NMR: 73 mg/dL (ref 0–149)

## 2015-06-06 LAB — BMP8+EGFR
BUN/Creatinine Ratio: 12 (ref 10–22)
BUN: 11 mg/dL (ref 8–27)
CALCIUM: 9.1 mg/dL (ref 8.6–10.2)
CO2: 22 mmol/L (ref 18–29)
Chloride: 102 mmol/L (ref 96–106)
Creatinine, Ser: 0.93 mg/dL (ref 0.76–1.27)
GFR, EST AFRICAN AMERICAN: 89 mL/min/{1.73_m2} (ref >59)
GFR, EST NON AFRICAN AMERICAN: 77 mL/min/{1.73_m2} (ref >59)
Glucose: 105 mg/dL — ABNORMAL HIGH (ref 65–99)
Potassium: 3.8 mmol/L (ref 3.5–5.2)
Sodium: 140 mmol/L (ref 134–144)

## 2015-06-06 LAB — MICROALBUMIN / CREATININE URINE RATIO
Creatinine, Urine: 66.2 mg/dL
Microalbumin, Urine: <3 ug/mL

## 2015-06-06 LAB — VITAMIN D 25 HYDROXY (VIT D DEFICIENCY, FRACTURES): VIT D 25 HYDROXY: 43.2 ng/mL (ref 30.0–100.0)

## 2015-06-06 NOTE — Telephone Encounter (Signed)
He has a dentist appt on 06/12/15.  He is having a tooth filled and he request an antibiotic be called in to take prior. He uses [facility]

## 2015-06-06 NOTE — Telephone Encounter (Signed)
Please check with clinical pharmacy. This patient has had aortic valve replacement surgery in the past. It should be a standing order for the future.

## 2015-06-07 MED ORDER — AMOXICILLIN 500 MG PO CAPS: ORAL_CAPSULE | ORAL | Status: AC

## 2015-06-07 NOTE — Telephone Encounter (Signed)
rx sent to pharmacy for amoxicillin 2000mg  take 1 hour prior to procedure

## 2015-06-07 NOTE — Telephone Encounter (Signed)
Pt aware of Tammy's order

## 2015-06-19 DIAGNOSIS — H524 Presbyopia: Secondary | ICD-10-CM | POA: Diagnosis not present

## 2015-06-19 DIAGNOSIS — H5210 Myopia, unspecified eye: Secondary | ICD-10-CM | POA: Diagnosis not present

## 2015-07-26 ENCOUNTER — Encounter: Payer: Self-pay | Admitting: Cardiology

## 2015-07-26 ENCOUNTER — Ambulatory Visit (INDEPENDENT_AMBULATORY_CARE_PROVIDER_SITE_OTHER): Payer: Commercial Managed Care - HMO | Admitting: Cardiology

## 2015-07-26 VITALS — BP 124/70 | HR 68 | Ht 71.0 in | Wt 175.0 lb

## 2015-07-26 DIAGNOSIS — Z954 Presence of other heart-valve replacement: Secondary | ICD-10-CM | POA: Diagnosis not present

## 2015-07-26 DIAGNOSIS — Z952 Presence of prosthetic heart valve: Secondary | ICD-10-CM

## 2015-07-26 NOTE — Progress Notes (Signed)
HPI The patient presents for followup of his aortic stenosis s/p aortic valve replacement.  Since I last saw him he has done well. He does have some episodes of dizziness when he first gets up in the morning.  This seems to be positional.  He did have a fall but he reports that he lost his footing with this.   He denies any chest pressure, neck or arm discomfort. He's had no palpitations, presyncope or syncope. He denies any shortness of breath, PND or orthopnea.   No Known Allergies  Current Outpatient Prescriptions  Medication Sig Dispense Refill  . aspirin 81 MG tablet Take 81 mg by mouth daily.      Marland Kitchen atorvastatin (LIPITOR) 80 MG tablet TAKE ONE TABLET BY MOUTH ONE TIME DAILY 30 tablet 4  . FeFum-FePoly-FA-B Cmp-C-Biot (FOLIVANE-PLUS) CAPS TAKE ONE CAPSULE BY MOUTH ONE TIME DAILY 30 capsule 2  . lisinopril (PRINIVIL,ZESTRIL) 10 MG tablet TAKE ONE TABLET BY MOUTH ONE TIME DAILY 30 tablet 4  . metFORMIN (GLUCOPHAGE) 500 MG tablet Take 1 tablet (500 mg total) by mouth 2 (two) times daily. 180 tablet 1  . metoprolol succinate (TOPROL-XL) 25 MG 24 hr tablet Take 25 mg by mouth daily.      . nitroGLYCERIN (NITROSTAT) 0.4 MG SL tablet Place 1 tablet (0.4 mg total) under the tongue every 5 (five) minutes x 3 doses as needed for chest pain. 25 tablet 4  . FREESTYLE LITE test strip USE TO TEST SUGAR ONCE DAILY 100 each 0   No current facility-administered medications for this visit.    Past Medical History  Diagnosis Date  . Diabetes mellitus   . Aortic stenosis     s/p pericardial AVR  . Hyperlipidemia   . Benign prostatic hypertrophy   . Coronary artery disease     s/p CABG 08/2010    . Arthritis   . Anemia     Iron deficiency    Past Surgical History  Procedure Laterality Date  . Knee replacements      x 2  . Hernia repair    . Aortic valve replacement  08/24/10    1mm pericardial tissue valve  . Coronary artery bypass graft  08/24/10    LIMA to LAD, SVG to ramus intermediate,  SVG to diagonal    ROS:  As stated in the HPI and negative for all other systems.  PHYSICAL EXAM BP 124/70 mmHg  Pulse 68  Ht 5\' 11"  (1.803 m)  Wt 175 lb (79.379 kg)  BMI 24.42 kg/m2 GENERAL:  Well appearing HEENT:  Pupils equal round and reactive, fundi not visualized, oral mucosa unremarkable NECK:  No jugular venous distention, waveform within normal limits, carotid upstroke brisk and symmetric, no bruits, no thyromegaly LYMPHATICS:  No cervical, inguinal adenopathy LUNGS:  Clear to auscultation bilaterally BACK:  No CVA tenderness CHEST:  Well healed sternotomy scar, prominent sternal wires HEART:  PMI not displaced or sustained,S1 and S2 within normal limits, no S3, no S4, no clicks, no rubs, apical systolic murmur radiating to the axilla and slightly out the aortic outflow tract and into the carotid mid peaking ABD:  Flat, positive bowel sounds normal in frequency in pitch, no bruits, no rebound, no guarding, no midline pulsatile mass, no hepatomegaly, no splenomegaly EXT:  2+ pulses, no edema.  EKG:  Sinus rhythm, rate 61, left axis deviation, no acute ST T wave changes. No significant change from previous EKG.   07/26/2015  ASSESSMENT AND PLAN  CAD:  He has had no new symptoms since his last stress test. He will continue with risk reduction.  DIZZINESS:  I suspect that this is related to his BP dropping.  We discussed strategies to avoid this.    AS s/p AVR:   He needs a follow up echocardiogram.    HTN:  No change in therapy is indicated.  DYSLIPIDEMIA:  His lipid profile was or excellent.  He will will continue meds as listed. To or  Lab Results  Component Value Date   CHOL 123 06/05/2015   TRIG 73 06/05/2015   HDL 53 06/05/2015   LDLCALC 69 11/25/2013   DM:  He has an excellent A1C as below  Lab Results  Component Value Date   HGBA1C 5.9 01/13/2015

## 2015-07-26 NOTE — Patient Instructions (Signed)
Medication Instructions:  The current medical regimen is effective;  continue present plan and medications.  Testing/Procedures: Your physician has requested that you have an echocardiogram. Echocardiography is a painless test that uses sound waves to create images of your heart. It provides your doctor with information about the size and shape of your heart and how well your heart's chambers and valves are working. This procedure takes approximately one hour. There are no restrictions for this procedure.  Follow-Up: Follow up in 1 year with Dr. Hochrein.  You will receive a letter in the mail 2 months before you are due.  Please call us when you receive this letter to schedule your follow up appointment.  If you need a refill on your cardiac medications before your next appointment, please call your pharmacy.  Thank you for choosing [facility]!!     

## 2015-07-27 DIAGNOSIS — H40013 Open angle with borderline findings, low risk, bilateral: Secondary | ICD-10-CM | POA: Diagnosis not present

## 2015-07-27 DIAGNOSIS — H2511 Age-related nuclear cataract, right eye: Secondary | ICD-10-CM | POA: Diagnosis not present

## 2015-07-27 DIAGNOSIS — H35031 Hypertensive retinopathy, right eye: Secondary | ICD-10-CM | POA: Diagnosis not present

## 2015-07-27 DIAGNOSIS — H25011 Cortical age-related cataract, right eye: Secondary | ICD-10-CM | POA: Diagnosis not present

## 2015-07-27 DIAGNOSIS — H2512 Age-related nuclear cataract, left eye: Secondary | ICD-10-CM | POA: Diagnosis not present

## 2015-07-27 DIAGNOSIS — H35363 Drusen (degenerative) of macula, bilateral: Secondary | ICD-10-CM | POA: Diagnosis not present

## 2015-07-27 DIAGNOSIS — H25012 Cortical age-related cataract, left eye: Secondary | ICD-10-CM | POA: Diagnosis not present

## 2015-07-27 DIAGNOSIS — H35032 Hypertensive retinopathy, left eye: Secondary | ICD-10-CM | POA: Diagnosis not present

## 2015-07-27 LAB — HM DIABETES EYE EXAM

## 2015-07-31 ENCOUNTER — Encounter: Payer: Self-pay | Admitting: *Deleted

## 2015-08-08 ENCOUNTER — Other Ambulatory Visit: Payer: Self-pay | Admitting: Cardiology

## 2015-08-08 DIAGNOSIS — Z952 Presence of prosthetic heart valve: Secondary | ICD-10-CM

## 2015-08-08 DIAGNOSIS — H2512 Age-related nuclear cataract, left eye: Secondary | ICD-10-CM | POA: Diagnosis not present

## 2015-08-09 ENCOUNTER — Other Ambulatory Visit: Payer: Self-pay

## 2015-08-09 ENCOUNTER — Ambulatory Visit (HOSPITAL_COMMUNITY): Payer: Commercial Managed Care - HMO | Attending: Cardiology

## 2015-08-09 DIAGNOSIS — E785 Hyperlipidemia, unspecified: Secondary | ICD-10-CM | POA: Diagnosis not present

## 2015-08-09 DIAGNOSIS — Z952 Presence of prosthetic heart valve: Secondary | ICD-10-CM

## 2015-08-09 DIAGNOSIS — I2510 Atherosclerotic heart disease of native coronary artery without angina pectoris: Secondary | ICD-10-CM | POA: Diagnosis not present

## 2015-08-09 DIAGNOSIS — E119 Type 2 diabetes mellitus without complications: Secondary | ICD-10-CM | POA: Insufficient documentation

## 2015-08-09 DIAGNOSIS — I10 Essential (primary) hypertension: Secondary | ICD-10-CM | POA: Insufficient documentation

## 2015-08-09 DIAGNOSIS — Z954 Presence of other heart-valve replacement: Secondary | ICD-10-CM | POA: Insufficient documentation

## 2015-08-30 DIAGNOSIS — H2511 Age-related nuclear cataract, right eye: Secondary | ICD-10-CM | POA: Diagnosis not present

## 2015-08-30 DIAGNOSIS — H25011 Cortical age-related cataract, right eye: Secondary | ICD-10-CM | POA: Diagnosis not present

## 2015-08-30 LAB — HM DIABETES EYE EXAM

## 2015-09-05 ENCOUNTER — Encounter: Payer: Self-pay | Admitting: *Deleted

## 2015-09-05 DIAGNOSIS — H2511 Age-related nuclear cataract, right eye: Secondary | ICD-10-CM | POA: Diagnosis not present

## 2015-09-15 ENCOUNTER — Other Ambulatory Visit: Payer: Self-pay | Admitting: *Deleted

## 2015-09-15 ENCOUNTER — Other Ambulatory Visit: Payer: Self-pay | Admitting: Family Medicine

## 2015-09-15 ENCOUNTER — Telehealth: Payer: Self-pay | Admitting: Family Medicine

## 2015-09-15 MED ORDER — ACCU-CHEK SOFTCLIX LANCETS MISC: Status: AC

## 2015-09-15 MED ORDER — FREESTYLE LANCETS MISC: Status: DC

## 2015-09-15 MED ORDER — ACCU-CHEK AVIVA DEVI: Status: AC

## 2015-09-15 MED ORDER — GLUCOSE BLOOD VI STRP: ORAL_STRIP | Status: DC

## 2015-09-15 NOTE — Telephone Encounter (Signed)
New Rx sent to pharmacy

## 2015-09-15 NOTE — Telephone Encounter (Signed)
done

## 2015-09-20 ENCOUNTER — Other Ambulatory Visit: Payer: Self-pay | Admitting: *Deleted

## 2015-09-20 MED ORDER — FOLIVANE-PLUS PO CAPS: 1.0000 | ORAL_CAPSULE | Freq: Every day | ORAL | Status: DC

## 2015-09-20 MED ORDER — ATORVASTATIN CALCIUM 80 MG PO TABS: 80.0000 mg | ORAL_TABLET | Freq: Every day | ORAL | Status: DC

## 2015-09-28 DIAGNOSIS — R69 Illness, unspecified: Secondary | ICD-10-CM | POA: Diagnosis not present

## 2015-09-28 DIAGNOSIS — Z961 Presence of intraocular lens: Secondary | ICD-10-CM | POA: Diagnosis not present

## 2015-10-17 ENCOUNTER — Ambulatory Visit: Payer: Commercial Managed Care - HMO | Admitting: Family Medicine

## 2015-10-18 ENCOUNTER — Encounter: Payer: Self-pay | Admitting: Family Medicine

## 2015-10-20 ENCOUNTER — Ambulatory Visit: Payer: Commercial Managed Care - HMO | Admitting: Family Medicine

## 2015-10-20 ENCOUNTER — Telehealth: Payer: Self-pay | Admitting: Family Medicine

## 2015-10-20 ENCOUNTER — Ambulatory Visit (INDEPENDENT_AMBULATORY_CARE_PROVIDER_SITE_OTHER): Payer: Commercial Managed Care - HMO | Admitting: Nurse Practitioner

## 2015-10-20 ENCOUNTER — Encounter: Payer: Self-pay | Admitting: Nurse Practitioner

## 2015-10-20 VITALS — BP 128/66 | HR 71 | Temp 97.0°F | Ht 71.0 in | Wt 178.0 lb

## 2015-10-20 DIAGNOSIS — F32A Depression, unspecified: Secondary | ICD-10-CM

## 2015-10-20 DIAGNOSIS — F329 Major depressive disorder, single episode, unspecified: Secondary | ICD-10-CM | POA: Diagnosis not present

## 2015-10-20 DIAGNOSIS — G4700 Insomnia, unspecified: Secondary | ICD-10-CM

## 2015-10-20 MED ORDER — ESCITALOPRAM OXALATE 10 MG PO TABS: 10.0000 mg | ORAL_TABLET | Freq: Every day | ORAL | 5 refills | Status: DC

## 2015-10-20 NOTE — Progress Notes (Signed)
   Subjective:    Patient ID: [name], male    DOB: 1933-03-23, 82 y.o.   MRN: [id number]  HPI Patient comes in today C/o insomnia. His wife passed away 6 weeks ago and since then he cannot sleep. Trouble staying asleep but is able to fall asleep. He has not tried any medication.  Depression screen [facility] 2/9 10/20/2015 06/05/2015 03/08/2015  Decreased Interest 3 0 0  Down, Depressed, Hopeless 3 0 0  PHQ - 2 Score 6 0 0  Altered sleeping 3 - -  Tired, decreased energy 0 - -  Change in appetite 2 - -  Feeling bad or failure about yourself  2 - -  Trouble concentrating 2 - -  Moving slowly or fidgety/restless 1 - -  Suicidal thoughts 0 - -  PHQ-9 Score 16 - -       Review of Systems  Constitutional: Negative.   HENT: Negative.   Respiratory: Negative.   Cardiovascular: Negative.   Genitourinary: Negative.   Neurological: Negative.   Psychiatric/Behavioral: Negative.   All other systems reviewed and are negative.      Objective:   Physical Exam  Constitutional: He is oriented to person, place, and time. He appears well-developed and well-nourished. No distress.  Cardiovascular: Normal rate.   Murmur (3/6 systolic murmur) heard. Pulmonary/Chest: Effort normal and breath sounds normal.  Neurological: He is alert and oriented to person, place, and time.  Skin: Skin is warm.  Psychiatric: He has a normal mood and affect. His behavior is normal. Judgment and thought content normal.    BP 128/66   Pulse 71   Temp 97 F (36.1 C) (Oral)   Ht 5\' 11"  (1.803 m)   Wt 178 lb (80.7 kg)   BMI 24.83 kg/m        Assessment & Plan:  1. Depression Stress management discussed Encouraged grief counseling - escitalopram (LEXAPRO) 10 MG tablet; Take 1 tablet (10 mg total) by mouth daily.  Dispense: 30 tablet; Refill: 5  2. Insomnia Bedtime routine melatoinin OTC RTO prn  Mary-Margaret Hassell Done, FNP

## 2015-10-20 NOTE — Patient Instructions (Signed)
Insomnia Insomnia is a sleep disorder that makes it difficult to fall asleep or to stay asleep. Insomnia can cause tiredness (fatigue), low energy, difficulty concentrating, mood swings, and poor performance at work or school.  There are three different ways to classify insomnia:  Difficulty falling asleep.  Difficulty staying asleep.  Waking up too early in the morning. Any type of insomnia can be long-term (chronic) or short-term (acute). Both are common. Short-term insomnia usually lasts for three months or less. Chronic insomnia occurs at least three times a week for longer than three months. CAUSES  Insomnia may be caused by another condition, situation, or substance, such as:  Anxiety.  Certain medicines.  Gastroesophageal reflux disease (GERD) or other gastrointestinal conditions.  Asthma or other breathing conditions.  Restless legs syndrome, sleep apnea, or other sleep disorders.  Chronic pain.  Menopause. This may include hot flashes.  Stroke.  Abuse of alcohol, tobacco, or illegal drugs.  Depression.  Caffeine.   Neurological disorders, such as Alzheimer disease.  An overactive thyroid (hyperthyroidism). The cause of insomnia may not be known. RISK FACTORS Risk factors for insomnia include:  Gender. Women are more commonly affected than men.  Age. Insomnia is more common as you get older.  Stress. This may involve your professional or personal life.  Income. Insomnia is more common in people with lower income.  Lack of exercise.   Irregular work schedule or night shifts.  Traveling between different time zones. SIGNS AND SYMPTOMS If you have insomnia, trouble falling asleep or trouble staying asleep is the main symptom. This may lead to other symptoms, such as:  Feeling fatigued.  Feeling nervous about going to sleep.  Not feeling rested in the morning.  Having trouble concentrating.  Feeling irritable, anxious, or depressed. TREATMENT   Treatment for insomnia depends on the cause. If your insomnia is caused by an underlying condition, treatment will focus on addressing the condition. Treatment may also include:   Medicines to help you sleep.  Counseling or therapy.  Lifestyle adjustments. HOME CARE INSTRUCTIONS   Take medicines only as directed by your health care provider.  Keep regular sleeping and waking hours. Avoid naps.  Keep a sleep diary to help you and your health care provider figure out what could be causing your insomnia. Include:   When you sleep.  When you wake up during the night.  How well you sleep.   How rested you feel the next day.  Any side effects of medicines you are taking.  What you eat and drink.   Make your bedroom a comfortable place where it is easy to fall asleep:  Put up shades or special blackout curtains to block light from outside.  Use a white noise machine to block noise.  Keep the temperature cool.   Exercise regularly as directed by your health care provider. Avoid exercising right before bedtime.  Use relaxation techniques to manage stress. Ask your health care provider to suggest some techniques that may work well for you. These may include:  Breathing exercises.  Routines to release muscle tension.  Visualizing peaceful scenes.  Cut back on alcohol, caffeinated beverages, and cigarettes, especially close to bedtime. These can disrupt your sleep.  Do not overeat or eat spicy foods right before bedtime. This can lead to digestive discomfort that can make it hard for you to sleep.  Limit screen use before bedtime. This includes:  Watching TV.  Using your smartphone, tablet, and computer.  Stick to a routine. This   can help you fall asleep faster. Try to do a quiet activity, brush your teeth, and go to bed at the same time each night.  Get out of bed if you are still awake after 15 minutes of trying to sleep. Keep the lights down, but try reading or  doing a quiet activity. When you feel sleepy, go back to bed.  Make sure that you drive carefully. Avoid driving if you feel very sleepy.  Keep all follow-up appointments as directed by your health care provider. This is important. SEEK MEDICAL CARE IF:   You are tired throughout the day or have trouble in your daily routine due to sleepiness.  You continue to have sleep problems or your sleep problems get worse. SEEK IMMEDIATE MEDICAL CARE IF:   You have serious thoughts about hurting yourself or someone else.   This information is not intended to replace advice given to you by your health care provider. Make sure you discuss any questions you have with your health care provider.   Document Released: 03/08/2000 Document Revised: 11/30/2014 Document Reviewed: 12/10/2013 Elsevier Interactive Patient Education 2016 [employer] Inc.  

## 2015-10-23 NOTE — Telephone Encounter (Signed)
Has appt with MMM today - can follow up with MMM or [facility] after that visit

## 2015-11-23 ENCOUNTER — Other Ambulatory Visit: Payer: Self-pay | Admitting: *Deleted

## 2015-11-23 MED ORDER — GLUCOSE BLOOD VI STRP: ORAL_STRIP | 11 refills | Status: DC

## 2015-12-26 ENCOUNTER — Other Ambulatory Visit: Payer: Self-pay | Admitting: Family Medicine

## 2016-01-29 ENCOUNTER — Other Ambulatory Visit: Payer: Self-pay | Admitting: Family Medicine

## 2016-01-31 ENCOUNTER — Ambulatory Visit: Payer: Commercial Managed Care - HMO | Admitting: Family Medicine

## 2016-02-27 ENCOUNTER — Encounter: Payer: Self-pay | Admitting: Family Medicine

## 2016-02-27 ENCOUNTER — Ambulatory Visit (INDEPENDENT_AMBULATORY_CARE_PROVIDER_SITE_OTHER): Payer: Commercial Managed Care - HMO | Admitting: Family Medicine

## 2016-02-27 VITALS — BP 102/55 | HR 78 | Temp 97.0°F | Ht 71.0 in | Wt 178.0 lb

## 2016-02-27 DIAGNOSIS — F4320 Adjustment disorder, unspecified: Secondary | ICD-10-CM

## 2016-02-27 DIAGNOSIS — Z23 Encounter for immunization: Secondary | ICD-10-CM | POA: Diagnosis not present

## 2016-02-27 DIAGNOSIS — F329 Major depressive disorder, single episode, unspecified: Secondary | ICD-10-CM | POA: Diagnosis not present

## 2016-02-27 DIAGNOSIS — I10 Essential (primary) hypertension: Secondary | ICD-10-CM | POA: Diagnosis not present

## 2016-02-27 DIAGNOSIS — E559 Vitamin D deficiency, unspecified: Secondary | ICD-10-CM | POA: Diagnosis not present

## 2016-02-27 DIAGNOSIS — E7800 Pure hypercholesterolemia, unspecified: Secondary | ICD-10-CM | POA: Diagnosis not present

## 2016-02-27 DIAGNOSIS — E119 Type 2 diabetes mellitus without complications: Secondary | ICD-10-CM

## 2016-02-27 DIAGNOSIS — N400 Enlarged prostate without lower urinary tract symptoms: Secondary | ICD-10-CM | POA: Diagnosis not present

## 2016-02-27 LAB — URINALYSIS, COMPLETE
Bilirubin, UA: NEGATIVE
Glucose, UA: NEGATIVE
Ketones, UA: NEGATIVE
Leukocytes, UA: NEGATIVE
NITRITE UA: NEGATIVE
PH UA: 5.5 (ref 5.0–7.5)
Protein, UA: NEGATIVE
Specific Gravity, UA: <1.005 — ABNORMAL LOW (ref 1.005–1.030)
Urobilinogen, Ur: 1 mg/dL (ref 0.2–1.0)

## 2016-02-27 LAB — MICROSCOPIC EXAMINATION
BACTERIA UA: NONE SEEN
EPITHELIAL CELLS (NON RENAL): NONE SEEN /HPF (ref 0–10)
RENAL EPITHEL UA: NONE SEEN /HPF
WBC, UA: NONE SEEN /hpf (ref 0–?)

## 2016-02-27 LAB — BAYER DCA HB A1C WAIVED: HB A1C: 5.7 % (ref <7.0)

## 2016-02-27 NOTE — Patient Instructions (Addendum)
Medicare Annual Wellness Visit  [facility] and the medical providers at [facility] strive to bring you the best medical care.  In doing so we not only want to address your current medical conditions and concerns but also to detect new conditions early and prevent illness, disease and health-related problems.    Medicare offers a yearly Wellness Visit which allows our clinical staff to assess your need for preventative services including immunizations, lifestyle education, counseling to decrease risk of preventable diseases and screening for fall risk and other medical concerns.    This visit is provided free of charge (no copay) for all Medicare recipients. The clinical pharmacists at [facility] have begun to conduct these Wellness Visits which will also include a thorough review of all your medications.    As you primary medical provider recommend that you make an appointment for your Annual Wellness Visit if you have not done so already this year.  You may set up this appointment before you leave today or you may call back [phone] and schedule an appointment.  Please make sure when you call that you mention that you are scheduling your Annual Wellness Visit with the clinical pharmacist so that the appointment may be made for the proper length of time.     Continue current medications. Continue good therapeutic lifestyle changes which include good diet and exercise. Fall precautions discussed with patient. If an FOBT was given today- please return it to our front desk. If you are over 33 years old - you may need Prevnar 72 or the adult Pneumonia vaccine.  **Flu shots are available--- please call and schedule a FLU-CLINIC appointment**  After your visit with [place] today you will receive a survey in the mail or online from [employer] regarding your care with [place]. Please take a moment to fill this out. Your feedback is very  important to [place] as you can help [place] better understand your patient needs as well as improve your experience and satisfaction. WE CARE ABOUT YOU!!!   The patient was encouraged to make every effort to get out of the house and be with people that care about him especially those at church when you both himself and his wife. He is also encouraged to forget and to move forward.

## 2016-02-27 NOTE — Progress Notes (Signed)
Subjective:    Patient ID: [name], male    DOB: 09-May-1932, 82 y.o.   MRN: [id number]  HPI Pt here for follow up and management of chronic medical problems which includes diabetes and hyperlipidemia. He is taking medications regularly.The patient is somewhat emotional today as we get closer to the Christmas holiday. He tells [place] that his wife died 6 months ago and I was not aware of this. He does complain of dry eyes. He will get the flu vaccine today. He is due to have a rectal exam and lab work and a urinalysis. He also has diabetes and we'll make sure we get an 1c. The patient has a history of having had a CABG and a hernia repair. His mother had cancer and his father had diabetes. Type 2 diabetes with hyperlipidemia. He continues to take atorvastatin for his cholesterol a baby aspirin iron replacement and metformin. He will get his flu shot today. His weight is stable from when he was here last. The patient denies any chest pain or shortness of breath. He is having no trouble with swallowing heartburn indigestion nausea vomiting diarrhea or blood in the stool. He is passing his water without problems. He says his sugars have been under good control but did not give me any numbers. He is obviously today very stressed regarding the death of his wife. He seems to be having some issues with some of his family members. His son is in prison in [place] and will be there for up to 7 or 8 years. I discussed with him the importance of going to church and being with people that he knows and at loving care about him.    Patient Active Problem List   Diagnosis Date Noted  . Anemia, iron deficiency 04/11/2014  . Orthostatic hypotension 06/22/2012  . Dizzy 06/22/2012  . Precordial pain 09/18/2011  . Unstable angina ([facility]) 07/24/2011  . CAD (coronary artery disease) 01/30/2011  . COLONIC POLYPS 06/23/2008  . Type 2 diabetes mellitus with hyperlipidemia ([facility]) 06/23/2008  . Hyperlipidemia LDL goal <70  06/23/2008  . Aortic valve disorder 06/23/2008  . BENIGN PROSTATIC HYPERTROPHY, HX OF 06/23/2008   Outpatient Encounter Prescriptions as of 02/27/2016  Medication Sig  . ACCU-CHEK SOFTCLIX LANCETS lancets Use as instructed  . aspirin 81 MG tablet Take 81 mg by mouth daily.    Marland Kitchen atorvastatin (LIPITOR) 80 MG tablet TAKE 1 TABLET (80 MG TOTAL) BY MOUTH DAILY.  Marland Kitchen Blood Glucose Monitoring Suppl (ACCU-CHEK AVIVA) device Use as instructed  . escitalopram (LEXAPRO) 10 MG tablet Take 1 tablet (10 mg total) by mouth daily.  Marland Kitchen FeFum-FePoly-FA-B Cmp-C-Biot (FOLIVANE-PLUS) CAPS TAKE 1 CAPSULE BY MOUTH DAILY.  Marland Kitchen glucose blood (ACCU-CHEK AVIVA) test strip Test BS QD and PRN  . lisinopril (PRINIVIL,ZESTRIL) 10 MG tablet TAKE ONE TABLET BY MOUTH ONE TIME DAILY  . metFORMIN (GLUCOPHAGE) 500 MG tablet TAKE 1 TABLET (500 MG TOTAL) BY MOUTH 2 (TWO) TIMES DAILY.  . metoprolol succinate (TOPROL-XL) 25 MG 24 hr tablet Take 25 mg by mouth daily.    . nitroGLYCERIN (NITROSTAT) 0.4 MG SL tablet Place 1 tablet (0.4 mg total) under the tongue every 5 (five) minutes x 3 doses as needed for chest pain.   No facility-administered encounter medications on file as of 02/27/2016.       Review of Systems  Constitutional: Negative.   HENT: Negative.   Eyes: Negative.        Dry eyes  Respiratory: Negative.  Cardiovascular: Negative.   Gastrointestinal: Negative.   Endocrine: Negative.   Genitourinary: Negative.   Musculoskeletal: Negative.   Skin: Negative.   Allergic/Immunologic: Negative.   Neurological: Negative.   Hematological: Negative.   Psychiatric/Behavioral: Negative.        Objective:   Physical Exam  Constitutional: He is oriented to person, place, and time. He appears well-developed and well-nourished.  Tearful crying and upset  HENT:  Head: Normocephalic and atraumatic.  Right Ear: External ear normal.  Left Ear: External ear normal.  Nose: Nose normal.  Mouth/Throat: Oropharynx is clear  and moist. No oropharyngeal exudate.  Eyes: Conjunctivae and EOM are normal. Pupils are equal, round, and reactive to light. Right eye exhibits no discharge. Left eye exhibits no discharge. No scleral icterus.  Neck: Normal range of motion. Neck supple. No thyromegaly present.  Anterior cervical adenopathy. There appeared to be bruits bilaterally and most likely coming from the heart.  Cardiovascular: Normal rate, regular rhythm and intact distal pulses.  Exam reveals no gallop and no friction rub.   Murmur heard. The heart has a regular rate and rhythm at 72/m with a grade 2/6 systolic ejection murmur  Pulmonary/Chest: Effort normal and breath sounds normal. No respiratory distress. He has no wheezes. He has no rales. He exhibits no tenderness.  Clear anteriorly and posteriorly no axillary adenopathy  Abdominal: Soft. Bowel sounds are normal. He exhibits no mass. There is no tenderness. There is no rebound and no guarding.  No abdominal tenderness masses or bruits or organ enlargement  Genitourinary:  Genitourinary Comments: The patient wanted me to defer this exam until the next visit as he is been having some intestinal issues and bowel problems.  Musculoskeletal: Normal range of motion. He exhibits deformity. He exhibits no edema.  Somewhat kyphotic posture  Lymphadenopathy:    He has no cervical adenopathy.  Neurological: He is alert and oriented to person, place, and time. He has normal reflexes. No cranial nerve deficit.  Skin: Skin is warm and dry. No rash noted.  Psychiatric: He has a normal mood and affect. His behavior is normal. Judgment and thought content normal.  Anxious and upset and refusing an antidepressant.  Nursing note and vitals reviewed.  BP (!) 102/55 (BP Location: Left Arm)   Pulse 78   Temp 97 F (36.1 C) (Oral)   Ht '5\' 11"'  (1.803 m)   Wt 178 lb (80.7 kg)   BMI 24.83 kg/m         Assessment & Plan:  1. Pure hypercholesterolemia -Continue current  treatment and aggressive therapeutic lifestyle changes pending results of lab work - CBC with Differential/Platelet - Lipid panel  2. Essential hypertension -Blood pressure is good today and actually somewhat low. He will continue with current treatment - BMP8+EGFR - CBC with Differential/Platelet - Hepatic function panel  3. Type 2 diabetes mellitus without complication, without long-term current use of insulin (HCC) -Continue checking blood pressure and blood sugars at home and bring readings to the office at the next visit - BMP8+EGFR - CBC with Differential/Platelet - Bayer DCA Hb A1c Waived  4. Benign prostatic hyperplasia, unspecified whether lower urinary tract symptoms present -Defer rectal exam until the next visit - CBC with Differential/Platelet - Urinalysis, Complete  5. Vitamin D deficiency -Continue current treatment pending results of lab work - CBC with Differential/Platelet - VITAMIN D 25 Hydroxy (Vit-D Deficiency, Fractures)  6. Encounter for immunization - Flu vaccine HIGH DOSE PF  7. Depression  8. Anxiety  Patient  Instructions                       Medicare Annual Wellness Visit  [facility] and the medical providers at [facility] strive to bring you the best medical care.  In doing so we not only want to address your current medical conditions and concerns but also to detect new conditions early and prevent illness, disease and health-related problems.    Medicare offers a yearly Wellness Visit which allows our clinical staff to assess your need for preventative services including immunizations, lifestyle education, counseling to decrease risk of preventable diseases and screening for fall risk and other medical concerns.    This visit is provided free of charge (no copay) for all Medicare recipients. The clinical pharmacists at [facility] have begun to conduct these Wellness Visits which will also include a  thorough review of all your medications.    As you primary medical provider recommend that you make an appointment for your Annual Wellness Visit if you have not done so already this year.  You may set up this appointment before you leave today or you may call back ([phone]) and schedule an appointment.  Please make sure when you call that you mention that you are scheduling your Annual Wellness Visit with the clinical pharmacist so that the appointment may be made for the proper length of time.     Continue current medications. Continue good therapeutic lifestyle changes which include good diet and exercise. Fall precautions discussed with patient. If an FOBT was given today- please return it to our front desk. If you are over 60 years old - you may need Prevnar 68 or the adult Pneumonia vaccine.  **Flu shots are available--- please call and schedule a FLU-CLINIC appointment**  After your visit with [place] today you will receive a survey in the mail or online from [employer] regarding your care with [place]. Please take a moment to fill this out. Your feedback is very important to [place] as you can help [place] better understand your patient needs as well as improve your experience and satisfaction. WE CARE ABOUT YOU!!!   The patient was encouraged to make every effort to get out of the house and be with people that care about him especially those at church when you both himself and his wife. He is also encouraged to forget and to move forward.  Arrie Senate MD

## 2016-02-28 LAB — CBC WITH DIFFERENTIAL/PLATELET
BASOS: 1 %
Basophils Absolute: 0.1 10*3/uL (ref 0.0–0.2)
EOS (ABSOLUTE): 0.5 10*3/uL — ABNORMAL HIGH (ref 0.0–0.4)
EOS: 5 %
HEMATOCRIT: 37.3 % — AB (ref 37.5–51.0)
Hemoglobin: 13.1 g/dL (ref 13.0–17.7)
IMMATURE GRANS (ABS): 0 10*3/uL (ref 0.0–0.1)
IMMATURE GRANULOCYTES: 0 %
LYMPHS: 20 %
Lymphocytes Absolute: 1.9 10*3/uL (ref 0.7–3.1)
MCH: 30.8 pg (ref 26.6–33.0)
MCHC: 35.1 g/dL (ref 31.5–35.7)
MCV: 88 fL (ref 79–97)
MONOS ABS: 0.8 10*3/uL (ref 0.1–0.9)
Monocytes: 8 %
NEUTROS PCT: 66 %
Neutrophils Absolute: 6.2 10*3/uL (ref 1.4–7.0)
Platelets: 215 10*3/uL (ref 150–379)
RBC: 4.26 x10E6/uL (ref 4.14–5.80)
RDW: 13.3 % (ref 12.3–15.4)
WBC: 9.4 10*3/uL (ref 3.4–10.8)

## 2016-02-28 LAB — LIPID PANEL
CHOL/HDL RATIO: 2.3 ratio (ref 0.0–5.0)
Cholesterol, Total: 124 mg/dL (ref 100–199)
HDL: 54 mg/dL (ref >39)
LDL CALC: 54 mg/dL (ref 0–99)
Triglycerides: 78 mg/dL (ref 0–149)
VLDL CHOLESTEROL CAL: 16 mg/dL (ref 5–40)

## 2016-02-28 LAB — BMP8+EGFR
BUN/Creatinine Ratio: 14 (ref 10–24)
BUN: 14 mg/dL (ref 8–27)
CO2: 24 mmol/L (ref 18–29)
CREATININE: 1 mg/dL (ref 0.76–1.27)
Calcium: 9.5 mg/dL (ref 8.6–10.2)
Chloride: 101 mmol/L (ref 96–106)
GFR, EST AFRICAN AMERICAN: 81 mL/min/{1.73_m2} (ref >59)
GFR, EST NON AFRICAN AMERICAN: 70 mL/min/{1.73_m2} (ref >59)
Glucose: 101 mg/dL — ABNORMAL HIGH (ref 65–99)
Potassium: 4.1 mmol/L (ref 3.5–5.2)
SODIUM: 143 mmol/L (ref 134–144)

## 2016-02-28 LAB — HEPATIC FUNCTION PANEL
ALT: 21 IU/L (ref 0–44)
AST: 34 IU/L (ref 0–40)
Albumin: 4.3 g/dL (ref 3.5–4.7)
Alkaline Phosphatase: 114 IU/L (ref 39–117)
BILIRUBIN TOTAL: 0.7 mg/dL (ref 0.0–1.2)
BILIRUBIN, DIRECT: 0.23 mg/dL (ref 0.00–0.40)
Total Protein: 6.9 g/dL (ref 6.0–8.5)

## 2016-02-28 LAB — VITAMIN D 25 HYDROXY (VIT D DEFICIENCY, FRACTURES): VIT D 25 HYDROXY: 40.9 ng/mL (ref 30.0–100.0)

## 2016-04-17 ENCOUNTER — Ambulatory Visit (INDEPENDENT_AMBULATORY_CARE_PROVIDER_SITE_OTHER): Payer: Commercial Managed Care - HMO | Admitting: Cardiology

## 2016-04-17 ENCOUNTER — Encounter: Payer: Self-pay | Admitting: Cardiology

## 2016-04-17 VITALS — BP 139/84 | HR 80 | Ht 71.0 in | Wt 186.0 lb

## 2016-04-17 DIAGNOSIS — R002 Palpitations: Secondary | ICD-10-CM | POA: Diagnosis not present

## 2016-04-17 DIAGNOSIS — I359 Nonrheumatic aortic valve disorder, unspecified: Secondary | ICD-10-CM

## 2016-04-17 NOTE — Progress Notes (Signed)
HPI The patient presents for followup of his aortic stenosis s/p aortic valve replacement.  He called today because he been having some palpitations at night. He said he wakes with these. His wife died 23 months ago.  His palpitations started after that and more worse and may be slowly getting better.  He is very sad.  They were together 60 years.  Prior to all of this happening he was walking but is not doing as much of that. He's not having any presyncope or syncope. He's actually not getting the dizziness he was getting in the past. He denies any chest pressure, neck or arm discomfort. He's had no new shortness of breath, PND or orthopnea. He's had no weight gain or edema.   No Known Allergies  Current Outpatient Prescriptions  Medication Sig Dispense Refill  . ACCU-CHEK SOFTCLIX LANCETS lancets Use as instructed 100 each 12  . aspirin 81 MG tablet Take 81 mg by mouth daily.      Marland Kitchen atorvastatin (LIPITOR) 80 MG tablet TAKE 1 TABLET (80 MG TOTAL) BY MOUTH DAILY. 90 tablet 1  . Blood Glucose Monitoring Suppl (ACCU-CHEK AVIVA) device Use as instructed 1 each 0  . escitalopram (LEXAPRO) 10 MG tablet Take 1 tablet (10 mg total) by mouth daily. 30 tablet 5  . FeFum-FePoly-FA-B Cmp-C-Biot (FOLIVANE-PLUS) CAPS TAKE 1 CAPSULE BY MOUTH DAILY. 90 capsule 0  . glucose blood (ACCU-CHEK AVIVA) test strip Test BS QD and PRN 100 each 11  . lisinopril (PRINIVIL,ZESTRIL) 10 MG tablet TAKE ONE TABLET BY MOUTH ONE TIME DAILY 30 tablet 4  . metFORMIN (GLUCOPHAGE) 500 MG tablet TAKE 1 TABLET (500 MG TOTAL) BY MOUTH 2 (TWO) TIMES DAILY. 180 tablet 0  . metoprolol succinate (TOPROL-XL) 25 MG 24 hr tablet Take 25 mg by mouth daily.      . nitroGLYCERIN (NITROSTAT) 0.4 MG SL tablet Place 1 tablet (0.4 mg total) under the tongue every 5 (five) minutes x 3 doses as needed for chest pain. 25 tablet 4   No current facility-administered medications for this visit.     Past Medical History:  Diagnosis Date  . Anemia      Iron deficiency  . Aortic stenosis    s/p pericardial AVR  . Arthritis   . Benign prostatic hypertrophy   . Coronary artery disease    s/p CABG 08/2010    . Diabetes mellitus   . Hyperlipidemia     Past Surgical History:  Procedure Laterality Date  . AORTIC VALVE REPLACEMENT  08/24/10   75mm pericardial tissue valve  . CORONARY ARTERY BYPASS GRAFT  08/24/10   LIMA to LAD, SVG to ramus intermediate, SVG to diagonal  . HERNIA REPAIR    . knee replacements     x 2    ROS:   Tearfulness.   Otherwise as stated in the HPI and negative for all other systems.  PHYSICAL EXAM BP 139/84   Pulse 80   Ht 5\' 11"  (1.803 m)   Wt 186 lb (84.4 kg)   BMI 25.94 kg/m  GENERAL:  Well appearing HEENT:  Pupils equal round and reactive, fundi not visualized, oral mucosa unremarkable NECK:  No jugular venous distention, waveform within normal limits, carotid upstroke brisk and symmetric, no bruits, no thyromegaly LYMPHATICS:  No cervical, inguinal adenopathy LUNGS:  Clear to auscultation bilaterally BACK:  No CVA tenderness CHEST:  Well healed sternotomy scar, prominent sternal wires HEART:  PMI not displaced or sustained,S1 and S2 within normal limits,  no S3, no S4, no clicks, no rubs, apical systolic murmur radiating to the axilla and slightly out the aortic outflow tract and into the carotid mid peaking ABD:  Flat, positive bowel sounds normal in frequency in pitch, no bruits, no rebound, no guarding, no midline pulsatile mass, no hepatomegaly, no splenomegaly EXT:  2+ pulses, no edema.  EKG:  Sinus rhythm, rate 70, left axis deviation, no acute ST T wave changes. No significant change from previous EKG.  sinus arrhythmia 04/17/2016  ASSESSMENT AND PLAN  PALPITATIONS:  These are most likely related to his grief and stress. They seem to be improving rather than getting worse and so I will monitor on him at this point. I would certainly do that if he has worsening symptoms going forward and he  will let me know.  CAD:  He has had no new symptoms since his last stress test. He will continue with risk reduction.  AS s/p AVR:   He needs a follow up echocardiogram again in May. I'll see him after this.  HTN:  No change in therapy is indicated.  DYSLIPIDEMIA:  His lipid profile was or excellent.  He will will continue meds as listed. To or  Lab Results  Component Value Date   CHOL 124 02/27/2016   TRIG 78 02/27/2016   HDL 54 02/27/2016   LDLCALC 54 02/27/2016   DM:  He has an excellent A1C as below  Lab Results  Component Value Date   HGBA1C 5.7 02/27/16

## 2016-04-17 NOTE — Patient Instructions (Signed)
Medication Instructions:  The current medical regimen is effective;  continue present plan and medications.  Testing/Procedures: Your physician has requested that you have an echocardiogram in May. Echocardiography is a painless test that uses sound waves to create images of your heart. It provides your doctor with information about the size and shape of your heart and how well your heart's chambers and valves are working. This procedure takes approximately one hour. There are no restrictions for this procedure.  Follow-Up: Follow up in May with Dr Percival Spanish after your echocardiogram.  If you need a refill on your cardiac medications before your next appointment, please call your pharmacy.  Thank you for choosing [facility]!!

## 2016-04-27 ENCOUNTER — Other Ambulatory Visit: Payer: Self-pay | Admitting: Family Medicine

## 2016-05-29 ENCOUNTER — Other Ambulatory Visit: Payer: Self-pay | Admitting: Family Medicine

## 2016-07-03 ENCOUNTER — Telehealth: Payer: Self-pay | Admitting: Family Medicine

## 2016-07-03 NOTE — Telephone Encounter (Signed)
LM - need more info = [facility] does not have openings

## 2016-07-03 NOTE — Telephone Encounter (Signed)
What symptoms do you have? Leg swelling  How long have you been sick? 2 weeks  Have you been seen for this problem?no  Pt wants appt with Dr. Laurance Flatten he refused to see another provider   Patient informed that this information will be sent to the clinical staff for review and that they should receive a follow up call.

## 2016-07-24 ENCOUNTER — Encounter: Payer: Self-pay | Admitting: Family Medicine

## 2016-07-24 ENCOUNTER — Ambulatory Visit (INDEPENDENT_AMBULATORY_CARE_PROVIDER_SITE_OTHER): Payer: Medicare HMO | Admitting: Family Medicine

## 2016-07-24 VITALS — BP 132/77 | HR 68 | Temp 97.1°F | Ht 71.0 in | Wt 184.0 lb

## 2016-07-24 DIAGNOSIS — E7800 Pure hypercholesterolemia, unspecified: Secondary | ICD-10-CM

## 2016-07-24 DIAGNOSIS — F329 Major depressive disorder, single episode, unspecified: Secondary | ICD-10-CM

## 2016-07-24 DIAGNOSIS — E559 Vitamin D deficiency, unspecified: Secondary | ICD-10-CM | POA: Diagnosis not present

## 2016-07-24 DIAGNOSIS — E119 Type 2 diabetes mellitus without complications: Secondary | ICD-10-CM

## 2016-07-24 DIAGNOSIS — N400 Enlarged prostate without lower urinary tract symptoms: Secondary | ICD-10-CM | POA: Diagnosis not present

## 2016-07-24 DIAGNOSIS — F3289 Other specified depressive episodes: Secondary | ICD-10-CM | POA: Diagnosis not present

## 2016-07-24 DIAGNOSIS — I739 Peripheral vascular disease, unspecified: Secondary | ICD-10-CM | POA: Diagnosis not present

## 2016-07-24 DIAGNOSIS — E1151 Type 2 diabetes mellitus with diabetic peripheral angiopathy without gangrene: Secondary | ICD-10-CM | POA: Diagnosis not present

## 2016-07-24 DIAGNOSIS — I10 Essential (primary) hypertension: Secondary | ICD-10-CM

## 2016-07-24 DIAGNOSIS — F32A Depression, unspecified: Secondary | ICD-10-CM

## 2016-07-24 LAB — URINALYSIS, COMPLETE
Bilirubin, UA: NEGATIVE
GLUCOSE, UA: NEGATIVE
Ketones, UA: NEGATIVE
Leukocytes, UA: NEGATIVE
NITRITE UA: NEGATIVE
PROTEIN UA: NEGATIVE
RBC, UA: NEGATIVE
Specific Gravity, UA: 1.01 (ref 1.005–1.030)
Urobilinogen, Ur: 0.2 mg/dL (ref 0.2–1.0)
pH, UA: 7 (ref 5.0–7.5)

## 2016-07-24 LAB — BAYER DCA HB A1C WAIVED: HB A1C (BAYER DCA - WAIVED): 5.8 % (ref <7.0)

## 2016-07-24 LAB — MICROSCOPIC EXAMINATION
BACTERIA UA: NONE SEEN
Epithelial Cells (non renal): NONE SEEN /hpf (ref 0–10)
RBC, UA: NONE SEEN /hpf (ref 0–?)
RENAL EPITHEL UA: NONE SEEN /HPF
WBC UA: NONE SEEN /HPF (ref 0–?)

## 2016-07-24 MED ORDER — ESCITALOPRAM OXALATE 20 MG PO TABS: 20.0000 mg | ORAL_TABLET | Freq: Every day | ORAL | 3 refills | Status: DC

## 2016-07-24 NOTE — Progress Notes (Signed)
Subjective:    Patient ID: [name], male    DOB: 1933-03-02, 83 y.o.   MRN: [id number]  HPI Pt here for follow up and management of chronic medical problems which includes diabetes, hyperlipidemia and hypertension. He is taking medication regularly.The patient is doing well overall. He does complain of being off balance at times. He is diabetic. He will get lab work today and will be given an FOBT to return. He is 83 years old and we will will do a rectal exam today. The patient is still dealing with the loss of his wife. He also has a son who is in prison in [place]. He does not have a close relationship with his family. The patient denies any chest pain or shortness of breath. He denies any trouble with his stomach including nausea vomiting diarrhea blood in the stool or black tarry bowel movements. He's passing his water without problems. He is still very tearful about losing his wife and his lack of close relationship to his children. He does not remember when he saw the cardiologist and when he is supposed to go back and we will check into this for him. He says the off balance issue is worse when he stands up quickly and it has been going on for about a year. His fasting blood sugars at home and been running in the 108 range.    Patient Active Problem List   Diagnosis Date Noted  . Anemia, iron deficiency 04/11/2014  . Orthostatic hypotension 06/22/2012  . Dizzy 06/22/2012  . Precordial pain 09/18/2011  . Unstable angina ([place]) 07/24/2011  . CAD (coronary artery disease) 01/30/2011  . COLONIC POLYPS 06/23/2008  . Type 2 diabetes mellitus with hyperlipidemia ([place]) 06/23/2008  . Hyperlipidemia LDL goal <70 06/23/2008  . Aortic valve disorder 06/23/2008  . BENIGN PROSTATIC HYPERTROPHY, HX OF 06/23/2008   Outpatient Encounter Prescriptions as of 07/24/2016  Medication Sig  . ACCU-CHEK SOFTCLIX LANCETS lancets Use as instructed  . aspirin 81 MG tablet Take 81 mg by mouth daily.    Marland Kitchen  atorvastatin (LIPITOR) 80 MG tablet TAKE 1 TABLET (80 MG TOTAL) BY MOUTH DAILY.  Marland Kitchen Blood Glucose Monitoring Suppl (ACCU-CHEK AVIVA) device Use as instructed  . escitalopram (LEXAPRO) 10 MG tablet Take 1 tablet (10 mg total) by mouth daily.  Marland Kitchen FeFum-FePoly-FA-B Cmp-C-Biot (FOLIVANE-PLUS) CAPS TAKE 1 CAPSULE BY MOUTH DAILY.  Marland Kitchen glucose blood (ACCU-CHEK AVIVA) test strip Test BS QD and PRN  . lisinopril (PRINIVIL,ZESTRIL) 10 MG tablet TAKE ONE TABLET BY MOUTH ONE TIME DAILY  . metFORMIN (GLUCOPHAGE) 500 MG tablet TAKE 1 TABLET (500 MG TOTAL) BY MOUTH 2 (TWO) TIMES DAILY.  . metoprolol succinate (TOPROL-XL) 25 MG 24 hr tablet Take 25 mg by mouth daily.    . nitroGLYCERIN (NITROSTAT) 0.4 MG SL tablet Place 1 tablet (0.4 mg total) under the tongue every 5 (five) minutes x 3 doses as needed for chest pain.   No facility-administered encounter medications on file as of 07/24/2016.       Review of Systems  Constitutional: Negative.   HENT: Negative.   Eyes: Negative.   Respiratory: Negative.   Cardiovascular: Negative.   Gastrointestinal: Negative.   Endocrine: Negative.   Genitourinary: Negative.   Musculoskeletal: Negative.   Skin: Negative.   Allergic/Immunologic: Negative.   Neurological: Negative.        "off balance"  Hematological: Negative.   Psychiatric/Behavioral: Negative.        Objective:   Physical Exam  Constitutional: He is oriented to person, place, and time. He appears well-developed and well-nourished. No distress.  The patient remains somewhat tearful regarding the loss of his wife.  HENT:  Head: Normocephalic and atraumatic.  Right Ear: External ear normal.  Left Ear: External ear normal.  Nose: Nose normal.  Mouth/Throat: Oropharynx is clear and moist. No oropharyngeal exudate.  Eyes: Conjunctivae and EOM are normal. Pupils are equal, round, and reactive to light. Right eye exhibits no discharge. Left eye exhibits no discharge. No scleral icterus.  Neck: Normal  range of motion. Neck supple. No thyromegaly present.  Cardiovascular: Normal rate, regular rhythm and intact distal pulses.   Murmur heard. The heart is regular at 72/m with a grade 3/6 systolic ejection murmur  Pulmonary/Chest: Effort normal and breath sounds normal. No respiratory distress. He has no wheezes. He has no rales. He exhibits no tenderness.  Clear anteriorly and posteriorly and no axillary adenopathy  Abdominal: Soft. Bowel sounds are normal. He exhibits no mass. There is no tenderness. There is no rebound and no guarding.  No abdominal tenderness masses bruits or organ enlargement  Genitourinary: Rectum normal and penis normal.  Genitourinary Comments: The prostate is minimally enlarged with no lumps or masses. There are no rectal masses. The external genitalia were within normal limits with no inguinal hernias being palpated.  Musculoskeletal: Normal range of motion. He exhibits no edema.  Lymphadenopathy:    He has no cervical adenopathy.  Neurological: He is alert and oriented to person, place, and time. He has normal reflexes. No cranial nerve deficit.  Skin: Skin is warm and dry. No rash noted.  Psychiatric: He has a normal mood and affect. His behavior is normal. Judgment and thought content normal.  Tearful and still somewhat depressed. We will start Lexapro 10 mg 1 daily  Nursing note and vitals reviewed.  BP 132/77 (BP Location: Left Arm)   Pulse 68   Temp 97.1 F (36.2 C) (Oral)   Ht '5\' 11"'  (1.803 m)   Wt 184 lb (83.5 kg)   BMI 25.66 kg/m         Assessment & Plan:  1. Essential hypertension -The blood pressure is good and the patient will continue with current treatment - BMP8+EGFR - CBC with Differential/Platelet - Hepatic function panel  2. Type 2 diabetes mellitus without complication, without long-term current use of insulin (HCC) -The patient's blood sugars have been under good control at home running around 108 fasting - CBC with  Differential/Platelet - Bayer DCA Hb A1c Waived  3. Vitamin D deficiency -Continue current treatment pending results of lab work - CBC with Differential/Platelet - VITAMIN D 25 Hydroxy (Vit-D Deficiency, Fractures)  4. Benign prostatic hyperplasia, unspecified whether lower urinary tract symptoms present -We will not do a PSA today. The patient is doing well with voiding. - CBC with Differential/Platelet - Urinalysis, Complete  5. Pure hypercholesterolemia -Continue with aggressive therapeutic lifestyle changes - CBC with Differential/Platelet - Lipid panel  6. Other depression -Start Lexapro 10 mg 1 daily at bedtime  7. Peripheral vascular insufficiency ([place] [facility]) -Wear support hose as directed. -Watch sodium intake  8. Type II diabetes mellitus with peripheral circulatory disorder (HCC) -Slightly decreased pulses and the right lower extremity  Patient Instructions                       Medicare Annual Wellness Visit  [facility] and the medical providers at [facility] strive to bring you the  best medical care.  In doing so we not only want to address your current medical conditions and concerns but also to detect new conditions early and prevent illness, disease and health-related problems.    Medicare offers a yearly Wellness Visit which allows our clinical staff to assess your need for preventative services including immunizations, lifestyle education, counseling to decrease risk of preventable diseases and screening for fall risk and other medical concerns.    This visit is provided free of charge (no copay) for all Medicare recipients. The clinical pharmacists at [facility] have begun to conduct these Wellness Visits which will also include a thorough review of all your medications.    As you primary medical provider recommend that you make an appointment for your Annual Wellness Visit if you have not done so already this year.   You may set up this appointment before you leave today or you may call back ([phone]) and schedule an appointment.  Please make sure when you call that you mention that you are scheduling your Annual Wellness Visit with the clinical pharmacist so that the appointment may be made for the proper length of time.     Continue current medications. Continue good therapeutic lifestyle changes which include good diet and exercise. Fall precautions discussed with patient. If an FOBT was given today- please return it to our front desk. If you are over 44 years old - you may need Prevnar 10 or the adult Pneumonia vaccine.  **Flu shots are available--- please call and schedule a FLU-CLINIC appointment**  After your visit with [place] today you will receive a survey in the mail or online from [employer] regarding your care with [place]. Please take a moment to fill this out. Your feedback is very important to [place] as you can help [place] better understand your patient needs as well as improve your experience and satisfaction. WE CARE ABOUT YOU!!!   The patient should keep his appointment with the cardiologist the end of the month on May 30 He should get a pair of good commercial support hose and start wearing these on a daily basis. He should put these on the first thing when he gets out of bed in the morning He should watch his sodium intake more closely   Arrie Senate MD

## 2016-07-24 NOTE — Patient Instructions (Addendum)
Medicare Annual Wellness Visit  [facility] and the medical providers at [facility] strive to bring you the best medical care.  In doing so we not only want to address your current medical conditions and concerns but also to detect new conditions early and prevent illness, disease and health-related problems.    Medicare offers a yearly Wellness Visit which allows our clinical staff to assess your need for preventative services including immunizations, lifestyle education, counseling to decrease risk of preventable diseases and screening for fall risk and other medical concerns.    This visit is provided free of charge (no copay) for all Medicare recipients. The clinical pharmacists at [facility] have begun to conduct these Wellness Visits which will also include a thorough review of all your medications.    As you primary medical provider recommend that you make an appointment for your Annual Wellness Visit if you have not done so already this year.  You may set up this appointment before you leave today or you may call back ([phone]) and schedule an appointment.  Please make sure when you call that you mention that you are scheduling your Annual Wellness Visit with the clinical pharmacist so that the appointment may be made for the proper length of time.     Continue current medications. Continue good therapeutic lifestyle changes which include good diet and exercise. Fall precautions discussed with patient. If an FOBT was given today- please return it to our front desk. If you are over 54 years old - you may need Prevnar 65 or the adult Pneumonia vaccine.  **Flu shots are available--- please call and schedule a FLU-CLINIC appointment**  After your visit with [place] today you will receive a survey in the mail or online from [employer] regarding your care with [place]. Please take a moment to fill this out. Your feedback is very  important to [place] as you can help [place] better understand your patient needs as well as improve your experience and satisfaction. WE CARE ABOUT YOU!!!   The patient should keep his appointment with the cardiologist the end of the month on May 30 He should get a pair of good commercial support hose and start wearing these on a daily basis. He should put these on the first thing when he gets out of bed in the morning He should watch his sodium intake more closely Start Lexapro 10 mg at bedtime

## 2016-07-24 NOTE — Addendum Note (Signed)
Addended by: Zannie Cove on: 07/24/2016 11:00 AM   Modules accepted: Orders

## 2016-07-25 ENCOUNTER — Other Ambulatory Visit: Payer: Self-pay | Admitting: Family Medicine

## 2016-07-25 LAB — BMP8+EGFR
BUN/Creatinine Ratio: 8 — ABNORMAL LOW (ref 10–24)
BUN: 8 mg/dL (ref 8–27)
CALCIUM: 9.2 mg/dL (ref 8.6–10.2)
CHLORIDE: 103 mmol/L (ref 96–106)
CO2: 28 mmol/L (ref 18–29)
Creatinine, Ser: 0.96 mg/dL (ref 0.76–1.27)
GFR calc non Af Amer: 74 mL/min/{1.73_m2} (ref >59)
GFR, EST AFRICAN AMERICAN: 85 mL/min/{1.73_m2} (ref >59)
GLUCOSE: 108 mg/dL — AB (ref 65–99)
Potassium: 4.4 mmol/L (ref 3.5–5.2)
Sodium: 144 mmol/L (ref 134–144)

## 2016-07-25 LAB — CBC WITH DIFFERENTIAL/PLATELET
BASOS ABS: 0.1 10*3/uL (ref 0.0–0.2)
Basos: 1 %
EOS (ABSOLUTE): 0.3 10*3/uL (ref 0.0–0.4)
Eos: 3 %
HEMOGLOBIN: 12.6 g/dL — AB (ref 13.0–17.7)
Hematocrit: 39 % (ref 37.5–51.0)
IMMATURE GRANS (ABS): 0 10*3/uL (ref 0.0–0.1)
IMMATURE GRANULOCYTES: 0 %
LYMPHS: 23 %
Lymphocytes Absolute: 1.9 10*3/uL (ref 0.7–3.1)
MCH: 30.3 pg (ref 26.6–33.0)
MCHC: 32.3 g/dL (ref 31.5–35.7)
MCV: 94 fL (ref 79–97)
MONOCYTES: 6 %
Monocytes Absolute: 0.5 10*3/uL (ref 0.1–0.9)
NEUTROS PCT: 67 %
Neutrophils Absolute: 5.5 10*3/uL (ref 1.4–7.0)
PLATELETS: 196 10*3/uL (ref 150–379)
RBC: 4.16 x10E6/uL (ref 4.14–5.80)
RDW: 13.7 % (ref 12.3–15.4)
WBC: 8.1 10*3/uL (ref 3.4–10.8)

## 2016-07-25 LAB — HEPATIC FUNCTION PANEL
ALBUMIN: 4 g/dL (ref 3.5–4.7)
ALT: 20 IU/L (ref 0–44)
AST: 28 IU/L (ref 0–40)
Alkaline Phosphatase: 116 IU/L (ref 39–117)
BILIRUBIN TOTAL: 0.7 mg/dL (ref 0.0–1.2)
BILIRUBIN, DIRECT: 0.2 mg/dL (ref 0.00–0.40)
TOTAL PROTEIN: 6.5 g/dL (ref 6.0–8.5)

## 2016-07-25 LAB — LIPID PANEL
CHOL/HDL RATIO: 2.5 ratio (ref 0.0–5.0)
Cholesterol, Total: 134 mg/dL (ref 100–199)
HDL: 54 mg/dL (ref >39)
LDL Calculated: 65 mg/dL (ref 0–99)
Triglycerides: 74 mg/dL (ref 0–149)
VLDL Cholesterol Cal: 15 mg/dL (ref 5–40)

## 2016-07-25 LAB — VITAMIN D 25 HYDROXY (VIT D DEFICIENCY, FRACTURES): Vit D, 25-Hydroxy: 37.8 ng/mL (ref 30.0–100.0)

## 2016-08-07 ENCOUNTER — Other Ambulatory Visit: Payer: Self-pay | Admitting: Cardiology

## 2016-08-07 DIAGNOSIS — Z952 Presence of prosthetic heart valve: Secondary | ICD-10-CM

## 2016-08-14 ENCOUNTER — Other Ambulatory Visit: Payer: Self-pay

## 2016-08-14 ENCOUNTER — Ambulatory Visit (HOSPITAL_COMMUNITY): Payer: Medicare HMO | Attending: Cardiovascular Disease

## 2016-08-14 DIAGNOSIS — Z952 Presence of prosthetic heart valve: Secondary | ICD-10-CM | POA: Diagnosis not present

## 2016-08-14 DIAGNOSIS — I071 Rheumatic tricuspid insufficiency: Secondary | ICD-10-CM | POA: Insufficient documentation

## 2016-08-14 MED ORDER — PERFLUTREN LIPID MICROSPHERE
1.0000 mL | INTRAVENOUS | Status: AC | PRN
  Administered 2016-08-14: 2 mL via INTRAVENOUS

## 2016-08-19 NOTE — Progress Notes (Signed)
HPI The patient presents for followup of his aortic stenosis s/p aortic valve replacement.    Recently he was having increased palpitations.  Since I last saw him he had a repeat echo.  This demonstrates moderate prosthetic aortic valve stenosis.  Since I last saw him he has done relatively well. He denies any cardiovascular symptoms. He has PVCs on his EKG but he says he is not feeling these. He's not had any presyncope or syncope. He did have an episode of falling when he over recently. He might get dizzy. He was helping somebody change a tire.  He denies any chest pressure, neck or arm discomfort. He's had no new shortness of breath, PND or orthopnea. He is still very very sad over the death of his wife.     No Known Allergies  Current Outpatient Prescriptions  Medication Sig Dispense Refill  . aspirin 81 MG tablet Take 81 mg by mouth daily.      Marland Kitchen atorvastatin (LIPITOR) 80 MG tablet TAKE 1 TABLET (80 MG TOTAL) BY MOUTH DAILY. 90 tablet 1  . escitalopram (LEXAPRO) 20 MG tablet Take 1 tablet (20 mg total) by mouth daily. 30 tablet 3  . FEFUM-FEPOLY-FA-B CMP-C-BIOT PO Take by mouth daily.    Marland Kitchen lisinopril (PRINIVIL,ZESTRIL) 10 MG tablet Take 1 tablet (10 mg total) by mouth daily. 30 tablet 11  . metFORMIN (GLUCOPHAGE) 500 MG tablet TAKE 1 TABLET (500 MG TOTAL) BY MOUTH 2 (TWO) TIMES DAILY. 180 tablet 1  . metoprolol succinate (TOPROL-XL) 25 MG 24 hr tablet Take 1 tablet (25 mg total) by mouth daily. 30 tablet 11  . nitroGLYCERIN (NITROSTAT) 0.4 MG SL tablet Place 1 tablet (0.4 mg total) under the tongue every 5 (five) minutes x 3 doses as needed for chest pain. 25 tablet prn  . ACCU-CHEK SOFTCLIX LANCETS lancets Use as instructed 100 each 12  . Blood Glucose Monitoring Suppl (ACCU-CHEK AVIVA) device Use as instructed 1 each 0  . glucose blood (ACCU-CHEK AVIVA) test strip Test BS QD and PRN 100 each 11   No current facility-administered medications for this visit.     Past Medical History:    Diagnosis Date  . Anemia    Iron deficiency  . Aortic stenosis    s/p pericardial AVR  . Arthritis   . Benign prostatic hypertrophy   . Coronary artery disease    s/p CABG 08/2010    . Diabetes mellitus   . Hyperlipidemia     Past Surgical History:  Procedure Laterality Date  . AORTIC VALVE REPLACEMENT  08/24/10   59mm pericardial tissue valve  . CORONARY ARTERY BYPASS GRAFT  08/24/10   LIMA to LAD, SVG to ramus intermediate, SVG to diagonal  . HERNIA REPAIR    . knee replacements     x 2    ROS:   As stated in the HPI and negative for all other systems.  PHYSICAL EXAM BP 130/80   Pulse 80   Ht 5\' 11"  (1.803 m)   Wt 185 lb (83.9 kg)   BMI 25.80 kg/m   GENERAL:  Well appearing NECK:  No jugular venous distention, waveform within normal limits, carotid upstroke brisk and symmetric, positive bilateral carotid bruits vs transmitted murmur, no thyromegaly LUNGS:  Clear to auscultation bilaterally BACK:  No CVA tenderness CHEST:  Well healed sternotomy scar. HEART:  PMI not displaced or sustained,S1 and S2 within normal limits, no S3, no S4, no clicks, no rubs, 2 out of 6 apical  early to mid peaking systolic murmur radiating slightly out the aortic outflow tract, no diastolic murmurs ABD:  Flat, positive bowel sounds normal in frequency in pitch, no bruits, no rebound, no guarding, no midline pulsatile mass, no hepatomegaly, no splenomegaly EXT:  2 plus pulses throughout, no edema, no cyanosis no clubbing  EKG:  Sinus rhythm, rate 83, axis leftward, borderline interventricular conduction delay, premature ectopic complexes, no acute ST-T wave changes.   08/21/2016  ASSESSMENT AND PLAN  PALPITATIONS:  He's not symptomatic with his PVCs. No change in therapy is indicated. He will continue meds as listed.  BRUIT:  I will order carotid Dopplers. Further evaluation will be based on these results.  CAD:  He's had no new symptoms. No change or further cardiovascular testing is  indicated.    AS s/p AVR:   He has moderate AS.  I will follow this with a repeat echocardiogram in 2019.  HTN:  The blood pressure is at target. No change in medications is indicated. We will continue with therapeutic lifestyle changes (TLC).  DYSLIPIDEMIA:  His recent LDL was 65 with and HDL of 54.  He will remain on meds as listed.   DM:  He has an excellent A1C of 5.7.  He will remain on the meds as listed.

## 2016-08-21 ENCOUNTER — Ambulatory Visit (INDEPENDENT_AMBULATORY_CARE_PROVIDER_SITE_OTHER): Payer: Medicare HMO | Admitting: Cardiology

## 2016-08-21 ENCOUNTER — Encounter: Payer: Self-pay | Admitting: Cardiology

## 2016-08-21 VITALS — BP 130/80 | HR 80 | Ht 71.0 in | Wt 185.0 lb

## 2016-08-21 DIAGNOSIS — R0989 Other specified symptoms and signs involving the circulatory and respiratory systems: Secondary | ICD-10-CM | POA: Diagnosis not present

## 2016-08-21 DIAGNOSIS — I10 Essential (primary) hypertension: Secondary | ICD-10-CM | POA: Diagnosis not present

## 2016-08-21 DIAGNOSIS — Z952 Presence of prosthetic heart valve: Secondary | ICD-10-CM | POA: Diagnosis not present

## 2016-08-21 DIAGNOSIS — R002 Palpitations: Secondary | ICD-10-CM

## 2016-08-21 HISTORY — DX: Presence of prosthetic heart valve: Z95.2

## 2016-08-21 HISTORY — DX: Other specified symptoms and signs involving the circulatory and respiratory systems: R09.89

## 2016-08-21 HISTORY — DX: Palpitations: R00.2

## 2016-08-21 HISTORY — DX: Essential (primary) hypertension: I10

## 2016-08-21 MED ORDER — NITROGLYCERIN 0.4 MG SL SUBL: 0.4000 mg | SUBLINGUAL_TABLET | SUBLINGUAL | 99 refills | Status: DC | PRN

## 2016-08-21 MED ORDER — LISINOPRIL 10 MG PO TABS: 10.0000 mg | ORAL_TABLET | Freq: Every day | ORAL | 11 refills | Status: DC

## 2016-08-21 MED ORDER — METOPROLOL SUCCINATE ER 25 MG PO TB24: 25.0000 mg | ORAL_TABLET | Freq: Every day | ORAL | 11 refills | Status: DC

## 2016-08-21 NOTE — Patient Instructions (Signed)
Medication Instructions:  The current medical regimen is effective;  continue present plan and medications.  Testing/Procedures: Your physician has requested that you have a carotid duplex. This test is an ultrasound of the carotid arteries in your neck. It looks at blood flow through these arteries that supply the brain with blood. Allow one hour for this exam. There are no restrictions or special instructions.  Your physician has requested that you have an echocardiogram in 1 year before seeing Dr Percival Spanish. Echocardiography is a painless test that uses sound waves to create images of your heart. It provides your doctor with information about the size and shape of your heart and how well your heart's chambers and valves are working. This procedure takes approximately one hour. There are no restrictions for this procedure.  Follow-Up: Follow up in 1 year with Dr. Percival Spanish.  You will receive a letter in the mail 2 months before you are due.  Please call us when you receive this letter to schedule your follow up appointment.  If you need a refill on your cardiac medications before your next appointment, please call your pharmacy.  Thank you for choosing [facility]!!

## 2016-09-06 ENCOUNTER — Ambulatory Visit (HOSPITAL_COMMUNITY)
Admission: RE | Admit: 2016-09-06 | Payer: Medicare HMO | Source: Ambulatory Visit | Attending: Cardiology | Admitting: Cardiology

## 2016-10-28 DIAGNOSIS — Z961 Presence of intraocular lens: Secondary | ICD-10-CM | POA: Diagnosis not present

## 2016-10-30 ENCOUNTER — Encounter (INDEPENDENT_AMBULATORY_CARE_PROVIDER_SITE_OTHER): Payer: Self-pay

## 2016-10-30 ENCOUNTER — Ambulatory Visit (INDEPENDENT_AMBULATORY_CARE_PROVIDER_SITE_OTHER): Payer: Medicare HMO

## 2016-10-30 ENCOUNTER — Ambulatory Visit (INDEPENDENT_AMBULATORY_CARE_PROVIDER_SITE_OTHER): Payer: Medicare HMO | Admitting: Family Medicine

## 2016-10-30 ENCOUNTER — Encounter: Payer: Self-pay | Admitting: Family Medicine

## 2016-10-30 VITALS — BP 98/58 | HR 97 | Temp 97.3°F | Ht 71.0 in | Wt 184.0 lb

## 2016-10-30 DIAGNOSIS — M25421 Effusion, right elbow: Secondary | ICD-10-CM | POA: Diagnosis not present

## 2016-10-30 DIAGNOSIS — S5001XA Contusion of right elbow, initial encounter: Secondary | ICD-10-CM | POA: Diagnosis not present

## 2016-10-30 LAB — CBC WITH DIFFERENTIAL/PLATELET
BASOS ABS: 0.1 10*3/uL (ref 0.0–0.2)
Basos: 1 %
EOS (ABSOLUTE): 0.3 10*3/uL (ref 0.0–0.4)
EOS: 3 %
HEMATOCRIT: 35.9 % — AB (ref 37.5–51.0)
HEMOGLOBIN: 11.6 g/dL — AB (ref 13.0–17.7)
Immature Grans (Abs): 0 10*3/uL (ref 0.0–0.1)
Immature Granulocytes: 0 %
LYMPHS ABS: 1.8 10*3/uL (ref 0.7–3.1)
Lymphs: 23 %
MCH: 30 pg (ref 26.6–33.0)
MCHC: 32.3 g/dL (ref 31.5–35.7)
MCV: 93 fL (ref 79–97)
MONOCYTES: 8 %
Monocytes Absolute: 0.6 10*3/uL (ref 0.1–0.9)
NEUTROS ABS: 5.1 10*3/uL (ref 1.4–7.0)
Neutrophils: 65 %
Platelets: 181 10*3/uL (ref 150–379)
RBC: 3.87 x10E6/uL — ABNORMAL LOW (ref 4.14–5.80)
RDW: 13.7 % (ref 12.3–15.4)
WBC: 7.8 10*3/uL (ref 3.4–10.8)

## 2016-10-30 NOTE — Patient Instructions (Signed)
Limit use and continue to monitor swelling If the patient develops increasing pain or fever he should get back in touch with [place] immediately We will call the right elbow films and CBC result as soon as they become available In the meantime we will wait and watch and recheck him at his regular visit in 4 weeks

## 2016-10-30 NOTE — Progress Notes (Signed)
Subjective:    Patient ID: [name], male    DOB: 15-Jun-1932, 83 y.o.   MRN: [id number]  HPI Patient here today for right elbow swelling x 1 week.The patient does not recall a specific incident when this occurred. He did fall on the elbow after the swelling occurred. Since last week he says it may be better.    Patient Active Problem List   Diagnosis Date Noted  . Palpitations 08/21/2016  . Essential hypertension 08/21/2016  . Carotid bruit 08/21/2016  . H/O aortic valve replacement 08/21/2016  . Anemia, iron deficiency 04/11/2014  . Orthostatic hypotension 06/22/2012  . Dizzy 06/22/2012  . Precordial pain 09/18/2011  . Unstable angina ([place] [facility]) 07/24/2011  . CAD (coronary artery disease) 01/30/2011  . COLONIC POLYPS 06/23/2008  . Type 2 diabetes mellitus with hyperlipidemia ([place]) 06/23/2008  . Hyperlipidemia LDL goal <70 06/23/2008  . Aortic valve disorder 06/23/2008  . BENIGN PROSTATIC HYPERTROPHY, HX OF 06/23/2008   Outpatient Encounter Prescriptions as of 10/30/2016  Medication Sig  . ACCU-CHEK SOFTCLIX LANCETS lancets Use as instructed  . aspirin 81 MG tablet Take 81 mg by mouth daily.    Marland Kitchen atorvastatin (LIPITOR) 80 MG tablet TAKE 1 TABLET (80 MG TOTAL) BY MOUTH DAILY.  Marland Kitchen escitalopram (LEXAPRO) 20 MG tablet Take 1 tablet (20 mg total) by mouth daily.  Marland Kitchen FEFUM-FEPOLY-FA-B CMP-C-BIOT PO Take by mouth daily.  Marland Kitchen glucose blood (ACCU-CHEK AVIVA) test strip Test BS QD and PRN  . lisinopril (PRINIVIL,ZESTRIL) 10 MG tablet Take 1 tablet (10 mg total) by mouth daily.  . metFORMIN (GLUCOPHAGE) 500 MG tablet TAKE 1 TABLET (500 MG TOTAL) BY MOUTH 2 (TWO) TIMES DAILY.  . metoprolol succinate (TOPROL-XL) 25 MG 24 hr tablet Take 1 tablet (25 mg total) by mouth daily.  . nitroGLYCERIN (NITROSTAT) 0.4 MG SL tablet Place 1 tablet (0.4 mg total) under the tongue every 5 (five) minutes x 3 doses as needed for chest pain.   No facility-administered encounter medications on file as of 10/30/2016.        Review of Systems  Constitutional: Negative.   HENT: Negative.   Eyes: Negative.   Respiratory: Negative.   Cardiovascular: Negative.   Gastrointestinal: Negative.   Endocrine: Negative.   Genitourinary: Negative.   Musculoskeletal: Negative.        Right elbow swelling  Skin: Negative.   Allergic/Immunologic: Negative.   Neurological: Negative.   Hematological: Negative.   Psychiatric/Behavioral: Negative.        Objective:   Physical Exam  Constitutional: He is oriented to person, place, and time. He appears well-nourished. No distress.  HENT:  Head: Normocephalic.  Eyes: Pupils are equal, round, and reactive to light. Conjunctivae and EOM are normal. Right eye exhibits no discharge. Left eye exhibits no discharge. No scleral icterus.  Neck: Normal range of motion.  Musculoskeletal: Normal range of motion. He exhibits edema and deformity. He exhibits no tenderness.  The patient has good range of motion of right arm with extension and flexion. There is soft tissue swelling over the elbow area. There is minimal if any warts and no redness and no drainage.  Neurological: He is alert and oriented to person, place, and time.  Skin: Skin is warm and dry. No rash noted. No erythema.  Psychiatric: He has a normal mood and affect. His behavior is normal. Judgment and thought content normal.  Nursing note and vitals reviewed.  BP (!) 98/58 (BP Location: Left Arm)   Pulse 97  Temp (!) 97.3 F (36.3 C) (Oral)   Ht 5\' 11"  (1.803 m)   Wt 184 lb (83.5 kg)   BMI 25.66 kg/m   CBC and right elbow films are pending===      Assessment & Plan:  1. Elbow swelling, right -This is most likely a traumatic bleed to the right elbow that was possibly spontaneous. The patient states today that it seems to be getting smaller. For this reason we will continue to wait and treat conservatively. -The patient understands if the swelling gets worse or if it becomes painful he should get  back in touch with [place] immediately. Otherwise we will recheck him at his regular appointment in 4 weeks. - CBC with Differential/Platelet - DG Elbow 2 Views Right; Future  2. Traumatic hematoma of right elbow, initial encounter -see information above.  Patient Instructions  Limit use and continue to monitor swelling If the patient develops increasing pain or fever he should get back in touch with [place] immediately We will call the right elbow films and CBC result as soon as they become available In the meantime we will wait and watch and recheck him at his regular visit in 4 weeks  Arrie Senate MD

## 2016-10-31 ENCOUNTER — Encounter (HOSPITAL_COMMUNITY): Payer: Self-pay

## 2016-10-31 ENCOUNTER — Emergency Department (HOSPITAL_COMMUNITY): Payer: Medicare HMO

## 2016-10-31 ENCOUNTER — Emergency Department (HOSPITAL_COMMUNITY)
Admission: EM | Admit: 2016-10-31 | Discharge: 2016-10-31 | Disposition: A | Discharge status: Home / Self Care | Payer: Medicare HMO | Attending: Emergency Medicine | Admitting: Emergency Medicine

## 2016-10-31 DIAGNOSIS — Z7984 Long term (current) use of oral hypoglycemic drugs: Secondary | ICD-10-CM | POA: Diagnosis not present

## 2016-10-31 DIAGNOSIS — R2681 Unsteadiness on feet: Secondary | ICD-10-CM | POA: Insufficient documentation

## 2016-10-31 DIAGNOSIS — R42 Dizziness and giddiness: Secondary | ICD-10-CM | POA: Diagnosis not present

## 2016-10-31 DIAGNOSIS — I10 Essential (primary) hypertension: Secondary | ICD-10-CM | POA: Diagnosis not present

## 2016-10-31 DIAGNOSIS — R413 Other amnesia: Secondary | ICD-10-CM | POA: Insufficient documentation

## 2016-10-31 DIAGNOSIS — Z96659 Presence of unspecified artificial knee joint: Secondary | ICD-10-CM | POA: Insufficient documentation

## 2016-10-31 DIAGNOSIS — E119 Type 2 diabetes mellitus without complications: Secondary | ICD-10-CM | POA: Diagnosis not present

## 2016-10-31 DIAGNOSIS — R404 Transient alteration of awareness: Secondary | ICD-10-CM | POA: Diagnosis not present

## 2016-10-31 DIAGNOSIS — Z7982 Long term (current) use of aspirin: Secondary | ICD-10-CM | POA: Insufficient documentation

## 2016-10-31 DIAGNOSIS — Z79899 Other long term (current) drug therapy: Secondary | ICD-10-CM | POA: Insufficient documentation

## 2016-10-31 DIAGNOSIS — I2510 Atherosclerotic heart disease of native coronary artery without angina pectoris: Secondary | ICD-10-CM | POA: Diagnosis not present

## 2016-10-31 DIAGNOSIS — Z951 Presence of aortocoronary bypass graft: Secondary | ICD-10-CM | POA: Diagnosis not present

## 2016-10-31 DIAGNOSIS — R531 Weakness: Secondary | ICD-10-CM | POA: Diagnosis not present

## 2016-10-31 DIAGNOSIS — R079 Chest pain, unspecified: Secondary | ICD-10-CM | POA: Diagnosis not present

## 2016-10-31 DIAGNOSIS — Z87891 Personal history of nicotine dependence: Secondary | ICD-10-CM | POA: Diagnosis not present

## 2016-10-31 DIAGNOSIS — Z9114 Patient's other noncompliance with medication regimen: Secondary | ICD-10-CM | POA: Diagnosis not present

## 2016-10-31 LAB — CBC WITH DIFFERENTIAL/PLATELET
BASOS ABS: 0.1 10*3/uL (ref 0.0–0.1)
Basophils Relative: 1 %
Eosinophils Absolute: 0.5 10*3/uL (ref 0.0–0.7)
Eosinophils Relative: 6 %
HEMATOCRIT: 35.9 % — AB (ref 39.0–52.0)
HEMOGLOBIN: 12 g/dL — AB (ref 13.0–17.0)
LYMPHS PCT: 22 %
Lymphs Abs: 1.7 10*3/uL (ref 0.7–4.0)
MCH: 30.2 pg (ref 26.0–34.0)
MCHC: 33.4 g/dL (ref 30.0–36.0)
MCV: 90.4 fL (ref 78.0–100.0)
MONO ABS: 0.6 10*3/uL (ref 0.1–1.0)
MONOS PCT: 8 %
NEUTROS ABS: 4.9 10*3/uL (ref 1.7–7.7)
NEUTROS PCT: 63 %
Platelets: 187 10*3/uL (ref 150–400)
RBC: 3.97 MIL/uL — ABNORMAL LOW (ref 4.22–5.81)
RDW: 13.4 % (ref 11.5–15.5)
WBC: 7.8 10*3/uL (ref 4.0–10.5)

## 2016-10-31 LAB — COMPREHENSIVE METABOLIC PANEL
ALBUMIN: 3.4 g/dL — AB (ref 3.5–5.0)
ALK PHOS: 99 U/L (ref 38–126)
ALT: 17 U/L (ref 17–63)
AST: 26 U/L (ref 15–41)
Anion gap: 8 (ref 5–15)
BUN: 18 mg/dL (ref 6–20)
CALCIUM: 9.1 mg/dL (ref 8.9–10.3)
CHLORIDE: 107 mmol/L (ref 101–111)
CO2: 26 mmol/L (ref 22–32)
CREATININE: 1.03 mg/dL (ref 0.61–1.24)
GFR calc Af Amer: >60 mL/min (ref >60)
GFR calc non Af Amer: >60 mL/min (ref >60)
GLUCOSE: 107 mg/dL — AB (ref 65–99)
Potassium: 4.1 mmol/L (ref 3.5–5.1)
SODIUM: 141 mmol/L (ref 135–145)
Total Bilirubin: 0.8 mg/dL (ref 0.3–1.2)
Total Protein: 6.5 g/dL (ref 6.5–8.1)

## 2016-10-31 LAB — PROTIME-INR
INR: 1.03
Prothrombin Time: 13.5 seconds (ref 11.4–15.2)

## 2016-10-31 LAB — BRAIN NATRIURETIC PEPTIDE: B Natriuretic Peptide: 112 pg/mL — ABNORMAL HIGH (ref 0.0–100.0)

## 2016-10-31 LAB — MAGNESIUM: Magnesium: 1.8 mg/dL (ref 1.7–2.4)

## 2016-10-31 LAB — TROPONIN I: Troponin I: <0.03 ng/mL (ref <0.03)

## 2016-10-31 MED ORDER — ASPIRIN 81 MG PO CHEW
324.0000 mg | CHEWABLE_TABLET | Freq: Once | ORAL | Status: AC
  Administered 2016-10-31: 243 mg via ORAL
  Filled 2016-10-31: qty 4

## 2016-10-31 NOTE — ED Notes (Signed)
MD at the bedside  

## 2016-10-31 NOTE — ED Notes (Signed)
Transported to MRI

## 2016-10-31 NOTE — Discharge Instructions (Signed)
As discussed, your evaluation today has been largely reassuring.  But, it is important that you monitor your condition carefully, and do not hesitate to return to the ED if you develop new, or concerning changes in your condition.   There is evidence on the MRI of narrowing of the blood vessels of your brain.  This is likely contributing to your dizziness. Please be sure to follow-up with our neurologist and your primary care physician.

## 2016-10-31 NOTE — ED Provider Notes (Signed)
[facility] DEPT Provider Note   CSN: [id number] Arrival date & time: 10/31/16  1400     History   Chief Complaint Chief Complaint  Patient presents with  . Dizziness    HPI [name] is a 83 y.o. male.  HPI  Patient presents with concern of dizziness. He notes that for about the past week he has felt unsteady on his feet. No clear precipitant. Since onset symptoms have been intermittent. Today, while attempting to work he felt particularly dizzy while walking. No new weakness in any extremity, no confusion, no disorientation, no pain. No vision loss. He denies history of substantial medical disease, states that he is generally well.  Eventually the patient also notes that his wife died a few months ago, and he has been inconsistently taking his medication since that time.   Past Medical History:  Diagnosis Date  . Anemia    Iron deficiency  . Aortic stenosis    s/p pericardial AVR  . Arthritis   . Benign prostatic hypertrophy   . Coronary artery disease    s/p CABG 08/2010    . Diabetes mellitus   . Hyperlipidemia     Patient Active Problem List   Diagnosis Date Noted  . Palpitations 08/21/2016  . Essential hypertension 08/21/2016  . Carotid bruit 08/21/2016  . H/O aortic valve replacement 08/21/2016  . Anemia, iron deficiency 04/11/2014  . Orthostatic hypotension 06/22/2012  . Dizzy 06/22/2012  . Precordial pain 09/18/2011  . Unstable angina ([place]) 07/24/2011  . CAD (coronary artery disease) 01/30/2011  . COLONIC POLYPS 06/23/2008  . Type 2 diabetes mellitus with hyperlipidemia ([place]) 06/23/2008  . Hyperlipidemia LDL goal <70 06/23/2008  . Aortic valve disorder 06/23/2008  . BENIGN PROSTATIC HYPERTROPHY, HX OF 06/23/2008    Past Surgical History:  Procedure Laterality Date  . AORTIC VALVE REPLACEMENT  08/24/10   65mm pericardial tissue valve  . CORONARY ARTERY BYPASS GRAFT  08/24/10   LIMA to LAD, SVG to ramus intermediate, SVG to diagonal  .  HERNIA REPAIR    . knee replacements     x 2       Home Medications    Prior to Admission medications   Medication Sig Start Date End Date Taking? Authorizing Provider  ACCU-CHEK SOFTCLIX LANCETS lancets Use as instructed 09/15/15   Chipper Herb, MD  aspirin 81 MG tablet Take 81 mg by mouth daily.      [provider]  atorvastatin (LIPITOR) 80 MG tablet TAKE 1 TABLET (80 MG TOTAL) BY MOUTH DAILY. 07/25/16   Chipper Herb, MD  escitalopram (LEXAPRO) 20 MG tablet Take 1 tablet (20 mg total) by mouth daily. 07/24/16   Chipper Herb, MD  FEFUM-FEPOLY-FA-B CMP-C-BIOT PO Take by mouth daily.    [provider]  glucose blood (ACCU-CHEK AVIVA) test strip Test BS QD and PRN 11/23/15   Chipper Herb, MD  lisinopril (PRINIVIL,ZESTRIL) 10 MG tablet Take 1 tablet (10 mg total) by mouth daily. 08/21/16   Minus Breeding, MD  metFORMIN (GLUCOPHAGE) 500 MG tablet TAKE 1 TABLET (500 MG TOTAL) BY MOUTH 2 (TWO) TIMES DAILY. 07/25/16   Chipper Herb, MD  metoprolol succinate (TOPROL-XL) 25 MG 24 hr tablet Take 1 tablet (25 mg total) by mouth daily. 08/21/16   Minus Breeding, MD  nitroGLYCERIN (NITROSTAT) 0.4 MG SL tablet Place 1 tablet (0.4 mg total) under the tongue every 5 (five) minutes x 3 doses as needed for chest pain. 08/21/16  Minus Breeding, MD    Family History Family History  Problem Relation Age of Onset  . Cancer Mother   . Diabetes Father   . Hypertension Other   . Diabetes Other     Social History Social History  Substance Use Topics  . Smoking status: Former Smoker    Types: Cigars    Quit date: 09/17/1993  . Smokeless tobacco: Never Used  . Alcohol use Yes     Comment: occassional     Allergies   Patient has no known allergies.   Review of Systems Review of Systems  Constitutional:       Per HPI, otherwise negative  HENT:       Per HPI, otherwise negative  Respiratory:       Per HPI, otherwise negative  Cardiovascular:       Per HPI,  otherwise negative  Gastrointestinal: Negative for vomiting.  Endocrine:       Negative aside from HPI  Genitourinary:       Neg aside from HPI   Musculoskeletal:       Per HPI, otherwise negative  Skin: Negative.   Neurological: Positive for dizziness and light-headedness. Negative for tremors, seizures, syncope, facial asymmetry, speech difficulty, weakness, numbness and headaches.     Physical Exam Updated Vital Signs BP 133/66   Pulse 64   Temp 98.1 F (36.7 C) (Oral)   Resp 15   Ht 5\' 11"  (1.803 m)   Wt 83.5 kg (184 lb)   SpO2 100%   BMI 25.66 kg/m   Physical Exam  Constitutional: He is oriented to person, place, and time. He appears well-developed. No distress.  HENT:  Head: Normocephalic and atraumatic.  Eyes: Conjunctivae and EOM are normal.  Cardiovascular: Normal rate and regular rhythm.   Pulmonary/Chest: Effort normal. No stridor. No respiratory distress.  Abdominal: He exhibits no distension.  Musculoskeletal: He exhibits no edema.  Neurological: He is alert and oriented to person, place, and time. He displays atrophy. He displays no tremor. No cranial nerve deficit. He exhibits normal muscle tone. Gait abnormal.  Attempts at walking resulted in slow unsteady gait.  Skin: Skin is warm and dry.  Psychiatric: He has a normal mood and affect.  Nursing note and vitals reviewed.    ED Treatments / Results  Labs (all labs ordered are listed, but only abnormal results are displayed) Labs Reviewed  COMPREHENSIVE METABOLIC PANEL - Abnormal; Notable for the following:       Result Value   Glucose, Bld 107 (*)    Albumin 3.4 (*)    All other components within normal limits  BRAIN NATRIURETIC PEPTIDE - Abnormal; Notable for the following:    B Natriuretic Peptide 112.0 (*)    All other components within normal limits  CBC WITH DIFFERENTIAL/PLATELET - Abnormal; Notable for the following:    RBC 3.97 (*)    Hemoglobin 12.0 (*)    HCT 35.9 (*)    All other  components within normal limits  MAGNESIUM  TROPONIN I  PROTIME-INR    EKG  EKG Interpretation  Date/Time:  Thursday October 31 2016 14:20:04 EDT Ventricular Rate:  82 PR Interval:    QRS Duration: 115 QT Interval:  395 QTC Calculation: 462 R Axis:   -26 Text Interpretation:  Sinus rhythm Nonspecific intraventricular conduction delay T wave abnormality Artifact No significant change since last tracing Abnormal ekg Confirmed by Carmin Muskrat [phone]) on 10/31/2016 2:32:01 PM       Radiology Dg  Elbow 2 Views Right  Result Date: 10/31/2016 CLINICAL DATA:  Acute right elbow swelling and pain. EXAM: RIGHT ELBOW - 2 VIEW COMPARISON:  None. FINDINGS: Posterior soft tissue swelling is noted. No acute fracture, subluxation or dislocation identified. No suspicious focal bony abnormalities are present. There is no evidence of elbow effusion. IMPRESSION: Posterior soft tissue swelling -question olecranon bursitis. No acute bony abnormalities. Electronically Signed   By: Margarette Canada M.D.   On: 10/31/2016 14:47   Mr Brain Wo Contrast (neuro Protocol)  Result Date: 10/31/2016 CLINICAL DATA:  Dizziness and forgetfulness.  Ataxia. EXAM: MRI HEAD WITHOUT CONTRAST TECHNIQUE: Multiplanar, multiecho pulse sequences of the brain and surrounding structures were obtained without intravenous contrast. COMPARISON:  Head CT 08/18/2007 FINDINGS: Brain: The midline structures are normal. There is no focal diffusion restriction to indicate acute infarct. There is confluent hyperintense T2-weighted signal within the periventricular white matter, most often seen in the setting of chronic microvascular ischemia. No intraparenchymal hematoma or chronic microhemorrhage. There is advanced ventriculomegaly and prominence of the extra-axial CSF spaces compatible with global atrophy. Suspected frontotemporal predominance. The degree of atrophy has worsened relative to the 08/18/2007 examination. The dura is normal and there is no  extra-axial collection. Vascular: Abnormal right vertebral artery V4 segment flow void. The other visible flow voids are normal. Skull and upper cervical spine: The visualized skull base, calvarium, upper cervical spine and extracranial soft tissues are normal. Sinuses/Orbits: No fluid levels or advanced mucosal thickening. No mastoid or middle ear effusion. Normal orbits. IMPRESSION: 1. No acute intracranial abnormality. 2. Worsening of atrophy compared to the examination of 08/18/2007. There is suspected frontotemporal dominance. Quantitative, volumetric MRI of the brain may be helpful for more specific evaluation for characteristic atrophy patterns associated with dementia. 3. Chronic microvascular ischemia. 4. Loss of the normal right vertebral artery V4 segment flow void, of uncertain chronicity. This may indicate occlusion or slow flow. Electronically Signed   By: Ulyses Jarred M.D.   On: 10/31/2016 15:32   Dg Chest Portable 1 View  Result Date: 10/31/2016 CLINICAL DATA:  Acute chest pain and dizziness. EXAM: PORTABLE CHEST 1 VIEW COMPARISON:  None. FINDINGS: Cardiomegaly and median sternotomy noted. There is no evidence of focal airspace disease, pulmonary edema, suspicious pulmonary nodule/mass, pleural effusion, or pneumothorax. No acute bony abnormalities are identified. IMPRESSION: Cardiomegaly without evidence of acute cardiopulmonary disease. Electronically Signed   By: Margarette Canada M.D.   On: 10/31/2016 14:35    Procedures Procedures (including critical care time)  Medications Ordered in ED Medications  aspirin chewable tablet 324 mg (243 mg Oral Given 10/31/16 1444)     Initial Impression / Assessment and Plan / ED Course  I have reviewed the triage vital signs and the nursing notes.  Pertinent labs & imaging results that were available during my care of the patient were reviewed by me and considered in my medical decision making (see chart for details).  On repeat exam the patient is  calm, in no distress. I discussed all findings, including MRI suggesting worsening atrophy as well as some diminished flow in the vertebral artery, though no evidence for acute new stroke with him. Given the symptoms persistency for 1 week, absence of notable abnormal findings on MRI, labs, patient will follow up with neurology as an outpatient.   Final Clinical Impressions(s) / ED Diagnoses   Final diagnoses:  Dizziness     Carmin Muskrat, MD 10/31/16 2123

## 2016-10-31 NOTE — ED Notes (Signed)
Lab at the bedside 

## 2016-10-31 NOTE — ED Triage Notes (Signed)
Patient from home, reports of dizziness x1 week but worse today. States his wife died in Oct 21, 2022 and hasn't been able to remember things as usual. Patient a&ox4 at this time. Patient tearful.

## 2016-11-28 ENCOUNTER — Ambulatory Visit: Payer: Medicare HMO | Admitting: Family Medicine

## 2016-11-30 ENCOUNTER — Encounter: Payer: Self-pay | Admitting: Family Medicine

## 2016-12-19 ENCOUNTER — Encounter: Payer: Self-pay | Admitting: *Deleted

## 2017-02-03 ENCOUNTER — Ambulatory Visit (INDEPENDENT_AMBULATORY_CARE_PROVIDER_SITE_OTHER): Payer: Medicare HMO | Admitting: *Deleted

## 2017-02-03 DIAGNOSIS — Z23 Encounter for immunization: Secondary | ICD-10-CM | POA: Diagnosis not present

## 2017-03-27 ENCOUNTER — Telehealth: Payer: Self-pay | Admitting: *Deleted

## 2017-03-27 NOTE — Telephone Encounter (Signed)
Pt walked in today after falling yesterday on cement slab landing on his left side and hitting head on cement. Pt states he never lost consciousness, denies headache, visual disturbances, confusion or dizziness. Pt drove himself over here and BP 145/79 pulse 74 in Triage office, no slurring of speech and pt is alert and oriented today. Pt is c/o left side pain and left arm soreness but can move left arm and rotate at shoulder joint without too much pain. Pt advised we don't have any openings today so he could go to Urgent Care of pt was offered appt with Dr Lajuana Ripple tomorrow at 9:30 which pt accepted. Pt advised to call 911 or go to ED if he experiences any visual disturbances, dizziness, headaches or confusion. Pt voiced understanding.

## 2017-03-28 ENCOUNTER — Encounter: Payer: Self-pay | Admitting: Family Medicine

## 2017-03-28 ENCOUNTER — Ambulatory Visit: Payer: Medicare HMO | Admitting: Family Medicine

## 2017-03-28 ENCOUNTER — Ambulatory Visit (HOSPITAL_COMMUNITY)
Admission: RE | Admit: 2017-03-28 | Discharge: 2017-03-28 | Disposition: A | Discharge status: Home / Self Care | Payer: Medicare HMO | Source: Ambulatory Visit | Attending: Family Medicine | Admitting: Family Medicine

## 2017-03-28 VITALS — BP 153/80 | HR 88 | Temp 97.3°F | Wt 189.0 lb

## 2017-03-28 DIAGNOSIS — G319 Degenerative disease of nervous system, unspecified: Secondary | ICD-10-CM | POA: Diagnosis not present

## 2017-03-28 DIAGNOSIS — G44319 Acute post-traumatic headache, not intractable: Secondary | ICD-10-CM | POA: Insufficient documentation

## 2017-03-28 DIAGNOSIS — I6523 Occlusion and stenosis of bilateral carotid arteries: Secondary | ICD-10-CM | POA: Insufficient documentation

## 2017-03-28 DIAGNOSIS — S0990XA Unspecified injury of head, initial encounter: Secondary | ICD-10-CM

## 2017-03-28 DIAGNOSIS — R9082 White matter disease, unspecified: Secondary | ICD-10-CM | POA: Diagnosis not present

## 2017-03-28 DIAGNOSIS — W19XXXA Unspecified fall, initial encounter: Secondary | ICD-10-CM | POA: Diagnosis not present

## 2017-03-28 NOTE — Patient Instructions (Signed)
I have ordered a scan of your head to make sure that everything looks okay.  I would prefer that you be driven by someone else given your recent head injury.  I offered to have you brought to the emergency department by ambulance but you declined.   Head Injury, Adult There are many types of head injuries. They can be as minor as a bump. Some head injuries can be worse. Worse injuries include:  A strong hit to the head that hurts the brain (concussion).  A bruise of the brain (contusion). This means there is bleeding in the brain that can cause swelling.  A cracked skull (skull fracture).  Bleeding in the brain that gathers, gets thick (makes a clot), and forms a bump (hematoma).  Most problems from a head injury come in the first 24 hours. However, you may still have side effects up to 7-10 days after your injury. It is important to watch your condition for any changes. Follow these instructions at home: Activity  Rest as much as possible.  Avoid activities that are hard or tiring.  Make sure you get enough sleep.  Limit activities that need a lot of thought or attention, such as: ? Watching TV. ? Playing memory games and puzzles. ? Job-related work or homework. ? Working on [employer], and texting.  Avoid activities that could cause another head injury until your doctor says it is okay. This includes playing sports.  Ask your doctor when it is safe for you to go back to your normal activities, such as work or school. Ask your doctor for a step-by-step plan for slowly going back to your normal activities.  Ask your doctor when you can drive, ride a bicycle, or use heavy machinery. Never do these activities if you are dizzy. Lifestyle  Do not drink alcohol until your doctor says it is okay.  Avoid drug use.  If it is harder than usual to remember things, write them down.  If you are easily distracted, try to do one thing at a time.  Talk with family  members or close friends when making important decisions.  Tell your friends, family, a trusted coworker, and work [employer] about your injury, symptoms, and limits (restrictions). Have them watch for any problems that are new or getting worse. General instructions  Take over-the-counter and prescription medicines only as told by your doctor.  Have someone stay with you for 24 hours after your head injury. This person should watch you for any changes in your symptoms and be ready to get help.  Keep all follow-up visits as told by your doctor. This is important. How is this prevented?  Work on [employer]. This can help you avoid falls.  Wear a seatbelt when you are in a moving vehicle.  Wear a helmet when: ? Riding a bicycle. ? Skiing. ? Doing any other sport or activity that has a risk of injury.  Drink alcohol only in moderation.  Make your home safer by: ? Getting rid of clutter from the floors and stairs, like things that can make you trip. ? Using grab bars in bathrooms and handrails by stairs. ? Placing non-slip mats on floors and in bathtubs. ? Putting more light in dim areas. Get help right away if:  You have: ? A very bad (severe) headache that is not helped by medicine. ? Trouble walking or weakness in your arms and legs. ? Clear or bloody fluid coming from your nose  or ears. ? Changes in your seeing (vision). ? Jerky movements that you cannot control (seizure).  You throw up (vomit).  Your symptoms get worse.  You lose balance.  Your speech is slurred.  You pass out.  You are sleepier and have trouble staying awake.  The black centers of your eyes (pupils) change in size. These symptoms may be an emergency. Do not wait to see if the symptoms will go away. Get medical help right away. Call your local emergency services (911 in the [place] Do not drive yourself to the hospital. This information is not intended to replace advice given to you by  your health care provider. Make sure you discuss any questions you have with your health care provider. Document Released: 02/22/2008 Document Revised: 07/05/2016 Document Reviewed: 09/19/2015 Elsevier Interactive Patient Education  [employer].

## 2017-03-28 NOTE — Progress Notes (Signed)
Subjective: CC: fall PCP: Chipper Herb, MD RCV:ELFYB R Rath is a 84 y.o. male presenting to clinic today for:  1. Fall Patient reports that he sustained a mechanical fall and fell onto his left side, hitting the left side of his head yesterday.  He was actually seen here in clinic by the nurse and recommended to go to urgent care for further evaluation as there were no open appointments.  Patient instead asked for an appointment during today's same day clinic.  He notes that he has had a persistent mild headache since yesterday.  Denies visual disturbance, dizziness, confusion, weakness, numbness or tingling, gait abnormality.  He resides at home alone.  He is a widower.  He notes that yesterday he laid on the ground for about 30 minutes before getting up.  He has been taking his daily aspirin but is on no other anticoagulant.  He notes some discomfort in the left upper extremity but otherwise is asymptomatic.   ROS: Per HPI  No Known Allergies Past Medical History:  Diagnosis Date  . Anemia    Iron deficiency  . Aortic stenosis    s/p pericardial AVR  . Arthritis   . Benign prostatic hypertrophy   . Coronary artery disease    s/p CABG 08/2010    . Diabetes mellitus   . Hyperlipidemia     Current Outpatient Medications:  .  ACCU-CHEK SOFTCLIX LANCETS lancets, Use as instructed, Disp: 100 each, Rfl: 12 .  aspirin 81 MG tablet, Take 81 mg by mouth daily.  , Disp: , Rfl:  .  atorvastatin (LIPITOR) 80 MG tablet, TAKE 1 TABLET (80 MG TOTAL) BY MOUTH DAILY., Disp: 90 tablet, Rfl: 1 .  escitalopram (LEXAPRO) 20 MG tablet, Take 1 tablet (20 mg total) by mouth daily., Disp: 30 tablet, Rfl: 3 .  FEFUM-FEPOLY-FA-B CMP-C-BIOT PO, Take by mouth daily., Disp: , Rfl:  .  glucose blood (ACCU-CHEK AVIVA) test strip, Test BS QD and PRN, Disp: 100 each, Rfl: 11 .  lisinopril (PRINIVIL,ZESTRIL) 10 MG tablet, Take 1 tablet (10 mg total) by mouth daily., Disp: 30 tablet, Rfl: 11 .  metFORMIN  (GLUCOPHAGE) 500 MG tablet, TAKE 1 TABLET (500 MG TOTAL) BY MOUTH 2 (TWO) TIMES DAILY., Disp: 180 tablet, Rfl: 1 .  metoprolol succinate (TOPROL-XL) 25 MG 24 hr tablet, Take 1 tablet (25 mg total) by mouth daily., Disp: 30 tablet, Rfl: 11 .  nitroGLYCERIN (NITROSTAT) 0.4 MG SL tablet, Place 1 tablet (0.4 mg total) under the tongue every 5 (five) minutes x 3 doses as needed for chest pain., Disp: 25 tablet, Rfl: prn Social History   Socioeconomic History  . Marital status: Married    Spouse name: Not on file  . Number of children: Not on file  . Years of education: Not on file  . Highest education level: Not on file  Social Needs  . Financial resource strain: Not on file  . Food insecurity - worry: Not on file  . Food insecurity - inability: Not on file  . Transportation needs - medical: Not on file  . Transportation needs - non-medical: Not on file  Occupational History  . Not on file  Tobacco Use  . Smoking status: Former Smoker    Types: Cigars    Last attempt to quit: 09/17/1993    Years since quitting: 23.5  . Smokeless tobacco: Never Used  Substance and Sexual Activity  . Alcohol use: Yes    Comment: occassional  . Drug use:  No  . Sexual activity: Yes  Other Topics Concern  . Not on file  Social History Narrative  . Not on file   Family History  Problem Relation Age of Onset  . Cancer Mother   . Diabetes Father   . Hypertension Other   . Diabetes Other     Objective: Office vital signs reviewed. BP (!) 153/80   Pulse 88   Temp (!) 97.3 F (36.3 C) (Oral)   Wt 189 lb (85.7 kg)   BMI 26.36 kg/m   Physical Examination:  General: Awake, alert, elderly, No acute distress HEENT: 1.5 in area of ecchymosis along the left temporal region.  No palpable cranial abnormalities.    Neck: has full, painless AROM. No midline TTP.    Ears: Tympanic membranes intact, normal light reflex, no erythema, no bulging, no hemotympanum.     Eyes: PERRLA, extraocular membranes  intact, sclera white, evidence of previous cataract surgery bilaterally.    Nose: nasal turbinates moist, no nasal discharge    Throat: moist mucus membranes, symmetric rise of palate appreciated. MSK: 5/5 UE and LE strength.  Active range of motion of bilateral upper extremities reduced in abduction of the shoulder by about 20 degrees. Neuro: Alert and oriented x2.  Reports that the year is 1936.  Follows all commands.  Cranial nerves II through XII grossly intact, except for poor hearing.  Responds to questions appropriately.  Follows commands.  Slight dysmetria appreciated with upper cerebellar testing on the right.  Gait somewhat unsteady but I am unsure of his baseline. Skin: Bruising noted as above.  Assessment/ Plan: 84 y.o. male   1. Injury of head, initial encounter His exam was significant for reporting that the year was 1936, decreased hearing, slight dysmetria on right sided upper extremity testing.  Additionally, his gait was somewhat unsteady.  I am unsure of his baseline and therefore I highly recommended the patient be evaluated in the emergency department given his age, use of aspirin daily, and recent injury but patient refused transport or evaluation in the emergency department.  He has capacity during today's exam.  I have ordered a CT of his head.  I highly recommended that patient arrange to have somebody drive him around while he is waiting to be evaluated further.  He seemed somewhat resistant to this. Strict return precautions and reasons for emergent evaluation in the emergency department review with patient.  He voiced understanding and will follow-up as needed.  2. Fall, initial encounter See above  3. Acute post-traumatic headache, not intractable - CT Head Wo Contrast; Future   Orders Placed This Encounter  Procedures  . CT Head Wo Contrast    Standing Status:   Future    Standing Expiration Date:   06/27/2018    Order Specific Question:   Preferred imaging  location?    Answer:   [facility]    Order Specific Question:   Call Results- Best Contact Number?    Answer:   [phone]   hold patient     Order Specific Question:   Radiology Contrast Protocol - do NOT remove file path    Answer:   [email or web address]   Janora Norlander, [place] [phone]

## 2017-03-29 ENCOUNTER — Telehealth: Payer: Self-pay | Admitting: *Deleted

## 2017-03-29 NOTE — Telephone Encounter (Signed)
-----   Message from Janora Norlander, DO sent at 03/28/2017  1:47 PM EST ----- Please inform of negative CT head.  No evidence of bleed or fracture.  If he develops any other worrisome symptoms or signs that we discussed, including confusion, malaise, severe worsening headache, nausea, vomiting, imbalance, weakness or numbness and tingling, please seek immediate medical attention in the emergency department.

## 2017-04-01 ENCOUNTER — Encounter: Payer: Self-pay | Admitting: Family Medicine

## 2017-04-01 ENCOUNTER — Ambulatory Visit (INDEPENDENT_AMBULATORY_CARE_PROVIDER_SITE_OTHER): Payer: Medicare HMO | Admitting: Family Medicine

## 2017-04-01 VITALS — BP 150/77 | HR 81 | Temp 97.6°F | Ht 71.0 in | Wt 188.0 lb

## 2017-04-01 DIAGNOSIS — W19XXXD Unspecified fall, subsequent encounter: Secondary | ICD-10-CM

## 2017-04-01 DIAGNOSIS — S20212A Contusion of left front wall of thorax, initial encounter: Secondary | ICD-10-CM | POA: Diagnosis not present

## 2017-04-01 NOTE — Patient Instructions (Addendum)
I value your feedback and appreciate you entrusting [place] with your care.  If you get a survey, I would appreciate your taking the time to let us know what your experience was like.  You may use Tylenol if needed for rib pain and apply ice to the area on your ribs that is painful if your pain returns.  Rib Contusion A rib contusion is a deep bruise on your rib area. Contusions are the result of a blunt trauma that causes bleeding and injury to the tissues under the skin. A rib contusion may involve bruising of the ribs and of the skin and muscles in the area. The skin overlying the contusion may turn blue, purple, or yellow. Minor injuries will give you a painless contusion, but more severe contusions may stay painful and swollen for a few weeks. What are the causes? A contusion is usually caused by a blow, trauma, or direct force to an area of the body. This often occurs while playing contact sports. What are the signs or symptoms?  Swelling and redness of the injured area.  Discoloration of the injured area.  Tenderness and soreness of the injured area.  Pain with or without movement. How is this diagnosed? The diagnosis can be made by taking a medical history and performing a physical exam. An X-ray, CT scan, or MRI may be needed to determine if there were any associated injuries, such as broken bones (fractures) or internal injuries. How is this treated? Often, the best treatment for a rib contusion is rest. Icing or applying cold compresses to the injured area may help reduce swelling and inflammation. Deep breathing exercises may be recommended to reduce the risk of partial lung collapse and pneumonia. Over-the-counter or prescription medicines may also be recommended for pain control. Follow these instructions at home:  Apply ice to the injured area: ? Put ice in a plastic bag. ? Place a towel between your skin and the bag. ? Leave the ice on for 20 minutes, 2-3 times per day.  Take  medicines only as directed by your health care provider.  Rest the injured area. Avoid strenuous activity and any activities or movements that cause pain. Be careful during activities and avoid bumping the injured area.  Perform deep-breathing exercises as directed by your health care provider.  Do not lift anything that is heavier than 5 lb (2.3 kg) until your health care provider approves.  Do not use any tobacco products, including cigarettes, chewing tobacco, or electronic cigarettes. If you need help quitting, ask your health care provider. Contact a health care provider if:  You have increased bruising or swelling.  You have pain that is not controlled with treatment.  You have a fever. Get help right away if:  You have difficulty breathing or shortness of breath.  You develop a continual cough, or you cough up thick or bloody sputum.  You feel sick to your stomach (nauseous), you throw up (vomit), or you have abdominal pain. This information is not intended to replace advice given to you by your health care provider. Make sure you discuss any questions you have with your health care provider. Document Released: 12/04/2000 Document Revised: 08/17/2015 Document Reviewed: 12/21/2013 Elsevier Interactive Patient Education  2018 [place] in the Home Falls can cause injuries. They can happen to people of all ages. There are many things you can do to make your home safe and to help prevent falls. What can I do on the  outside of my home?  Regularly fix the edges of walkways and driveways and fix any cracks.  Remove anything that might make you trip as you walk through a door, such as a raised step or threshold.  Trim any bushes or trees on the path to your home.  Use bright outdoor lighting.  Clear any walking paths of anything that might make someone trip, such as rocks or tools.  Regularly check to see if handrails are loose or broken. Make sure that  both sides of any steps have handrails.  Any raised decks and porches should have guardrails on the edges.  Have any leaves, snow, or ice cleared regularly.  Use sand or salt on walking paths during winter.  Clean up any spills in your garage right away. This includes oil or grease spills. What can I do in the bathroom?  Use night lights.  Install grab bars by the toilet and in the tub and shower. Do not use towel bars as grab bars.  Use non-skid mats or decals in the tub or shower.  If you need to sit down in the shower, use a plastic, non-slip stool.  Keep the floor dry. Clean up any water that spills on the floor as soon as it happens.  Remove soap buildup in the tub or shower regularly.  Attach bath mats securely with double-sided non-slip rug tape.  Do not have throw rugs and other things on the floor that can make you trip. What can I do in the bedroom?  Use night lights.  Make sure that you have a light by your bed that is easy to reach.  Do not use any sheets or blankets that are too big for your bed. They should not hang down onto the floor.  Have a firm chair that has side arms. You can use this for support while you get dressed.  Do not have throw rugs and other things on the floor that can make you trip. What can I do in the kitchen?  Clean up any spills right away.  Avoid walking on wet floors.  Keep items that you use a lot in easy-to-reach places.  If you need to reach something above you, use a strong step stool that has a grab bar.  Keep electrical cords out of the way.  Do not use floor polish or wax that makes floors slippery. If you must use wax, use non-skid floor wax.  Do not have throw rugs and other things on the floor that can make you trip. What can I do with my stairs?  Do not leave any items on the stairs.  Make sure that there are handrails on both sides of the stairs and use them. Fix handrails that are broken or loose. Make sure  that handrails are as long as the stairways.  Check any carpeting to make sure that it is firmly attached to the stairs. Fix any carpet that is loose or worn.  Avoid having throw rugs at the top or bottom of the stairs. If you do have throw rugs, attach them to the floor with carpet tape.  Make sure that you have a light switch at the top of the stairs and the bottom of the stairs. If you do not have them, ask someone to add them for you. What else can I do to help prevent falls?  Wear shoes that: ? Do not have high heels. ? Have rubber bottoms. ? Are comfortable and fit you  well. ? Are closed at the toe. Do not wear sandals.  If you use a stepladder: ? Make sure that it is fully opened. Do not climb a closed stepladder. ? Make sure that both sides of the stepladder are locked into place. ? Ask someone to hold it for you, if possible.  Clearly mark and make sure that you can see: ? Any grab bars or handrails. ? First and last steps. ? Where the edge of each step is.  Use tools that help you move around (mobility aids) if they are needed. These include: ? Canes. ? Walkers. ? Scooters. ? Crutches.  Turn on the lights when you go into a dark area. Replace any light bulbs as soon as they burn out.  Set up your furniture so you have a clear path. Avoid moving your furniture around.  If any of your floors are uneven, fix them.  If there are any pets around you, be aware of where they are.  Review your medicines with your doctor. Some medicines can make you feel dizzy. This can increase your chance of falling. Ask your doctor what other things that you can do to help prevent falls. This information is not intended to replace advice given to you by your health care provider. Make sure you discuss any questions you have with your health care provider. Document Released: 01/05/2009 Document Revised: 08/17/2015 Document Reviewed: 04/15/2014 Elsevier Interactive Patient Education   [employer].

## 2017-04-01 NOTE — Progress Notes (Signed)
Subjective: CC: fall PCP: Chipper Herb, MD [name] is a 84 y.o. male presenting to clinic today for:  1. Fall/ Rib pain Patient sustained a mechanical fall on 03/27/2016.  He had fallen onto his left side and hit the left side of his head at that time.  He had a CT scan of his head which was negative for any acute bleeds or fractures.  Today, he follows up noting that he had rib pain yesterday but it has since resolved.  Denies shortness of breath, hemoptysis, pleuritic chest pain, bruising or palpable abnormalities along the rib cage.  He is used his daily aspirin and Tylenol which has helped.  He denies pain elsewhere.  Headache has resolved from initial head injury.  He still has a palpable knot along the left side of his head.  Denies blurry vision, weakness, numbness tingling, confusion.   ROS: Per HPI  No Known Allergies Past Medical History:  Diagnosis Date  . Anemia    Iron deficiency  . Aortic stenosis    s/p pericardial AVR  . Arthritis   . Benign prostatic hypertrophy   . Coronary artery disease    s/p CABG 08/2010    . Diabetes mellitus   . Hyperlipidemia     Current Outpatient Medications:  .  ACCU-CHEK SOFTCLIX LANCETS lancets, Use as instructed, Disp: 100 each, Rfl: 12 .  aspirin 81 MG tablet, Take 81 mg by mouth daily.  , Disp: , Rfl:  .  atorvastatin (LIPITOR) 80 MG tablet, TAKE 1 TABLET (80 MG TOTAL) BY MOUTH DAILY., Disp: 90 tablet, Rfl: 1 .  escitalopram (LEXAPRO) 20 MG tablet, Take 1 tablet (20 mg total) by mouth daily., Disp: 30 tablet, Rfl: 3 .  FEFUM-FEPOLY-FA-B CMP-C-BIOT PO, Take by mouth daily., Disp: , Rfl:  .  glucose blood (ACCU-CHEK AVIVA) test strip, Test BS QD and PRN, Disp: 100 each, Rfl: 11 .  lisinopril (PRINIVIL,ZESTRIL) 10 MG tablet, Take 1 tablet (10 mg total) by mouth daily., Disp: 30 tablet, Rfl: 11 .  metFORMIN (GLUCOPHAGE) 500 MG tablet, TAKE 1 TABLET (500 MG TOTAL) BY MOUTH 2 (TWO) TIMES DAILY., Disp: 180 tablet, Rfl: 1 .   metoprolol succinate (TOPROL-XL) 25 MG 24 hr tablet, Take 1 tablet (25 mg total) by mouth daily., Disp: 30 tablet, Rfl: 11 .  nitroGLYCERIN (NITROSTAT) 0.4 MG SL tablet, Place 1 tablet (0.4 mg total) under the tongue every 5 (five) minutes x 3 doses as needed for chest pain., Disp: 25 tablet, Rfl: prn Social History   Socioeconomic History  . Marital status: Married    Spouse name: Not on file  . Number of children: Not on file  . Years of education: Not on file  . Highest education level: Not on file  Social Needs  . Financial resource strain: Not on file  . Food insecurity - worry: Not on file  . Food insecurity - inability: Not on file  . Transportation needs - medical: Not on file  . Transportation needs - non-medical: Not on file  Occupational History  . Not on file  Tobacco Use  . Smoking status: Former Smoker    Types: Cigars    Last attempt to quit: 09/17/1993    Years since quitting: 23.5  . Smokeless tobacco: Never Used  Substance and Sexual Activity  . Alcohol use: Yes    Comment: occassional  . Drug use: No  . Sexual activity: Yes  Other Topics Concern  . Not on file  Social History Narrative  . Not on file   Family History  Problem Relation Age of Onset  . Cancer Mother   . Diabetes Father   . Hypertension Other   . Diabetes Other     Objective: Office vital signs reviewed. BP (!) 150/77   Pulse 81   Temp 97.6 F (36.4 C) (Oral)   Ht 5\' 11"  (1.803 m)   Wt 188 lb (85.3 kg)   BMI 26.22 kg/m   Physical Examination:  General: Awake, alert, well nourished, No acute distress HEENT: grape sized knot along the temporal aspect of the left side of his head.  Associated healing ecchymosis present.    Eyes: PERRLA, extraocular membranes intact, sclera white Cardio: regular rate and rhythm, S1S2 heard, no murmurs appreciated Pulm: clear to auscultation bilaterally, no wheezes, rhonchi or rales; normal work of breathing on room air MSK: Ambulates  independently.  No tenderness to palpation along the left ribs anteriorly or posteriorly.  No palpable abnormalities in this region.  Assessment/ Plan: 84 y.o. male   1. Contusion of rib on left side, initial encounter Healing/healed contusion.  He is completely asymptomatic today.  No red flag signs or symptoms.  Can continue Tylenol, apply ice as needed if pain returns.  Home care instructions reviewed with the patient and a handout was provided.  2. Fall, subsequent encounter The bruise on his head is healing well.  We reviewed the results of his CT scan, which did not demonstrate any acute intracranial or bony abnormalities.  I am somewhat concerned that he lives alone.  I recommended that he follow-up with his PCP for further discussion regarding at home emergencies and advanced directives   Strict return precautions and reasons for emergent evaluation in the emergency department review with patient.  He voiced understanding and will follow-up as needed.   Janora Norlander, DO [name] [phone]

## 2017-04-16 ENCOUNTER — Encounter: Payer: Self-pay | Admitting: Family Medicine

## 2017-04-16 ENCOUNTER — Ambulatory Visit (INDEPENDENT_AMBULATORY_CARE_PROVIDER_SITE_OTHER): Payer: Medicare HMO | Admitting: Family Medicine

## 2017-04-16 VITALS — BP 138/78 | HR 81 | Temp 97.8°F | Ht 71.0 in | Wt 190.0 lb

## 2017-04-16 DIAGNOSIS — Z23 Encounter for immunization: Secondary | ICD-10-CM

## 2017-04-16 DIAGNOSIS — E1169 Type 2 diabetes mellitus with other specified complication: Secondary | ICD-10-CM | POA: Diagnosis not present

## 2017-04-16 DIAGNOSIS — E785 Hyperlipidemia, unspecified: Secondary | ICD-10-CM | POA: Diagnosis not present

## 2017-04-16 LAB — GLUCOSE HEMOCUE WAIVED: GLU HEMOCUE WAIVED: 137 mg/dL — AB (ref 65–99)

## 2017-04-16 LAB — BAYER DCA HB A1C WAIVED: HB A1C: 6 % (ref <7.0)

## 2017-04-16 NOTE — Addendum Note (Signed)
Addended byCarrolyn Leigh on: 04/16/2017 01:36 PM   Modules accepted: Orders

## 2017-04-16 NOTE — Progress Notes (Signed)
Subjective: CC: DM2 PCP: Chipper Herb, MD QMG:QQPYP R Faulconer is a 84 y.o. male presenting to clinic today for:  1. Type 2 Diabetes:  Taking medication(s): Metformin, Lipitor, Lisinopril   Last eye exam: >1 year ago Last foot exam: >1 year Last A1c: 5.9 in 2016. Nephropathy screen indicated?: on ACE-I Last flu, zoster and/or pneumovax: needs second PNA shot  ROS: denies fever, chills, dizziness, LOC, polyuria, polydipsia, unintended weight loss/gain, foot ulcerations, numbness or tingling in extremities or chest pain.  ROS: Per HPI  No Known Allergies Past Medical History:  Diagnosis Date  . Anemia    Iron deficiency  . Aortic stenosis    s/p pericardial AVR  . Arthritis   . Benign prostatic hypertrophy   . Coronary artery disease    s/p CABG 08/2010    . Diabetes mellitus   . Hyperlipidemia     Current Outpatient Medications:  .  ACCU-CHEK SOFTCLIX LANCETS lancets, Use as instructed, Disp: 100 each, Rfl: 12 .  aspirin 81 MG tablet, Take 81 mg by mouth daily.  , Disp: , Rfl:  .  atorvastatin (LIPITOR) 80 MG tablet, TAKE 1 TABLET (80 MG TOTAL) BY MOUTH DAILY., Disp: 90 tablet, Rfl: 1 .  escitalopram (LEXAPRO) 20 MG tablet, Take 1 tablet (20 mg total) by mouth daily., Disp: 30 tablet, Rfl: 3 .  FEFUM-FEPOLY-FA-B CMP-C-BIOT PO, Take by mouth daily., Disp: , Rfl:  .  glucose blood (ACCU-CHEK AVIVA) test strip, Test BS QD and PRN, Disp: 100 each, Rfl: 11 .  lisinopril (PRINIVIL,ZESTRIL) 10 MG tablet, Take 1 tablet (10 mg total) by mouth daily., Disp: 30 tablet, Rfl: 11 .  metFORMIN (GLUCOPHAGE) 500 MG tablet, TAKE 1 TABLET (500 MG TOTAL) BY MOUTH 2 (TWO) TIMES DAILY., Disp: 180 tablet, Rfl: 1 .  metoprolol succinate (TOPROL-XL) 25 MG 24 hr tablet, Take 1 tablet (25 mg total) by mouth daily., Disp: 30 tablet, Rfl: 11 .  nitroGLYCERIN (NITROSTAT) 0.4 MG SL tablet, Place 1 tablet (0.4 mg total) under the tongue every 5 (five) minutes x 3 doses as needed for chest pain., Disp:  25 tablet, Rfl: prn Social History   Socioeconomic History  . Marital status: Married    Spouse name: Not on file  . Number of children: Not on file  . Years of education: Not on file  . Highest education level: Not on file  Social Needs  . Financial resource strain: Not on file  . Food insecurity - worry: Not on file  . Food insecurity - inability: Not on file  . Transportation needs - medical: Not on file  . Transportation needs - non-medical: Not on file  Occupational History  . Not on file  Tobacco Use  . Smoking status: Former Smoker    Types: Cigars    Last attempt to quit: 09/17/1993    Years since quitting: 23.5  . Smokeless tobacco: Never Used  Substance and Sexual Activity  . Alcohol use: Yes    Comment: occassional  . Drug use: No  . Sexual activity: Yes  Other Topics Concern  . Not on file  Social History Narrative  . Not on file   Family History  Problem Relation Age of Onset  . Cancer Mother   . Diabetes Father   . Hypertension Other   . Diabetes Other     Objective: Office vital signs reviewed. BP 138/78   Pulse 81   Temp 97.8 F (36.6 C) (Oral)   Ht 5\' 11"  (  1.803 m)   Wt 190 lb (86.2 kg)   BMI 26.50 kg/m   Physical Examination:  General: Awake, alert, well nourished, No acute distress HEENT: sclera white, MMM Cardio: regular rate and rhythm, S1S2 heard, soft SEM appreciated at LSB Pulm: clear to auscultation bilaterally, no wheezes, rhonchi or rales; normal work of breathing on room air Extremities: warm, well perfused, trace pedal edema, no cyanosis or clubbing; +1 pulses bilaterally MSK: normal gait and normal station Skin: dry; intact; no rashes or lesions Neuro: monofilament exam as below; AOx2 (says it's 2020 for year)  Diabetic Foot Form - Detailed   Diabetic Foot Exam - detailed Diabetic Foot exam was performed with the following findings:  Yes 04/16/2017  1:22 PM  Visual Foot Exam completed.:  Yes  Can the patient see the bottom  of their feet?:  Yes Are the shoes appropriate in style and fit?:  Yes Is there swelling or and abnormal foot shape?:  Yes Is there a claw toe deformity?:  Yes Is there elevated skin temparature?:  No Is there foot or ankle muscle weakness?:  Yes Normal Range of Motion:  No Pulse Foot Exam completed.:  Yes  Right posterior Tibialias:  Diminished Left posterior Tibialias:  Diminished  Right Dorsalis Pedis:  Diminished Left Dorsalis Pedis:  Diminished  Semmes-Weinstein Monofilament Test R Site 1-Great Toe:  Pos L Site 1-Great Toe:  Pos       Assessment/ Plan: 84 y.o. male   1. Type 2 diabetes mellitus with hyperlipidemia (HCC) Last A1c was less than 6 over 2 years ago.  We will plan to recheck A1c.  Patient is on a statin, ACE inhibitor and metformin.  May be able to discontinue metformin if continues to be well controlled.  Will contact patient with results once this is available.  Additionally, second pneumonia shot administered today.  Monofilament exam performed today see above for details.  Check point-of-care glucose as well for alert and oriented x2.  Did not know the year.  This was also the case on his 03/28/2017 exam.  Unsure if this is his baseline. - Bayer DCA Hb A1c Waived - Glucose Hemocue Waived   Orders Placed This Encounter  Procedures  . Bayer DCA Hb A1c Waived  . Glucose Hemocue [place], [place] [phone]

## 2017-04-16 NOTE — Patient Instructions (Addendum)
You had labs performed today.  You will be contacted with the results of the labs once they are available, usually in the next 3 days for routine lab work.  Diabetes and Foot Care Diabetes may cause you to have problems because of poor blood supply (circulation) to your feet and legs. This may cause the skin on your feet to become thinner, break easier, and heal more slowly. Your skin may become dry, and the skin may peel and crack. You may also have nerve damage in your legs and feet causing decreased feeling in them. You may not notice minor injuries to your feet that could lead to infections or more serious problems. Taking care of your feet is one of the most important things you can do for yourself. Follow these instructions at home:  Wear shoes at all times, even in the house. Do not go barefoot. Bare feet are easily injured.  Check your feet daily for blisters, cuts, and redness. If you cannot see the bottom of your feet, use a mirror or ask someone for help.  Wash your feet with warm water (do not use hot water) and mild soap. Then pat your feet and the areas between your toes until they are completely dry. Do not soak your feet as this can dry your skin.  Apply a moisturizing lotion or petroleum jelly (that does not contain alcohol and is unscented) to the skin on your feet and to dry, brittle toenails. Do not apply lotion between your toes.  Trim your toenails straight across. Do not dig under them or around the cuticle. File the edges of your nails with an emery board or nail file.  Do not cut corns or calluses or try to remove them with medicine.  Wear clean socks or stockings every day. Make sure they are not too tight. Do not wear knee-high stockings since they may decrease blood flow to your legs.  Wear shoes that fit properly and have enough cushioning. To break in new shoes, wear them for just a few hours a day. This prevents you from injuring your feet. Always look in your shoes  before you put them on to be sure there are no objects inside.  Do not cross your legs. This may decrease the blood flow to your feet.  If you find a minor scrape, cut, or break in the skin on your feet, keep it and the skin around it clean and dry. These areas may be cleansed with mild soap and water. Do not cleanse the area with peroxide, alcohol, or iodine.  When you remove an adhesive bandage, be sure not to damage the skin around it.  If you have a wound, look at it several times a day to make sure it is healing.  Do not use heating pads or hot water bottles. They may burn your skin. If you have lost feeling in your feet or legs, you may not know it is happening until it is too late.  Make sure your health care provider performs a complete foot exam at least annually or more often if you have foot problems. Report any cuts, sores, or bruises to your health care provider immediately. Contact a health care provider if:  You have an injury that is not healing.  You have cuts or breaks in the skin.  You have an ingrown nail.  You notice redness on your legs or feet.  You feel burning or tingling in your legs or feet.  You  have pain or cramps in your legs and feet.  Your legs or feet are numb.  Your feet always feel cold. Get help right away if:  There is increasing redness, swelling, or pain in or around a wound.  There is a red line that goes up your leg.  Pus is coming from a wound.  You develop a fever or as directed by your health care provider.  You notice a bad smell coming from an ulcer or wound. This information is not intended to replace advice given to you by your health care provider. Make sure you discuss any questions you have with your health care provider. Document Released: 03/08/2000 Document Revised: 08/17/2015 Document Reviewed: 08/18/2012 Elsevier Interactive Patient Education  2017 [employer].

## 2017-05-02 ENCOUNTER — Ambulatory Visit: Payer: Medicare HMO | Admitting: Family Medicine

## 2017-07-09 ENCOUNTER — Ambulatory Visit (INDEPENDENT_AMBULATORY_CARE_PROVIDER_SITE_OTHER): Payer: Medicare HMO | Admitting: Family Medicine

## 2017-07-09 ENCOUNTER — Encounter: Payer: Self-pay | Admitting: Family Medicine

## 2017-07-09 ENCOUNTER — Ambulatory Visit (INDEPENDENT_AMBULATORY_CARE_PROVIDER_SITE_OTHER): Payer: Medicare HMO

## 2017-07-09 VITALS — BP 137/73 | HR 101 | Temp 98.7°F | Ht 71.0 in | Wt 191.0 lb

## 2017-07-09 DIAGNOSIS — T148XXA Other injury of unspecified body region, initial encounter: Secondary | ICD-10-CM

## 2017-07-09 DIAGNOSIS — S3992XA Unspecified injury of lower back, initial encounter: Secondary | ICD-10-CM | POA: Diagnosis not present

## 2017-07-09 DIAGNOSIS — W19XXXA Unspecified fall, initial encounter: Secondary | ICD-10-CM | POA: Diagnosis not present

## 2017-07-09 DIAGNOSIS — M545 Low back pain: Secondary | ICD-10-CM

## 2017-07-09 MED ORDER — METHYLPREDNISOLONE ACETATE 80 MG/ML IJ SUSP
80.0000 mg | Freq: Once | INTRAMUSCULAR | Status: AC
Start: 2017-07-09 — End: 2017-07-09
  Administered 2017-07-09: 80 mg via INTRAMUSCULAR

## 2017-07-09 NOTE — Addendum Note (Signed)
Addended byCarrolyn Leigh on: 07/09/2017 05:03 PM   Modules accepted: Orders

## 2017-07-09 NOTE — Patient Instructions (Signed)
You were given a dose of steroid to help with inflammation.  Apply ice to the painful area on the back.  Use Blu [place] or [place] to affected area to help with pain.   Hematoma A hematoma is a collection of blood under the skin, in an organ, in a body space, in a joint space, or in other tissue. The blood can thicken (clot) to form a lump that you can see and feel. The lump is often firm and may become sore and tender. Most hematomas get better in a few days to weeks. However, some hematomas may be serious and require medical care. Hematomas can range from very small to very large. What are the causes? This condition is caused by:  A blunt or penetrating injury.  A leakage from a blood vessel under the skin. This leak happens on its own (is spontaneous) and is more likely to occur in older people, especially those who take blood thinners.  Some medical procedures including surgeries, such as oral surgery, face lifts, and surgeries that involve the joints.  Some medical conditions that cause bleeding or bruising problems. There may be multiple hematomas that appear in different areas of the body.  What are the signs or symptoms? Symptoms of this condition can depend on where the hematoma is located. Common symptoms of a hematoma under the skin include:  A firm lump on the body.  Pain and tenderness in the area.  Bruising. Blue, dark blue, purple-red, or yellowish skin (discoloration) may appear at the site of the hematoma if the hematoma is close to the surface of the skin.  For hematomas in deeper tissues or body spaces, symptoms may be less obvious. A collection of blood in the stomach (intra-abdominal hematoma) may cause pain in the abdomen, weakness, fainting, and shortness of breath. A collection of blood in the head (intracranial hematoma) may cause a headache or symptoms such as weakness, trouble speaking or understanding, or a change in consciousness. How is this diagnosed? This  condition is diagnosed based on:  Your medical history.  A physical exam.  Imaging tests, such as an ultrasonogram or CT scan. These may be needed if your health care provider suspects a hematoma in deeper tissues or body spaces.  Blood tests. These may be needed if your health care provider believes that the hematoma is caused by a medical condition.  How is this treated? This condition usually does not need treatment because many hematomas go away on their own over time. However, large hematomas, or those that may affect vital organs, may need surgical drainage or monitoring. If the hematoma is caused by a medical condition, medicines may be prescribed. Follow these instructions at home: Managing pain, stiffness, and swelling  If directed, apply ice to the affected area: ? Put ice in a plastic bag. ? Place a towel between your skin and the bag. ? Leave the ice on for 20 minutes, 2-3 times a day for the first couple of days.  After applying ice for a couple of days, your health care provider may recommend that you apply warm compresses to the affected area instead. Do this as told by your health care provider. Remove the heat if your skin turns bright red. This is especially important if you are unable to feel pain, heat, or cold. You may have a greater risk of getting burned  Raise (elevate) the affected area above the level of your heart while you are sitting or lying down.  Wrap  the affected area with an elastic bandage, if told by your health care provider. The bandage applies pressure (compression) to the area, which may help to reduce swelling and help the hematoma heal. Make sure the bandage is not wrapped too tight.  If your hematoma is on a leg or foot (lower extremity) and is painful, your health care provider may recommend crutches. Use them as told by your health care provider. General instructions  Take over-the-counter and prescription medicines only as told by your health  care provider.  Keep all follow-up visits as told by your health care provider. This is important. Contact a health care provider if:  You have a fever.  The swelling or discoloration gets worse.  You develop more hematomas. Get help right away if:  Your pain is worse or your pain is not controlled with medicine.  Your skin over the hematoma breaks or starts bleeding.  Your hematoma is in your chest or abdomen and you have weakness, shortness of breath, or a change in consciousness.  You have a hematoma on your scalp caused by a fall or injury and you have a headache that gets worse, trouble speaking or understanding, weakness, or a change in alertness or consciousness. Summary  A hematoma is a collection of blood under the skin, in an organ, in a body space, in a joint space, or in other tissue.  This condition usually does not need treatment because many hematomas go away on their own over time.  Large hematomas, or those that may affect vital organs, may need surgical drainage or monitoring. If the hematoma is caused by a medical condition, medicines may be prescribed. This information is not intended to replace advice given to you by your health care provider. Make sure you discuss any questions you have with your health care provider. Document Released: 10/24/2003 Document Revised: 04/13/2016 Document Reviewed: 04/13/2016 Elsevier Interactive Patient Education  2018 [employer].

## 2017-07-09 NOTE — Progress Notes (Signed)
Subjective: CC: Back pain PCP: Chipper Herb, MD QQI:WLNLG [name] is a 84 y.o. male presenting to clinic today for:  1. Back pain Patient reports that he was weed eating yesterday when he tripped over on something and fell back on his left low back and hit a protruding drain pipe.  He notes pain following incident.  He took aspirin and unknown pain pills.  He denies LE numbness/ tingling, urinary retention, fecal incontinence.  Pain is located in his left low back.  It was severe upon arriving but has eased off.   ROS: Per HPI  No Known Allergies Past Medical History:  Diagnosis Date  . Anemia    Iron deficiency  . Aortic stenosis    s/p pericardial AVR  . Arthritis   . Benign prostatic hypertrophy   . Coronary artery disease    s/p CABG 08/2010    . Diabetes mellitus   . Hyperlipidemia     Current Outpatient Medications:  .  ACCU-CHEK SOFTCLIX LANCETS lancets, Use as instructed, Disp: 100 each, Rfl: 12 .  aspirin 81 MG tablet, Take 81 mg by mouth daily.  , Disp: , Rfl:  .  atorvastatin (LIPITOR) 80 MG tablet, TAKE 1 TABLET (80 MG TOTAL) BY MOUTH DAILY., Disp: 90 tablet, Rfl: 1 .  escitalopram (LEXAPRO) 20 MG tablet, Take 1 tablet (20 mg total) by mouth daily., Disp: 30 tablet, Rfl: 3 .  FEFUM-FEPOLY-FA-B CMP-C-BIOT PO, Take by mouth daily., Disp: , Rfl:  .  glucose blood (ACCU-CHEK AVIVA) test strip, Test BS QD and PRN, Disp: 100 each, Rfl: 11 .  lisinopril (PRINIVIL,ZESTRIL) 10 MG tablet, Take 1 tablet (10 mg total) by mouth daily., Disp: 30 tablet, Rfl: 11 .  metFORMIN (GLUCOPHAGE) 500 MG tablet, TAKE 1 TABLET (500 MG TOTAL) BY MOUTH 2 (TWO) TIMES DAILY., Disp: 180 tablet, Rfl: 1 .  metoprolol succinate (TOPROL-XL) 25 MG 24 hr tablet, Take 1 tablet (25 mg total) by mouth daily., Disp: 30 tablet, Rfl: 11 .  nitroGLYCERIN (NITROSTAT) 0.4 MG SL tablet, Place 1 tablet (0.4 mg total) under the tongue every 5 (five) minutes x 3 doses as needed for chest pain., Disp: 25 tablet,  Rfl: prn Social History   Socioeconomic History  . Marital status: Married    Spouse name: Not on file  . Number of children: Not on file  . Years of education: Not on file  . Highest education level: Not on file  Occupational History  . Not on file  Social Needs  . Financial resource strain: Not on file  . Food insecurity:    Worry: Not on file    Inability: Not on file  . Transportation needs:    Medical: Not on file    Non-medical: Not on file  Tobacco Use  . Smoking status: Former Smoker    Types: Cigars    Last attempt to quit: 09/17/1993    Years since quitting: 23.8  . Smokeless tobacco: Never Used  Substance and Sexual Activity  . Alcohol use: Yes    Comment: occassional  . Drug use: No  . Sexual activity: Yes  Lifestyle  . Physical activity:    Days per week: Not on file    Minutes per session: Not on file  . Stress: Not on file  Relationships  . Social connections:    Talks on phone: Not on file    Gets together: Not on file    Attends religious service: Not on file  Active member of club or organization: Not on file    Attends meetings of clubs or organizations: Not on file    Relationship status: Not on file  . Intimate partner violence:    Fear of current or ex partner: Not on file    Emotionally abused: Not on file    Physically abused: Not on file    Forced sexual activity: Not on file  Other Topics Concern  . Not on file  Social History Narrative  . Not on file   Family History  Problem Relation Age of Onset  . Cancer Mother   . Diabetes Father   . Hypertension Other   . Diabetes Other     Objective: Office vital signs reviewed. BP 137/73   Pulse (!) 101   Temp 98.7 F (37.1 C) (Oral)   Ht 5\' 11"  (1.803 m)   Wt 191 lb (86.6 kg)   BMI 26.64 kg/m   Physical Examination:  General: Awake, alert, elderly male, No acute distress Extremities: warm, well perfused, No edema, cyanosis or clubbing; +2 pulses bilaterally MSK:  antalgic/shuffling gait   Lumbar spine: No midline tenderness to palpation.  He has a notable scoliotic curve.  He does have left-sided paraspinal muscle tenderness palpation and tenderness to palpation over the lumbosacral junction.  There is a large horizontal bruise with what appears to be a hematoma along the left lower aspect of the back.  He is especially tender over this area.  There is also an associated excoriation that is hemostatic. Skin: As above Neuro: Light touch sensation grossly intact.  Dg Lumbar Spine 2-3 Views  Result Date: 07/09/2017 CLINICAL DATA:  84 year old male status post fall into ditch and struck drainage bite. Left side back pain. EXAM: LUMBAR SPINE - 2-3 VIEW COMPARISON:  No prior lumbar spine imaging. Chest radiographs 01/13/2015. FINDINGS: Normal lumbar segmentation. There is mild dextroconvex lumbar scoliosis associated with straightening of lordosis. There is diffuse bulky lumbar degenerative endplate spurring. Multilevel disc space loss, moderate to severe at L2-L3 and L5-S1. Grossly intact visible sacrum. The visible lower thoracic levels appears stable including chronic interbody ankylosis in the lower thoracic spine. Calcified aortic atherosclerosis. Negative visible bowel gas pattern. IMPRESSION: 1. No acute osseous abnormality identified in the lumbar spine. Advanced widespread lumbar spine degeneration. 2. Chronic lower thoracic spine ankylosis. 3.  Aortic Atherosclerosis (ICD10-I70.0). Electronically Signed   By: Genevie Ann M.D.   On: 07/09/2017 16:24    Assessment/ Plan: 84 y.o. male   1. Acute low back pain, unspecified back pain laterality, with sciatica presence unspecified Personal review of x-ray did not reveal any acute fractures.  Degenerative changes noted.  There is osteophytes appreciated.  Radiologist review of x-ray notes no acute fractures.  However he does have widespread degenerative changes and chronic lower thoracic's fine ankylosis. - DG Lumbar  Spine 2-3 Views; Future  2. Fall, initial encounter Mechanical.  Avoid sedating medications.  He was given a dose of Depo-Medrol here in office to help with pain and inflammation.  Apply ice to affected area.  Use topical analgesic of choice. - DG Lumbar Spine 2-3 Views; Future  3. Hematoma   Orders Placed This Encounter  Procedures  . DG Lumbar Spine 2-3 Views    Standing Status:   Future    Number of Occurrences:   1    Standing Expiration Date:   09/08/2018    Order Specific Question:   Reason for Exam (SYMPTOM  OR DIAGNOSIS REQUIRED)  Answer:   back pain    Order Specific Question:   Preferred imaging location?    Answer:   Internal      Janora Norlander, [place] [phone]

## 2017-07-15 ENCOUNTER — Ambulatory Visit: Payer: Medicare HMO | Admitting: Family Medicine

## 2017-07-16 ENCOUNTER — Other Ambulatory Visit: Payer: Self-pay | Admitting: Family Medicine

## 2017-07-21 ENCOUNTER — Encounter: Payer: Self-pay | Admitting: Family Medicine

## 2017-07-25 ENCOUNTER — Encounter: Payer: Self-pay | Admitting: Cardiology

## 2017-08-06 ENCOUNTER — Ambulatory Visit (HOSPITAL_COMMUNITY): Payer: Medicare HMO | Attending: Cardiology

## 2017-08-07 ENCOUNTER — Ambulatory Visit (INDEPENDENT_AMBULATORY_CARE_PROVIDER_SITE_OTHER): Payer: Medicare HMO | Admitting: Family Medicine

## 2017-08-07 ENCOUNTER — Encounter: Payer: Self-pay | Admitting: Family Medicine

## 2017-08-07 VITALS — BP 123/64 | HR 66 | Temp 97.3°F | Ht 71.0 in | Wt 189.0 lb

## 2017-08-07 DIAGNOSIS — E785 Hyperlipidemia, unspecified: Secondary | ICD-10-CM

## 2017-08-07 DIAGNOSIS — M545 Low back pain: Secondary | ICD-10-CM

## 2017-08-07 DIAGNOSIS — S20212A Contusion of left front wall of thorax, initial encounter: Secondary | ICD-10-CM

## 2017-08-07 DIAGNOSIS — E7800 Pure hypercholesterolemia, unspecified: Secondary | ICD-10-CM

## 2017-08-07 DIAGNOSIS — N400 Enlarged prostate without lower urinary tract symptoms: Secondary | ICD-10-CM

## 2017-08-07 DIAGNOSIS — E119 Type 2 diabetes mellitus without complications: Secondary | ICD-10-CM

## 2017-08-07 DIAGNOSIS — Z0000 Encounter for general adult medical examination without abnormal findings: Secondary | ICD-10-CM | POA: Diagnosis not present

## 2017-08-07 DIAGNOSIS — I10 Essential (primary) hypertension: Secondary | ICD-10-CM | POA: Diagnosis not present

## 2017-08-07 DIAGNOSIS — E1151 Type 2 diabetes mellitus with diabetic peripheral angiopathy without gangrene: Secondary | ICD-10-CM

## 2017-08-07 DIAGNOSIS — I739 Peripheral vascular disease, unspecified: Secondary | ICD-10-CM | POA: Diagnosis not present

## 2017-08-07 DIAGNOSIS — T148XXA Other injury of unspecified body region, initial encounter: Secondary | ICD-10-CM | POA: Diagnosis not present

## 2017-08-07 DIAGNOSIS — E559 Vitamin D deficiency, unspecified: Secondary | ICD-10-CM

## 2017-08-07 DIAGNOSIS — E1169 Type 2 diabetes mellitus with other specified complication: Secondary | ICD-10-CM | POA: Diagnosis not present

## 2017-08-07 LAB — BAYER DCA HB A1C WAIVED: HB A1C (BAYER DCA - WAIVED): 5.8 % (ref <7.0)

## 2017-08-07 MED ORDER — FUROSEMIDE 20 MG PO TABS: ORAL_TABLET | ORAL | 0 refills | Status: DC

## 2017-08-07 NOTE — Patient Instructions (Addendum)
Medicare Annual Wellness Visit  [facility] and the medical providers at [facility] strive to bring you the best medical care.  In doing so we not only want to address your current medical conditions and concerns but also to detect new conditions early and prevent illness, disease and health-related problems.    Medicare offers a yearly Wellness Visit which allows our clinical staff to assess your need for preventative services including immunizations, lifestyle education, counseling to decrease risk of preventable diseases and screening for fall risk and other medical concerns.    This visit is provided free of charge (no copay) for all Medicare recipients. The clinical pharmacists at [facility] have begun to conduct these Wellness Visits which will also include a thorough review of all your medications.    As you primary medical provider recommend that you make an appointment for your Annual Wellness Visit if you have not done so already this year.  You may set up this appointment before you leave today or you may call back ([phone]) and schedule an appointment.  Please make sure when you call that you mention that you are scheduling your Annual Wellness Visit with the clinical pharmacist so that the appointment may be made for the proper length of time.     Continue current medications. Continue good therapeutic lifestyle changes which include good diet and exercise. Fall precautions discussed with patient. If an FOBT was given today- please return it to our front desk. If you are over 41 years old - you may need Prevnar 90 or the adult Pneumonia vaccine.  **Flu shots are available--- please call and schedule a FLU-CLINIC appointment**  After your visit with [place] today you will receive a survey in the mail or online from [employer] regarding your care with [place]. Please take a moment to fill this out. Your feedback is very  important to [place] as you can help [place] better understand your patient needs as well as improve your experience and satisfaction. WE CARE ABOUT YOU!!!   Check blood sugars more regularly especially fasting and before meals Wear support hose the should be placed on the lower extremities with first arising in the morning Keep appointment with the cardiologist Take fluid pill as directed 20 mg daily on Monday Wednesday and Friday for 6 doses.  Then come back and repeat weight so that we can check edema and get BMP at that time also.

## 2017-08-07 NOTE — Addendum Note (Signed)
Addended by: Zannie Cove on: 08/07/2017 11:47 AM   Modules accepted: Orders

## 2017-08-07 NOTE — Progress Notes (Signed)
Subjective:    Patient ID: [name], male    DOB: [birth year], 84 y.o.   MRN: [id number]  HPI  Pt here for follow up and management of chronic medical problems which includes diabetes and hyperlipidemia. He is taking medication regularly.  Udell Ray is doing well overall.  He is due to get a rectal exam today return in FOBT card and get lab work.  He has no complaints and does not need any refills.  His vital signs are stable and his weight is down a couple pounds with good blood pressure readings.  This patient is on an antidepressant and does take iron as well as medication for his blood sugar and blood pressure.  He has had aortic valve replacement.  He also has coronary artery disease.  He has iron deficiency anemia.  The patient today does have an upcoming appointment soon with the cardiologist for follow-up.  He thinks it is next week with Dr. Percival Spanish.  He denies any chest pain pressure tightness or shortness of breath.  He denies any trouble with swallowing heartburn indigestion nausea vomiting diarrhea blood in the stool black tarry bowel movements or change in bowel habits.  He is passing his water without problems.  He recently was weed eating and fell in a hole and sustained a major bruise to his back but is doing better with this.  This happened a couple of weeks ago.  He does continue to take the medications as reviewed and says he is still taking Lexapro.  He has had an eye exam about 8 months ago and had cataracts removed and he is doing much better with his vision.   Patient Active Problem List   Diagnosis Date Noted  . Palpitations 08/21/2016  . Essential hypertension 08/21/2016  . Carotid bruit 08/21/2016  . H/O aortic valve replacement 08/21/2016  . Anemia, iron deficiency 04/11/2014  . Orthostatic hypotension 06/22/2012  . Dizzy 06/22/2012  . Precordial pain 09/18/2011  . Unstable angina ([place]) 07/24/2011  . CAD (coronary artery disease) 01/30/2011  . COLONIC POLYPS  06/23/2008  . Type 2 diabetes mellitus with hyperlipidemia ([place]) 06/23/2008  . Hyperlipidemia LDL goal <70 06/23/2008  . Aortic valve disorder 06/23/2008  . BENIGN PROSTATIC HYPERTROPHY, HX OF 06/23/2008   Outpatient Encounter Medications as of 08/07/2017  Medication Sig  . ACCU-CHEK SOFTCLIX LANCETS lancets Use as instructed  . aspirin 81 MG tablet Take 81 mg by mouth daily.    Marland Kitchen atorvastatin (LIPITOR) 80 MG tablet TAKE 1 TABLET (80 MG TOTAL) BY MOUTH DAILY.  Marland Kitchen escitalopram (LEXAPRO) 20 MG tablet Take 1 tablet (20 mg total) by mouth daily.  Marland Kitchen FEFUM-FEPOLY-FA-B CMP-C-BIOT PO Take by mouth daily.  Marland Kitchen glucose blood (ACCU-CHEK AVIVA) test strip Test BS QD and PRN  . lisinopril (PRINIVIL,ZESTRIL) 10 MG tablet Take 1 tablet (10 mg total) by mouth daily.  . metFORMIN (GLUCOPHAGE) 500 MG tablet TAKE 1 TABLET (500 MG TOTAL) BY MOUTH 2 (TWO) TIMES DAILY.  . metoprolol succinate (TOPROL-XL) 25 MG 24 hr tablet Take 1 tablet (25 mg total) by mouth daily.  . nitroGLYCERIN (NITROSTAT) 0.4 MG SL tablet Place 1 tablet (0.4 mg total) under the tongue every 5 (five) minutes x 3 doses as needed for chest pain.   No facility-administered encounter medications on file as of 08/07/2017.      Review of Systems  Constitutional: Negative.   HENT: Negative.   Eyes: Negative.   Respiratory: Negative.   Cardiovascular: Negative.  Gastrointestinal: Negative.   Endocrine: Negative.   Genitourinary: Negative.   Musculoskeletal: Negative.   Skin: Negative.   Allergic/Immunologic: Negative.   Neurological: Negative.   Hematological: Negative.   Psychiatric/Behavioral: Negative.        Objective:   Physical Exam  Constitutional: He is oriented to person, place, and time. He appears well-developed and well-nourished. No distress.  The patient is still somewhat emotional following the death of his wife and he is taking his Lexapro.  HENT:  Head: Normocephalic and atraumatic.  Right Ear: External ear normal.    Nose: Nose normal.  Mouth/Throat: Oropharynx is clear and moist. No oropharyngeal exudate.  Left ear cerumen  Eyes: Pupils are equal, round, and reactive to light. Conjunctivae and EOM are normal. Right eye exhibits no discharge. Left eye exhibits no discharge.  Up-to-date on eye exams with recent cataract surgery  Neck: Normal range of motion. Neck supple. No thyromegaly present.  Bilateral carotid bruits most likely secondary to valve replacement  Cardiovascular: Normal rate and regular rhythm.  Murmur heard. Heart is regular at 72/min with a grade 3/6 systolic ejection murmur.  Diminished pulses right lower foot  Pulmonary/Chest: Effort normal and breath sounds normal. He has no wheezes. He has no rales. He exhibits no tenderness.  Clear anteriorly and posteriorly  Abdominal: Soft. Bowel sounds are normal. He exhibits no mass. There is no tenderness. There is no rebound.  No abdominal tenderness masses or bruits  Genitourinary: Rectum normal and penis normal.  Genitourinary Comments: Prostate was minimally enlarged with no lumps or masses.  No rectal masses.  External genitalia were within normal limits with no inguinal hernias being found.  Musculoskeletal: Normal range of motion. He exhibits edema and deformity. He exhibits no tenderness.  Patient is stiff following the fall and having a hematoma of his flank.  The hematoma is resolving slowly.  Involves the left flank.  There is bruising.  There is bilateral pedal and pretibial edema.  Lymphadenopathy:    He has no cervical adenopathy.  Neurological: He is alert and oriented to person, place, and time. He has normal reflexes. No cranial nerve deficit.  Skin: Skin is warm and dry. No rash noted.  Psychiatric: He has a normal mood and affect. His behavior is normal. Judgment and thought content normal.  Patient remains somewhat depressed but has normal behavior and thought content  Nursing note and vitals reviewed.   BP 123/64 (BP  Location: Left Arm)   Pulse 66   Temp (!) 97.3 F (36.3 C) (Oral)   Ht '5\' 11"'  (1.803 m)   Wt 189 lb (85.7 kg)   BMI 26.36 kg/m        Assessment & Plan:  1. Type 2 diabetes mellitus without complication, without long-term current use of insulin (HCC) -Needs to check blood sugars at home more regularly - CBC with Differential/Platelet - BMP8+EGFR - Bayer DCA Hb A1c Waived - Microalbumin / creatinine urine ratio  2. Vitamin D deficiency -Continue current treatment pending results of lab work - CBC with Differential/Platelet - VITAMIN D 25 Hydroxy (Vit-D Deficiency, Fractures)  3. Benign prostatic hyperplasia, unspecified whether lower urinary tract symptoms present -Prostate was slightly enlarged but there were no masses and no rectal masses and external genitalia were within normal limits - CBC with Differential/Platelet - PSA, total and free  4. Pure hypercholesterolemia -Continue current treatment pending results of lab work - CBC with Differential/Platelet - Lipid panel - Hepatic function panel  5. Health care maintenance -  Thyroid Panel With TSH  6. Hematoma -The left flank hematoma appears to be resolving but still quite large.  7. Acute low back pain, unspecified back pain laterality, with sciatica presence unspecified -The patient is having stiffness with laying down sitting up and with arising from the tabletop.  8. Contusion of rib on left side, initial encounter -This is improving.  9. Type II diabetes mellitus with peripheral circulatory disorder (HCC) -Continue current treatment pending results of lab work  10. Peripheral vascular insufficiency (HCC) -Diminished pulses right lower extremity  11. Essential hypertension -Blood pressure is good he will continue with current treatment   12. Type 2 diabetes mellitus with hyperlipidemia (HCC) -Continue with current treatment and as aggressive therapeutic lifestyle changes as possible including diet and  exercise  13.  Edema -Take Lasix 20 mg 1 on Monday Wednesday and Friday for 6 doses.  Return to clinic in 2 weeks to recheck weight and get BMP  Patient Instructions                       Medicare Annual Wellness Visit  [facility] and the medical providers at [facility] strive to bring you the best medical care.  In doing so we not only want to address your current medical conditions and concerns but also to detect new conditions early and prevent illness, disease and health-related problems.    Medicare offers a yearly Wellness Visit which allows our clinical staff to assess your need for preventative services including immunizations, lifestyle education, counseling to decrease risk of preventable diseases and screening for fall risk and other medical concerns.    This visit is provided free of charge (no copay) for all Medicare recipients. The clinical pharmacists at [facility] have begun to conduct these Wellness Visits which will also include a thorough review of all your medications.    As you primary medical provider recommend that you make an appointment for your Annual Wellness Visit if you have not done so already this year.  You may set up this appointment before you leave today or you may call back ([phone]) and schedule an appointment.  Please make sure when you call that you mention that you are scheduling your Annual Wellness Visit with the clinical pharmacist so that the appointment may be made for the proper length of time.     Continue current medications. Continue good therapeutic lifestyle changes which include good diet and exercise. Fall precautions discussed with patient. If an FOBT was given today- please return it to our front desk. If you are over 65 years old - you may need Prevnar 36 or the adult Pneumonia vaccine.  **Flu shots are available--- please call and schedule a FLU-CLINIC appointment**  After your visit  with [place] today you will receive a survey in the mail or online from [employer] regarding your care with [place]. Please take a moment to fill this out. Your feedback is very important to [place] as you can help [place] better understand your patient needs as well as improve your experience and satisfaction. WE CARE ABOUT YOU!!!   Check blood sugars more regularly especially fasting and before meals Wear support hose the should be placed on the lower extremities with first arising in the morning Keep appointment with the cardiologist Take fluid pill as directed 20 mg daily for 1 week and then come back and repeat weight so that we can check edema and get BMP at  that time also.  Arrie Senate MD

## 2017-08-08 LAB — HEPATIC FUNCTION PANEL
ALT: 15 IU/L (ref 0–44)
AST: 22 IU/L (ref 0–40)
Albumin: 4.3 g/dL (ref 3.5–4.7)
Alkaline Phosphatase: 122 IU/L — ABNORMAL HIGH (ref 39–117)
Bilirubin Total: 0.6 mg/dL (ref 0.0–1.2)
Bilirubin, Direct: 0.18 mg/dL (ref 0.00–0.40)
Total Protein: 7.1 g/dL (ref 6.0–8.5)

## 2017-08-08 LAB — THYROID PANEL WITH TSH
FREE THYROXINE INDEX: 1.5 (ref 1.2–4.9)
T3 Uptake Ratio: 28 % (ref 24–39)
T4 TOTAL: 5.4 ug/dL (ref 4.5–12.0)
TSH: 3.5 u[IU]/mL (ref 0.450–4.500)

## 2017-08-08 LAB — LIPID PANEL
CHOL/HDL RATIO: 3.5 ratio (ref 0.0–5.0)
Cholesterol, Total: 198 mg/dL (ref 100–199)
HDL: 56 mg/dL (ref >39)
LDL Calculated: 124 mg/dL — ABNORMAL HIGH (ref 0–99)
TRIGLYCERIDES: 89 mg/dL (ref 0–149)
VLDL Cholesterol Cal: 18 mg/dL (ref 5–40)

## 2017-08-08 LAB — CBC WITH DIFFERENTIAL/PLATELET
Basophils Absolute: 0.1 10*3/uL (ref 0.0–0.2)
Basos: 1 %
EOS (ABSOLUTE): 0.2 10*3/uL (ref 0.0–0.4)
EOS: 2 %
HEMATOCRIT: 32 % — AB (ref 37.5–51.0)
HEMOGLOBIN: 10 g/dL — AB (ref 13.0–17.7)
IMMATURE GRANS (ABS): 0 10*3/uL (ref 0.0–0.1)
Immature Granulocytes: 0 %
LYMPHS ABS: 1.6 10*3/uL (ref 0.7–3.1)
Lymphs: 25 %
MCH: 25.3 pg — ABNORMAL LOW (ref 26.6–33.0)
MCHC: 31.3 g/dL — AB (ref 31.5–35.7)
MCV: 81 fL (ref 79–97)
MONOCYTES: 7 %
Monocytes Absolute: 0.5 10*3/uL (ref 0.1–0.9)
NEUTROS ABS: 4.2 10*3/uL (ref 1.4–7.0)
Neutrophils: 65 %
Platelets: 243 10*3/uL (ref 150–379)
RBC: 3.96 x10E6/uL — ABNORMAL LOW (ref 4.14–5.80)
RDW: 15 % (ref 12.3–15.4)
WBC: 6.5 10*3/uL (ref 3.4–10.8)

## 2017-08-08 LAB — BMP8+EGFR
BUN/Creatinine Ratio: 10 (ref 10–24)
BUN: 11 mg/dL (ref 8–27)
CHLORIDE: 103 mmol/L (ref 96–106)
CO2: 23 mmol/L (ref 20–29)
Calcium: 9.2 mg/dL (ref 8.6–10.2)
Creatinine, Ser: 1.08 mg/dL (ref 0.76–1.27)
GFR calc Af Amer: 74 mL/min/{1.73_m2} (ref >59)
GFR, EST NON AFRICAN AMERICAN: 64 mL/min/{1.73_m2} (ref >59)
Glucose: 108 mg/dL — ABNORMAL HIGH (ref 65–99)
POTASSIUM: 4.3 mmol/L (ref 3.5–5.2)
Sodium: 140 mmol/L (ref 134–144)

## 2017-08-08 LAB — PSA, TOTAL AND FREE
PSA, Free Pct: 36.7 %
PSA, Free: 0.11 ng/mL
Prostate Specific Ag, Serum: 0.3 ng/mL (ref 0.0–4.0)

## 2017-08-08 LAB — MICROALBUMIN / CREATININE URINE RATIO
CREATININE, UR: 87.2 mg/dL
MICROALB/CREAT RATIO: 8 mg/g{creat} (ref 0.0–30.0)
MICROALBUM., U, RANDOM: 7 ug/mL

## 2017-08-08 LAB — VITAMIN D 25 HYDROXY (VIT D DEFICIENCY, FRACTURES): VIT D 25 HYDROXY: 26.9 ng/mL — AB (ref 30.0–100.0)

## 2017-08-20 ENCOUNTER — Telehealth: Payer: Self-pay | Admitting: Family Medicine

## 2017-08-20 DIAGNOSIS — I359 Nonrheumatic aortic valve disorder, unspecified: Secondary | ICD-10-CM

## 2017-08-20 DIAGNOSIS — I2510 Atherosclerotic heart disease of native coronary artery without angina pectoris: Secondary | ICD-10-CM

## 2017-08-20 DIAGNOSIS — E119 Type 2 diabetes mellitus without complications: Secondary | ICD-10-CM

## 2017-08-20 DIAGNOSIS — I10 Essential (primary) hypertension: Secondary | ICD-10-CM

## 2017-08-20 DIAGNOSIS — E7800 Pure hypercholesterolemia, unspecified: Secondary | ICD-10-CM

## 2017-08-20 NOTE — Telephone Encounter (Signed)
Needs referral to cardio - has [employer] - referral placed  (NA for pt )

## 2017-08-22 ENCOUNTER — Ambulatory Visit (INDEPENDENT_AMBULATORY_CARE_PROVIDER_SITE_OTHER): Payer: Medicare HMO | Admitting: Family Medicine

## 2017-08-22 ENCOUNTER — Encounter: Payer: Self-pay | Admitting: Family Medicine

## 2017-08-22 VITALS — BP 121/62 | HR 70 | Temp 97.0°F | Ht 71.0 in | Wt 190.0 lb

## 2017-08-22 DIAGNOSIS — R609 Edema, unspecified: Secondary | ICD-10-CM

## 2017-08-22 MED ORDER — FUROSEMIDE 20 MG PO TABS: ORAL_TABLET | ORAL | 3 refills | Status: DC

## 2017-08-22 NOTE — Patient Instructions (Signed)
Take Lasix 1 every morning on Monday Wednesday and Friday only We will arrange for the patient to have an appointment with the cardiologist for follow-up The patient is encouraged to use his support hose every day and to put them on the first thing when he gets up in the morning He should continue to check his blood sugars regularly He should come back to the office in 4 weeks and we will recheck his edema and get another BMP at that time

## 2017-08-22 NOTE — Progress Notes (Signed)
Subjective:    Patient ID: [name], male    DOB: 26-Nov-1932, 84 y.o.   MRN: [id number]  HPI Patient here today for 2 week follow up on edema.  The patient is still somewhat tearful regarding the loss of his wife and how much that she kept up with in his life.  He says he is breathing is some better but he only had 6 doses of Lasix to take.  He is also been eating a lot of popcorn almost every day.  We scolded him about this and he says he understands not to eat any more popcorn because of the sodium and the popcorn.  He denies any chest pain but does say that his breathing is somewhat better he is still having some edema in his feet he does have support hose but he did not put them on today.  We did encourage him to use his support hose and to put these on the first thing with arising every morning and he promises to stop eating the popcorn.  He denies any trouble with his stomach including nausea vomiting diarrhea or blood in the stool and denies any problems with passing his water.  He still complains with earwax in the right ear canal and we will irrigate this and remove this today.    Patient Active Problem List   Diagnosis Date Noted  . Palpitations 08/21/2016  . Essential hypertension 08/21/2016  . Carotid bruit 08/21/2016  . H/O aortic valve replacement 08/21/2016  . Anemia, iron deficiency 04/11/2014  . Orthostatic hypotension 06/22/2012  . Dizzy 06/22/2012  . Precordial pain 09/18/2011  . Unstable angina ([place]) 07/24/2011  . CAD (coronary artery disease) 01/30/2011  . COLONIC POLYPS 06/23/2008  . Type 2 diabetes mellitus with hyperlipidemia ([place]) 06/23/2008  . Hyperlipidemia LDL goal <70 06/23/2008  . Aortic valve disorder 06/23/2008  . BENIGN PROSTATIC HYPERTROPHY, HX OF 06/23/2008   Outpatient Encounter Medications as of 08/22/2017  Medication Sig  . ACCU-CHEK SOFTCLIX LANCETS lancets Use as instructed  . aspirin 81 MG tablet Take 81 mg by mouth daily.    Marland Kitchen atorvastatin  (LIPITOR) 80 MG tablet TAKE 1 TABLET (80 MG TOTAL) BY MOUTH DAILY.  Marland Kitchen escitalopram (LEXAPRO) 20 MG tablet Take 1 tablet (20 mg total) by mouth daily.  Marland Kitchen FEFUM-FEPOLY-FA-B CMP-C-BIOT PO Take by mouth daily.  Marland Kitchen glucose blood (ACCU-CHEK AVIVA) test strip Test BS QD and PRN  . lisinopril (PRINIVIL,ZESTRIL) 10 MG tablet Take 1 tablet (10 mg total) by mouth daily.  . metFORMIN (GLUCOPHAGE) 500 MG tablet TAKE 1 TABLET (500 MG TOTAL) BY MOUTH 2 (TWO) TIMES DAILY.  . metoprolol succinate (TOPROL-XL) 25 MG 24 hr tablet Take 1 tablet (25 mg total) by mouth daily.  . [DISCONTINUED] furosemide (LASIX) 20 MG tablet Take 1 tab po on Mon, wed and Fri mornings only.  . nitroGLYCERIN (NITROSTAT) 0.4 MG SL tablet Place 1 tablet (0.4 mg total) under the tongue every 5 (five) minutes x 3 doses as needed for chest pain. (Patient not taking: Reported on 08/22/2017)   No facility-administered encounter medications on file as of 08/22/2017.       Review of Systems  Constitutional: Negative.   HENT: Negative.   Eyes: Negative.   Respiratory: Negative.   Cardiovascular: Positive for leg swelling.  Gastrointestinal: Negative.   Endocrine: Negative.   Genitourinary: Negative.   Musculoskeletal: Negative.   Skin: Negative.   Allergic/Immunologic: Negative.   Neurological: Negative.   Hematological: Negative.  Psychiatric/Behavioral: Negative.        Objective:   Physical Exam  Constitutional: He is oriented to person, place, and time. He appears well-developed and well-nourished. No distress.  The patient appears to be somewhat forgetful and still is remorseful regarding the loss of his wife.  HENT:  Head: Normocephalic and atraumatic.  Left Ear: External ear normal.  Nose: Nose normal.  Mouth/Throat: Oropharynx is clear and moist. No oropharyngeal exudate.  Cerumen right ear canal.  This will be removed with irrigation today.  Eyes: Pupils are equal, round, and reactive to light. Conjunctivae and EOM are  normal. Right eye exhibits no discharge. Left eye exhibits no discharge.  Neck: Normal range of motion. Neck supple. No thyromegaly present.  Cardiovascular: Normal rate.  Murmur heard. Heart is irregular today at about 60/min.  There is a grade 3/6 systolic ejection murmur.  Distal pulses were present but difficult to palpate.  Pulmonary/Chest: Effort normal and breath sounds normal. He has no wheezes. He has no rales.  Clear anteriorly and posteriorly  Abdominal: Soft. Bowel sounds are normal. He exhibits no mass. There is no tenderness.  Musculoskeletal: Normal range of motion. He exhibits edema.  1+ pretibial 1+ pedal edema bilateral  Lymphadenopathy:    He has no cervical adenopathy.  Neurological: He is alert and oriented to person, place, and time. He has normal reflexes. No cranial nerve deficit.  Skin: Skin is warm and dry. No rash noted.  Psychiatric: His behavior is normal. Thought content normal.  The patient continues to have sadness following the loss of his wife.  He is also some concern about his memory.  Nursing note and vitals reviewed.  BP 121/62 (BP Location: Left Arm)   Pulse 70   Temp (!) 97 F (36.1 C) (Oral)   Ht '5\' 11"'  (1.803 m)   Wt 190 lb (86.2 kg)   SpO2 99%   BMI 26.50 kg/m         Assessment & Plan:  1. Edema, unspecified type -Continue to resume Lasix taking 20 mg on Monday Wednesday and Friday -Make sure that patient gets an appointment with the cardiologist as his year for follow-up is up. -The patient should return to the office in 4 weeks for a recheck of his edema and get another BMP -He should wear his support hose daily and put them on the first thing when he gets up in the morning -He should avoid and reduce his sodium intake especially leaving off the popcorn he is been eating more frequently. - BMP8+EGFR  Patient Instructions  Take Lasix 1 every morning on Monday Wednesday and Friday only We will arrange for the patient to have an  appointment with the cardiologist for follow-up The patient is encouraged to use his support hose every day and to put them on the first thing when he gets up in the morning He should continue to check his blood sugars regularly He should come back to the office in 4 weeks and we will recheck his edema and get another BMP at that time  Arrie Senate MD

## 2017-08-22 NOTE — Addendum Note (Signed)
Addended by: Zannie Cove on: 08/22/2017 03:33 PM   Modules accepted: Orders

## 2017-10-06 ENCOUNTER — Other Ambulatory Visit: Payer: Self-pay

## 2017-10-06 ENCOUNTER — Ambulatory Visit (HOSPITAL_COMMUNITY): Payer: Medicare HMO | Attending: Cardiovascular Disease

## 2017-10-06 ENCOUNTER — Other Ambulatory Visit: Payer: Self-pay | Admitting: Cardiology

## 2017-10-06 DIAGNOSIS — E785 Hyperlipidemia, unspecified: Secondary | ICD-10-CM | POA: Diagnosis not present

## 2017-10-06 DIAGNOSIS — E119 Type 2 diabetes mellitus without complications: Secondary | ICD-10-CM | POA: Insufficient documentation

## 2017-10-06 DIAGNOSIS — Z952 Presence of prosthetic heart valve: Secondary | ICD-10-CM | POA: Diagnosis not present

## 2017-10-06 DIAGNOSIS — I10 Essential (primary) hypertension: Secondary | ICD-10-CM | POA: Insufficient documentation

## 2017-10-06 DIAGNOSIS — R002 Palpitations: Secondary | ICD-10-CM | POA: Diagnosis not present

## 2017-10-06 MED ORDER — PERFLUTREN LIPID MICROSPHERE
1.0000 mL | INTRAVENOUS | Status: AC | PRN
  Administered 2017-10-06: 1 mL via INTRAVENOUS

## 2017-10-08 ENCOUNTER — Encounter: Payer: Self-pay | Admitting: *Deleted

## 2017-10-08 ENCOUNTER — Ambulatory Visit (INDEPENDENT_AMBULATORY_CARE_PROVIDER_SITE_OTHER): Payer: Medicare HMO | Admitting: *Deleted

## 2017-10-08 VITALS — BP 134/64 | HR 70 | Ht 65.0 in | Wt 189.0 lb

## 2017-10-08 DIAGNOSIS — Z0000 Encounter for general adult medical examination without abnormal findings: Secondary | ICD-10-CM | POA: Diagnosis not present

## 2017-10-08 NOTE — Progress Notes (Addendum)
Subjective:   [name] is a 84 y.o. male patient of Dr Tawanna Sat who presents for a [employer] Visit. [name] lives at home alone. His wife of 60 years passed away 3 months ago. He is understandably sad but. He has 3 daughters and 1 son. There was some controversy with his oldest daughter surrounding his wife's death and property. He is isn't talking to her right now. He is socially active and eats most meals out at a [employer]. He is retired but visits with past coworkers at [employer] here in town. He goes there regularly to talk. He also has a niece that he talks with regularly and that he trusts to give advice.   Review of Systems    Patient reports that his overall health is worse compared to last year due to the passing of his wife. He just feels off and has noticed some trouble with his memory.   Cardiac: Risk factors-Advanced age, sedentary, hyperlipidemia, hypertension  Psych: Sleeping and eating well. Grieving because of wife passing but is not depressed. He was a little tearful at times during the visit.   Neuro: has noticed some forgetfulness since his wife's passing and is having some trouble with balance.    All other systems negative       Current Medications (verified) Outpatient Encounter Medications as of 10/08/2017  Medication Sig  . ACCU-CHEK SOFTCLIX LANCETS lancets Use as instructed  . aspirin 81 MG tablet Take 81 mg by mouth daily.    Marland Kitchen atorvastatin (LIPITOR) 80 MG tablet TAKE 1 TABLET (80 MG TOTAL) BY MOUTH DAILY.  Marland Kitchen escitalopram (LEXAPRO) 20 MG tablet Take 1 tablet (20 mg total) by mouth daily.  Marland Kitchen FEFUM-FEPOLY-FA-B CMP-C-BIOT PO Take by mouth daily.  . furosemide (LASIX) 20 MG tablet Take 1 tab po on Mon, wed and Fri mornings only.  Marland Kitchen glucose blood (ACCU-CHEK AVIVA) test strip Test BS QD and PRN  . lisinopril (PRINIVIL,ZESTRIL) 10 MG tablet Take 1 tablet (10 mg total) by mouth daily.  . metFORMIN (GLUCOPHAGE) 500 MG tablet TAKE 1  TABLET (500 MG TOTAL) BY MOUTH 2 (TWO) TIMES DAILY.  . metoprolol succinate (TOPROL-XL) 25 MG 24 hr tablet Take 1 tablet (25 mg total) by mouth daily.  . nitroGLYCERIN (NITROSTAT) 0.4 MG SL tablet Place 1 tablet (0.4 mg total) under the tongue every 5 (five) minutes x 3 doses as needed for chest pain.   No facility-administered encounter medications on file as of 10/08/2017.     Allergies (verified) Patient has no known allergies.   History: Past Medical History:  Diagnosis Date  . Anemia    Iron deficiency  . Aortic stenosis    s/p pericardial AVR  . Arthritis   . Benign prostatic hypertrophy   . Coronary artery disease    s/p CABG 08/2010    . Diabetes mellitus   . Hyperlipidemia    Past Surgical History:  Procedure Laterality Date  . AORTIC VALVE REPLACEMENT  08/24/10   74mm pericardial tissue valve  . CORONARY ARTERY BYPASS GRAFT  08/24/10   LIMA to LAD, SVG to ramus intermediate, SVG to diagonal  . HERNIA REPAIR    . knee replacements     x 2   Family History  Problem Relation Age of Onset  . Cancer Mother   . Diabetes Father   . Hypertension Other   . Diabetes Other    Social History   Socioeconomic History  . Marital  status: Widowed    Spouse name: Not on file  . Number of children: 4  . Years of education: 62  . Highest education level: 11th grade  Occupational History  . Occupation: Retired  Scientific laboratory technician  . Financial resource strain: Not on file  . Food insecurity:    Worry: Not on file    Inability: Not on file  . Transportation needs:    Medical: Not on file    Non-medical: Not on file  Tobacco Use  . Smoking status: Former Smoker    Types: Cigars    Last attempt to quit: 09/17/1993    Years since quitting: 24.0  . Smokeless tobacco: Never Used  Substance and Sexual Activity  . Alcohol use: Yes    Comment: occassional  . Drug use: No  . Sexual activity: Yes  Lifestyle  . Physical activity:    Days per week: Not on file    Minutes per  session: Not on file  . Stress: Not on file  Relationships  . Social connections:    Talks on phone: Not on file    Gets together: Not on file    Attends religious service: Not on file    Active member of club or organization: Not on file    Attends meetings of clubs or organizations: Not on file    Relationship status: Not on file  Other Topics Concern  . Not on file  Social History Narrative  . Not on file    Tobacco Use No.  Clinical Intake:     Pain : 0-10 Pain Score: 3  Pain Location: Back Pain Descriptors / Indicators: Aching Pain Onset: 1 to 4 weeks ago Pain Frequency: Intermittent     Nutritional Status: BMI > 30  Obese Diabetes: Yes CBG done?: No Did pt. bring in CBG monitor from home?: No  How often do you need to have someone help you when you read instructions, pamphlets, or other written materials from your doctor or pharmacy?: 1 - Never What is the last grade level you completed in school?: 11  Interpreter Needed?: No  Information entered by :: Chong Sicilian, RN  Activities of Daily Living Functional Status Survey: Is the patient deaf or have difficulty hearing?: No Does the patient have difficulty seeing, even when wearing glasses/contacts?: No Does the patient have difficulty concentrating, remembering, or making decisions?: Yes(some difficulty since wife died 3 months ago. She kept things organized. ) Does the patient have difficulty walking or climbing stairs?: No Does the patient have difficulty dressing or bathing?: No Does the patient have difficulty doing errands alone such as visiting a doctor's office or shopping?: No    Diet Eats 3 meals a day and eats out for most of them  Exercise   No exercise at this time. Having trouble with balance.    Depression Screen PHQ 2/9 Scores 08/22/2017 08/07/2017 07/09/2017 04/01/2017  PHQ - 2 Score 1 1 0 0  PHQ- 9 Score - - - -     Fall Risk Fall Risk  08/22/2017 08/07/2017 08/07/2017 07/09/2017  04/01/2017  Falls in the past year? Yes Yes No No No  Number falls in past yr: 2 or more 2 or more - - -  Injury with Fall? No No - - -    Safety Is the patient's home free of loose throw rugs in walkways, pet beds, electrical cords, etc?   yes      Grab bars in the bathroom? no  Walkin shower? no      Shower Seat? no      Handrails on the stairs?   yes      Adequate lighting?   yes  Patient Care Team: Chipper Herb, MD as PCP - General (Family Medicine) Melrose Nakayama, MD (Cardiothoracic Surgery)  Hospitalizations, surgeries, and ER visits in previous 12 months No hospitalizations, ER visits, or surgeries this past year.   Objective:    Today's Vitals   10/08/17 0847 10/08/17 0903  BP: 134/64   Pulse: 70   Weight: 189 lb (85.7 kg)   Height: 5\' 5"  (1.651 m)   PainSc:  3    Body mass index is 31.45 kg/m.  Advanced Directives 10/08/2017 10/31/2016 07/25/2011 07/25/2011  Does Patient Have a Medical Advance Directive? No Yes Patient does not have advance directive Patient does not have advance directive  Type of Advance Directive - Living will - -  Would patient like information on creating a medical advance directive? Yes (MAU/Ambulatory/Procedural Areas - Information given) - - -  Pre-existing out of facility DNR order (yellow form or pink MOST form) - - No No    Hearing/Vision  normal or No deficits noted during visit.   Cognitive Function: MMSE - Mini Mental State Exam 10/08/2017  Orientation to time 2  Orientation to Place 2  Registration 3       Normal Cognitive Function Screening: No: Did not complete. No other baseline MMSE to compare. Patient is admittedly having some difficult with memory and did not want to complete the test. Will recommend checking at his next visit.     Immunizations and Health Maintenance Immunization History  Administered Date(s) Administered  . Influenza, High Dose Seasonal PF 02/27/2016, 02/03/2017  . Influenza,inj,Quad  PF,6+ Mos 01/13/2013, 02/11/2014, 01/13/2015  . Pneumococcal Conjugate-13 06/03/2013  . Pneumococcal Polysaccharide-23 04/16/2017   Health Maintenance Due  Topic Date Due  . OPHTHALMOLOGY EXAM  08/29/2016   Health Maintenance  Topic Date Due  . OPHTHALMOLOGY EXAM  08/29/2016  . COLONOSCOPY  08/08/2018 (Originally 05/24/2015)  . INFLUENZA VACCINE  10/23/2017  . HEMOGLOBIN A1C  02/07/2018  . FOOT EXAM  08/08/2018  . TETANUS/TDAP  12/23/2020  . PNA vac Low Risk Adult  Completed        Assessment:   This is a routine wellness examination for [place].      Plan:    Goals    . Exercise 150 min/wk Moderate Activity        Health Maintenance Recommendations: Advanced directives: has NO advanced directive  - add't info requested. Referral to SW: not applicable diabetic eye exam  Additional Screening Recommendations: Lung: Low Dose CT Chest recommended if Age 100-80 years, 30 pack-year currently smoking OR have quit w/in 15years. Patient does not qualify. Hepatitis C Screening recommended: no  Today's Orders No orders of the defined types were placed in this encounter.   Keep f/u with Chipper Herb, MD and any other specialty appointments you may have Continue current medications Advanced Directives given and discussed. Advised that if he decides to fill them out, he needs to appoint a durable power of attorney that he can trust and that will be willing to make decisions on his behalf.  Move carefully to avoid falls. Use assistive devices like a cane or walker if needed. Aim for at least 150 minutes of moderate activity a week. This can be chair exercises if necessary. Reading or puzzles are a good way to exercise your  brain Stay connected with friends and family. Social connections are beneficial to your emotional and mental health.   I have personally reviewed and noted the following in the patient's chart:   . Medical and social history . Use of alcohol, tobacco or  illicit drugs  . Current medications and supplements . Functional ability and status . Nutritional status . Physical activity . Advanced directives . List of other physicians . Hospitalizations, surgeries, and ER visits in previous 12 months . Vitals . Screenings to include cognitive, depression, and falls . Referrals and appointments  In addition, I have reviewed and discussed with patient certain preventive protocols, quality metrics, and best practice recommendations. A written personalized care plan for preventive services as well as general preventive health recommendations were provided to patient.     Chong Sicilian, RN   10/09/2017   I have reviewed and agree with the above AWV documentation.  Claretta Fraise, M.D.

## 2017-10-08 NOTE — Patient Instructions (Signed)
  [name] , Thank you for taking time to come for your Medicare Wellness Visit. I appreciate your ongoing commitment to your health goals. Please review the following plan we discussed and let me know if I can assist you in the future.   These are the goals we discussed: Goals    . Exercise 150 min/wk Moderate Activity       This is a list of the screening recommended for you and due dates:  Health Maintenance  Topic Date Due  . Eye exam for diabetics  08/29/2016  . Colon Cancer Screening  08/08/2018*  . Flu Shot  10/23/2017  . Hemoglobin A1C  02/07/2018  . Complete foot exam   08/08/2018  . Tetanus Vaccine  12/23/2020  . Pneumonia vaccines  Completed  *Topic was postponed. The date shown is not the original due date.   Please look over the papers I gave you on Advance Directives. The durable power of attorney would be a good thing to fill out. Bring [place] a copy once you do.

## 2017-10-13 NOTE — Progress Notes (Signed)
HPI The patient presents for followup of his aortic stenosis s/p aortic valve replacement.    He had normal valve function last week.  His wife of 39 years.  His daughter.  He is sad.  The patient denies any new symptoms such as chest discomfort, neck or arm discomfort. There has been no new shortness of breath, PND or orthopnea. There have been no reported palpitations, presyncope or syncope.  He has some chronic SOB climbing up a hill from his shed.  However, this is chronic.  He has some increased leg swelling.    No Known Allergies  Current Outpatient Medications  Medication Sig Dispense Refill  . aspirin 81 MG tablet Take 81 mg by mouth daily.      Marland Kitchen atorvastatin (LIPITOR) 80 MG tablet TAKE 1 TABLET (80 MG TOTAL) BY MOUTH DAILY. 90 tablet 1  . escitalopram (LEXAPRO) 20 MG tablet Take 1 tablet (20 mg total) by mouth daily. 30 tablet 3  . FEFUM-FEPOLY-FA-B CMP-C-BIOT PO Take by mouth daily.    . furosemide (LASIX) 20 MG tablet Take 1 tab po on Mon, wed and Fri mornings only. 36 tablet 3  . lisinopril (PRINIVIL,ZESTRIL) 10 MG tablet Take 1 tablet (10 mg total) by mouth daily. 30 tablet 11  . metFORMIN (GLUCOPHAGE) 500 MG tablet TAKE 1 TABLET (500 MG TOTAL) BY MOUTH 2 (TWO) TIMES DAILY. 180 tablet 0  . metoprolol succinate (TOPROL-XL) 25 MG 24 hr tablet Take 1 tablet (25 mg total) by mouth daily. 30 tablet 11  . nitroGLYCERIN (NITROSTAT) 0.4 MG SL tablet Place 1 tablet (0.4 mg total) under the tongue every 5 (five) minutes x 3 doses as needed for chest pain. 25 tablet prn  . ACCU-CHEK SOFTCLIX LANCETS lancets Use as instructed 100 each 12  . glucose blood (ACCU-CHEK AVIVA) test strip Test BS QD and PRN 100 each 11   No current facility-administered medications for this visit.     Past Medical History:  Diagnosis Date  . Anemia    Iron deficiency  . Aortic stenosis    s/p pericardial AVR  . Arthritis   . Benign prostatic hypertrophy   . Coronary artery disease    s/p CABG 08/2010     . Diabetes mellitus   . Hyperlipidemia     Past Surgical History:  Procedure Laterality Date  . AORTIC VALVE REPLACEMENT  08/24/10   75mm pericardial tissue valve  . CORONARY ARTERY BYPASS GRAFT  08/24/10   LIMA to LAD, SVG to ramus intermediate, SVG to diagonal  . HERNIA REPAIR    . knee replacements     x 2    ROS:   .As stated in the HPI and negative for all other systems.  PHYSICAL EXAM BP 110/62   Pulse 78   Ht 5\' 11"  (1.803 m)   Wt 194 lb (88 kg)   BMI 27.06 kg/m   GENERAL:  Well appearing NECK:  No jugular venous distention, waveform within normal limits, carotid upstroke brisk and symmetric, questionable bruit versus transmitted murmur , no thyromegaly LUNGS:  Clear to auscultation bilaterally CHEST:  Unremarkable HEART:  PMI not displaced or sustained,S1 and S2 within normal limits, no S3, no S4, no clicks, no rubs, 3 out of 6 systolic murmur radiating at the aortic outflow tract and into the carotids, no diastolic murmurs ABD:  Flat, positive bowel sounds normal in frequency in pitch, no bruits, no rebound, no guarding, no midline pulsatile mass, no hepatomegaly, no splenomegaly EXT:  2 plus pulses throughout, moderately severe calfedema, no cyanosis no clubbing   EKG:  Sinus rhythm, rate 78, axis leftward, borderline interventricular conduction delay, premature ectopic complexes, no acute ST-T wave changes.   10/15/2017   Lab Results  Component Value Date   CHOL 198 08/07/2017   TRIG 89 08/07/2017   HDL 56 08/07/2017   LDLCALC 124 (H) 08/07/2017     ASSESSMENT AND PLAN  PALPITATIONS:    He is not feeling these.  No further work up.   BRUIT:     I will order carotid Dopplers.  These were ordered before but I don't see that they were done.   CAD:   The patient has no new sypmtoms.  No further cardiovascular testing is indicated.  We will continue with aggressive risk reduction and meds as listed.  AS s/p AVR:    This was preserved on echo in July of this  year.  No change in therapy except as below.   HTN:  The blood pressure is at target.  No change in therapy.   DYSLIPIDEMIA:  His recent LDL was not at target.  He is going to come back today with his medications in the mid to make sure he is taking his 80 mg.  If so we will switch him to Crestor 40 mg and then have him repeat a lipid profile liver enzymes in about 8 weeks.  DM:  He has an excellent A1C of 5.8 in May. No change in therapy.   EDEMA: He has increased leg swelling.  He can wear compression stockings.  We talked about salt.  We talked by keeping his feet elevated.  I am going to have him take an extra Lasix 40 mg for 3 days and then follow-up with a basic metabolic profile.  I would like to see him back in a few weeks to see if the swelling is improved.

## 2017-10-15 ENCOUNTER — Ambulatory Visit: Payer: Medicare HMO | Admitting: Cardiology

## 2017-10-15 ENCOUNTER — Encounter: Payer: Self-pay | Admitting: Cardiology

## 2017-10-15 VITALS — BP 110/62 | HR 78 | Ht 71.0 in | Wt 194.0 lb

## 2017-10-15 DIAGNOSIS — E785 Hyperlipidemia, unspecified: Secondary | ICD-10-CM

## 2017-10-15 DIAGNOSIS — I10 Essential (primary) hypertension: Secondary | ICD-10-CM | POA: Diagnosis not present

## 2017-10-15 DIAGNOSIS — M7989 Other specified soft tissue disorders: Secondary | ICD-10-CM | POA: Diagnosis not present

## 2017-10-15 DIAGNOSIS — I2510 Atherosclerotic heart disease of native coronary artery without angina pectoris: Secondary | ICD-10-CM

## 2017-10-15 DIAGNOSIS — Z952 Presence of prosthetic heart valve: Secondary | ICD-10-CM

## 2017-10-15 DIAGNOSIS — R0989 Other specified symptoms and signs involving the circulatory and respiratory systems: Secondary | ICD-10-CM

## 2017-10-15 HISTORY — DX: Hyperlipidemia, unspecified: E78.5

## 2017-10-15 MED ORDER — ROSUVASTATIN CALCIUM 40 MG PO TABS: 40.0000 mg | ORAL_TABLET | Freq: Every day | ORAL | 3 refills | Status: DC

## 2017-10-15 NOTE — Patient Instructions (Addendum)
Medication Instructions:  Please stop Atorvastatin and start Crestor 40 mg a day. Take Lasix 40 mg for the next 3 days then go back to your normal dose. Continue all other medications as listed.  Labwork: Please have blood work at [facility] in 1 week. (BMP) Have fasting Lipid and Liver panel in 10 weeks (around 12/25/17) after changing to Crestor 40 mg a day.  Please wear knee high compression stockings daily.  You may remove them at bedtime.  Keep feet and legs elevated during the day as much as possible. Limit your sodium (salt) intake to help prevent swelling.  Testing: Your physician has requested that you have a carotid duplex. This test is an ultrasound of the carotid arteries in your neck. It looks at blood flow through these arteries that supply the brain with blood. Allow one hour for this exam. There are no restrictions or special instructions.  Follow-Up: Follow up in 1 month with Dr Percival Spanish in [place].  If you need a refill on your cardiac medications before your next appointment, please call your pharmacy.  Thank you for choosing [facility]!!

## 2017-10-15 NOTE — Addendum Note (Signed)
Addended by: Shellia Cleverly on: 10/15/2017 12:06 PM   Modules accepted: Orders

## 2017-10-21 ENCOUNTER — Ambulatory Visit (HOSPITAL_COMMUNITY): Admission: RE | Admit: 2017-10-21 | Payer: Medicare HMO | Source: Ambulatory Visit

## 2017-10-21 DIAGNOSIS — R0989 Other specified symptoms and signs involving the circulatory and respiratory systems: Secondary | ICD-10-CM

## 2017-10-22 ENCOUNTER — Encounter (HOSPITAL_COMMUNITY): Payer: Self-pay | Admitting: Cardiology

## 2017-11-03 ENCOUNTER — Encounter (HOSPITAL_COMMUNITY): Payer: Self-pay | Admitting: Emergency Medicine

## 2017-11-03 ENCOUNTER — Emergency Department (HOSPITAL_COMMUNITY)
Admission: EM | Admit: 2017-11-03 | Discharge: 2017-11-03 | Discharge status: Against Medical Advice | Payer: Medicare HMO | Attending: Emergency Medicine | Admitting: Emergency Medicine

## 2017-11-03 ENCOUNTER — Emergency Department (HOSPITAL_COMMUNITY): Payer: Medicare HMO

## 2017-11-03 ENCOUNTER — Other Ambulatory Visit: Payer: Self-pay

## 2017-11-03 ENCOUNTER — Ambulatory Visit (INDEPENDENT_AMBULATORY_CARE_PROVIDER_SITE_OTHER): Payer: Medicare HMO | Admitting: Family Medicine

## 2017-11-03 VITALS — BP 133/73 | HR 98 | Temp 97.0°F | Resp 18 | Wt 188.0 lb

## 2017-11-03 DIAGNOSIS — Z5321 Procedure and treatment not carried out due to patient leaving prior to being seen by health care provider: Secondary | ICD-10-CM | POA: Insufficient documentation

## 2017-11-03 DIAGNOSIS — I491 Atrial premature depolarization: Secondary | ICD-10-CM | POA: Diagnosis not present

## 2017-11-03 DIAGNOSIS — R0789 Other chest pain: Secondary | ICD-10-CM | POA: Diagnosis not present

## 2017-11-03 DIAGNOSIS — R079 Chest pain, unspecified: Secondary | ICD-10-CM | POA: Diagnosis not present

## 2017-11-03 DIAGNOSIS — R0689 Other abnormalities of breathing: Secondary | ICD-10-CM | POA: Diagnosis not present

## 2017-11-03 DIAGNOSIS — R000 Tachycardia, unspecified: Secondary | ICD-10-CM | POA: Diagnosis not present

## 2017-11-03 LAB — BASIC METABOLIC PANEL
ANION GAP: 7 (ref 5–15)
BUN: 13 mg/dL (ref 8–23)
CALCIUM: 8.7 mg/dL — AB (ref 8.9–10.3)
CO2: 25 mmol/L (ref 22–32)
Chloride: 107 mmol/L (ref 98–111)
Creatinine, Ser: 1.17 mg/dL (ref 0.61–1.24)
GFR, EST NON AFRICAN AMERICAN: 56 mL/min — AB (ref >60)
GLUCOSE: 129 mg/dL — AB (ref 70–99)
Potassium: 4 mmol/L (ref 3.5–5.1)
Sodium: 139 mmol/L (ref 135–145)

## 2017-11-03 LAB — CBC
HCT: 31.2 % — ABNORMAL LOW (ref 39.0–52.0)
HEMOGLOBIN: 9.4 g/dL — AB (ref 13.0–17.0)
MCH: 24.2 pg — ABNORMAL LOW (ref 26.0–34.0)
MCHC: 30.1 g/dL (ref 30.0–36.0)
MCV: 80.2 fL (ref 78.0–100.0)
Platelets: 200 10*3/uL (ref 150–400)
RBC: 3.89 MIL/uL — ABNORMAL LOW (ref 4.22–5.81)
RDW: 15.4 % (ref 11.5–15.5)
WBC: 6.7 10*3/uL (ref 4.0–10.5)

## 2017-11-03 LAB — I-STAT TROPONIN, ED: TROPONIN I, POC: 0.01 ng/mL (ref 0.00–0.08)

## 2017-11-03 NOTE — Progress Notes (Signed)
Subjective:  Patient ID: [name], male    DOB: 01-Jul-1932  Age: 84 y.o. MRN: [id number]  CC: Chest Pain (off and on x 2 days, )   HPI [name] presents for Onset yesterday afternoon of chest pain. Nonradiating, precordial.  Patient has difficulty with determining the character of the pain.  Is just a pain he says.  It is not related to exertion.  It does not cause shortness of breath or nausea or diaphoresis.  He does have some baseline shortness of breath and edema for which he takes Lasix.  He says he is been more swollen recently and more short of breath recently than usual.  The chest pains have occurred at least 4 different times since yesterday afternoon lasting varying times.  This morning just lasted a couple of minutes after he got up around 7 AM.  There were 2 episodes yesterday evening and last night that he cannot define regarding length or quality.  However he specifically denies radiation diaphoresis shortness of breath. Depression screen [facility] 2/9 08/22/2017 08/07/2017 07/09/2017  Decreased Interest 0 0 0  Down, Depressed, Hopeless 1 1 0  PHQ - 2 Score 1 1 0  Altered sleeping - - -  Tired, decreased energy - - -  Change in appetite - - -  Feeling bad or failure about yourself  - - -  Trouble concentrating - - -  Moving slowly or fidgety/restless - - -  Suicidal thoughts - - -  PHQ-9 Score - - -  Difficult doing work/chores - - -    History [name] has a past medical history of Anemia, Aortic stenosis, Aortic valve disorder (06/23/2008), Arthritis, Benign prostatic hypertrophy, CAD (coronary artery disease) (01/30/2011), Carotid bruit (08/21/2016), COLONIC POLYPS (06/23/2008), Coronary artery disease, Diabetes mellitus, Dyslipidemia (10/15/2017), Essential hypertension (08/21/2016), H/O aortic valve replacement (08/21/2016), Hyperlipidemia, Hyperlipidemia LDL goal <70 (06/23/2008), Leg swelling (10/15/2017), Orthostatic hypotension (06/22/2012), Palpitations (08/21/2016), Precordial pain  (09/18/2011), Type 2 diabetes mellitus with hyperlipidemia ([place]) (06/23/2008), and Unstable angina ([place]) (07/24/2011).   He has a past surgical history that includes knee replacements; Hernia repair; Aortic valve replacement (08/24/10); and Coronary artery bypass graft (08/24/10).   His family history includes Cancer in his mother; Diabetes in his father and other; Hypertension in his other.He reports that he quit smoking about 24 years ago. His smoking use included cigars. He has never used smokeless tobacco. He reports that he drinks alcohol. He reports that he does not use drugs.    ROS Review of Systems  Constitutional: Negative for activity change, appetite change, diaphoresis and fever.  HENT: Negative.   Respiratory: Positive for shortness of breath.   Cardiovascular: Positive for chest pain and leg swelling. Negative for palpitations.  Gastrointestinal: Negative for abdominal distention, abdominal pain, nausea and vomiting.  Neurological: Negative for dizziness.    Objective:  BP 133/73 (BP Location: Left Arm, Patient Position: Sitting, Cuff Size: Normal)   Pulse 98   Temp (!) 97 F (36.1 C) (Oral)   Resp 18   Wt 188 lb (85.3 kg)   SpO2 94%   BMI 26.22 kg/m    BP Readings from Last 3 Encounters:  11/03/17 133/73  10/15/17 110/62  10/08/17 134/64    Wt Readings from Last 3 Encounters:  11/03/17 188 lb (85.3 kg)  10/15/17 194 lb (88 kg)  10/08/17 189 lb (85.7 kg)     Physical Exam  Constitutional: He appears well-developed and well-nourished.  HENT:  Head: Normocephalic and atraumatic.  Right Ear: Tympanic membrane and external ear normal. No decreased hearing is noted.  Left Ear: Tympanic membrane and external ear normal. No decreased hearing is noted.  Mouth/Throat: No oropharyngeal exudate or posterior oropharyngeal erythema.  Eyes: Pupils are equal, round, and reactive to light.  Neck: Normal range of motion. Neck supple.  Cardiovascular: Normal rate, regular  rhythm, S1 normal, S2 normal and normal pulses. Exam reveals gallop and S3.  Murmur heard.  Systolic murmur is present with a grade of 3/6. Pulmonary/Chest: Breath sounds normal. No respiratory distress.  Abdominal: Soft. Bowel sounds are normal. He exhibits no mass. There is no tenderness.  Vitals reviewed.    EKG is unchanged from 2 weeks ago and Dr. Rosezella [place] office.  There is a left anterior fascicle block but no acute changes. Assessment & Plan:   [name] was seen today for chest pain.  Diagnoses and all orders for this visit:  Other chest pain -     EKG 12-Lead       I am having [name] maintain his aspirin, ACCU-CHEK SOFTCLIX LANCETS, glucose blood, escitalopram, FEFUM-FEPOLY-FA-B CMP-C-BIOT PO, nitroGLYCERIN, metoprolol succinate, lisinopril, metFORMIN, furosemide, and rosuvastatin.  Allergies as of 11/03/2017   No Known Allergies     Medication List        Accurate as of 11/03/17 10:22 AM. Always use your most recent med list.          ACCU-CHEK SOFTCLIX LANCETS lancets Use as instructed   aspirin 81 MG tablet Take 81 mg by mouth daily.   escitalopram 20 MG tablet Commonly known as:  LEXAPRO Take 1 tablet (20 mg total) by mouth daily.   FEFUM-FEPOLY-FA-B CMP-C-BIOT PO Take by mouth daily.   furosemide 20 MG tablet Commonly known as:  LASIX Take 1 tab po on Mon, wed and Fri mornings only.   glucose blood test strip Test BS QD and PRN   lisinopril 10 MG tablet Commonly known as:  PRINIVIL,ZESTRIL Take 1 tablet (10 mg total) by mouth daily.   metFORMIN 500 MG tablet Commonly known as:  GLUCOPHAGE TAKE 1 TABLET (500 MG TOTAL) BY MOUTH 2 (TWO) TIMES DAILY.   metoprolol succinate 25 MG 24 hr tablet Commonly known as:  TOPROL-XL Take 1 tablet (25 mg total) by mouth daily.   nitroGLYCERIN 0.4 MG SL tablet Commonly known as:  NITROSTAT Place 1 tablet (0.4 mg total) under the tongue every 5 (five) minutes x 3 doses as needed for chest pain.     rosuvastatin 40 MG tablet Commonly known as:  CRESTOR Take 1 tablet (40 mg total) by mouth daily.      Patient has multiple cardiac risk factors including previous history, diabetes, age, male gender.  Symptoms sufficient to have an transferred to the ER for serial enzymes etc.  Follow-up: Return if symptoms worsen or fail to improve.  Claretta Fraise, M.D.

## 2017-11-03 NOTE — Discharge Instructions (Addendum)
You are leaving against our advice.  Your chest pain is worrisome for cardiac cause.  Feel free to return at any time and follow-up with cardiology soon as possible.

## 2017-11-03 NOTE — ED Notes (Signed)
Pt states understanding of AMA statement. Family with pt and he is ambulatory to lobby with steady and even gait.

## 2017-11-03 NOTE — ED Triage Notes (Signed)
Pt comes in from [place] for CP 2-3 days. Was given 4 81Mg  ASA PTA by EMS. Pt denies CP at this time.

## 2017-11-18 NOTE — Progress Notes (Signed)
HPI The patient presents for followup of his aortic stenosis s/p aortic valve replacement.    He had normal valve function last month.  At the last visit he had increased leg swelling and I treated with diuresis.  It is not clear that he took his diuretic twice a day for 3 days but he thinks he did.  He is taking it 3 times a week now he believes.  He was eating quite a bit of salt and had popcorn most days until recently.  His weight seems to be up a little bit.  He is not complaining of new shortness of breath, PND or orthopnea.  Is not having any chest pain, presyncope or syncope.  He is very tearful about his wife of 8 years who died.  He is estranged from his daughter now because of this.  He is taking care of his uncles daughter and having to do her yard.  It seems like he is under a lot of stress.   No Known Allergies  Current Outpatient Medications  Medication Sig Dispense Refill  . aspirin 81 MG tablet Take 81 mg by mouth daily.      Marland Kitchen escitalopram (LEXAPRO) 20 MG tablet Take 1 tablet (20 mg total) by mouth daily. 30 tablet 3  . FEFUM-FEPOLY-FA-B CMP-C-BIOT PO Take by mouth daily.    . furosemide (LASIX) 20 MG tablet Take 1 tablet (20 mg total) by mouth daily. 90 tablet 3  . lisinopril (PRINIVIL,ZESTRIL) 10 MG tablet Take 1 tablet (10 mg total) by mouth daily. 30 tablet 11  . metFORMIN (GLUCOPHAGE) 500 MG tablet TAKE 1 TABLET (500 MG TOTAL) BY MOUTH 2 (TWO) TIMES DAILY. 180 tablet 0  . metoprolol succinate (TOPROL-XL) 25 MG 24 hr tablet Take 1 tablet (25 mg total) by mouth daily. 30 tablet 11  . nitroGLYCERIN (NITROSTAT) 0.4 MG SL tablet Place 1 tablet (0.4 mg total) under the tongue every 5 (five) minutes x 3 doses as needed for chest pain. 25 tablet prn  . rosuvastatin (CRESTOR) 40 MG tablet Take 1 tablet (40 mg total) by mouth daily. 90 tablet 3  . ACCU-CHEK SOFTCLIX LANCETS lancets Use as instructed 100 each 12  . glucose blood (ACCU-CHEK AVIVA) test strip Test BS QD and PRN  100 each 11   No current facility-administered medications for this visit.     Past Medical History:  Diagnosis Date  . Anemia    Iron deficiency  . Aortic stenosis    s/p pericardial AVR  . Aortic valve disorder 06/23/2008   Qualifier: Diagnosis of  By: Mare Ferrari, RMA, Sherri    . Arthritis   . Benign prostatic hypertrophy   . CAD (coronary artery disease) 01/30/2011  . Carotid bruit 08/21/2016  . COLONIC POLYPS 06/23/2008   Qualifier: Diagnosis of  By: Mare Ferrari, RMA, Sherri    . Coronary artery disease    s/p CABG 08/2010    . Diabetes mellitus   . Dyslipidemia 10/15/2017  . Essential hypertension 08/21/2016  . H/O aortic valve replacement 08/21/2016  . Hyperlipidemia   . Hyperlipidemia LDL goal <70 06/23/2008   Qualifier: Diagnosis of  By: Mare Ferrari, RMA, Sherri    . Leg swelling 10/15/2017  . Orthostatic hypotension 06/22/2012  . Palpitations 08/21/2016  . Precordial pain 09/18/2011  . Type 2 diabetes mellitus with hyperlipidemia ([place]) 06/23/2008   Qualifier: Diagnosis of  By: Mare Ferrari, RMA, Sherri    . Unstable angina ([place]) 07/24/2011    Past Surgical History:  Procedure Laterality Date  . AORTIC VALVE REPLACEMENT  08/24/10   51mm pericardial tissue valve  . CORONARY ARTERY BYPASS GRAFT  08/24/10   LIMA to LAD, SVG to ramus intermediate, SVG to diagonal  . HERNIA REPAIR    . knee replacements     x 2    ROS:  As stated in the HPI and negative for all other systems.  PHYSICAL EXAM BP 130/60   Pulse (!) 112   Ht 5\' 11"  (1.803 m)   Wt 192 lb (87.1 kg)   BMI 26.78 kg/m   GENERAL:  Well appearing NECK:  No jugular venous distention, waveform within normal limits, carotid upstroke brisk and symmetric, no bruits, no thyromegaly LUNGS:  Clear to auscultation bilaterally CHEST:  Well healed sternotomy scar. HEART:  PMI not displaced or sustained,S1 and S2 within normal limits, no S3, no S4, no clicks, no rubs, 3 out of 6 apical systolic murmur radiating out the aortic outflow tract, no  diastolic murmurs ABD:  Flat, positive bowel sounds normal in frequency in pitch, no bruits, no rebound, no guarding, no midline pulsatile mass, no hepatomegaly, no splenomegaly EXT:  2 plus pulses throughout,, moderate bilateral leg edema, no cyanosis no clubbing   EKG: Normal sinus rhythm, rate 92, leftward axis, no acute ST-T wave changes.  Lab Results  Component Value Date   CHOL 198 08/07/2017   TRIG 89 08/07/2017   HDL 56 08/07/2017   LDLCALC 124 (H) 08/07/2017     ASSESSMENT AND PLAN  PALPITATIONS:     He does not notice a rapid heart rate or ectopy.   No change in therapy.   BRUIT:    He is to have carotid Doppler.       I will order carotid Dopplers when I see him next.   CAD:   He has no new symptoms.  No change in therapy.   AS s/p AVR:        This was preserved on echo in July of this year.  No change in therapy.   HTN:    The blood pressure is at target. No change in medications is indicated. We will continue with therapeutic lifestyle changes (TLC).  DYSLIPIDEMIA:     I switched to Crestor recently.  He will need a repeat lipid in about eight weeks.   DM:     A1C was 5.8 in May.  No change.   EDEMA:    I talked to him about reduced salt.  I want him to increase his Lasix to daily.  He will need a basic metabolic profile in 1 month.  He should wear the compression stockings which he is not doing today.

## 2017-11-19 ENCOUNTER — Ambulatory Visit (INDEPENDENT_AMBULATORY_CARE_PROVIDER_SITE_OTHER): Payer: Medicare HMO | Admitting: Cardiology

## 2017-11-19 ENCOUNTER — Encounter: Payer: Self-pay | Admitting: Cardiology

## 2017-11-19 VITALS — BP 130/60 | HR 112 | Ht 71.0 in | Wt 192.0 lb

## 2017-11-19 DIAGNOSIS — Z79899 Other long term (current) drug therapy: Secondary | ICD-10-CM | POA: Diagnosis not present

## 2017-11-19 DIAGNOSIS — M7989 Other specified soft tissue disorders: Secondary | ICD-10-CM

## 2017-11-19 DIAGNOSIS — I10 Essential (primary) hypertension: Secondary | ICD-10-CM

## 2017-11-19 DIAGNOSIS — R000 Tachycardia, unspecified: Secondary | ICD-10-CM | POA: Diagnosis not present

## 2017-11-19 MED ORDER — FUROSEMIDE 20 MG PO TABS: 20.0000 mg | ORAL_TABLET | Freq: Every day | ORAL | 3 refills | Status: DC

## 2017-11-19 NOTE — Progress Notes (Signed)
Make sure that patient gets lipid panel in about 8 weeks

## 2017-11-19 NOTE — Patient Instructions (Signed)
Medication Instructions:  Please increase Furosemide to 20mg  every day. Continue all other medications as listed.  Labwork: Please have blood work in 1 month at [facility].  (BMP)  Follow-Up: Follow up in 3 months with Dr Percival Spanish.  If you need a refill on your cardiac medications before your next appointment, please call your pharmacy.  Thank you for choosing [facility]!!   '

## 2017-11-25 DIAGNOSIS — M9901 Segmental and somatic dysfunction of cervical region: Secondary | ICD-10-CM | POA: Diagnosis not present

## 2017-11-25 DIAGNOSIS — M9903 Segmental and somatic dysfunction of lumbar region: Secondary | ICD-10-CM | POA: Diagnosis not present

## 2017-11-25 DIAGNOSIS — M9902 Segmental and somatic dysfunction of thoracic region: Secondary | ICD-10-CM | POA: Diagnosis not present

## 2017-11-25 DIAGNOSIS — M62830 Muscle spasm of back: Secondary | ICD-10-CM | POA: Diagnosis not present

## 2017-11-26 DIAGNOSIS — M62830 Muscle spasm of back: Secondary | ICD-10-CM | POA: Diagnosis not present

## 2017-11-26 DIAGNOSIS — M9901 Segmental and somatic dysfunction of cervical region: Secondary | ICD-10-CM | POA: Diagnosis not present

## 2017-11-26 DIAGNOSIS — M9902 Segmental and somatic dysfunction of thoracic region: Secondary | ICD-10-CM | POA: Diagnosis not present

## 2017-11-26 DIAGNOSIS — M9903 Segmental and somatic dysfunction of lumbar region: Secondary | ICD-10-CM | POA: Diagnosis not present

## 2017-11-27 DIAGNOSIS — M9903 Segmental and somatic dysfunction of lumbar region: Secondary | ICD-10-CM | POA: Diagnosis not present

## 2017-11-27 DIAGNOSIS — M9902 Segmental and somatic dysfunction of thoracic region: Secondary | ICD-10-CM | POA: Diagnosis not present

## 2017-11-27 DIAGNOSIS — M9901 Segmental and somatic dysfunction of cervical region: Secondary | ICD-10-CM | POA: Diagnosis not present

## 2017-11-27 DIAGNOSIS — M62830 Muscle spasm of back: Secondary | ICD-10-CM | POA: Diagnosis not present

## 2017-12-01 DIAGNOSIS — M9902 Segmental and somatic dysfunction of thoracic region: Secondary | ICD-10-CM | POA: Diagnosis not present

## 2017-12-01 DIAGNOSIS — M9901 Segmental and somatic dysfunction of cervical region: Secondary | ICD-10-CM | POA: Diagnosis not present

## 2017-12-01 DIAGNOSIS — M62830 Muscle spasm of back: Secondary | ICD-10-CM | POA: Diagnosis not present

## 2017-12-01 DIAGNOSIS — M9903 Segmental and somatic dysfunction of lumbar region: Secondary | ICD-10-CM | POA: Diagnosis not present

## 2017-12-03 DIAGNOSIS — M9903 Segmental and somatic dysfunction of lumbar region: Secondary | ICD-10-CM | POA: Diagnosis not present

## 2017-12-03 DIAGNOSIS — M9902 Segmental and somatic dysfunction of thoracic region: Secondary | ICD-10-CM | POA: Diagnosis not present

## 2017-12-03 DIAGNOSIS — M62830 Muscle spasm of back: Secondary | ICD-10-CM | POA: Diagnosis not present

## 2017-12-03 DIAGNOSIS — M9901 Segmental and somatic dysfunction of cervical region: Secondary | ICD-10-CM | POA: Diagnosis not present

## 2017-12-04 DIAGNOSIS — M9903 Segmental and somatic dysfunction of lumbar region: Secondary | ICD-10-CM | POA: Diagnosis not present

## 2017-12-04 DIAGNOSIS — M9902 Segmental and somatic dysfunction of thoracic region: Secondary | ICD-10-CM | POA: Diagnosis not present

## 2017-12-04 DIAGNOSIS — M62830 Muscle spasm of back: Secondary | ICD-10-CM | POA: Diagnosis not present

## 2017-12-04 DIAGNOSIS — M9901 Segmental and somatic dysfunction of cervical region: Secondary | ICD-10-CM | POA: Diagnosis not present

## 2017-12-08 DIAGNOSIS — M62830 Muscle spasm of back: Secondary | ICD-10-CM | POA: Diagnosis not present

## 2017-12-08 DIAGNOSIS — M9903 Segmental and somatic dysfunction of lumbar region: Secondary | ICD-10-CM | POA: Diagnosis not present

## 2017-12-08 DIAGNOSIS — M9902 Segmental and somatic dysfunction of thoracic region: Secondary | ICD-10-CM | POA: Diagnosis not present

## 2017-12-08 DIAGNOSIS — M9901 Segmental and somatic dysfunction of cervical region: Secondary | ICD-10-CM | POA: Diagnosis not present

## 2017-12-10 DIAGNOSIS — M62830 Muscle spasm of back: Secondary | ICD-10-CM | POA: Diagnosis not present

## 2017-12-10 DIAGNOSIS — M9902 Segmental and somatic dysfunction of thoracic region: Secondary | ICD-10-CM | POA: Diagnosis not present

## 2017-12-10 DIAGNOSIS — M9901 Segmental and somatic dysfunction of cervical region: Secondary | ICD-10-CM | POA: Diagnosis not present

## 2017-12-10 DIAGNOSIS — M9903 Segmental and somatic dysfunction of lumbar region: Secondary | ICD-10-CM | POA: Diagnosis not present

## 2017-12-11 DIAGNOSIS — M9903 Segmental and somatic dysfunction of lumbar region: Secondary | ICD-10-CM | POA: Diagnosis not present

## 2017-12-11 DIAGNOSIS — M9901 Segmental and somatic dysfunction of cervical region: Secondary | ICD-10-CM | POA: Diagnosis not present

## 2017-12-11 DIAGNOSIS — M9902 Segmental and somatic dysfunction of thoracic region: Secondary | ICD-10-CM | POA: Diagnosis not present

## 2017-12-11 DIAGNOSIS — M62830 Muscle spasm of back: Secondary | ICD-10-CM | POA: Diagnosis not present

## 2017-12-15 DIAGNOSIS — M9902 Segmental and somatic dysfunction of thoracic region: Secondary | ICD-10-CM | POA: Diagnosis not present

## 2017-12-15 DIAGNOSIS — M62830 Muscle spasm of back: Secondary | ICD-10-CM | POA: Diagnosis not present

## 2017-12-15 DIAGNOSIS — M9903 Segmental and somatic dysfunction of lumbar region: Secondary | ICD-10-CM | POA: Diagnosis not present

## 2017-12-15 DIAGNOSIS — M9901 Segmental and somatic dysfunction of cervical region: Secondary | ICD-10-CM | POA: Diagnosis not present

## 2017-12-19 ENCOUNTER — Emergency Department (HOSPITAL_COMMUNITY)
Admission: EM | Admit: 2017-12-19 | Discharge: 2017-12-19 | Disposition: A | Discharge status: Home / Self Care | Payer: Medicare HMO | Attending: Emergency Medicine | Admitting: Emergency Medicine

## 2017-12-19 ENCOUNTER — Other Ambulatory Visit: Payer: Self-pay

## 2017-12-19 ENCOUNTER — Emergency Department (HOSPITAL_COMMUNITY): Payer: Medicare HMO

## 2017-12-19 ENCOUNTER — Encounter (HOSPITAL_COMMUNITY): Payer: Self-pay | Admitting: Emergency Medicine

## 2017-12-19 DIAGNOSIS — Z7982 Long term (current) use of aspirin: Secondary | ICD-10-CM | POA: Insufficient documentation

## 2017-12-19 DIAGNOSIS — R600 Localized edema: Secondary | ICD-10-CM | POA: Insufficient documentation

## 2017-12-19 DIAGNOSIS — R4182 Altered mental status, unspecified: Secondary | ICD-10-CM | POA: Diagnosis not present

## 2017-12-19 DIAGNOSIS — S79911A Unspecified injury of right hip, initial encounter: Secondary | ICD-10-CM | POA: Diagnosis not present

## 2017-12-19 DIAGNOSIS — I2510 Atherosclerotic heart disease of native coronary artery without angina pectoris: Secondary | ICD-10-CM | POA: Diagnosis not present

## 2017-12-19 DIAGNOSIS — Z951 Presence of aortocoronary bypass graft: Secondary | ICD-10-CM | POA: Insufficient documentation

## 2017-12-19 DIAGNOSIS — Z87891 Personal history of nicotine dependence: Secondary | ICD-10-CM | POA: Insufficient documentation

## 2017-12-19 DIAGNOSIS — E119 Type 2 diabetes mellitus without complications: Secondary | ICD-10-CM | POA: Insufficient documentation

## 2017-12-19 DIAGNOSIS — Z7984 Long term (current) use of oral hypoglycemic drugs: Secondary | ICD-10-CM | POA: Insufficient documentation

## 2017-12-19 DIAGNOSIS — R5381 Other malaise: Secondary | ICD-10-CM | POA: Diagnosis not present

## 2017-12-19 DIAGNOSIS — D649 Anemia, unspecified: Secondary | ICD-10-CM

## 2017-12-19 DIAGNOSIS — W19XXXA Unspecified fall, initial encounter: Secondary | ICD-10-CM | POA: Insufficient documentation

## 2017-12-19 DIAGNOSIS — R000 Tachycardia, unspecified: Secondary | ICD-10-CM | POA: Diagnosis not present

## 2017-12-19 DIAGNOSIS — Y999 Unspecified external cause status: Secondary | ICD-10-CM | POA: Diagnosis not present

## 2017-12-19 DIAGNOSIS — R55 Syncope and collapse: Secondary | ICD-10-CM | POA: Diagnosis not present

## 2017-12-19 DIAGNOSIS — Y939 Activity, unspecified: Secondary | ICD-10-CM | POA: Insufficient documentation

## 2017-12-19 DIAGNOSIS — I10 Essential (primary) hypertension: Secondary | ICD-10-CM | POA: Diagnosis not present

## 2017-12-19 DIAGNOSIS — Y92009 Unspecified place in unspecified non-institutional (private) residence as the place of occurrence of the external cause: Secondary | ICD-10-CM | POA: Diagnosis not present

## 2017-12-19 DIAGNOSIS — S79912A Unspecified injury of left hip, initial encounter: Secondary | ICD-10-CM | POA: Diagnosis not present

## 2017-12-19 LAB — BASIC METABOLIC PANEL
Anion gap: 8 (ref 5–15)
BUN: 14 mg/dL (ref 8–23)
CO2: 24 mmol/L (ref 22–32)
CREATININE: 1.05 mg/dL (ref 0.61–1.24)
Calcium: 8.7 mg/dL — ABNORMAL LOW (ref 8.9–10.3)
Chloride: 102 mmol/L (ref 98–111)
GFR calc Af Amer: >60 mL/min (ref >60)
GFR calc non Af Amer: >60 mL/min (ref >60)
Glucose, Bld: 130 mg/dL — ABNORMAL HIGH (ref 70–99)
Potassium: 4.2 mmol/L (ref 3.5–5.1)
SODIUM: 134 mmol/L — AB (ref 135–145)

## 2017-12-19 LAB — CBC
HCT: 26.2 % — ABNORMAL LOW (ref 39.0–52.0)
Hemoglobin: 7.9 g/dL — ABNORMAL LOW (ref 13.0–17.0)
MCH: 23.4 pg — ABNORMAL LOW (ref 26.0–34.0)
MCHC: 30.2 g/dL (ref 30.0–36.0)
MCV: 77.5 fL — ABNORMAL LOW (ref 78.0–100.0)
PLATELETS: 284 10*3/uL (ref 150–400)
RBC: 3.38 MIL/uL — ABNORMAL LOW (ref 4.22–5.81)
RDW: 15.3 % (ref 11.5–15.5)
WBC: 10 10*3/uL (ref 4.0–10.5)

## 2017-12-19 LAB — URINALYSIS, ROUTINE W REFLEX MICROSCOPIC
Bilirubin Urine: NEGATIVE
Glucose, UA: NEGATIVE mg/dL
Hgb urine dipstick: NEGATIVE
KETONES UR: NEGATIVE mg/dL
LEUKOCYTES UA: NEGATIVE
Nitrite: NEGATIVE
PROTEIN: NEGATIVE mg/dL
Specific Gravity, Urine: 1.018 (ref 1.005–1.030)
pH: 5 (ref 5.0–8.0)

## 2017-12-19 LAB — HEPATIC FUNCTION PANEL
ALBUMIN: 3.2 g/dL — AB (ref 3.5–5.0)
ALT: 23 U/L (ref 0–44)
AST: 37 U/L (ref 15–41)
Alkaline Phosphatase: 93 U/L (ref 38–126)
BILIRUBIN DIRECT: 0.3 mg/dL — AB (ref 0.0–0.2)
Indirect Bilirubin: 0.7 mg/dL (ref 0.3–0.9)
Total Bilirubin: 1 mg/dL (ref 0.3–1.2)
Total Protein: 7.3 g/dL (ref 6.5–8.1)

## 2017-12-19 LAB — TYPE AND SCREEN
ABO/RH(D): O POS
Antibody Screen: NEGATIVE

## 2017-12-19 LAB — CK: Total CK: 92 U/L (ref 49–397)

## 2017-12-19 LAB — CBG MONITORING, ED: GLUCOSE-CAPILLARY: 138 mg/dL — AB (ref 70–99)

## 2017-12-19 LAB — LIPASE, BLOOD: Lipase: 37 U/L (ref 11–51)

## 2017-12-19 LAB — TROPONIN I

## 2017-12-19 LAB — BRAIN NATRIURETIC PEPTIDE: B NATRIURETIC PEPTIDE 5: 78 pg/mL (ref 0.0–100.0)

## 2017-12-19 MED ORDER — SODIUM CHLORIDE 0.9 % IV SOLN
INTRAVENOUS | Status: DC
  Administered 2017-12-19: 14:00:00 via INTRAVENOUS

## 2017-12-19 MED ORDER — SODIUM CHLORIDE 0.9 % IV BOLUS
500.0000 mL | Freq: Once | INTRAVENOUS | Status: AC
  Administered 2017-12-19: 500 mL via INTRAVENOUS

## 2017-12-19 NOTE — Discharge Instructions (Addendum)
As we discussed your hemoglobin is consistent with anemia and low enough for admission and observation overnight understand that you do not want to have that done.  Hemoglobin was 7.9.  Recommend that you follow-up with Dr. Laurance Flatten in the next few days to have that rechecked.  Also this information provided to your daughter.  Return for any new or worse symptoms.

## 2017-12-19 NOTE — ED Notes (Signed)
Pt pulled out IVs and says he is not being admitted.  Dr Rogene Houston informed.

## 2017-12-19 NOTE — ED Notes (Signed)
Pt given heart healthy tray with EDP approval.

## 2017-12-19 NOTE — ED Triage Notes (Addendum)
Pt was found by Education officer, museum at his home on the ground. Social worker for there for wellness check. Pt had been on the ground for > 8 hours. Family reports pt has been noncompliant with medications. Pt c/o generalized pain and bilateral pedal edema. EMS noted difficulty ambulating. AOx3.

## 2017-12-19 NOTE — ED Provider Notes (Signed)
[facility] EMERGENCY DEPARTMENT Provider Note   CSN: [id number] Arrival date & time: 12/19/17  1242     History   Chief Complaint Chief Complaint  Patient presents with  . Fall    HPI [name] is a 84 y.o. male.  Patient's past medical history is significant for diabetes hypertension aortic valve replacement coronary artery disease aortic stenosis.  And a history of iron deficiency anemia.  Patient's primary care doctor is Dr. Laurance Flatten.  Patient was brought in by EMS.  Patient was found by social worker at his home on the ground.  Social worker was there for a wellness check.  Patient's daughter last spoke to him around [place] last evening and he was fine then.  He has a history of falls when he does fall he is not able to get back up.  Patient has a history of forgetfulness.  EMS noted difficulty ambulating.  Patient was alert.  Patient's only complaint was bilateral leg swelling.  Patient's daughter states that this been there for a while and his doctors have been looking at it.     Past Medical History:  Diagnosis Date  . Anemia    Iron deficiency  . Aortic stenosis    s/p pericardial AVR  . Aortic valve disorder 06/23/2008   Qualifier: Diagnosis of  By: Mare Ferrari, RMA, Sherri    . Arthritis   . Benign prostatic hypertrophy   . CAD (coronary artery disease) 01/30/2011  . Carotid bruit 08/21/2016  . COLONIC POLYPS 06/23/2008   Qualifier: Diagnosis of  By: Mare Ferrari, RMA, Sherri    . Coronary artery disease    s/p CABG 08/2010    . Diabetes mellitus   . Dyslipidemia 10/15/2017  . Essential hypertension 08/21/2016  . H/O aortic valve replacement 08/21/2016  . Hyperlipidemia   . Hyperlipidemia LDL goal <70 06/23/2008   Qualifier: Diagnosis of  By: Mare Ferrari, RMA, Sherri    . Leg swelling 10/15/2017  . Orthostatic hypotension 06/22/2012  . Palpitations 08/21/2016  . Precordial pain 09/18/2011  . Type 2 diabetes mellitus with hyperlipidemia ([place]) 06/23/2008   Qualifier: Diagnosis of  By:  Mare Ferrari, RMA, Sherri    . Unstable angina ([place]) 07/24/2011    Patient Active Problem List   Diagnosis Date Noted  . Dyslipidemia 10/15/2017  . Leg swelling 10/15/2017  . Palpitations 08/21/2016  . Essential hypertension 08/21/2016  . Carotid bruit 08/21/2016  . H/O aortic valve replacement 08/21/2016  . Anemia, iron deficiency 04/11/2014  . Orthostatic hypotension 06/22/2012  . Dizzy 06/22/2012  . Precordial pain 09/18/2011  . Unstable angina ([place]) 07/24/2011  . CAD (coronary artery disease) 01/30/2011  . COLONIC POLYPS 06/23/2008  . Type 2 diabetes mellitus with hyperlipidemia ([place]) 06/23/2008  . Hyperlipidemia LDL goal <70 06/23/2008  . Aortic valve disorder 06/23/2008  . BENIGN PROSTATIC HYPERTROPHY, HX OF 06/23/2008    Past Surgical History:  Procedure Laterality Date  . AORTIC VALVE REPLACEMENT  08/24/10   70mm pericardial tissue valve  . CORONARY ARTERY BYPASS GRAFT  08/24/10   LIMA to LAD, SVG to ramus intermediate, SVG to diagonal  . HERNIA REPAIR    . knee replacements     x 2        Home Medications    Prior to Admission medications   Medication Sig Start Date End Date Taking? Authorizing Provider  aspirin 81 MG tablet Take 81 mg by mouth daily.     Yes [provider]  escitalopram (LEXAPRO) 20 MG tablet  Take 1 tablet (20 mg total) by mouth daily. 07/24/16  Yes Chipper Herb, MD  furosemide (LASIX) 20 MG tablet Take 1 tablet (20 mg total) by mouth daily. 11/19/17  Yes Minus Breeding, MD  lisinopril (PRINIVIL,ZESTRIL) 10 MG tablet Take 1 tablet (10 mg total) by mouth daily. 08/21/16  Yes Minus Breeding, MD  metFORMIN (GLUCOPHAGE) 500 MG tablet TAKE 1 TABLET (500 MG TOTAL) BY MOUTH 2 (TWO) TIMES DAILY. 07/16/17  Yes Chipper Herb, MD  metoprolol succinate (TOPROL-XL) 25 MG 24 hr tablet Take 1 tablet (25 mg total) by mouth daily. 08/21/16  Yes Minus Breeding, MD  nitroGLYCERIN (NITROSTAT) 0.4 MG SL tablet Place 1 tablet (0.4 mg total) under the tongue  every 5 (five) minutes x 3 doses as needed for chest pain. 08/21/16  Yes Minus Breeding, MD  rosuvastatin (CRESTOR) 40 MG tablet Take 1 tablet (40 mg total) by mouth daily. 10/15/17 01/13/18 Yes Minus Breeding, MD  ACCU-CHEK SOFTCLIX LANCETS lancets Use as instructed 09/15/15   Chipper Herb, MD  glucose blood (ACCU-CHEK AVIVA) test strip Test BS QD and PRN 11/23/15   Chipper Herb, MD    Family History Family History  Problem Relation Age of Onset  . Cancer Mother   . Diabetes Father   . Hypertension Other   . Diabetes Other     Social History Social History   Tobacco Use  . Smoking status: Former Smoker    Types: Cigars    Last attempt to quit: 09/17/1993    Years since quitting: 24.2  . Smokeless tobacco: Never Used  Substance Use Topics  . Alcohol use: Yes    Comment: occassional  . Drug use: No     Allergies   Patient has no known allergies.   Review of Systems Review of Systems  Constitutional: Negative for fever.  HENT: Negative for congestion.   Eyes: Negative for redness.  Respiratory: Negative for shortness of breath.   Cardiovascular: Positive for leg swelling. Negative for chest pain.  Gastrointestinal: Negative for abdominal pain, nausea and vomiting.  Genitourinary: Negative for dysuria and hematuria.  Musculoskeletal: Negative for back pain and neck pain.  Skin: Negative for rash and wound.  Neurological: Negative for syncope and headaches.  Hematological: Does not bruise/bleed easily.  Psychiatric/Behavioral: Negative for confusion.     Physical Exam Updated Vital Signs BP (!) 119/46   Pulse (!) 105   Temp 99 F (37.2 C) (Oral)   Resp (!) 24   Ht 1.829 m (6')   Wt 83.9 kg   SpO2 99%   BMI 25.09 kg/m   Physical Exam  Constitutional: He is oriented to person, place, and time. He appears well-developed and well-nourished. No distress.  HENT:  Head: Normocephalic and atraumatic.  Mucous membranes slightly dry  Eyes: Pupils are equal,  round, and reactive to light. Conjunctivae and EOM are normal.  Neck: Normal range of motion. Neck supple.  Cardiovascular: Normal rate, regular rhythm and normal heart sounds.  Pulmonary/Chest: Effort normal and breath sounds normal. No respiratory distress.  Abdominal: Soft. Bowel sounds are normal. He exhibits no distension. There is no tenderness.  Musculoskeletal: Normal range of motion. He exhibits no edema.  Neurological: He is alert and oriented to person, place, and time. No cranial nerve deficit or sensory deficit. He exhibits normal muscle tone. Coordination normal.  Skin: No rash noted. No erythema.  Nursing note and vitals reviewed.    ED Treatments / Results  Labs (all labs ordered  are listed, but only abnormal results are displayed) Labs Reviewed  BASIC METABOLIC PANEL - Abnormal; Notable for the following components:      Result Value   Sodium 134 (*)    Glucose, Bld 130 (*)    Calcium 8.7 (*)    All other components within normal limits  CBC - Abnormal; Notable for the following components:   RBC 3.38 (*)    Hemoglobin 7.9 (*)    HCT 26.2 (*)    MCV 77.5 (*)    MCH 23.4 (*)    All other components within normal limits  HEPATIC FUNCTION PANEL - Abnormal; Notable for the following components:   Albumin 3.2 (*)    Bilirubin, Direct 0.3 (*)    All other components within normal limits  CBG MONITORING, ED - Abnormal; Notable for the following components:   Glucose-Capillary 138 (*)    All other components within normal limits  URINALYSIS, ROUTINE W REFLEX MICROSCOPIC  LIPASE, BLOOD  BRAIN NATRIURETIC PEPTIDE  TROPONIN I  CK  CBG MONITORING, ED  TYPE AND SCREEN    EKG EKG Interpretation  Date/Time:  Friday December 19 2017 12:49:57 EDT Ventricular Rate:  107 PR Interval:    QRS Duration: 111 QT Interval:  352 QTC Calculation: 470 R Axis:   -15 Text Interpretation:  Sinus tachycardia Multiple premature complexes, vent & supraven Borderline left axis  deviation Probable anteroseptal infarct, old Confirmed by Fredia Sorrow [phone]) on 12/19/2017 1:51:59 PM   Radiology Ct Head Wo Contrast  Result Date: 12/19/2017 CLINICAL DATA:  Found at home on the ground. Altered mental status. Noncompliant with medications. Dementia. EXAM: CT HEAD WITHOUT CONTRAST TECHNIQUE: Contiguous axial images were obtained from the base of the skull through the vertex without intravenous contrast. COMPARISON:  03/28/2017. FINDINGS: Brain: No evidence for acute infarction, hemorrhage, mass lesion, or extra-axial fluid. Advanced atrophy. Suspected hydrocephalus ex vacuo. Normal pressure hydrocephalus is less likely. Chronic microvascular ischemic change. Vascular: Calcification of the cavernous internal carotid arteries, distal vertebral arteries, and basilar artery, consistent with cerebrovascular atherosclerotic disease. No signs of intracranial large vessel occlusion. Skull: Normal. Negative for fracture or focal lesion. Sinuses/Orbits: No acute finding. Other: Compared with the study in January 2019, similar appearance. IMPRESSION: Stable chronic findings most consistent with atrophy and small vessel disease. No acute intracranial abnormality. No intracranial hemorrhage or skull fracture. No signs of large vessel occlusion. Advanced cerebrovascular atherosclerosis. Electronically Signed   By: Staci Righter M.D.   On: 12/19/2017 16:49   Dg Chest Port 1 View  Result Date: 12/19/2017 CLINICAL DATA:  Patient found on floor by social worker today. History of hypertension. Former smoker. EXAM: PORTABLE CHEST 1 VIEW COMPARISON:  11/03/2017 FINDINGS: Stable changes from prior cardiac surgery. Cardiac silhouette is normal in size. No mediastinal or hilar masses. Clear lungs.  No pleural effusion or pneumothorax. Skeletal structures are grossly intact. IMPRESSION: No acute cardiopulmonary disease. Electronically Signed   By: Lajean Manes M.D.   On: 12/19/2017 14:13   Dg Hips Bilat W  Or Wo Pelvis 3-4 Views  Result Date: 12/19/2017 CLINICAL DATA:  Fall, found at home on ground, noncompliant with medications. EXAM: DG HIP (WITH OR WITHOUT PELVIS) 3-4V BILAT COMPARISON:  None. FINDINGS: There is no evidence of hip fracture or dislocation. There is no evidence of erosive arthropathy or other focal bone abnormality. BILATERAL joint space narrowing. Fecal impaction. Increased stool burden. Vascular calcification. IMPRESSION: Negative for hip fracture. BILATERAL DJD. Increased stool burden with fecal impaction. Electronically  Signed   By: Staci Righter M.D.   On: 12/19/2017 16:46    Procedures Procedures (including critical care time)  Medications Ordered in ED Medications  0.9 %  sodium chloride infusion ( Intravenous Stopped 12/19/17 1413)  sodium chloride 0.9 % bolus 500 mL ( Intravenous Stopped 12/19/17 1712)     Initial Impression / Assessment and Plan / ED Course  I have reviewed the triage vital signs and the nursing notes.  Pertinent labs & imaging results that were available during my care of the patient were reviewed by me and considered in my medical decision making (see chart for details).     Work-up for the fall include CT head without any acute findings.  Patient did not have any neck pain full range of motion.  Patient without any back pain.  However since he had difficulty getting up to do plain x-rays of pelvis and hips which were negative.  Did show some evidence of increased stool burden and may be some fecal impaction but patient without any complaint abdomen was flat.  Patient did have evidence of anemia with a hemoglobin of 7.9.  Recommended patient that he be admitted and have the hemoglobin not checked overnight.  Patient absolutely refused.  Patient's daughter was there.  Patient states he wanted to go home he will have his hemoglobin rechecked by his primary care doctor in a week or 2.  Otherwise work-up without any significant abnormalities.  Patient does  have past medical history deficiency anemia.  Hemoccult was not obtainable because patient did not want to have it done.   Final Clinical Impressions(s) / ED Diagnoses   Final diagnoses:  Fall, initial encounter  Anemia, unspecified type    ED Discharge Orders    None       Fredia Sorrow, MD 12/19/17 [phone]

## 2017-12-19 NOTE — ED Notes (Signed)
Daughter [name]) given personal belonging and wallet.

## 2017-12-19 NOTE — ED Notes (Signed)
Pt was informed that we need a urine sample. Pt states that he can not urinate at this time. 

## 2017-12-24 ENCOUNTER — Encounter: Payer: Self-pay | Admitting: Family Medicine

## 2017-12-24 ENCOUNTER — Ambulatory Visit (INDEPENDENT_AMBULATORY_CARE_PROVIDER_SITE_OTHER): Payer: Medicare HMO | Admitting: Family Medicine

## 2017-12-24 VITALS — BP 129/79 | HR 105 | Temp 98.0°F | Ht 72.0 in | Wt 183.0 lb

## 2017-12-24 DIAGNOSIS — R609 Edema, unspecified: Secondary | ICD-10-CM | POA: Diagnosis not present

## 2017-12-24 DIAGNOSIS — F329 Major depressive disorder, single episode, unspecified: Secondary | ICD-10-CM

## 2017-12-24 DIAGNOSIS — E1169 Type 2 diabetes mellitus with other specified complication: Secondary | ICD-10-CM

## 2017-12-24 DIAGNOSIS — E1151 Type 2 diabetes mellitus with diabetic peripheral angiopathy without gangrene: Secondary | ICD-10-CM | POA: Diagnosis not present

## 2017-12-24 DIAGNOSIS — D649 Anemia, unspecified: Secondary | ICD-10-CM | POA: Diagnosis not present

## 2017-12-24 DIAGNOSIS — F32A Depression, unspecified: Secondary | ICD-10-CM

## 2017-12-24 DIAGNOSIS — Z09 Encounter for follow-up examination after completed treatment for conditions other than malignant neoplasm: Secondary | ICD-10-CM | POA: Diagnosis not present

## 2017-12-24 DIAGNOSIS — I2510 Atherosclerotic heart disease of native coronary artery without angina pectoris: Secondary | ICD-10-CM

## 2017-12-24 DIAGNOSIS — R710 Precipitous drop in hematocrit: Secondary | ICD-10-CM | POA: Diagnosis not present

## 2017-12-24 DIAGNOSIS — I10 Essential (primary) hypertension: Secondary | ICD-10-CM | POA: Diagnosis not present

## 2017-12-24 DIAGNOSIS — R6889 Other general symptoms and signs: Secondary | ICD-10-CM | POA: Diagnosis not present

## 2017-12-24 DIAGNOSIS — E785 Hyperlipidemia, unspecified: Secondary | ICD-10-CM

## 2017-12-24 MED ORDER — LISINOPRIL 10 MG PO TABS: 10.0000 mg | ORAL_TABLET | Freq: Every day | ORAL | 3 refills | Status: DC

## 2017-12-24 MED ORDER — METOPROLOL SUCCINATE ER 25 MG PO TB24: 25.0000 mg | ORAL_TABLET | Freq: Every day | ORAL | 3 refills | Status: DC

## 2017-12-24 MED ORDER — ESCITALOPRAM OXALATE 20 MG PO TABS: 20.0000 mg | ORAL_TABLET | Freq: Every day | ORAL | 3 refills | Status: DC

## 2017-12-24 MED ORDER — FUROSEMIDE 20 MG PO TABS: 20.0000 mg | ORAL_TABLET | Freq: Every day | ORAL | 3 refills | Status: DC

## 2017-12-24 MED ORDER — ROSUVASTATIN CALCIUM 40 MG PO TABS: 40.0000 mg | ORAL_TABLET | Freq: Every day | ORAL | 3 refills | Status: DC

## 2017-12-24 MED ORDER — METFORMIN HCL 500 MG PO TABS: 500.0000 mg | ORAL_TABLET | Freq: Two times a day (BID) | ORAL | 3 refills | Status: DC

## 2017-12-24 NOTE — Progress Notes (Signed)
Subjective:    Patient ID: [name], male    DOB: 14-Aug-1932, 84 y.o.   MRN: [id number]  HPI Patient here today for hospital follow up from [facility] on 12/19/17. His primary diagnosis there was fall and anemia.  The patient did go to the emergency room and early August because of chest pain.  He was seen after this by Dr. Percival Spanish.  He did not make any changes in therapy.  He reviewed his previous studies and felt like that no change in therapy was necessary.  His blood pressure was good at that time.  On September 27 he did present to the emergency room following a fall.  He was taken by EMS.  He was found by a Education officer, museum at his home on the ground.  Lab work was done in the hospital and his hemoglobin was 7.9%.  The hematocrit was 26.2.  An EKG was done and this shows sinus tachycardia with premature complexes.  The patient was given some fluids.  Head CT had no acute findings.  Patient refused admission to the hospital.  He was recommended on discharge or when he left the hospital that he follow-up with a repeat CBC.  Hemoccult was not obtained because he left the hospital without getting this done.  The patient says that he has had black tarry bowel movements.  He has had more shortness of breath with activity.  The patient does say that he has black tarry bowel movements.  He does not have any abdominal pain he says he has not lost any weight.  He denies any trouble with swallowing or heartburn but does say that his bowel habits have changed.  He is passing his water without problems.   Patient Active Problem List   Diagnosis Date Noted  . Dyslipidemia 10/15/2017  . Leg swelling 10/15/2017  . Palpitations 08/21/2016  . Essential hypertension 08/21/2016  . Carotid bruit 08/21/2016  . H/O aortic valve replacement 08/21/2016  . Anemia, iron deficiency 04/11/2014  . Orthostatic hypotension 06/22/2012  . Dizzy 06/22/2012  . Precordial pain 09/18/2011  . Unstable angina ([place]) 07/24/2011    . CAD (coronary artery disease) 01/30/2011  . COLONIC POLYPS 06/23/2008  . Type 2 diabetes mellitus with hyperlipidemia ([place]) 06/23/2008  . Hyperlipidemia LDL goal <70 06/23/2008  . Aortic valve disorder 06/23/2008  . BENIGN PROSTATIC HYPERTROPHY, HX OF 06/23/2008   Outpatient Encounter Medications as of 12/24/2017  Medication Sig  . ACCU-CHEK SOFTCLIX LANCETS lancets Use as instructed  . aspirin 81 MG tablet Take 81 mg by mouth daily.    Marland Kitchen escitalopram (LEXAPRO) 20 MG tablet Take 1 tablet (20 mg total) by mouth daily.  . furosemide (LASIX) 20 MG tablet Take 1 tablet (20 mg total) by mouth daily.  Marland Kitchen glucose blood (ACCU-CHEK AVIVA) test strip Test BS QD and PRN  . lisinopril (PRINIVIL,ZESTRIL) 10 MG tablet Take 1 tablet (10 mg total) by mouth daily.  . metFORMIN (GLUCOPHAGE) 500 MG tablet TAKE 1 TABLET (500 MG TOTAL) BY MOUTH 2 (TWO) TIMES DAILY.  . metoprolol succinate (TOPROL-XL) 25 MG 24 hr tablet Take 1 tablet (25 mg total) by mouth daily.  . nitroGLYCERIN (NITROSTAT) 0.4 MG SL tablet Place 1 tablet (0.4 mg total) under the tongue every 5 (five) minutes x 3 doses as needed for chest pain.  . rosuvastatin (CRESTOR) 40 MG tablet Take 1 tablet (40 mg total) by mouth daily.   No facility-administered encounter medications on file as of 12/24/2017.  Review of Systems  Constitutional: Negative.   HENT: Negative.   Eyes: Negative.   Respiratory: Negative.   Cardiovascular: Negative.   Gastrointestinal: Negative.   Endocrine: Negative.   Genitourinary: Negative.   Musculoskeletal: Negative.   Skin: Negative.   Allergic/Immunologic: Negative.   Neurological: Negative.   Hematological: Negative.   Psychiatric/Behavioral: Negative.        Objective:   Physical Exam  Constitutional: He is oriented to person, place, and time. He appears well-developed and well-nourished. No distress.  The patient is kind and alert and comes to the visit today with his youngest daughter [name].   He reviewed why he left the hospital and I told him that things are not like they used to be and that he has to be patient with a work-up to see if he is needs to be admitted or not.  He seems to understand this this time.  HENT:  Head: Normocephalic and atraumatic.  Right Ear: External ear normal.  Left Ear: External ear normal.  Nose: Nose normal.  Mouth/Throat: Oropharynx is clear and moist. No oropharyngeal exudate.  Eyes: Pupils are equal, round, and reactive to light. Conjunctivae and EOM are normal. Right eye exhibits no discharge. Left eye exhibits no discharge. No scleral icterus.  Neck: Normal range of motion. Neck supple. No thyromegaly present.  No bruits thyromegaly or anterior cervical adenopathy  Cardiovascular: Normal rate, regular rhythm and normal heart sounds.  No murmur heard. The heart is regular at 84/min  Pulmonary/Chest: Effort normal and breath sounds normal. He has no wheezes. He has no rales.  Lungs were clear anteriorly and posteriorly  Abdominal: Soft. Bowel sounds are normal. He exhibits no mass. There is no tenderness. There is no guarding.  Musculoskeletal: Normal range of motion. He exhibits edema. He exhibits no tenderness.  There was 2+ pretibial edema bilaterally  Lymphadenopathy:    He has no cervical adenopathy.  Neurological: He is alert and oriented to person, place, and time. He has normal reflexes. No cranial nerve deficit.  Skin: Skin is warm and dry. No erythema.  Psychiatric: He has a normal mood and affect. His behavior is normal. Judgment and thought content normal.  The patient's mood affect and behavior were normal for him.  Nursing note and vitals reviewed.  BP (!) 143/80 (BP Location: Left Arm)   Pulse (!) 105   Temp 98 F (36.7 C) (Oral)   Ht 6' (1.829 m)   Wt 183 lb (83 kg)   BMI 24.82 kg/m         Assessment & Plan:  1. Hospital discharge follow-up -Patient continues to have falling episodes.  The patient's daughter said  he has had for episodes of falls over the past couple weeks.  It is usually mostly from sitting to standing. - BMP8+EGFR - CBC with Differential/Platelet - Ambulatory referral to Gastroenterology  2. Depression, unspecified depression type - escitalopram (LEXAPRO) 20 MG tablet; Take 1 tablet (20 mg total) by mouth daily.  Dispense: 90 tablet; Refill: 3  3. Anemia, unspecified type -Patient is having black tarry bowel movements and a change in bowel habits and his last hemoglobin was in the hospital at 7.9.  His color today is pale. - Ferritin - Iron and TIBC([facility]) - Vitamin B12 - Ambulatory referral to Gastroenterology  4. Decreased hemoglobin - Ambulatory referral to Gastroenterology  5. Essential hypertension -The pressure today was good.  6. Edema, unspecified type -This may very well be due to his low hemoglobin.  7. Coronary artery disease involving native coronary artery of native heart without angina pectoris -Recent cardiology visit was stable according to the record.  8. Type II diabetes mellitus with peripheral circulatory disorder ([place])  9. Type 2 diabetes mellitus with hyperlipidemia ([facility]) -Continue to check blood sugars closely at home.  Meds ordered this encounter  Medications  . escitalopram (LEXAPRO) 20 MG tablet    Sig: Take 1 tablet (20 mg total) by mouth daily.    Dispense:  90 tablet    Refill:  3  . rosuvastatin (CRESTOR) 40 MG tablet    Sig: Take 1 tablet (40 mg total) by mouth daily.    Dispense:  90 tablet    Refill:  3  . metoprolol succinate (TOPROL-XL) 25 MG 24 hr tablet    Sig: Take 1 tablet (25 mg total) by mouth daily.    Dispense:  90 tablet    Refill:  3  . metFORMIN (GLUCOPHAGE) 500 MG tablet    Sig: Take 1 tablet (500 mg total) by mouth 2 (two) times daily.    Dispense:  180 tablet    Refill:  3  . lisinopril (PRINIVIL,ZESTRIL) 10 MG tablet    Sig: Take 1 tablet (10 mg total) by mouth daily.    Dispense:  90 tablet     Refill:  3  . furosemide (LASIX) 20 MG tablet    Sig: Take 1 tablet (20 mg total) by mouth daily.    Dispense:  90 tablet    Refill:  3   Patient Instructions  We will arrange for you to see the gastroenterologist because of the black tarry bowel movements and the low hemoglobin that you have had.  We will repeat the CBC since you are in the office today and get a BMP.  We will also do iron studies.  We would ask that you return the FOBT even though we will be sending you to see the gastroenterologist. Wear support hose if possible  Arrie Senate MD

## 2017-12-24 NOTE — Patient Instructions (Addendum)
We will arrange for you to see the gastroenterologist because of the black tarry bowel movements and the low hemoglobin that you have had.  We will repeat the CBC since you are in the office today and get a BMP.  We will also do iron studies.  We would ask that you return the FOBT even though we will be sending you to see the gastroenterologist. Wear support hose if possible

## 2017-12-25 ENCOUNTER — Inpatient Hospital Stay (HOSPITAL_COMMUNITY)
Admission: EM | Admit: 2017-12-25 | Discharge: 2017-12-28 | DRG: 811 | Disposition: A | Discharge status: Home Health | Payer: Medicare HMO | Attending: Family Medicine | Admitting: Family Medicine

## 2017-12-25 ENCOUNTER — Other Ambulatory Visit: Payer: Self-pay

## 2017-12-25 ENCOUNTER — Telehealth: Payer: Self-pay | Admitting: Family Medicine

## 2017-12-25 ENCOUNTER — Encounter (HOSPITAL_COMMUNITY): Payer: Self-pay | Admitting: *Deleted

## 2017-12-25 ENCOUNTER — Other Ambulatory Visit: Payer: Self-pay | Admitting: *Deleted

## 2017-12-25 DIAGNOSIS — M1990 Unspecified osteoarthritis, unspecified site: Secondary | ICD-10-CM | POA: Diagnosis present

## 2017-12-25 DIAGNOSIS — K644 Residual hemorrhoidal skin tags: Secondary | ICD-10-CM | POA: Diagnosis present

## 2017-12-25 DIAGNOSIS — Z96659 Presence of unspecified artificial knee joint: Secondary | ICD-10-CM | POA: Diagnosis present

## 2017-12-25 DIAGNOSIS — I110 Hypertensive heart disease with heart failure: Secondary | ICD-10-CM | POA: Diagnosis present

## 2017-12-25 DIAGNOSIS — Z7984 Long term (current) use of oral hypoglycemic drugs: Secondary | ICD-10-CM

## 2017-12-25 DIAGNOSIS — K254 Chronic or unspecified gastric ulcer with hemorrhage: Secondary | ICD-10-CM | POA: Diagnosis present

## 2017-12-25 DIAGNOSIS — I5032 Chronic diastolic (congestive) heart failure: Secondary | ICD-10-CM | POA: Diagnosis present

## 2017-12-25 DIAGNOSIS — D509 Iron deficiency anemia, unspecified: Secondary | ICD-10-CM | POA: Diagnosis present

## 2017-12-25 DIAGNOSIS — F329 Major depressive disorder, single episode, unspecified: Secondary | ICD-10-CM | POA: Diagnosis present

## 2017-12-25 DIAGNOSIS — I6523 Occlusion and stenosis of bilateral carotid arteries: Secondary | ICD-10-CM | POA: Diagnosis not present

## 2017-12-25 DIAGNOSIS — Z8249 Family history of ischemic heart disease and other diseases of the circulatory system: Secondary | ICD-10-CM

## 2017-12-25 DIAGNOSIS — K264 Chronic or unspecified duodenal ulcer with hemorrhage: Secondary | ICD-10-CM | POA: Diagnosis present

## 2017-12-25 DIAGNOSIS — R402411 Glasgow coma scale score 13-15, in the field [EMT or ambulance]: Secondary | ICD-10-CM | POA: Diagnosis not present

## 2017-12-25 DIAGNOSIS — D649 Anemia, unspecified: Secondary | ICD-10-CM | POA: Diagnosis not present

## 2017-12-25 DIAGNOSIS — Z79899 Other long term (current) drug therapy: Secondary | ICD-10-CM | POA: Diagnosis not present

## 2017-12-25 DIAGNOSIS — Z953 Presence of xenogenic heart valve: Secondary | ICD-10-CM

## 2017-12-25 DIAGNOSIS — N400 Enlarged prostate without lower urinary tract symptoms: Secondary | ICD-10-CM | POA: Diagnosis present

## 2017-12-25 DIAGNOSIS — Z9181 History of falling: Secondary | ICD-10-CM

## 2017-12-25 DIAGNOSIS — K5731 Diverticulosis of large intestine without perforation or abscess with bleeding: Secondary | ICD-10-CM | POA: Diagnosis present

## 2017-12-25 DIAGNOSIS — D123 Benign neoplasm of transverse colon: Secondary | ICD-10-CM | POA: Diagnosis present

## 2017-12-25 DIAGNOSIS — E119 Type 2 diabetes mellitus without complications: Secondary | ICD-10-CM | POA: Diagnosis present

## 2017-12-25 DIAGNOSIS — Z7982 Long term (current) use of aspirin: Secondary | ICD-10-CM

## 2017-12-25 DIAGNOSIS — R55 Syncope and collapse: Secondary | ICD-10-CM

## 2017-12-25 DIAGNOSIS — Z951 Presence of aortocoronary bypass graft: Secondary | ICD-10-CM | POA: Diagnosis not present

## 2017-12-25 DIAGNOSIS — K317 Polyp of stomach and duodenum: Secondary | ICD-10-CM | POA: Diagnosis present

## 2017-12-25 DIAGNOSIS — R410 Disorientation, unspecified: Secondary | ICD-10-CM | POA: Diagnosis not present

## 2017-12-25 DIAGNOSIS — I2510 Atherosclerotic heart disease of native coronary artery without angina pectoris: Secondary | ICD-10-CM | POA: Diagnosis present

## 2017-12-25 DIAGNOSIS — Z87891 Personal history of nicotine dependence: Secondary | ICD-10-CM

## 2017-12-25 DIAGNOSIS — K5730 Diverticulosis of large intestine without perforation or abscess without bleeding: Secondary | ICD-10-CM | POA: Diagnosis not present

## 2017-12-25 DIAGNOSIS — W19XXXA Unspecified fall, initial encounter: Secondary | ICD-10-CM | POA: Diagnosis not present

## 2017-12-25 DIAGNOSIS — E785 Hyperlipidemia, unspecified: Secondary | ICD-10-CM | POA: Diagnosis present

## 2017-12-25 DIAGNOSIS — F32A Depression, unspecified: Secondary | ICD-10-CM

## 2017-12-25 DIAGNOSIS — R6510 Systemic inflammatory response syndrome (SIRS) of non-infectious origin without acute organ dysfunction: Secondary | ICD-10-CM | POA: Diagnosis present

## 2017-12-25 DIAGNOSIS — K449 Diaphragmatic hernia without obstruction or gangrene: Secondary | ICD-10-CM | POA: Diagnosis present

## 2017-12-25 DIAGNOSIS — I10 Essential (primary) hypertension: Secondary | ICD-10-CM | POA: Diagnosis present

## 2017-12-25 DIAGNOSIS — R0689 Other abnormalities of breathing: Secondary | ICD-10-CM | POA: Diagnosis not present

## 2017-12-25 DIAGNOSIS — R000 Tachycardia, unspecified: Secondary | ICD-10-CM | POA: Diagnosis not present

## 2017-12-25 DIAGNOSIS — K3189 Other diseases of stomach and duodenum: Secondary | ICD-10-CM | POA: Diagnosis not present

## 2017-12-25 DIAGNOSIS — Z833 Family history of diabetes mellitus: Secondary | ICD-10-CM

## 2017-12-25 DIAGNOSIS — K269 Duodenal ulcer, unspecified as acute or chronic, without hemorrhage or perforation: Secondary | ICD-10-CM | POA: Diagnosis not present

## 2017-12-25 DIAGNOSIS — E1169 Type 2 diabetes mellitus with other specified complication: Secondary | ICD-10-CM | POA: Diagnosis not present

## 2017-12-25 DIAGNOSIS — K2991 Gastroduodenitis, unspecified, with bleeding: Secondary | ICD-10-CM | POA: Diagnosis present

## 2017-12-25 DIAGNOSIS — I25709 Atherosclerosis of coronary artery bypass graft(s), unspecified, with unspecified angina pectoris: Secondary | ICD-10-CM | POA: Diagnosis not present

## 2017-12-25 LAB — URINALYSIS, ROUTINE W REFLEX MICROSCOPIC
Bilirubin Urine: NEGATIVE
GLUCOSE, UA: NEGATIVE mg/dL
Hgb urine dipstick: NEGATIVE
Ketones, ur: NEGATIVE mg/dL
LEUKOCYTES UA: NEGATIVE
NITRITE: NEGATIVE
PH: 7 (ref 5.0–8.0)
Protein, ur: NEGATIVE mg/dL
SPECIFIC GRAVITY, URINE: 1.009 (ref 1.005–1.030)

## 2017-12-25 LAB — CBC WITH DIFFERENTIAL/PLATELET
BASOS ABS: 0.1 10*3/uL (ref 0.0–0.2)
BASOS: 1 %
Basophils Absolute: 0 10*3/uL (ref 0.0–0.1)
Basophils Relative: 1 %
EOS (ABSOLUTE): 0.4 10*3/uL (ref 0.0–0.4)
EOS PCT: 4 %
Eos: 5 %
Eosinophils Absolute: 0.3 10*3/uL (ref 0.0–0.7)
HEMATOCRIT: 27.8 % — AB (ref 39.0–52.0)
Hematocrit: 27.9 % — ABNORMAL LOW (ref 37.5–51.0)
Hemoglobin: 8.5 g/dL — CL (ref 13.0–17.7)
Hemoglobin: 8.1 g/dL — ABNORMAL LOW (ref 13.0–17.0)
IMMATURE GRANULOCYTES: 0 %
Immature Grans (Abs): 0 10*3/uL (ref 0.0–0.1)
LYMPHS ABS: 1.2 10*3/uL (ref 0.7–3.1)
LYMPHS ABS: 1.2 10*3/uL (ref 0.7–4.0)
LYMPHS: 14 %
Lymphocytes Relative: 17 %
MCH: 22.6 pg — ABNORMAL LOW (ref 26.6–33.0)
MCH: 22.3 pg — AB (ref 26.0–34.0)
MCHC: 30.5 g/dL — AB (ref 31.5–35.7)
MCHC: 29.1 g/dL — ABNORMAL LOW (ref 30.0–36.0)
MCV: 74 fL — ABNORMAL LOW (ref 79–97)
MCV: 76.6 fL — AB (ref 78.0–100.0)
MONO ABS: 0.6 10*3/uL (ref 0.1–1.0)
Monocytes Absolute: 0.7 10*3/uL (ref 0.1–0.9)
Monocytes Relative: 8 %
Monocytes: 9 %
NEUTROS ABS: 6.2 10*3/uL (ref 1.4–7.0)
NEUTROS ABS: 5.1 10*3/uL (ref 1.7–7.7)
Neutrophils Relative %: 70 %
Neutrophils: 71 %
PLATELETS: 398 10*3/uL (ref 150–450)
PLATELETS: 350 10*3/uL (ref 150–400)
RBC: 3.76 x10E6/uL — AB (ref 4.14–5.80)
RBC: 3.63 MIL/uL — ABNORMAL LOW (ref 4.22–5.81)
RDW: 14.1 % (ref 12.3–15.4)
RDW: 14.8 % (ref 11.5–15.5)
WBC: 8.6 10*3/uL (ref 3.4–10.8)
WBC: 7.2 10*3/uL (ref 4.0–10.5)

## 2017-12-25 LAB — BASIC METABOLIC PANEL
Anion gap: 7 (ref 5–15)
BUN: 9 mg/dL (ref 8–23)
CALCIUM: 8.6 mg/dL — AB (ref 8.9–10.3)
CO2: 25 mmol/L (ref 22–32)
Chloride: 105 mmol/L (ref 98–111)
Creatinine, Ser: 0.83 mg/dL (ref 0.61–1.24)
GFR calc Af Amer: >60 mL/min (ref >60)
GFR calc non Af Amer: >60 mL/min (ref >60)
GLUCOSE: 142 mg/dL — AB (ref 70–99)
POTASSIUM: 3.7 mmol/L (ref 3.5–5.1)
Sodium: 137 mmol/L (ref 135–145)

## 2017-12-25 LAB — BMP8+EGFR
BUN / CREAT RATIO: 10 (ref 10–24)
BUN: 12 mg/dL (ref 8–27)
CHLORIDE: 101 mmol/L (ref 96–106)
CO2: 26 mmol/L (ref 20–29)
Calcium: 9.1 mg/dL (ref 8.6–10.2)
Creatinine, Ser: 1.24 mg/dL (ref 0.76–1.27)
GFR, EST AFRICAN AMERICAN: 62 mL/min/{1.73_m2} (ref >59)
GFR, EST NON AFRICAN AMERICAN: 53 mL/min/{1.73_m2} — AB (ref >59)
Glucose: 112 mg/dL — ABNORMAL HIGH (ref 65–99)
POTASSIUM: 4.5 mmol/L (ref 3.5–5.2)
Sodium: 139 mmol/L (ref 134–144)

## 2017-12-25 LAB — IRON AND TIBC
Iron Saturation: 4 % — CL (ref 15–55)
Iron: 13 ug/dL — ABNORMAL LOW (ref 38–169)
TIBC: 312 ug/dL (ref 250–450)
UIBC: 299 ug/dL (ref 111–343)

## 2017-12-25 LAB — FERRITIN: FERRITIN: 30 ng/mL (ref 30–400)

## 2017-12-25 LAB — GLUCOSE, CAPILLARY: Glucose-Capillary: 104 mg/dL — ABNORMAL HIGH (ref 70–99)

## 2017-12-25 LAB — CBG MONITORING, ED: Glucose-Capillary: 129 mg/dL — ABNORMAL HIGH (ref 70–99)

## 2017-12-25 LAB — VITAMIN B12: Vitamin B-12: 599 pg/mL (ref 232–1245)

## 2017-12-25 LAB — TROPONIN I: Troponin I: 0.03 ng/mL (ref <0.03)

## 2017-12-25 MED ORDER — ROSUVASTATIN CALCIUM 20 MG PO TABS
40.0000 mg | ORAL_TABLET | Freq: Every evening | ORAL | Status: DC
  Administered 2017-12-25 – 2017-12-27 (×3): 40 mg via ORAL
  Filled 2017-12-25 (×3): qty 2

## 2017-12-25 MED ORDER — METOPROLOL SUCCINATE ER 25 MG PO TB24: 25.0000 mg | ORAL_TABLET | ORAL | 11 refills | Status: DC

## 2017-12-25 MED ORDER — METFORMIN HCL 500 MG PO TABS: 500.0000 mg | ORAL_TABLET | Freq: Two times a day (BID) | ORAL | 11 refills | Status: DC

## 2017-12-25 MED ORDER — ASPIRIN 81 MG PO TABS: 81.0000 mg | ORAL_TABLET | ORAL | 11 refills | Status: DC

## 2017-12-25 MED ORDER — INSULIN ASPART 100 UNIT/ML ~~LOC~~ SOLN
0.0000 [IU] | Freq: Three times a day (TID) | SUBCUTANEOUS | Status: DC
  Administered 2017-12-26: 3 [IU] via SUBCUTANEOUS
  Administered 2017-12-26: 1 [IU] via SUBCUTANEOUS

## 2017-12-25 MED ORDER — METOPROLOL SUCCINATE ER 25 MG PO TB24
25.0000 mg | ORAL_TABLET | Freq: Every day | ORAL | Status: DC
  Administered 2017-12-26 – 2017-12-27 (×2): 25 mg via ORAL
  Filled 2017-12-25 (×3): qty 1

## 2017-12-25 MED ORDER — ESCITALOPRAM OXALATE 10 MG PO TABS
20.0000 mg | ORAL_TABLET | Freq: Every evening | ORAL | Status: DC
  Administered 2017-12-25 – 2017-12-26 (×2): 20 mg via ORAL
  Filled 2017-12-25 (×2): qty 2

## 2017-12-25 MED ORDER — ESCITALOPRAM OXALATE 20 MG PO TABS: 20.0000 mg | ORAL_TABLET | Freq: Every evening | ORAL | 11 refills | Status: DC

## 2017-12-25 MED ORDER — FUROSEMIDE 20 MG PO TABS: 20.0000 mg | ORAL_TABLET | ORAL | 11 refills | Status: DC

## 2017-12-25 MED ORDER — LISINOPRIL 10 MG PO TABS: 10.0000 mg | ORAL_TABLET | ORAL | 11 refills | Status: DC

## 2017-12-25 MED ORDER — ROSUVASTATIN CALCIUM 40 MG PO TABS: 40.0000 mg | ORAL_TABLET | Freq: Every evening | ORAL | 11 refills | Status: DC

## 2017-12-25 MED ORDER — FERROUS SULFATE 325 (65 FE) MG PO TABS
325.0000 mg | ORAL_TABLET | Freq: Every day | ORAL | Status: DC
  Administered 2017-12-26: 325 mg via ORAL
  Filled 2017-12-25: qty 1

## 2017-12-25 NOTE — Telephone Encounter (Signed)
Pt had another fall - heat was on 80 degrees, very confused

## 2017-12-25 NOTE — Telephone Encounter (Signed)
Hopefully the patient's visit to the emergency room following this new syncopal episode will help with working out his visit to the gastroenterologist because of his low hemoglobin.  Please make sure that the gastroenterologist is aware of this hospital visit and especially if he is kept For observation.

## 2017-12-25 NOTE — ED Provider Notes (Signed)
[facility] EMERGENCY DEPARTMENT Provider Note   CSN: [id number] Arrival date & time: 12/25/17  1609     History   Chief Complaint Chief Complaint  Patient presents with  . Altered Mental Status    HPI [name] is a 84 y.o. male.  HPI Patient presents after being found on the floor.  Reportedly fell after being outside.  States was unable to get up.  States he thinks he could have gotten up if he tried but he did not try.  Patient states he is been taking his medicine but reportedly patient's family said he has not.  Saw his primary care doctor yesterday.  Had been seen in the ER recently after a fall and diagnosed with anemia.  Patient was not willing to be admitted at that time.  Denies chest pain.  Denies any confusion. Past Medical History:  Diagnosis Date  . Anemia    Iron deficiency  . Aortic stenosis    s/p pericardial AVR  . Aortic valve disorder 06/23/2008   Qualifier: Diagnosis of  By: Mare Ferrari, RMA, Sherri    . Arthritis   . Benign prostatic hypertrophy   . CAD (coronary artery disease) 01/30/2011  . Carotid bruit 08/21/2016  . COLONIC POLYPS 06/23/2008   Qualifier: Diagnosis of  By: Mare Ferrari, RMA, Sherri    . Coronary artery disease    s/p CABG 08/2010    . Diabetes mellitus   . Dyslipidemia 10/15/2017  . Essential hypertension 08/21/2016  . H/O aortic valve replacement 08/21/2016  . Hyperlipidemia   . Hyperlipidemia LDL goal <70 06/23/2008   Qualifier: Diagnosis of  By: Mare Ferrari, RMA, Sherri    . Leg swelling 10/15/2017  . Orthostatic hypotension 06/22/2012  . Palpitations 08/21/2016  . Precordial pain 09/18/2011  . Type 2 diabetes mellitus with hyperlipidemia ([place]) 06/23/2008   Qualifier: Diagnosis of  By: Mare Ferrari, RMA, Sherri    . Unstable angina ([place]) 07/24/2011    Patient Active Problem List   Diagnosis Date Noted  . Dyslipidemia 10/15/2017  . Leg swelling 10/15/2017  . Palpitations 08/21/2016  . Essential hypertension 08/21/2016  . Carotid bruit 08/21/2016    . H/O aortic valve replacement 08/21/2016  . Anemia, iron deficiency 04/11/2014  . Orthostatic hypotension 06/22/2012  . Dizzy 06/22/2012  . Precordial pain 09/18/2011  . Unstable angina ([place]) 07/24/2011  . CAD (coronary artery disease) 01/30/2011  . COLONIC POLYPS 06/23/2008  . Type 2 diabetes mellitus with hyperlipidemia ([place]) 06/23/2008  . Hyperlipidemia LDL goal <70 06/23/2008  . Aortic valve disorder 06/23/2008  . BENIGN PROSTATIC HYPERTROPHY, HX OF 06/23/2008    Past Surgical History:  Procedure Laterality Date  . AORTIC VALVE REPLACEMENT  08/24/10   54mm pericardial tissue valve  . CORONARY ARTERY BYPASS GRAFT  08/24/10   LIMA to LAD, SVG to ramus intermediate, SVG to diagonal  . HERNIA REPAIR    . knee replacements     x 2        Home Medications    Prior to Admission medications   Medication Sig Start Date End Date Taking? Authorizing Provider  aspirin 81 MG tablet Take 1 tablet (81 mg total) by mouth every morning. 12/25/17  Yes Chipper Herb, MD  escitalopram (LEXAPRO) 20 MG tablet Take 1 tablet (20 mg total) by mouth every evening. 12/25/17  Yes Chipper Herb, MD  furosemide (LASIX) 20 MG tablet Take 1 tablet (20 mg total) by mouth every morning. 12/25/17  Yes Chipper Herb, MD  lisinopril (PRINIVIL,ZESTRIL) 10 MG tablet Take 1 tablet (10 mg total) by mouth every morning. 12/25/17  Yes Chipper Herb, MD  metFORMIN (GLUCOPHAGE) 500 MG tablet Take 1 tablet (500 mg total) by mouth 2 (two) times daily. 12/25/17  Yes Chipper Herb, MD  metoprolol succinate (TOPROL-XL) 25 MG 24 hr tablet Take 1 tablet (25 mg total) by mouth every morning. 12/25/17  Yes Chipper Herb, MD  nitroGLYCERIN (NITROSTAT) 0.4 MG SL tablet Place 1 tablet (0.4 mg total) under the tongue every 5 (five) minutes x 3 doses as needed for chest pain. 08/21/16  Yes Minus Breeding, MD  rosuvastatin (CRESTOR) 40 MG tablet Take 1 tablet (40 mg total) by mouth every evening. 12/25/17 03/25/18 Yes Chipper Herb, MD  ACCU-CHEK SOFTCLIX LANCETS lancets Use as instructed 09/15/15   Chipper Herb, MD  glucose blood (ACCU-CHEK AVIVA) test strip Test BS QD and PRN 11/23/15   Chipper Herb, MD    Family History Family History  Problem Relation Age of Onset  . Cancer Mother   . Diabetes Father   . Hypertension Other   . Diabetes Other     Social History Social History   Tobacco Use  . Smoking status: Former Smoker    Types: Cigars    Last attempt to quit: 09/17/1993    Years since quitting: 24.2  . Smokeless tobacco: Never Used  Substance Use Topics  . Alcohol use: Yes    Comment: occassional  . Drug use: No     Allergies   Patient has no known allergies.   Review of Systems Review of Systems  Constitutional: Negative for appetite change and fatigue.  Respiratory: Negative for shortness of breath.   Cardiovascular: Negative for chest pain.  Gastrointestinal: Negative for abdominal pain.  Genitourinary: Negative for flank pain.  Musculoskeletal: Negative for back pain.  Skin: Negative for rash.  Neurological: Positive for dizziness. Negative for weakness.     Physical Exam Updated Vital Signs BP (!) 151/76 (BP Location: Right Arm)   Pulse 93   Temp 98.9 F (37.2 C) (Oral)   Resp 14   Ht 5\' 9"  (1.753 m)   Wt 81.6 kg   SpO2 100%   BMI 26.58 kg/m   Physical Exam  Constitutional: He appears well-developed.  HENT:  Head: Normocephalic.  Eyes: EOM are normal.  Neck: Neck supple.  Cardiovascular:  Murmur heard. Pulmonary/Chest: Effort normal.  Abdominal: There is no tenderness.  Musculoskeletal: He exhibits edema.  Neurological: He is alert.  Skin: Capillary refill takes less than 2 seconds. There is pallor.     ED Treatments / Results  Labs (all labs ordered are listed, but only abnormal results are displayed) Labs Reviewed  CBC WITH DIFFERENTIAL/PLATELET - Abnormal; Notable for the following components:      Result Value   RBC 3.63 (*)     Hemoglobin 8.1 (*)    HCT 27.8 (*)    MCV 76.6 (*)    MCH 22.3 (*)    MCHC 29.1 (*)    All other components within normal limits  BASIC METABOLIC PANEL - Abnormal; Notable for the following components:   Glucose, Bld 142 (*)    Calcium 8.6 (*)    All other components within normal limits  TROPONIN I - Abnormal; Notable for the following components:   Troponin I 0.03 (*)    All other components within normal limits  CBG MONITORING, ED - Abnormal; Notable for the following components:  Glucose-Capillary 129 (*)    All other components within normal limits  URINALYSIS, ROUTINE W REFLEX MICROSCOPIC    EKG None  Radiology No results found.  Procedures Procedures (including critical care time)  Medications Ordered in ED Medications - No data to display   Initial Impression / Assessment and Plan / ED Course  I have reviewed the triage vital signs and the nursing notes.  Pertinent labs & imaging results that were available during my care of the patient were reviewed by me and considered in my medical decision making (see chart for details).     Patient presents with another fall may be near syncope.  Symptomatic anemia.  Has been seen by GI with plans of outpatient work-up.  Hemoglobin has been 7.9 at the lowest and up to 8.5 now down to 8.1 again.  I think the fact that he is been having more difficulty standing and dizziness he is symptomatic with this would benefit from transfusion.  Will admit to hospitalist.  Patient has previously refused guaiac.  CRITICAL CARE Performed by: Davonna Belling Total critical care time: 30 minutes Critical care time was exclusive of separately billable procedures and treating other patients. Critical care was necessary to treat or prevent imminent or life-threatening deterioration. Critical care was time spent personally by me on the following activities: development of treatment plan with patient and/or surrogate as well as nursing,  discussions with consultants, evaluation of patient's response to treatment, examination of patient, obtaining history from patient or surrogate, ordering and performing treatments and interventions, ordering and review of laboratory studies, ordering and review of radiographic studies, pulse oximetry and re-evaluation of patient's condition.   Final Clinical Impressions(s) / ED Diagnoses   Final diagnoses:  Symptomatic anemia    ED Discharge Orders    None       Davonna Belling, MD 12/25/17 1840

## 2017-12-25 NOTE — ED Notes (Signed)
Social worker [place] [phone] ext [phone])  aware of patient status.  Social worker  able to be in contact with patient granddaughter [name] [phone])

## 2017-12-25 NOTE — ED Notes (Signed)
Date and time results received: 12/25/17 1738 (use smartphrase ".now" to insert current time)  Test: Troponin Critical Value: 0.03  Name of Provider Notified: Dr. Alvino Chapel  Orders Received? Or Actions Taken?: none at this time.

## 2017-12-25 NOTE — H&P (Addendum)
History and Physical    [name] WGN:[id number] DOB: [birth year] DOA: 12/25/2017  PCP: Chipper Herb, MD Patient coming from: Home  Chief Complaint: Lightheadedness  HPI: [name] is a 84 y.o. male with medical history significant of type II diabetes, hypertension, hyperlipidemia, coronary artery disease, history of aortic valve replacement, BPH being brought into the hospital after being found on the floor.  Patient does not recall falling.  States he remembers feeling lightheaded when walking this morning and that the fire department brought him to the hospital.  Denies having any chest pain or shortness of breath.  Denies having any abdominal pain, hematochezia, or melena at present.  Does report noticing dark stools 2 to 3 months ago.  He is not sure if he has had a colonoscopy before.  Denies noticing any change in his appetite but does report losing 8 pounds in the past 3 months.  Per chart review, patient was seen in the ED on December 19, 2017 for a fall.  At that ED visit, chest x-ray with no acute cardiopulmonary disease.  CT head was negative.  X-rays of hip and pelvis bilateral with evidence of degenerative joint disease but no fracture. He was found to have a hemoglobin of 7.9.  It was recommended that the patient be admitted but he refused at that time.  ED Course: Vitals on arrival-afebrile, pulse 93, respiratory rate 14, blood pressure 151/76, and SPO2 100% on room air.  Labs showing hemoglobin 8.1. Troponin 0.03.  UA not suggestive of infection.  Review of Systems: As per HPI otherwise 10 point review of systems negative.    Past Medical History:  Diagnosis Date  . Anemia    Iron deficiency  . Aortic stenosis    s/p pericardial AVR  . Aortic valve disorder 06/23/2008   Qualifier: Diagnosis of  By: Mare Ferrari, RMA, Sherri    . Arthritis   . Benign prostatic hypertrophy   . CAD (coronary artery disease) 01/30/2011  . Carotid bruit 08/21/2016  . COLONIC POLYPS 06/23/2008     Qualifier: Diagnosis of  By: Mare Ferrari, RMA, Sherri    . Coronary artery disease    s/p CABG 08/2010    . Diabetes mellitus   . Dyslipidemia 10/15/2017  . Essential hypertension 08/21/2016  . H/O aortic valve replacement 08/21/2016  . Hyperlipidemia   . Hyperlipidemia LDL goal <70 06/23/2008   Qualifier: Diagnosis of  By: Mare Ferrari, RMA, Sherri    . Leg swelling 10/15/2017  . Orthostatic hypotension 06/22/2012  . Palpitations 08/21/2016  . Precordial pain 09/18/2011  . Type 2 diabetes mellitus with hyperlipidemia ([place]) 06/23/2008   Qualifier: Diagnosis of  By: Mare Ferrari, RMA, Sherri    . Unstable angina ([place]) 07/24/2011    Past Surgical History:  Procedure Laterality Date  . AORTIC VALVE REPLACEMENT  08/24/10   97mm pericardial tissue valve  . CORONARY ARTERY BYPASS GRAFT  08/24/10   LIMA to LAD, SVG to ramus intermediate, SVG to diagonal  . HERNIA REPAIR    . knee replacements     x 2   Social history  reports that he quit smoking about 24 years ago. His smoking use included cigars. He has never used smokeless tobacco. He reports that he drinks alcohol. He reports that he does not use drugs.  No Known Allergies  Family History  Problem Relation Age of Onset  . Cancer Mother   . Diabetes Father   . Hypertension Other   . Diabetes Other  Prior to Admission medications   Medication Sig Start Date End Date Taking? Authorizing Provider  aspirin 81 MG tablet Take 1 tablet (81 mg total) by mouth every morning. 12/25/17  Yes Chipper Herb, MD  escitalopram (LEXAPRO) 20 MG tablet Take 1 tablet (20 mg total) by mouth every evening. 12/25/17  Yes Chipper Herb, MD  furosemide (LASIX) 20 MG tablet Take 1 tablet (20 mg total) by mouth every morning. 12/25/17  Yes Chipper Herb, MD  lisinopril (PRINIVIL,ZESTRIL) 10 MG tablet Take 1 tablet (10 mg total) by mouth every morning. 12/25/17  Yes Chipper Herb, MD  metFORMIN (GLUCOPHAGE) 500 MG tablet Take 1 tablet (500 mg total) by mouth 2 (two)  times daily. 12/25/17  Yes Chipper Herb, MD  metoprolol succinate (TOPROL-XL) 25 MG 24 hr tablet Take 1 tablet (25 mg total) by mouth every morning. 12/25/17  Yes Chipper Herb, MD  nitroGLYCERIN (NITROSTAT) 0.4 MG SL tablet Place 1 tablet (0.4 mg total) under the tongue every 5 (five) minutes x 3 doses as needed for chest pain. 08/21/16  Yes Minus Breeding, MD  rosuvastatin (CRESTOR) 40 MG tablet Take 1 tablet (40 mg total) by mouth every evening. 12/25/17 03/25/18 Yes Chipper Herb, MD  ACCU-CHEK SOFTCLIX LANCETS lancets Use as instructed 09/15/15   Chipper Herb, MD  glucose blood (ACCU-CHEK AVIVA) test strip Test BS QD and PRN 11/23/15   Chipper Herb, MD    Physical Exam: Vitals:   12/25/17 1615 12/25/17 1730 12/25/17 1830 12/25/17 1942  BP:    129/79  Pulse: 97 96 95 95  Resp: (!) 22 (!) 25 18   Temp:    97.7 F (36.5 C)  TempSrc:    Oral  SpO2: 91% 100% 100% 98%  Weight:      Height:        Constitutional: NAD, calm, comfortable Vitals:   12/25/17 1615 12/25/17 1730 12/25/17 1830 12/25/17 1942  BP:    129/79  Pulse: 97 96 95 95  Resp: (!) 22 (!) 25 18   Temp:    97.7 F (36.5 C)  TempSrc:    Oral  SpO2: 91% 100% 100% 98%  Weight:      Height:       Physical Exam  Constitutional: He is oriented to person, place, and time. No distress.  HENT:  Mouth/Throat: Oropharynx is clear and moist.  Eyes: Right eye exhibits no discharge. Left eye exhibits no discharge.  Neck: Neck supple. No tracheal deviation present.  Cardiovascular: Normal rate, regular rhythm and intact distal pulses.  Pulmonary/Chest: Effort normal and breath sounds normal. No respiratory distress. He has no wheezes. He has no rales.  Abdominal: Soft. Bowel sounds are normal. He exhibits no distension. There is no tenderness.  Musculoskeletal:  Trace pedal edema  Neurological: He is alert and oriented to person, place, and time.  Skin: Skin is warm and dry.  Psychiatric: He has a normal mood and  affect.     Labs on Admission: I have personally reviewed following labs and imaging studies  CBC: Recent Labs  Lab 12/19/17 1258 12/24/17 1240 12/25/17 1649  WBC 10.0 8.6 7.2  NEUTROABS  --  6.2 5.1  HGB 7.9* 8.5* 8.1*  HCT 26.2* 27.9* 27.8*  MCV 77.5* 74* 76.6*  PLT 284 398 284   Basic Metabolic Panel: Recent Labs  Lab 12/19/17 1258 12/24/17 1240 12/25/17 1649  NA 134* 139 137  K 4.2 4.5 3.7  CL  102 101 105  CO2 24 26 25   GLUCOSE 130* 112* 142*  BUN 14 12 9   CREATININE 1.05 1.24 0.83  CALCIUM 8.7* 9.1 8.6*   GFR: Estimated Creatinine Clearance: 67.4 mL/min (by C-G formula based on SCr of 0.83 mg/dL). Liver Function Tests: Recent Labs  Lab 12/19/17 1350  AST 37  ALT 23  ALKPHOS 93  BILITOT 1.0  PROT 7.3  ALBUMIN 3.2*   Recent Labs  Lab 12/19/17 1350  LIPASE 37   No results for input(s): AMMONIA in the last 168 hours. Coagulation Profile: No results for input(s): INR, PROTIME in the last 168 hours. Cardiac Enzymes: Recent Labs  Lab 12/19/17 1350 12/25/17 1649  CKTOTAL 92  --   TROPONINI <0.03 0.03*   BNP (last 3 results) No results for input(s): PROBNP in the last 8760 hours. HbA1C: No results for input(s): HGBA1C in the last 72 hours. CBG: Recent Labs  Lab 12/19/17 1245 12/25/17 1702  GLUCAP 138* 129*   Lipid Profile: No results for input(s): CHOL, HDL, LDLCALC, TRIG, CHOLHDL, LDLDIRECT in the last 72 hours. Thyroid Function Tests: No results for input(s): TSH, T4TOTAL, FREET4, T3FREE, THYROIDAB in the last 72 hours. Anemia Panel: Recent Labs    12/24/17 1240  VITAMINB12 599  FERRITIN 30  TIBC 312  IRON 13*   Urine analysis:    Component Value Date/Time   COLORURINE YELLOW 12/25/2017 1923   APPEARANCEUR CLEAR 12/25/2017 1923   APPEARANCEUR Clear 07/24/2016 1110   LABSPEC 1.009 12/25/2017 1923   PHURINE 7.0 12/25/2017 1923   GLUCOSEU NEGATIVE 12/25/2017 [place] NEGATIVE 12/25/2017 [place] NEGATIVE  12/25/2017 1923   BILIRUBINUR Negative 07/24/2016 [place] 12/25/2017 1923   PROTEINUR NEGATIVE 12/25/2017 1923   UROBILINOGEN negative 08/23/2014 1030   UROBILINOGEN 0.2 08/21/2010 1150   NITRITE NEGATIVE 12/25/2017 1923   LEUKOCYTESUR NEGATIVE 12/25/2017 1923   LEUKOCYTESUR Negative 07/24/2016 1110    Radiological Exams on Admission: No results found.  EKG: Pending  Assessment/Plan Active Problems:   Symptomatic anemia  Symptomatic anemia Feeling lightheaded at home.  Was found down on the floor (? syncope).  Hemodynamically stable.  Hemoglobin 8.1 and MCV 76.  Hgb 10.0 in May 2019.  Outpatient labs done yesterday showing hemoglobin 8.5, ferritin 30 and low iron saturation of 4% consistent with iron deficiency anemia.  Patient denies any hematochezia or melena at present but does state he had dark stools 2 to 3 months ago.  No prior colonoscopy in chart.  He was referred to GI has not been able to see them as he returned to the hospital today.  Concern for possible GI source of blood loss. -Type and screen -Transfuse 1 unit of packed red blood cells for symptomatic anemia -Ferrous sulfate 325 mg daily -CBC in a.m. -Check FOBT.  Discussed with the patient and he agreed.  If negative and patient stable, he will likely need to follow-up with GI on an outpatient basis. -Check orthostatics  History of diastolic congestive heart failure Stable.  Not currently volume overloaded.  Echo done in July 2019 showing EF 65 to 70, grade 1 diastolic dysfunction, and aortic valve bioprosthesis. -Continue home metoprolol  History of coronary artery disease Patient is not complaining of chest pain. -Troponin 0.03 likely due to demand ischemia.  EKG pending.  Depression -Continue home Lexapro  Well-controlled type 2 diabetes A1c 5.8 in May 2019. -Sliding scale insulin sensitive -CBG checks  Hypertension Currently normotensive. -Continue home  metoprolol  Hyperlipidemia -Continue home Crestor  Diet: Carb modified  DVT prophylaxis: SCDs Code Status: Patient wishes to be full code. Family Communication: No family present at bedside. Disposition Plan: Anticipate discharge in 1 to 2 days Consults called: None Admission status: Observation   Shela Leff MD [facility] Pager [phone]  If 7PM-7AM, please contact night-coverage [email or web address] Password [email or web address]  12/25/2017, 8:16 PM

## 2017-12-25 NOTE — ED Notes (Signed)
Pt urinated all over bed, linens and gown changed, urinal placed between legs if pt urinates

## 2017-12-25 NOTE — ED Triage Notes (Signed)
Family member found him on the floor of his home "scooting on the floor".  Confusion and unable to get up from the floor. Patient stated he did not fall, but had sit down.  CBG 158 per EMS.  Patient family stated patient has not been compliant with any medications.

## 2017-12-26 ENCOUNTER — Observation Stay (HOSPITAL_COMMUNITY): Payer: Medicare HMO

## 2017-12-26 ENCOUNTER — Encounter (HOSPITAL_COMMUNITY): Payer: Self-pay | Admitting: Family Medicine

## 2017-12-26 ENCOUNTER — Other Ambulatory Visit: Payer: Self-pay | Admitting: *Deleted

## 2017-12-26 DIAGNOSIS — D649 Anemia, unspecified: Secondary | ICD-10-CM

## 2017-12-26 DIAGNOSIS — K254 Chronic or unspecified gastric ulcer with hemorrhage: Secondary | ICD-10-CM | POA: Diagnosis present

## 2017-12-26 DIAGNOSIS — Z9181 History of falling: Secondary | ICD-10-CM | POA: Diagnosis not present

## 2017-12-26 DIAGNOSIS — Z7982 Long term (current) use of aspirin: Secondary | ICD-10-CM | POA: Diagnosis not present

## 2017-12-26 DIAGNOSIS — F329 Major depressive disorder, single episode, unspecified: Secondary | ICD-10-CM

## 2017-12-26 DIAGNOSIS — R55 Syncope and collapse: Secondary | ICD-10-CM | POA: Diagnosis not present

## 2017-12-26 DIAGNOSIS — D509 Iron deficiency anemia, unspecified: Secondary | ICD-10-CM | POA: Diagnosis present

## 2017-12-26 DIAGNOSIS — E119 Type 2 diabetes mellitus without complications: Secondary | ICD-10-CM | POA: Diagnosis present

## 2017-12-26 DIAGNOSIS — K5731 Diverticulosis of large intestine without perforation or abscess with bleeding: Secondary | ICD-10-CM | POA: Diagnosis present

## 2017-12-26 DIAGNOSIS — Z96659 Presence of unspecified artificial knee joint: Secondary | ICD-10-CM | POA: Diagnosis present

## 2017-12-26 DIAGNOSIS — I25709 Atherosclerosis of coronary artery bypass graft(s), unspecified, with unspecified angina pectoris: Secondary | ICD-10-CM

## 2017-12-26 DIAGNOSIS — K264 Chronic or unspecified duodenal ulcer with hemorrhage: Secondary | ICD-10-CM | POA: Diagnosis present

## 2017-12-26 DIAGNOSIS — Z951 Presence of aortocoronary bypass graft: Secondary | ICD-10-CM | POA: Diagnosis not present

## 2017-12-26 DIAGNOSIS — I2510 Atherosclerotic heart disease of native coronary artery without angina pectoris: Secondary | ICD-10-CM | POA: Diagnosis present

## 2017-12-26 DIAGNOSIS — W19XXXA Unspecified fall, initial encounter: Secondary | ICD-10-CM

## 2017-12-26 DIAGNOSIS — N400 Enlarged prostate without lower urinary tract symptoms: Secondary | ICD-10-CM | POA: Diagnosis present

## 2017-12-26 DIAGNOSIS — E785 Hyperlipidemia, unspecified: Secondary | ICD-10-CM | POA: Diagnosis present

## 2017-12-26 DIAGNOSIS — Z953 Presence of xenogenic heart valve: Secondary | ICD-10-CM | POA: Diagnosis not present

## 2017-12-26 DIAGNOSIS — R6510 Systemic inflammatory response syndrome (SIRS) of non-infectious origin without acute organ dysfunction: Secondary | ICD-10-CM | POA: Diagnosis present

## 2017-12-26 DIAGNOSIS — I5032 Chronic diastolic (congestive) heart failure: Secondary | ICD-10-CM | POA: Diagnosis present

## 2017-12-26 DIAGNOSIS — K3189 Other diseases of stomach and duodenum: Secondary | ICD-10-CM | POA: Diagnosis not present

## 2017-12-26 DIAGNOSIS — K449 Diaphragmatic hernia without obstruction or gangrene: Secondary | ICD-10-CM | POA: Diagnosis present

## 2017-12-26 DIAGNOSIS — M1990 Unspecified osteoarthritis, unspecified site: Secondary | ICD-10-CM | POA: Diagnosis present

## 2017-12-26 DIAGNOSIS — I10 Essential (primary) hypertension: Secondary | ICD-10-CM

## 2017-12-26 DIAGNOSIS — I6523 Occlusion and stenosis of bilateral carotid arteries: Secondary | ICD-10-CM | POA: Diagnosis not present

## 2017-12-26 DIAGNOSIS — E1169 Type 2 diabetes mellitus with other specified complication: Secondary | ICD-10-CM

## 2017-12-26 DIAGNOSIS — K5730 Diverticulosis of large intestine without perforation or abscess without bleeding: Secondary | ICD-10-CM | POA: Diagnosis not present

## 2017-12-26 DIAGNOSIS — D123 Benign neoplasm of transverse colon: Secondary | ICD-10-CM | POA: Diagnosis present

## 2017-12-26 DIAGNOSIS — K2991 Gastroduodenitis, unspecified, with bleeding: Secondary | ICD-10-CM | POA: Diagnosis present

## 2017-12-26 DIAGNOSIS — I110 Hypertensive heart disease with heart failure: Secondary | ICD-10-CM | POA: Diagnosis present

## 2017-12-26 DIAGNOSIS — K317 Polyp of stomach and duodenum: Secondary | ICD-10-CM | POA: Diagnosis present

## 2017-12-26 DIAGNOSIS — Z79899 Other long term (current) drug therapy: Secondary | ICD-10-CM | POA: Diagnosis not present

## 2017-12-26 DIAGNOSIS — K644 Residual hemorrhoidal skin tags: Secondary | ICD-10-CM | POA: Diagnosis present

## 2017-12-26 DIAGNOSIS — K269 Duodenal ulcer, unspecified as acute or chronic, without hemorrhage or perforation: Secondary | ICD-10-CM | POA: Diagnosis not present

## 2017-12-26 DIAGNOSIS — F32A Depression, unspecified: Secondary | ICD-10-CM

## 2017-12-26 DIAGNOSIS — Z7984 Long term (current) use of oral hypoglycemic drugs: Secondary | ICD-10-CM | POA: Diagnosis not present

## 2017-12-26 LAB — CBC
HEMATOCRIT: 25.3 % — AB (ref 39.0–52.0)
Hemoglobin: 7.5 g/dL — ABNORMAL LOW (ref 13.0–17.0)
MCH: 22.6 pg — ABNORMAL LOW (ref 26.0–34.0)
MCHC: 29.6 g/dL — ABNORMAL LOW (ref 30.0–36.0)
MCV: 76.2 fL — ABNORMAL LOW (ref 78.0–100.0)
PLATELETS: 309 10*3/uL (ref 150–400)
RBC: 3.32 MIL/uL — AB (ref 4.22–5.81)
RDW: 15 % (ref 11.5–15.5)
WBC: 6.9 10*3/uL (ref 4.0–10.5)

## 2017-12-26 LAB — CBC WITH DIFFERENTIAL/PLATELET
Basophils Absolute: 0 10*3/uL (ref 0.0–0.1)
Basophils Relative: 0 %
EOS ABS: 0.3 10*3/uL (ref 0.0–0.7)
EOS PCT: 4 %
HCT: 30.1 % — ABNORMAL LOW (ref 39.0–52.0)
Hemoglobin: 9.1 g/dL — ABNORMAL LOW (ref 13.0–17.0)
LYMPHS ABS: 1.4 10*3/uL (ref 0.7–4.0)
Lymphocytes Relative: 19 %
MCH: 23.7 pg — AB (ref 26.0–34.0)
MCHC: 30.2 g/dL (ref 30.0–36.0)
MCV: 78.4 fL (ref 78.0–100.0)
MONOS PCT: 9 %
Monocytes Absolute: 0.6 10*3/uL (ref 0.1–1.0)
Neutro Abs: 5.1 10*3/uL (ref 1.7–7.7)
Neutrophils Relative %: 68 %
PLATELETS: 307 10*3/uL (ref 150–400)
RBC: 3.84 MIL/uL — ABNORMAL LOW (ref 4.22–5.81)
RDW: 15.9 % — ABNORMAL HIGH (ref 11.5–15.5)
WBC: 7.4 10*3/uL (ref 4.0–10.5)

## 2017-12-26 LAB — IRON AND TIBC
IRON: 81 ug/dL (ref 45–182)
Saturation Ratios: 23 % (ref 17.9–39.5)
TIBC: 352 ug/dL (ref 250–450)
UIBC: 271 ug/dL

## 2017-12-26 LAB — GLUCOSE, CAPILLARY
Glucose-Capillary: 120 mg/dL — ABNORMAL HIGH (ref 70–99)
Glucose-Capillary: 131 mg/dL — ABNORMAL HIGH (ref 70–99)
Glucose-Capillary: 217 mg/dL — ABNORMAL HIGH (ref 70–99)
Glucose-Capillary: 95 mg/dL (ref 70–99)

## 2017-12-26 LAB — PREPARE RBC (CROSSMATCH)

## 2017-12-26 LAB — FERRITIN: Ferritin: 15 ng/mL — ABNORMAL LOW (ref 24–336)

## 2017-12-26 LAB — OCCULT BLOOD X 1 CARD TO LAB, STOOL: Fecal Occult Bld: NEGATIVE

## 2017-12-26 MED ORDER — FUROSEMIDE 20 MG PO TABS: 20.0000 mg | ORAL_TABLET | ORAL | 11 refills | Status: DC

## 2017-12-26 MED ORDER — ESCITALOPRAM OXALATE 20 MG PO TABS: 10.0000 mg | ORAL_TABLET | Freq: Every evening | ORAL | 11 refills | Status: DC

## 2017-12-26 MED ORDER — POTASSIUM CHLORIDE IN NACL 40-0.9 MEQ/L-% IV SOLN
INTRAVENOUS | Status: DC
  Administered 2017-12-26: 50 mL/h via INTRAVENOUS

## 2017-12-26 MED ORDER — PANTOPRAZOLE SODIUM 40 MG PO TBEC
40.0000 mg | DELAYED_RELEASE_TABLET | Freq: Two times a day (BID) | ORAL | Status: DC
  Administered 2017-12-26 – 2017-12-28 (×5): 40 mg via ORAL
  Filled 2017-12-26 (×5): qty 1

## 2017-12-26 MED ORDER — PANTOPRAZOLE SODIUM 40 MG PO TBEC: 40.0000 mg | DELAYED_RELEASE_TABLET | Freq: Two times a day (BID) | ORAL | 0 refills | Status: DC

## 2017-12-26 MED ORDER — PEG 3350-KCL-NA BICARB-NACL 420 G PO SOLR
4000.0000 mL | Freq: Once | ORAL | Status: AC
  Administered 2017-12-26: 4000 mL via ORAL
  Filled 2017-12-26: qty 4000

## 2017-12-26 MED ORDER — SODIUM CHLORIDE 0.9% IV SOLUTION: Freq: Once | INTRAVENOUS | Status: DC

## 2017-12-26 NOTE — Consult Note (Addendum)
Cardiology Consultation:   Patient ID: [name]; [id number]; 1932-12-29   Admit date: 12/25/2017 Date of Consult: 12/26/2017  Primary Care Provider: Chipper Herb, MD Primary Cardiologist: Minus Breeding, MD 11/19/2017 Primary Electrophysiologist:  none   Patient Profile:   JACOBUS [name] is a 84 y.o. male with a hx of bioprosthetic AVR for AS, D-CHF, CABG 2012, HTN, HLD, DM, BPH, iron deficiency anemia, who is being seen today for the evaluation of syncope at the request of Dr Wynetta Emery.  History of Present Illness:   [name] was seen in the office 11/19/2017 and was doing well.  Carotid Dopplers were ordered for carotid bruit.  He was having some lower extremity edema that was felt secondary to dietary indiscretion.  9/27 ER visit after patient found by social work on the ground.  He was noted to have a history of falls and to be forgetful.  Head CT and other x-rays were without acute injury, degenerative disease was seen. Hemoglobin was 7.9, patient refused Hemoccult and was to follow-up with PCP.  10/3 admission after being found on the floor, med compliance incomplete, hemoglobin 8.1.  He does not recall falling.  He has been having problems with being lightheaded.  However, he denies that this is been happening consistently.  He thinks he had dark stools a couple of months ago, but not recently.  He was transfused 1 unit of packed red cells.  He was not felt to be volume overloaded on exam.  Troponin was mildly elevated, but felt likely due to demand ischemia.  Mr. Fundora does not remember falling.  He admits that when he gets down the ground, he has trouble getting back up.  He has not had chest pain.  He denies palpitations.  He denies lower extremity edema, says he was having it but it is better now.  No orthopnea, or PND.  He has chronic dyspnea on exertion, denies recent change.   Past Medical History:  Diagnosis Date  . Anemia    Iron deficiency  . Aortic stenosis      s/p pericardial AVR  . Aortic valve disorder 06/23/2008   Qualifier: Diagnosis of  By: Mare Ferrari, RMA, Sherri    . Arthritis   . Benign prostatic hypertrophy   . CAD (coronary artery disease) 01/30/2011  . Carotid bruit 08/21/2016  . COLONIC POLYPS 06/23/2008   Qualifier: Diagnosis of  By: Mare Ferrari, RMA, Sherri    . Coronary artery disease    s/p CABG 08/2010    . Diabetes mellitus   . Dyslipidemia 10/15/2017  . Essential hypertension 08/21/2016  . H/O aortic valve replacement 08/21/2016  . Hyperlipidemia   . Hyperlipidemia LDL goal <70 06/23/2008   Qualifier: Diagnosis of  By: Mare Ferrari, RMA, Sherri    . Leg swelling 10/15/2017  . Orthostatic hypotension 06/22/2012  . Palpitations 08/21/2016  . Precordial pain 09/18/2011  . Type 2 diabetes mellitus with hyperlipidemia ([place]) 06/23/2008   Qualifier: Diagnosis of  By: Mare Ferrari, RMA, Sherri    . Unstable angina ([place]) 07/24/2011    Past Surgical History:  Procedure Laterality Date  . AORTIC VALVE REPLACEMENT  08/24/10   71mm pericardial tissue valve  . CORONARY ARTERY BYPASS GRAFT  08/24/10   LIMA to LAD, SVG to ramus intermediate, SVG to diagonal  . HERNIA REPAIR    . knee replacements     x 2     Prior to Admission medications   Medication Sig Start Date End Date Taking? Authorizing Provider  aspirin 81 MG tablet Take 1 tablet (81 mg total) by mouth every morning. 12/25/17  Yes Chipper Herb, MD  escitalopram (LEXAPRO) 20 MG tablet Take 1 tablet (20 mg total) by mouth every evening. 12/25/17  Yes Chipper Herb, MD  furosemide (LASIX) 20 MG tablet Take 1 tablet (20 mg total) by mouth every morning. 12/25/17  Yes Chipper Herb, MD  lisinopril (PRINIVIL,ZESTRIL) 10 MG tablet Take 1 tablet (10 mg total) by mouth every morning. 12/25/17  Yes Chipper Herb, MD  metFORMIN (GLUCOPHAGE) 500 MG tablet Take 1 tablet (500 mg total) by mouth 2 (two) times daily. 12/25/17  Yes Chipper Herb, MD  metoprolol succinate (TOPROL-XL) 25 MG 24 hr tablet Take 1  tablet (25 mg total) by mouth every morning. 12/25/17  Yes Chipper Herb, MD  nitroGLYCERIN (NITROSTAT) 0.4 MG SL tablet Place 1 tablet (0.4 mg total) under the tongue every 5 (five) minutes x 3 doses as needed for chest pain. 08/21/16  Yes Minus Breeding, MD  rosuvastatin (CRESTOR) 40 MG tablet Take 1 tablet (40 mg total) by mouth every evening. 12/25/17 03/25/18 Yes Chipper Herb, MD  ACCU-CHEK SOFTCLIX LANCETS lancets Use as instructed 09/15/15   Chipper Herb, MD  glucose blood (ACCU-CHEK AVIVA) test strip Test BS QD and PRN 11/23/15   Chipper Herb, MD    Inpatient Medications: Scheduled Meds: . sodium chloride   Intravenous Once  . escitalopram  20 mg Oral QPM  . ferrous sulfate  325 mg Oral Q breakfast  . insulin aspart  0-9 Units Subcutaneous TID WC  . metoprolol succinate  25 mg Oral Daily  . rosuvastatin  40 mg Oral QPM   Continuous Infusions:  PRN Meds:   Allergies:   No Known Allergies  Social History:   Social History   Socioeconomic History  . Marital status: Widowed    Spouse name: Not on file  . Number of children: 4  . Years of education: 21  . Highest education level: 11th grade  Occupational History  . Occupation: Retired  Scientific laboratory technician  . Financial resource strain: Not on file  . Food insecurity:    Worry: Not on file    Inability: Not on file  . Transportation needs:    Medical: Not on file    Non-medical: Not on file  Tobacco Use  . Smoking status: Former Smoker    Types: Cigars    Last attempt to quit: 09/17/1993    Years since quitting: 24.2  . Smokeless tobacco: Never Used  Substance and Sexual Activity  . Alcohol use: Yes    Comment: occassional  . Drug use: No  . Sexual activity: Yes  Lifestyle  . Physical activity:    Days per week: Not on file    Minutes per session: Not on file  . Stress: Not on file  Relationships  . Social connections:    Talks on phone: Not on file    Gets together: Not on file    Attends religious  service: Not on file    Active member of club or organization: Not on file    Attends meetings of clubs or organizations: Not on file    Relationship status: Not on file  . Intimate partner violence:    Fear of current or ex partner: Not on file    Emotionally abused: Not on file    Physically abused: Not on file    Forced sexual activity: Not on  file  Other Topics Concern  . Not on file  Social History Narrative  . Not on file    Family History:   Family History  Problem Relation Age of Onset  . Cancer Mother   . Diabetes Father   . Hypertension Other   . Diabetes Other    Family Status:  Family Status  Relation Name Status  . Mother  Deceased at age 70  . Father  Deceased at age 29  . Other  (Not Specified)  . Sister  Alive  . Daughter  Alive  . Son  Alive  . Daughter  Alive  . Daughter  Alive  . Sister  Alive  . Sister  Alive    ROS:  Please see the history of present illness.  All other ROS reviewed and negative.     Physical Exam/Data:   Vitals:   12/26/17 0326 12/26/17 0354 12/26/17 0536 12/26/17 0726  BP: 121/69 126/70 121/79 121/76  Pulse: (!) 104 96 94 88  Resp: 16 16  16   Temp: 98.4 F (36.9 C) 98.9 F (37.2 C) 98.7 F (37.1 C) 98.3 F (36.8 C)  TempSrc: Oral Oral Oral Oral  SpO2: 95% 98% 98% 100%  Weight:      Height:        Intake/Output Summary (Last 24 hours) at 12/26/2017 1021 Last data filed at 12/26/2017 0710 Gross per 24 hour  Intake 922 ml  Output -  Net 922 ml   Filed Weights   12/25/17 1612  Weight: 81.6 kg   Body mass index is 26.58 kg/m.  General:  Well nourished, well developed, in no acute distress HEENT: normal Lymph: no adenopathy Neck: minimal JVD Endocrine:  No thryomegaly Vascular: No carotid bruits; 4/4 extremity pulses 2+, without bruits  Cardiac:  normal S1, S2; ; 2/6 murmur  Lungs:  clear to auscultati slightly irregular rate and rhythm on bilaterally, no wheezing, rhonchi or rales  Abd: soft, nontender,  no hepatomegaly  Ext: no edema Musculoskeletal:  No deformities, BUE and BLE strength normal and equal Skin: warm and dry  Neuro:  CNs 2-12 intact, no focal abnormalities noted Psych:  Normal affect   EKG:  The EKG was personally reviewed and demonstrates: 9/27, sinus tachycardia with a heart rate of 107, PACs and PVCs seen 10/3 ECG ordered, not available for review Telemetry:  Telemetry was personally reviewed and demonstrates: Patient not on  Relevant CV Studies:  CAROTID DOPPLERS: Ordered   ECHO: 10/06/2017 - Left ventricle: The cavity size was normal. Wall thickness was   increased in a pattern of mild LVH. There was focal basal   hypertrophy. Systolic function was vigorous. The estimated   ejection fraction was in the range of 65% to 70%. Doppler   parameters are consistent with abnormal left ventricular   relaxation (grade 1 diastolic dysfunction). - Aortic valve: A bioprosthesis was present. Valve area (VTI): 1.11   cm^2. Valve area (Vmax): 1.07 cm^2. Valve area (Vmean): 0.98   cm^2. - Mitral valve: There was mild regurgitation. Valve area by   pressure half-time: 2.18 cm^2. Valve area by continuity equation   (using LVOT flow): 2.02 cm^2. - Left atrium: The atrium was severely dilated. - Right atrium: The atrium was mildly dilated. - Pulmonary arteries: Systolic pressure was mildly increased. PA   peak pressure: 31 mm Hg (S).  CATH: None since bypass surgery  Laboratory Data:  Chemistry Recent Labs  Lab 12/19/17 1258 12/24/17 1240 12/25/17 1649  NA 134*  139 137  K 4.2 4.5 3.7  CL 102 101 105  CO2 24 26 25   GLUCOSE 130* 112* 142*  BUN 14 12 9   CREATININE 1.05 1.24 0.83  CALCIUM 8.7* 9.1 8.6*  GFRNONAA >60 53* >60  GFRAA >60 62 >60  ANIONGAP 8  --  7    Lab Results  Component Value Date   ALT 23 12/19/2017   AST 37 12/19/2017   ALKPHOS 93 12/19/2017   BILITOT 1.0 12/19/2017   Hematology Recent Labs  Lab 12/24/17 1240 12/25/17 1649 12/26/17 0119   WBC 8.6 7.2 6.9  RBC 3.76* 3.63* 3.32*  HGB 8.5* 8.1* 7.5*  HCT 27.9* 27.8* 25.3*  MCV 74* 76.6* 76.2*  MCH 22.6* 22.3* 22.6*  MCHC 30.5* 29.1* 29.6*  RDW 14.1 14.8 15.0  PLT 398 350 309   Cardiac Enzymes Recent Labs  Lab 12/19/17 1350 12/25/17 1649  TROPONINI <0.03 0.03*   No results for input(s): TROPIPOC in the last 168 hours.  BNP Recent Labs  Lab 12/19/17 1350  BNP 78.0    DDimer No results for input(s): DDIMER in the last 168 hours. TSH:  Lab Results  Component Value Date   TSH 3.500 08/07/2017   Lipids: Lab Results  Component Value Date   CHOL 198 08/07/2017   HDL 56 08/07/2017   LDLCALC 124 (H) 08/07/2017   TRIG 89 08/07/2017   CHOLHDL 3.5 08/07/2017   HgbA1c: Lab Results  Component Value Date   HGBA1C 5.9 01/13/2015   Magnesium:  Magnesium  Date Value Ref Range Status  10/31/2016 1.8 1.7 - 2.4 mg/dL Final   Lab Results  Component Value Date   OCCULTBLD NEGATIVE 12/26/2017   OCCULTBLD Negative 01/17/2015   OCCULTBLD Negative 01/27/2013    Radiology/Studies:  No results found.  Assessment and Plan:   1.  Syncope: -I am not sure if he had syncope or if he fell and just does not remember it - Check ECG and put the patient on telemetry while here. - No significant volume overload by exam, no ischemic symptoms - MD advise if any further work-up is needed as long as no significant arrhythmia on telemetry  Otherwise, per IM Principal Problem:   Symptomatic anemia Active Problems:   Type 2 diabetes mellitus with hyperlipidemia ([facility])   Hyperlipidemia LDL goal <70   CAD (coronary artery disease)   Essential hypertension   Depression     For questions or updates, please contact [facility] Please consult [email or web address] for contact info under Cardiology/STEMI.   SignedRosaria Ferries, PA-C  12/26/2017 10:21 AM   Attending note Patient seen and discussed with PA Barrett, I agree with her documentation above. 84 yo male history of  aortic stenosis s/p tissue AVR, CAD with prior CABG, HTN, HL, orthostatic hypotension admitted with falls. He denies any recent orthostatic symptoms. He does not remember the details of the fall, does not believe he passses out. His family in the room have never observed a true syncopal episode.   WBC 7.2 Hgb 8.1 Plt 350 K 3.7 Cr 0.83  Trop 0.03--> Carotid US no significant stenosis 09/2017 echo LVEF 75-17%, grade I diastolic dysfnction. AVR mean grade 28 EKG SR   Unclear episodes, there is not enough history to determine that this is syncope vs just mechanical falls. He has had moderate gradient across the AV that is stable around 30 based on last 09/2017.  Orthostatics not checked, already reaceived unit of pRBCs this admit.  No arrhythmias noted on  tele thus far, plan for outpatient 21 day event monitor.   No further inpatient workup planned from cardiology standpoint.   [facility] will sign off.   Medication Recommendations:  Continue home regimen Other recommendations (labs, testing, etc):  We will arrange 21 day event monitor Follow up as an outpatient:  We will arrange 1 month f/u    Zandra Abts MD

## 2017-12-26 NOTE — Progress Notes (Signed)
Pt found wandering the halls, in room 333 when his room is 336. Pt moved to room 341 to be closer to the nurses station. Order for safety sitter put in. MD made aware.

## 2017-12-26 NOTE — Progress Notes (Signed)
Pt transported down to ultrasound via transport staff.

## 2017-12-26 NOTE — Progress Notes (Signed)
Patient has had a smear of BM - hemocult sent.  Smear was light brown in color - not obvious blood noted, no dark stool.

## 2017-12-26 NOTE — Evaluation (Signed)
Physical Therapy Evaluation Patient Details Name: [name] MRN: [id number] DOB: 07-Apr-1932 Today's Date: 12/26/2017   History of Present Illness  84 yo male with onset of syncopal episode at home who was admitted for elevated troponin, has symptomatic anemia, and was transfused 10/04.  PMHx:  DM, HTN, CAD, aortic valve replacement, hypotension, CHF, angina, CABG  Clinical Impression  Pt is getting up to walk with assistance, but is unsafe and declined a walker initially.  He is demonstrating some AMS and is concerning as daughter had already brought a RW for him and he does not use it.  Recommend SNF to restore safe mobility and recover his strength and endurance to allow a potential transition home with mod I life as prior to this hospitalization.  Follow acutely for same goals and will use RW as discussed with pt and daughter next visit.    Follow Up Recommendations SNF    Equipment Recommendations  None recommended by PT    Recommendations for Other Services       Precautions / Restrictions Precautions Precautions: Fall(telemetry) Restrictions Weight Bearing Restrictions: No      Mobility  Bed Mobility Overal bed mobility: Needs Assistance Bed Mobility: Supine to Sit     Supine to sit: Mod assist     General bed mobility comments: requires help to sit up and steady side of bed  Transfers Overall transfer level: Needs assistance Equipment used: 1 person hand held assist Transfers: Sit to/from Stand Sit to Stand: Mod assist         General transfer comment: pivot with steps to chair and sits sideways as he unsafely ignores instructions and just plops down in the chair  Ambulation/Gait             General Gait Details: declined to do with PT  Stairs            Wheelchair Mobility    Modified Rankin (Stroke Patients Only)       Balance Overall balance assessment: History of Falls;Needs assistance Sitting-balance support: Feet  supported Sitting balance-Leahy Scale: Good     Standing balance support: Single extremity supported Standing balance-Leahy Scale: Poor                               Pertinent Vitals/Pain Pain Assessment: No/denies pain    Home Living Family/patient expects to be discharged to:: Private residence Living Arrangements: Spouse/significant other Available Help at Discharge: Family;Available 24 hours/day Type of Home: House Home Access: Stairs to enter Entrance Stairs-Rails: None Entrance Stairs-Number of Steps: 1 Home Layout: Two level;Able to live on main level with bedroom/bathroom Home Equipment: Kasandra Knudsen - single point;Walker - 4 wheels Additional Comments: per daughter he is not using the walker but she has observed issues that look concerning    Prior Function Level of Independence: Independent(per pt)         Comments: has been unsafe per family     Hand Dominance   Dominant Hand: Right    Extremity/Trunk Assessment   Upper Extremity Assessment Upper Extremity Assessment: Overall WFL for tasks assessed    Lower Extremity Assessment Lower Extremity Assessment: Generalized weakness    Cervical / Trunk Assessment Cervical / Trunk Assessment: Normal  Communication   Communication: No difficulties  Cognition Arousal/Alertness: Awake/alert Behavior During Therapy: Impulsive Overall Cognitive Status: Impaired/Different from baseline Area of Impairment: Orientation;Attention;Memory;Following commands;Safety/judgement;Awareness;Problem solving  Orientation Level: Situation;Time Current Attention Level: Selective Memory: Decreased short-term memory Following Commands: Follows one step commands with increased time Safety/Judgement: Decreased awareness of deficits;Decreased awareness of safety Awareness: Intellectual Problem Solving: Slow processing;Decreased initiation;Requires verbal cues General Comments: pt is very slow to move  to side of bed, but is fully capable to assist with HHA but would benefit from RW      General Comments      Exercises     Assessment/Plan    PT Assessment Patient needs continued PT services  PT Problem List Decreased range of motion;Decreased activity tolerance;Decreased balance;Decreased mobility;Decreased coordination;Decreased cognition;Decreased safety awareness;Decreased knowledge of use of DME;Cardiopulmonary status limiting activity       PT Treatment Interventions DME instruction;Gait training;Stair training;Functional mobility training;Therapeutic activities;Therapeutic exercise;Balance training;Neuromuscular re-education;Patient/family education    PT Goals (Current goals can be found in the Care Plan section)  Acute Rehab PT Goals Patient Stated Goal: to get home  PT Goal Formulation: With patient/family Time For Goal Achievement: 01/09/18 Potential to Achieve Goals: Good    Frequency Min 2X/week   Barriers to discharge Inaccessible home environment;Decreased caregiver support step with no rail and living alone    Co-evaluation               AM-PAC PT "6 Clicks" Daily Activity  Outcome Measure Difficulty turning over in bed (including adjusting bedclothes, sheets and blankets)?: Unable Difficulty moving from lying on back to sitting on the side of the bed? : Unable Difficulty sitting down on and standing up from a chair with arms (e.g., wheelchair, bedside commode, etc,.)?: Unable Help needed moving to and from a bed to chair (including a wheelchair)?: A Lot Help needed walking in hospital room?: Total Help needed climbing 3-5 steps with a railing? : Total 6 Click Score: 7    End of Session Equipment Utilized During Treatment: Gait belt Activity Tolerance: Patient limited by fatigue Patient left: in chair;with call bell/phone within reach;with chair alarm set;with family/visitor present;with nursing/sitter in room Nurse Communication: Mobility  status PT Visit Diagnosis: Unsteadiness on feet (R26.81);History of falling (Z91.81);Difficulty in walking, not elsewhere classified (R26.2)    Time: 0263-7858 PT Time Calculation (min) (ACUTE ONLY): 27 min   Charges:   PT Evaluation $PT Eval Moderate Complexity: 1 Mod PT Treatments $Therapeutic Activity: 8-22 mins        Ramond Dial 12/26/2017, 11:02 AM  11:04 AM, 12/26/17 Mee Hives, PT, MS Physical Therapist - [place] [phone] (Office)

## 2017-12-26 NOTE — Progress Notes (Signed)
Patient has become confused over the last couple of hours, thinking he is at home and forgetting he is in the hospital.  Was oriented x 3 when admitted to unit.  [name], patients daughter, called to obtain blood consent.  Daughter states that confusion at night time is not out of the ordinary for the patient.  Patient is quickly re-oriented. Will continue to monitor and proceed with blood transfusion.

## 2017-12-26 NOTE — Progress Notes (Signed)
PROGRESS NOTE  [name]  DGL:[id number]  DOB: 05-Jan-1933  DOA: 12/25/2017 PCP: Chipper Herb, MD  Brief Admission Hx: [name] is a 84 y.o. male with medical history significant of type II diabetes, hypertension, hyperlipidemia, coronary artery disease, history of aortic valve replacement, BPH being brought into the hospital after being found on the floor.  He apparently has had severe incidents of passing out and falling.  Was seen in ED last month but refused admission.  He was anemia at that time.  His PCP had referred him to outpatient GI for anemia but he had not yet established care.  Pt poor historian but apparently has not ever had a GI evaluation done in the past.  He was admitted for symptomatic anemia, suspected syncopal episode and for a blood transfusion.    MDM/Assessment & Plan:   1. Symptomatic anemia - He has been transfused 1 unit PRBCs.  He reports having black stools but he has been on iron tablets.  He also had a negative hemoccult test.  Will ask for GI to consult to determine if he will need further inpatient work up or if he is safe to follow up outpatient.   2. Syncopal episodes - Pt has had several of these episodes.  He saw his cardiologist recently and was due to have some carotid dopplers done outpatient. I have ordered to have them done in hospital.  Cardiology consult requested to determine if any further testing needed in hospital.   3. Chronic diastolic Heart failure - Pt appears compensated at this time.   He did have a recent echo 7/19 with preserved EF and grade 1 DD with noted aortic valve bioprosthetic.  4. CAD - no chest pain symptoms.   5. Depression / chronic - resume home escitalopram. 6. Type 2 diabetes mellitus - monitor CBG/ SSI if needed, mostly diet controlled disease at this point.  7. Essential hypertension - resumed home blood pressure medications.  8. Hyperlipidemia - resume home rosuvastatin.   DVT prophylaxis: SCDs Code Status: Full   Family Communication: patient  Disposition Plan: SNF   Consultants:  Cardio  GI  Procedures:  TBD  Antimicrobials:  n/a   Subjective: Pt confused this morning but denies specific complaints.    Objective: Vitals:   12/26/17 0326 12/26/17 0354 12/26/17 0536 12/26/17 0726  BP: 121/69 126/70 121/79 121/76  Pulse: (!) 104 96 94 88  Resp: 16 16  16   Temp: 98.4 F (36.9 C) 98.9 F (37.2 C) 98.7 F (37.1 C) 98.3 F (36.8 C)  TempSrc: Oral Oral Oral Oral  SpO2: 95% 98% 98% 100%  Weight:      Height:        Intake/Output Summary (Last 24 hours) at 12/26/2017 5188 Last data filed at 12/26/2017 0710 Gross per 24 hour  Intake 922 ml  Output -  Net 922 ml   Filed Weights   12/25/17 1612  Weight: 81.6 kg     REVIEW OF SYSTEMS  As per history otherwise all reviewed and reported negative  Exam:  General exam: confused male, awake, arousable, NAD.   Respiratory system: Clear. No increased work of breathing. Cardiovascular system: normal S1 & S2 heard, RRR. No JVD. Harsh 3/5 holosystolic murmur. Gastrointestinal system: Abdomen is nondistended, soft and nontender. Normal bowel sounds heard. Central nervous system: Alert and oriented. No focal neurological deficits. Extremities: no CCE.  Data Reviewed: Basic Metabolic Panel: Recent Labs  Lab 12/19/17 1258 12/24/17 1240 12/25/17 1649  NA 134* 139 137  K 4.2 4.5 3.7  CL 102 101 105  CO2 24 26 25   GLUCOSE 130* 112* 142*  BUN 14 12 9   CREATININE 1.05 1.24 0.83  CALCIUM 8.7* 9.1 8.6*   Liver Function Tests: Recent Labs  Lab 12/19/17 1350  AST 37  ALT 23  ALKPHOS 93  BILITOT 1.0  PROT 7.3  ALBUMIN 3.2*   Recent Labs  Lab 12/19/17 1350  LIPASE 37   No results for input(s): AMMONIA in the last 168 hours. CBC: Recent Labs  Lab 12/19/17 1258 12/24/17 1240 12/25/17 1649 12/26/17 0119  WBC 10.0 8.6 7.2 6.9  NEUTROABS  --  6.2 5.1  --   HGB 7.9* 8.5* 8.1* 7.5*  HCT 26.2* 27.9* 27.8* 25.3*    MCV 77.5* 74* 76.6* 76.2*  PLT 284 398 350 309   Cardiac Enzymes: Recent Labs  Lab 12/19/17 1350 12/25/17 1649  CKTOTAL 92  --   TROPONINI <0.03 0.03*   CBG (last 3)  Recent Labs    12/25/17 1702 12/25/17 2200 12/26/17 0731  GLUCAP 129* 104* 120*   No results found for this or any previous visit (from the past 240 hour(s)).   Studies: No results found.  Scheduled Meds: . sodium chloride   Intravenous Once  . escitalopram  20 mg Oral QPM  . ferrous sulfate  325 mg Oral Q breakfast  . insulin aspart  0-9 Units Subcutaneous TID WC  . metoprolol succinate  25 mg Oral Daily  . rosuvastatin  40 mg Oral QPM   Continuous Infusions:  Principal Problem:   Symptomatic anemia Active Problems:   Type 2 diabetes mellitus with hyperlipidemia (HCC)   Hyperlipidemia LDL goal <70   CAD (coronary artery disease)   Essential hypertension   Depression  Time spent:   Irwin Brakeman, MD, FAAFP [facility] Pager [phone]  If 7PM-7AM, please contact night-coverage [email or web address] Password [email or web address] 12/26/2017, 8:32 AM    LOS: 0 days

## 2017-12-26 NOTE — Consult Note (Addendum)
Referring Provider: No ref. provider found Primary Care Physician:  Chipper Herb, MD Primary Gastroenterologist:  Dr. Amedeo Plenty  Date of Admission: 12/25/17 Date of Consultation: 12/26/17  Reason for Consultation:  Anemia, syncope  HPI:  [name] is a 84 y.o. male with a past medical history of anemia, aortic stenosis status post pericardial AVR, CAD, colon polyps, CABG in 2012, diabetes, dyslipidemia presented to the emergency department 12/25/2017 by emergency services because he was found on the floor.  ED provider in H&P reviewed.  The patient remembered feeling lightheaded but does not remember passing out.  No abdominal pain, hematochezia, melena.  Stated he had dark stools 2 to 3 months ago.  He reports losing 8 pounds in the past 3 months.  Previous fall December 19, 2017 with chest x-ray showing no acute cardiopulmonary disease, negative CT of the head, x-rays with degenerative joint disease but no fractures.  His hemoglobin at that time was 7.9 but he declined admission.  On arrival to the emergency department his vitals were stable.  Hemoglobin slightly improved to 8.1, troponin 0.03.  He was subsequently admitted for further werk-up.  CBC this morning with hemoglobin mild declined to 7.5 but noted some decline in most cells likely hydration effect.  Occult blood card from stool was negative for heme.  He did receive a unit of blood in the early morning hours, no repeat CBC found on the chart.  Today he is a bit sleepy.  He does answer questions appropriately, although short answers which are brief.  Denies abdominal pain, nausea, vomiting.  He admits he did pass out yesterday.  He has had weakness for about the past month.  He admits dark stools "a while back" but none in the past several weeks.  Denies hematochezia.  States he has had a colonoscopy but does not remember when, although he states it is more than 10 years ago.  He has not seen any recent bleeding.  Denies abdominal pain.   States if he needed endoscopic evaluation he could consider it, but then states "I do not need a colonoscopy or anything like that."  No other GI complaints.  Past Medical History:  Diagnosis Date  . Anemia    Iron deficiency  . Aortic stenosis    s/p pericardial AVR  . Aortic valve disorder 06/23/2008   Qualifier: Diagnosis of  By: Mare Ferrari, RMA, Sherri    . Arthritis   . Benign prostatic hypertrophy   . CAD (coronary artery disease) 01/30/2011  . Carotid bruit 08/21/2016  . COLONIC POLYPS 06/23/2008   Qualifier: Diagnosis of  By: Mare Ferrari, RMA, Sherri    . Coronary artery disease    s/p CABG 08/2010    . Diabetes mellitus   . Dyslipidemia 10/15/2017  . Essential hypertension 08/21/2016  . H/O aortic valve replacement 08/21/2016  . Hyperlipidemia   . Hyperlipidemia LDL goal <70 06/23/2008   Qualifier: Diagnosis of  By: Mare Ferrari, RMA, Sherri    . Leg swelling 10/15/2017  . Orthostatic hypotension 06/22/2012  . Palpitations 08/21/2016  . Precordial pain 09/18/2011  . Type 2 diabetes mellitus with hyperlipidemia ([place]) 06/23/2008   Qualifier: Diagnosis of  By: Mare Ferrari, RMA, Sherri    . Unstable angina ([place]) 07/24/2011    Past Surgical History:  Procedure Laterality Date  . AORTIC VALVE REPLACEMENT  08/24/10   109mm pericardial tissue valve  . CORONARY ARTERY BYPASS GRAFT  08/24/10   LIMA to LAD, SVG to ramus intermediate, SVG to diagonal  .  HERNIA REPAIR    . knee replacements     x 2    Prior to Admission medications   Medication Sig Start Date End Date Taking? Authorizing Provider  aspirin 81 MG tablet Take 1 tablet (81 mg total) by mouth every morning. 12/25/17  Yes Chipper Herb, MD  escitalopram (LEXAPRO) 20 MG tablet Take 1 tablet (20 mg total) by mouth every evening. 12/25/17  Yes Chipper Herb, MD  furosemide (LASIX) 20 MG tablet Take 1 tablet (20 mg total) by mouth every morning. 12/25/17  Yes Chipper Herb, MD  lisinopril (PRINIVIL,ZESTRIL) 10 MG tablet Take 1 tablet (10 mg total) by  mouth every morning. 12/25/17  Yes Chipper Herb, MD  metFORMIN (GLUCOPHAGE) 500 MG tablet Take 1 tablet (500 mg total) by mouth 2 (two) times daily. 12/25/17  Yes Chipper Herb, MD  metoprolol succinate (TOPROL-XL) 25 MG 24 hr tablet Take 1 tablet (25 mg total) by mouth every morning. 12/25/17  Yes Chipper Herb, MD  nitroGLYCERIN (NITROSTAT) 0.4 MG SL tablet Place 1 tablet (0.4 mg total) under the tongue every 5 (five) minutes x 3 doses as needed for chest pain. 08/21/16  Yes Minus Breeding, MD  rosuvastatin (CRESTOR) 40 MG tablet Take 1 tablet (40 mg total) by mouth every evening. 12/25/17 03/25/18 Yes Chipper Herb, MD  ACCU-CHEK SOFTCLIX LANCETS lancets Use as instructed 09/15/15   Chipper Herb, MD  glucose blood (ACCU-CHEK AVIVA) test strip Test BS QD and PRN 11/23/15   Chipper Herb, MD    Current Facility-Administered Medications  Medication Dose Route Frequency Provider Last Rate Last Dose  . 0.9 %  sodium chloride infusion (Manually program via Guardrails IV Fluids)   Intravenous Once Shela Leff, MD      . escitalopram (LEXAPRO) tablet 20 mg  20 mg Oral QPM Shela Leff, MD   20 mg at 12/25/17 2222  . ferrous sulfate tablet 325 mg  325 mg Oral Q breakfast Shela Leff, MD      . insulin aspart (novoLOG) injection 0-9 Units  0-9 Units Subcutaneous TID WC Shela Leff, MD      . metoprolol succinate (TOPROL-XL) 24 hr tablet 25 mg  25 mg Oral Daily Shela Leff, MD      . rosuvastatin (CRESTOR) tablet 40 mg  40 mg Oral QPM Shela Leff, MD   40 mg at 12/25/17 2222    Allergies as of 12/25/2017  . (No Known Allergies)    Family History  Problem Relation Age of Onset  . Cancer Mother   . Diabetes Father   . Hypertension Other   . Diabetes Other     Social History   Socioeconomic History  . Marital status: Widowed    Spouse name: Not on file  . Number of children: 4  . Years of education: 3  . Highest education level: 11th grade   Occupational History  . Occupation: Retired  Scientific laboratory technician  . Financial resource strain: Not on file  . Food insecurity:    Worry: Not on file    Inability: Not on file  . Transportation needs:    Medical: Not on file    Non-medical: Not on file  Tobacco Use  . Smoking status: Former Smoker    Types: Cigars    Last attempt to quit: 09/17/1993    Years since quitting: 24.2  . Smokeless tobacco: Never Used  Substance and Sexual Activity  . Alcohol use: Yes  Comment: occassional  . Drug use: No  . Sexual activity: Yes  Lifestyle  . Physical activity:    Days per week: Not on file    Minutes per session: Not on file  . Stress: Not on file  Relationships  . Social connections:    Talks on phone: Not on file    Gets together: Not on file    Attends religious service: Not on file    Active member of club or organization: Not on file    Attends meetings of clubs or organizations: Not on file    Relationship status: Not on file  . Intimate partner violence:    Fear of current or ex partner: Not on file    Emotionally abused: Not on file    Physically abused: Not on file    Forced sexual activity: Not on file  Other Topics Concern  . Not on file  Social History Narrative  . Not on file    Review of Systems: General: Negative for anorexia, weight loss, fever, chills, fatigue, weakness. ENT: Negative for hoarseness, difficulty swallowing. CV: Negative for chest pain, angina, palpitations, peripheral edema.  Respiratory: Negative for dyspnea at rest, cough, sputum, wheezing.  GI: See history of present illness. GU:  Negative for dysuria, hematuria, urinary incontinence, urinary frequency, nocturnal urination.  Endo: Negative for unusual weight change.  Heme: Negative for bruising or bleeding. Allergy: Negative for rash or hives.  Physical Exam: Vital signs in last 24 hours: Temp:  [97.7 F (36.5 C)-98.9 F (37.2 C)] 98.3 F (36.8 C) (10/04 0726) Pulse Rate:   [88-111] 88 (10/04 0726) Resp:  [14-25] 16 (10/04 0726) BP: (121-151)/(69-79) 121/76 (10/04 0726) SpO2:  [91 %-100 %] 100 % (10/04 0726) Weight:  [81.6 kg] 81.6 kg (10/03 1612) Last BM Date: 12/23/17 General:   Alert,  Well-developed, well-nourished, pleasant and cooperative in NAD Eyes:  Sclera clear, no icterus. Conjunctiva pink. Ears:  Normal auditory acuity. Neck:  Supple; no masses or thyromegaly. Lungs:  Clear throughout to auscultation. No wheezes, crackles, or rhonchi. No acute distress. Heart:  Regular rate and rhythm; no murmurs, clicks, rubs,  or gallops. Abdomen:  Soft, nontender and nondistended. No masses, hepatosplenomegaly or hernias noted. Normal bowel sounds, without guarding, and without rebound.   Rectal:  Deferred.   Msk:  Symmetrical without gross deformities. Psych:  Alert and cooperative. Normal mood and affect.  Intake/Output from previous day: 10/03 0701 - 10/04 0700 In: 500 [I.V.:500] Out: -  Intake/Output this shift: Total I/O In: 422 [Blood:422] Out: -   Lab Results: Recent Labs    12/24/17 1240 12/25/17 1649 12/26/17 0119  WBC 8.6 7.2 6.9  HGB 8.5* 8.1* 7.5*  HCT 27.9* 27.8* 25.3*  PLT 398 350 309   BMET Recent Labs    12/24/17 1240 12/25/17 1649  NA 139 137  K 4.5 3.7  CL 101 105  CO2 26 25  GLUCOSE 112* 142*  BUN 12 9  CREATININE 1.24 0.83  CALCIUM 9.1 8.6*   LFT No results for input(s): PROT, ALBUMIN, AST, ALT, ALKPHOS, BILITOT, BILIDIR, IBILI in the last 72 hours. PT/INR No results for input(s): LABPROT, INR in the last 72 hours. Hepatitis Panel No results for input(s): HEPBSAG, HCVAB, HEPAIGM, HEPBIGM in the last 72 hours. C-Diff No results for input(s): CDIFFTOX in the last 72 hours.  Studies/Results: No results found.  Impression: Recent progressive, likely iron deficiency anemia.  He has had syncopal episodes twice in the past couple months.  His hemoglobin in the low 8 range within the past few weeks.  Last normal  hemoglobin 12.5 2017 which was 13.1.  For the majority of 2018 he was 11.6-12.6.  Middle of this year he began to decline to 10.0, 9.4, 7.9 on previous ER visit 12/19/2017.  His current CBC shows microcytic and hypochromic anemia.  Platelets are normal.  Last colonoscopy over 10 years ago.  He does not feel he needs endoscopic evaluation.  There is no sign of acute GI bleed ongoing.  There is a possibility of chronic slow bleed causing his progressive anemia.  He did note dark stools about 2 to 3 months ago.  Possible etiologies include esophagitis, gastritis, duodenitis, peptic ulcer disease, duodenal ulcers, small bowel or gastric AVMs, bleeding polyps, less likely colorectal cancer.  Plan: 1. Recheck CBC this afternoon 2. Check iron studies 3. Monitor H/H for worsening bleed 4. Monitor stools for signs of overt GI bleed 5. Continue PPI 6. Transfuse as necessary 7. No identified need for urgent endoscopic evaluation at this time. 8. Supportive measures   Thank you for allowing [place] to participate in the care of [place], DNP, AGNP-C Adult & Gerontological Nurse Practitioner [facility]   LOS: 0 days     12/26/2017, 8:41 AM

## 2017-12-26 NOTE — Care Management Obs Status (Signed)
[facility] NOTIFICATION   Patient Details  Name: SELAH ZELMAN MRN: [id number] Date of Birth: 12-Jul-1932   Medicare Observation Status Notification Given:  Yes    Sherald Barge, RN 12/26/2017, 2:10 PM

## 2017-12-26 NOTE — Progress Notes (Signed)
Informed consent obtained via telephone encounter with daughter [name] for patient's procedures tomorrow due to patient's confusion. Verified consent with another RN. Patient started prep and has bedside commode at bedside per MD order starting at 1630. Will continue to monitor.

## 2017-12-27 ENCOUNTER — Encounter (HOSPITAL_COMMUNITY)
Admission: EM | Disposition: A | Discharge status: Home Health | Payer: Self-pay | Source: Home / Self Care | Attending: Family Medicine

## 2017-12-27 DIAGNOSIS — D509 Iron deficiency anemia, unspecified: Secondary | ICD-10-CM

## 2017-12-27 LAB — CBC
HCT: 29.3 % — ABNORMAL LOW (ref 39.0–52.0)
HEMOGLOBIN: 8.9 g/dL — AB (ref 13.0–17.0)
MCH: 23.9 pg — ABNORMAL LOW (ref 26.0–34.0)
MCHC: 30.4 g/dL (ref 30.0–36.0)
MCV: 78.8 fL (ref 78.0–100.0)
PLATELETS: 293 10*3/uL (ref 150–400)
RBC: 3.72 MIL/uL — ABNORMAL LOW (ref 4.22–5.81)
RDW: 16.1 % — ABNORMAL HIGH (ref 11.5–15.5)
WBC: 7.6 10*3/uL (ref 4.0–10.5)

## 2017-12-27 LAB — BPAM RBC
Blood Product Expiration Date: 201911052359
ISSUE DATE / TIME: 201910040334
Unit Type and Rh: 5100

## 2017-12-27 LAB — BASIC METABOLIC PANEL
ANION GAP: 7 (ref 5–15)
BUN: 8 mg/dL (ref 8–23)
CHLORIDE: 105 mmol/L (ref 98–111)
CO2: 26 mmol/L (ref 22–32)
Calcium: 8.6 mg/dL — ABNORMAL LOW (ref 8.9–10.3)
Creatinine, Ser: 0.98 mg/dL (ref 0.61–1.24)
GFR calc Af Amer: >60 mL/min (ref >60)
Glucose, Bld: 110 mg/dL — ABNORMAL HIGH (ref 70–99)
POTASSIUM: 4.1 mmol/L (ref 3.5–5.1)
SODIUM: 138 mmol/L (ref 135–145)

## 2017-12-27 LAB — TYPE AND SCREEN
ABO/RH(D): O POS
Antibody Screen: NEGATIVE
Unit division: 0

## 2017-12-27 LAB — GLUCOSE, CAPILLARY
GLUCOSE-CAPILLARY: 111 mg/dL — AB (ref 70–99)
GLUCOSE-CAPILLARY: 90 mg/dL (ref 70–99)
GLUCOSE-CAPILLARY: 102 mg/dL — AB (ref 70–99)
Glucose-Capillary: 112 mg/dL — ABNORMAL HIGH (ref 70–99)

## 2017-12-27 SURGERY — COLONOSCOPY
Anesthesia: Monitor Anesthesia Care

## 2017-12-27 MED ORDER — ESCITALOPRAM OXALATE 10 MG PO TABS
10.0000 mg | ORAL_TABLET | Freq: Every evening | ORAL | Status: DC
  Administered 2017-12-27: 10 mg via ORAL
  Filled 2017-12-27: qty 1

## 2017-12-27 MED ORDER — SODIUM CHLORIDE 0.9 % IV SOLN
INTRAVENOUS | Status: DC
  Administered 2017-12-27: 12:00:00 via INTRAVENOUS

## 2017-12-27 NOTE — Progress Notes (Signed)
PROGRESS NOTE  [name]  HGD:[id number]  DOB: 1932-06-24  DOA: 12/25/2017 PCP: Chipper Herb, MD  Brief Admission Hx: [name] is a 84 y.o. male with medical history significant of type II diabetes, hypertension, hyperlipidemia, coronary artery disease, history of aortic valve replacement, BPH being brought into the hospital after being found on the floor.  He apparently has had severe incidents of passing out and falling.  Was seen in ED last month but refused admission.  He was anemia at that time.  His PCP had referred him to outpatient GI for anemia but he had not yet established care.  Pt poor historian but apparently has not ever had a GI evaluation done in the past.  He was admitted for symptomatic anemia, suspected syncopal episode and for a blood transfusion.    MDM/Assessment & Plan:   1. Symptomatic anemia - He has been transfused 1 unit PRBCs.  GI team is planning TCS for 10/5.   2. Syncopal episodes - Pt has had several of these episodes.  Appreciate cardiology consult.    3. Chronic diastolic Heart failure - Pt appears compensated at this time.   He did have a recent echo 7/19 with preserved EF and grade 1 DD with noted aortic valve bioprosthetic.  4. CAD - no chest pain symptoms.   5. Depression / chronic - resume home escitalopram. 6. Type 2 diabetes mellitus - monitor CBG/ SSI if needed, mostly diet controlled disease at this point.  7. Essential hypertension - resumed home blood pressure medications.  8. Hyperlipidemia - resume home rosuvastatin.   DVT prophylaxis: SCDs Code Status: Full  Family Communication: patient  Disposition Plan: SNF   Consultants:  Cardio  GI  Procedures:  TBD  Antimicrobials:  n/a   Subjective: Pt apparently was found eating when he was being prepped for TCS.  The procedures were cancelled.  Pt has a sitter at bedside due to wandering in the halls.     Objective: Vitals:   12/26/17 0536 12/26/17 0726 12/26/17 2012  12/27/17 0700  BP: 121/79 121/76 135/88 (!) 157/90  Pulse: 94 88 81 74  Resp:  16 20 20   Temp: 98.7 F (37.1 C) 98.3 F (36.8 C) 98.7 F (37.1 C) 98.6 F (37 C)  TempSrc: Oral Oral Oral   SpO2: 98% 100% 100% 99%  Weight:      Height:        Intake/Output Summary (Last 24 hours) at 12/27/2017 0849 Last data filed at 12/26/2017 1112 Gross per 24 hour  Intake 240 ml  Output -  Net 240 ml   Filed Weights   12/25/17 1612  Weight: 81.6 kg   REVIEW OF SYSTEMS  As per history otherwise all reviewed and reported negative  Exam:  General exam: confused at times, awake, arousable, NAD.   Respiratory system:  No increased work of breathing. Cardiovascular system: normal S1 & S2 heard, RRR. No JVD. Harsh 3/5 holosystolic murmur. Gastrointestinal system: Abdomen is nondistended, soft and nontender. Normal bowel sounds heard. Central nervous system: Alert and oriented. No focal neurological deficits. Extremities: no CCE.  Data Reviewed: Basic Metabolic Panel: Recent Labs  Lab 12/24/17 1240 12/25/17 1649  NA 139 137  K 4.5 3.7  CL 101 105  CO2 26 25  GLUCOSE 112* 142*  BUN 12 9  CREATININE 1.24 0.83  CALCIUM 9.1 8.6*   Liver Function Tests: No results for input(s): AST, ALT, ALKPHOS, BILITOT, PROT, ALBUMIN in the last 168 hours.  No results for input(s): LIPASE, AMYLASE in the last 168 hours. No results for input(s): AMMONIA in the last 168 hours. CBC: Recent Labs  Lab 12/24/17 1240 12/25/17 1649 12/26/17 0119 12/26/17 1429  WBC 8.6 7.2 6.9 7.4  NEUTROABS 6.2 5.1  --  5.1  HGB 8.5* 8.1* 7.5* 9.1*  HCT 27.9* 27.8* 25.3* 30.1*  MCV 74* 76.6* 76.2* 78.4  PLT 398 350 309 307   Cardiac Enzymes: Recent Labs  Lab 12/25/17 1649  TROPONINI 0.03*   CBG (last 3)  Recent Labs    12/26/17 1640 12/26/17 2204 12/27/17 0745  GLUCAP 217* 95 112*   No results found for this or any previous visit (from the past 240 hour(s)).   Studies: US Carotid Bilateral  Result  Date: 12/26/2017 CLINICAL DATA:  Syncope and collapse EXAM: BILATERAL CAROTID DUPLEX ULTRASOUND TECHNIQUE: Pearline Cables scale imaging, color Doppler and duplex ultrasound were performed of bilateral carotid and vertebral arteries in the neck. COMPARISON:  None. FINDINGS: Criteria: Quantification of carotid stenosis is based on velocity parameters that correlate the residual internal carotid diameter with NASCET-based stenosis levels, using the diameter of the distal internal carotid lumen as the denominator for stenosis measurement. The following velocity measurements were obtained: RIGHT ICA: 55/15 cm/sec CCA: 68/11 cm/sec SYSTOLIC ICA/CCA RATIO:  0.8 ECA: 70 cm/sec LEFT ICA: 39/13 cm/sec CCA: 57/26 cm/sec SYSTOLIC ICA/CCA RATIO:  0.6 ECA: 85 cm/sec RIGHT CAROTID ARTERY: Mild smooth plaque in the common carotid artery. Partially calcified plaque in the carotid bulb extending into proximal internal and external carotid arteries without high-grade stenosis. Normal waveforms and color Doppler signal. Distal ICA tortuous. RIGHT VERTEBRAL ARTERY:  Normal flow direction and waveform. LEFT CAROTID ARTERY: Eccentric nonocclusive plaque in the proximal common carotid artery. Circumferential plaque in the bulb, partially calcified, extending into the proximal ICA. No high-grade stenosis. Normal waveforms and color Doppler signal. Distal ICA mildly tortuous. LEFT VERTEBRAL ARTERY:  Normal flow direction and waveform. IMPRESSION: 1. Bilateral carotid bifurcation and proximal ICA plaque resulting in less than 50% diameter stenosis. 2.  Antegrade bilateral vertebral arterial flow. Electronically Signed   By: Lucrezia Europe M.D.   On: 12/26/2017 10:34    Scheduled Meds: . sodium chloride   Intravenous Once  . escitalopram  10 mg Oral QPM  . insulin aspart  0-9 Units Subcutaneous TID WC  . metoprolol succinate  25 mg Oral Daily  . pantoprazole  40 mg Oral BID AC  . rosuvastatin  40 mg Oral QPM   Continuous Infusions:  Principal  Problem:   Symptomatic anemia Active Problems:   Type 2 diabetes mellitus with hyperlipidemia (HCC)   Hyperlipidemia LDL goal <70   CAD (coronary artery disease)   Essential hypertension   Depression   Syncope   Syncope and collapse   Fall   Sepsis ([place])  Time spent:   Irwin Brakeman, MD, FAAFP [facility] Pager [phone]  If 7PM-7AM, please contact night-coverage [email or web address] Password [email or web address] 12/27/2017, 8:49 AM    LOS: 1 day

## 2017-12-27 NOTE — Progress Notes (Signed)
  Subjective:  Patient has no complaints.  He denies nausea vomiting chest pain shortness of breath or abdominal pain.  No melena or rectal bleeding reported.  Patient states he lives alone.  He lost his wife 2 years ago.  He has 4 children and 1 of his sons live close by.  Objective: Blood pressure (!) 157/90, pulse 74, temperature 98.6 F (37 C), resp. rate 20, height 5\' 9"  (1.753 m), weight 81.6 kg, SpO2 99 %. Patient is alert and in no acute distress. Cardiac exam with regular rhythm normal S1 prominent S2 with grade 2/6 systolic ejection murmur best heard at aortic area. Lungs are clear to auscultation. Abdomen is full but soft and nontender with organomegaly or masses. He has trace edema valving left foot.  Labs/studies Results:  Recent Labs    01/16/2018 1649 12/26/17 0119 12/26/17 1429  WBC 7.2 6.9 7.4  HGB 8.1* 7.5* 9.1*  HCT 27.8* 25.3* 30.1*  PLT 350 309 307    BMET  Recent Labs    12/24/17 1240 January 16, 2018 1649  NA 139 137  K 4.5 3.7  CL 101 105  CO2 26 25  GLUCOSE 112* 142*  BUN 12 9  CREATININE 1.24 0.83  CALCIUM 9.1 8.6*      Assessment:  #1.  Iron deficiency anemia.  His stool was guaiac negative but serum iron and ferritin are low.  She was scheduled to undergo EGD and colonoscopy this morning.  He did not take the prep last night and furthermore he had snack this morning.  Therefore procedure rescheduled for tomorrow morning. She has received 1 unit of PRBC this admission and his H&H from this morning is pending.  #2.  Syncopal episodes.  Cardiology consultation pending.  These episodes could be related to drop in his H&H.  No arrhythmia noted on telemetry.  #3.  History of diastolic CHF.  Patient is status post AVR with bioprosthetic valve.Marland Kitchen  He is not in CHF.  #4.  Confusion last night possibly related to hospitalization.  He is not confused this morning.  Recommendations:  Esophagogastroduodenoscopy and colonoscopy rescheduled for tomorrow  morning. We will continue clear liquids and try to finish prep today. If he is not able to finish prep we will give him OTC laxative.

## 2017-12-28 ENCOUNTER — Encounter (HOSPITAL_COMMUNITY)
Admission: EM | Disposition: A | Discharge status: Home Health | Payer: Self-pay | Source: Home / Self Care | Attending: Family Medicine

## 2017-12-28 ENCOUNTER — Inpatient Hospital Stay (HOSPITAL_COMMUNITY): Payer: Medicare HMO | Admitting: Anesthesiology

## 2017-12-28 DIAGNOSIS — K449 Diaphragmatic hernia without obstruction or gangrene: Secondary | ICD-10-CM

## 2017-12-28 DIAGNOSIS — K644 Residual hemorrhoidal skin tags: Secondary | ICD-10-CM

## 2017-12-28 DIAGNOSIS — D123 Benign neoplasm of transverse colon: Secondary | ICD-10-CM

## 2017-12-28 DIAGNOSIS — K5730 Diverticulosis of large intestine without perforation or abscess without bleeding: Secondary | ICD-10-CM

## 2017-12-28 DIAGNOSIS — K269 Duodenal ulcer, unspecified as acute or chronic, without hemorrhage or perforation: Secondary | ICD-10-CM

## 2017-12-28 DIAGNOSIS — K3189 Other diseases of stomach and duodenum: Secondary | ICD-10-CM

## 2017-12-28 HISTORY — PX: COLONOSCOPY WITH PROPOFOL: SHX5780

## 2017-12-28 HISTORY — PX: POLYPECTOMY: SHX5525

## 2017-12-28 HISTORY — PX: ESOPHAGOGASTRODUODENOSCOPY (EGD) WITH PROPOFOL: SHX5813

## 2017-12-28 LAB — CBC
HEMATOCRIT: 31.1 % — AB (ref 39.0–52.0)
HEMOGLOBIN: 9.3 g/dL — AB (ref 13.0–17.0)
MCH: 23.4 pg — ABNORMAL LOW (ref 26.0–34.0)
MCHC: 29.9 g/dL — ABNORMAL LOW (ref 30.0–36.0)
MCV: 78.3 fL (ref 78.0–100.0)
Platelets: 321 10*3/uL (ref 150–400)
RBC: 3.97 MIL/uL — ABNORMAL LOW (ref 4.22–5.81)
RDW: 16.1 % — ABNORMAL HIGH (ref 11.5–15.5)
WBC: 7.9 10*3/uL (ref 4.0–10.5)

## 2017-12-28 LAB — BASIC METABOLIC PANEL
ANION GAP: 8 (ref 5–15)
BUN: 7 mg/dL — ABNORMAL LOW (ref 8–23)
CALCIUM: 8.6 mg/dL — AB (ref 8.9–10.3)
CHLORIDE: 104 mmol/L (ref 98–111)
CO2: 25 mmol/L (ref 22–32)
Creatinine, Ser: 0.98 mg/dL (ref 0.61–1.24)
GFR calc non Af Amer: >60 mL/min (ref >60)
Glucose, Bld: 100 mg/dL — ABNORMAL HIGH (ref 70–99)
Potassium: 3.8 mmol/L (ref 3.5–5.1)
SODIUM: 137 mmol/L (ref 135–145)

## 2017-12-28 LAB — GLUCOSE, CAPILLARY
GLUCOSE-CAPILLARY: 83 mg/dL (ref 70–99)
Glucose-Capillary: 89 mg/dL (ref 70–99)
Glucose-Capillary: 85 mg/dL (ref 70–99)
Glucose-Capillary: 101 mg/dL — ABNORMAL HIGH (ref 70–99)

## 2017-12-28 SURGERY — ESOPHAGOGASTRODUODENOSCOPY (EGD) WITH PROPOFOL
Anesthesia: Monitor Anesthesia Care

## 2017-12-28 MED ORDER — SODIUM CHLORIDE 0.9 % IV SOLN: INTRAVENOUS | Status: DC

## 2017-12-28 MED ORDER — FERROUS SULFATE 325 (65 FE) MG PO TABS
325.0000 mg | ORAL_TABLET | Freq: Every day | ORAL | Status: DC
  Filled 2017-12-28: qty 1

## 2017-12-28 MED ORDER — HYDROCODONE-ACETAMINOPHEN 7.5-325 MG PO TABS: 1.0000 | ORAL_TABLET | Freq: Once | ORAL | Status: DC | PRN

## 2017-12-28 MED ORDER — MEPERIDINE HCL 100 MG/ML IJ SOLN: 6.2500 mg | INTRAMUSCULAR | Status: DC | PRN

## 2017-12-28 MED ORDER — FERROUS SULFATE 325 (65 FE) MG PO TABS: 325.0000 mg | ORAL_TABLET | Freq: Every day | ORAL | 0 refills | Status: DC

## 2017-12-28 MED ORDER — PROPOFOL 10 MG/ML IV BOLUS
INTRAVENOUS | Status: AC
  Filled 2017-12-28: qty 20

## 2017-12-28 MED ORDER — PROPOFOL 10 MG/ML IV BOLUS
INTRAVENOUS | Status: DC | PRN
  Administered 2017-12-28: 10 mg via INTRAVENOUS
  Administered 2017-12-28 (×2): 20 mg via INTRAVENOUS
  Administered 2017-12-28: 10 mg via INTRAVENOUS
  Administered 2017-12-28: 40 mg via INTRAVENOUS
  Administered 2017-12-28: 20 mg via INTRAVENOUS
  Administered 2017-12-28: 10 mg via INTRAVENOUS
  Administered 2017-12-28: 40 mg via INTRAVENOUS
  Administered 2017-12-28: 20 mg via INTRAVENOUS

## 2017-12-28 MED ORDER — LACTATED RINGERS IV SOLN: INTRAVENOUS | Status: DC

## 2017-12-28 MED ORDER — PROMETHAZINE HCL 25 MG/ML IJ SOLN: 6.2500 mg | INTRAMUSCULAR | Status: DC | PRN

## 2017-12-28 MED ORDER — HYDROMORPHONE HCL 1 MG/ML IJ SOLN: 0.2500 mg | INTRAMUSCULAR | Status: DC | PRN

## 2017-12-28 NOTE — Op Note (Signed)
[facility] Patient Name: Oakley Kossman Procedure Date: 12/28/2017 9:18 AM MRN: [id number] Date of Birth: 1932-06-29 Attending MD: Hildred Laser , MD CSN: [id number] Age: 84 Admit Type: Inpatient Procedure:                Colonoscopy Indications:              Unexplained iron deficiency anemia Providers:                Hildred Laser, MD, Lurline Del, RN, Nelma Rothman,                            Technician Referring MD:             Irwin Brakeman, MD Medicines:                Propofol per Anesthesia Complications:            No immediate complications. Estimated Blood Loss:     Estimated blood loss was minimal. Procedure:                Pre-Anesthesia Assessment:                           - Prior to the procedure, a History and Physical                            was performed, and patient medications and                            allergies were reviewed. The patient's tolerance of                            previous anesthesia was also reviewed. The risks                            and benefits of the procedure and the sedation                            options and risks were discussed with the patient.                            All questions were answered, and informed consent                            was obtained. Prior Anticoagulants: The patient                            last took aspirin 7 days prior to the procedure.                            ASA Grade Assessment: IV - A patient with severe                            systemic disease that is a constant threat to life.  After reviewing the risks and benefits, the patient                            was deemed in satisfactory condition to undergo the                            procedure.                           After obtaining informed consent, the colonoscope                            was passed under direct vision. Throughout the                            procedure, the patient's blood  pressure, pulse, and                            oxygen saturations were monitored continuously. The                            [id number]-[id number] ([id number]) scope was introduced through                            the anus and advanced to the the cecum, identified                            by appendiceal orifice and ileocecal valve. The                            colonoscopy was performed without difficulty. The                            patient tolerated the procedure well. The quality                            of the bowel preparation was adequate. The                            ileocecal valve, appendiceal orifice, and rectum                            were photographed. Scope In: 9:21:58 AM Scope Out: 9:37:26 AM Scope Withdrawal Time: 0 hours 10 minutes 47 seconds  Total Procedure Duration: 0 hours 15 minutes 28 seconds  Findings:      The perianal and digital rectal examinations were normal.      A 5 mm polyp was found in the mid transverse colon. The polyp was       removed with a cold snare. Resection and retrieval were complete.      Scattered medium-mouthed diverticula were found in the sigmoid colon.      External hemorrhoids were found during retroflexion. The hemorrhoids       were small. Impression:               - One  5 mm polyp in the mid transverse colon,                            removed with a cold snare. Resected and retrieved.                           - Diverticulosis in the sigmoid colon.                           - External hemorrhoids. Moderate Sedation:      Per Anesthesia Care Recommendation:           - Patient has a contact number available for                            emergencies. The signs and symptoms of potential                            delayed complications were discussed with the                            patient. Return to normal activities tomorrow.                            Written discharge instructions were provided to the                             patient.                           - Diabetic (ADA) diet today.                           - Continue present medications.                           - No aspirin, ibuprofen, naproxen, or other                            non-steroidal anti-inflammatory drugs for 1 day.                           - Await pathology results.                           - No recommendation at this time regarding repeat                            colonoscopy. Procedure Code(s):        --- Professional ---                           [phone], Colonoscopy, flexible; with removal of                            tumor(s), polyp(s), or other lesion(s) by snare  technique Diagnosis Code(s):        --- Professional ---                           D12.3, Benign neoplasm of transverse colon (hepatic                            flexure or splenic flexure)                           K64.4, Residual hemorrhoidal skin tags                           D50.9, Iron deficiency anemia, unspecified                           K57.30, Diverticulosis of large intestine without                            perforation or abscess without bleeding CPT copyright 2017 [employer]. All rights reserved. The codes documented in this report are preliminary and upon coder review may  be revised to meet current compliance requirements. Hildred Laser, MD Hildred Laser, MD 12/28/2017 9:59:03 AM This report has been signed electronically. Number of Addenda: 0

## 2017-12-28 NOTE — Discharge Summary (Addendum)
Physician Discharge Summary  [name] PTW:[id number] DOB: 04-04-1932 DOA: 12/25/2017  PCP: Chipper Herb, MD  Admit date: 12/25/2017 Discharge date: 12/28/2017  Admitted From: Home  Disposition: Home with [facility] (declined SNF) Recommendations for Outpatient Follow-up:  1. Follow up with PCP in 1 weeks 2. Follow up with cardiology in 1 month 3. Outpatient event monitor arranged by cardiology  Discharge Condition: STABLE   CODE STATUS: FULL    Brief Hospitalization Summary: Please see all hospital notes, images, labs for full details of the hospitalization.  HPI: [name] is a 84 y.o. male with medical history significant of type II diabetes, hypertension, hyperlipidemia, coronary artery disease, history of aortic valve replacement, BPH being brought into the hospital after being found on the floor.  Patient does not recall falling.  States he remembers feeling lightheaded when walking this morning and that the fire department brought him to the hospital.  Denies having any chest pain or shortness of breath.  Denies having any abdominal pain, hematochezia, or melena at present.  Does report noticing dark stools 2 to 3 months ago.  He is not sure if he has had a colonoscopy before.  Denies noticing any change in his appetite but does report losing 8 pounds in the past 3 months.  Per chart review, patient was seen in the ED on December 19, 2017 for a fall.  At that ED visit, chest x-ray with no acute cardiopulmonary disease.  CT head was negative.  X-rays of hip and pelvis bilateral with evidence of degenerative joint disease but no fracture. He was found to have a hemoglobin of 7.9.  It was recommended that the patient be admitted but he refused at that time.  ED Course: Vitals on arrival-afebrile, pulse 93, respiratory rate 14, blood pressure 151/76, and SPO2 100% on room air.  Labs showing hemoglobin 8.1. Troponin 0.03.  UA not suggestive of infection.  Brief Admission Hx: [name]  [name] a 84 y.o.malewith medical history significant oftype IIdiabetes, hypertension, hyperlipidemia, coronary artery disease, history of aortic valve replacement, BPHbeing brought into the hospital after being found on the floor.  He apparently has had severe incidents of passing out and falling.  Was seen in ED last month but refused admission.  He was anemia at that time.  His PCP had referred him to outpatient GI for anemia but he had not yet established care.  Pt poor historian but apparently has not ever had a GI evaluation done in the past.  He was admitted for symptomatic anemia, suspected syncopal episode and for a blood transfusion.    MDM/Assessment & Plan:   1. Symptomatic anemia - He has been transfused 1 unit PRBCs.  GI performed TCS on 10/5 - results below.   2. Syncopal episodes - Pt has had several of these episodes.  Appreciate cardiology consult.  They have arranged for a 21 day event monitor and outpatient follow up.    3. Chronic diastolic Heart failure - Pt appears compensated at this time.   He did have a recent echo 7/19 with preserved EF and grade 1 DD with noted aortic valve bioprosthetic.  4. CAD - no chest pain symptoms.   5. Depression / chronic - resume home escitalopram but dose lowered to 10 mg daily for safety per pharmacy recommendations. 6. Type 2 diabetes mellitus - resume home treatment plan.  7. Essential hypertension - resumed home blood pressure medications.  8. Hyperlipidemia - resume home rosuvastatin.  9. Sepsis ruled  out  DVT prophylaxis: SCDs Code Status: Full  Family Communication: patient  Disposition Plan: Family decided to take home, refuses SNF, agreed to [facility] PT/RN/SW  Consultants:  Cardio  GI  Procedures:  Brief EGD and colonoscopy note.  Moderate sized sliding hiatal hernia. Erosive gastroduodenitis.  5 mm polyp cold snare from transverse colon. Sigmoid colon diverticulosis. Small external hemorrhoids.  Discharge  Diagnoses:  Principal Problem:   Symptomatic anemia Active Problems:   Type 2 diabetes mellitus with hyperlipidemia (HCC)   Hyperlipidemia LDL goal <70   CAD (coronary artery disease)   Essential hypertension   Depression   Syncope   Syncope and collapse   Fall   Sepsis [facility])  Discharge Instructions: Discharge Instructions    Increase activity slowly   Complete by:  As directed      Allergies as of 12/28/2017   No Known Allergies     Medication List    TAKE these medications   ACCU-CHEK SOFTCLIX LANCETS lancets Use as instructed   aspirin 81 MG tablet Take 1 tablet (81 mg total) by mouth every morning.   escitalopram 20 MG tablet Commonly known as:  LEXAPRO Take 0.5 tablets (10 mg total) by mouth every evening. What changed:  how much to take   ferrous sulfate 325 (65 FE) MG tablet Take 1 tablet (325 mg total) by mouth daily with breakfast for 14 days. Start taking on:  12/29/2017   furosemide 20 MG tablet Commonly known as:  LASIX Take 1 tablet (20 mg total) by mouth every other day. Start taking on:  12/29/2017 What changed:  when to take this   glucose blood test strip Test BS QD and PRN   lisinopril 10 MG tablet Commonly known as:  PRINIVIL,ZESTRIL Take 1 tablet (10 mg total) by mouth every morning.   metFORMIN 500 MG tablet Commonly known as:  GLUCOPHAGE Take 1 tablet (500 mg total) by mouth 2 (two) times daily.   metoprolol succinate 25 MG 24 hr tablet Commonly known as:  TOPROL-XL Take 1 tablet (25 mg total) by mouth every morning.   nitroGLYCERIN 0.4 MG SL tablet Commonly known as:  NITROSTAT Place 1 tablet (0.4 mg total) under the tongue every 5 (five) minutes x 3 doses as needed for chest pain.   pantoprazole 40 MG tablet Commonly known as:  PROTONIX Take 1 tablet (40 mg total) by mouth 2 (two) times daily before a meal.   rosuvastatin 40 MG tablet Commonly known as:  CRESTOR Take 1 tablet (40 mg total) by mouth every evening.       Follow-up Information    Chipper Herb, MD. Schedule an appointment as soon as possible for a visit in 1 week(s).   Specialty:  Family Medicine Contact information: [place] [address] [phone]        Minus Breeding, MD. Schedule an appointment as soon as possible for a visit in 1 month(s).   Specialty:  Cardiology Contact information: [address] [place] [address] [phone]        Carlis Stable, NP. Schedule an appointment as soon as possible for a visit in 2 week(s).   Specialty:  Gastroenterology Why:  Hospital Follow Up  Contact information: [address] [place] [address] [phone]          No Known Allergies Allergies as of 12/28/2017   No Known Allergies     Medication List    TAKE these medications   ACCU-CHEK  SOFTCLIX LANCETS lancets Use as instructed   aspirin 81 MG tablet Take 1 tablet (81 mg total) by mouth every morning.   escitalopram 20 MG tablet Commonly known as:  LEXAPRO Take 0.5 tablets (10 mg total) by mouth every evening. What changed:  how much to take   ferrous sulfate 325 (65 FE) MG tablet Take 1 tablet (325 mg total) by mouth daily with breakfast for 14 days. Start taking on:  12/29/2017   furosemide 20 MG tablet Commonly known as:  LASIX Take 1 tablet (20 mg total) by mouth every other day. Start taking on:  12/29/2017 What changed:  when to take this   glucose blood test strip Test BS QD and PRN   lisinopril 10 MG tablet Commonly known as:  PRINIVIL,ZESTRIL Take 1 tablet (10 mg total) by mouth every morning.   metFORMIN 500 MG tablet Commonly known as:  GLUCOPHAGE Take 1 tablet (500 mg total) by mouth 2 (two) times daily.   metoprolol succinate 25 MG 24 hr tablet Commonly known as:  TOPROL-XL Take 1 tablet (25 mg total) by mouth every morning.   nitroGLYCERIN 0.4 MG SL tablet Commonly known as:  NITROSTAT Place 1 tablet (0.4 mg total) under the tongue every 5  (five) minutes x 3 doses as needed for chest pain.   pantoprazole 40 MG tablet Commonly known as:  PROTONIX Take 1 tablet (40 mg total) by mouth 2 (two) times daily before a meal.   rosuvastatin 40 MG tablet Commonly known as:  CRESTOR Take 1 tablet (40 mg total) by mouth every evening.       Procedures/Studies: Ct Head Wo Contrast  Result Date: 12/19/2017 CLINICAL DATA:  Found at home on the ground. Altered mental status. Noncompliant with medications. Dementia. EXAM: CT HEAD WITHOUT CONTRAST TECHNIQUE: Contiguous axial images were obtained from the base of the skull through the vertex without intravenous contrast. COMPARISON:  03/28/2017. FINDINGS: Brain: No evidence for acute infarction, hemorrhage, mass lesion, or extra-axial fluid. Advanced atrophy. Suspected hydrocephalus ex vacuo. Normal pressure hydrocephalus is less likely. Chronic microvascular ischemic change. Vascular: Calcification of the cavernous internal carotid arteries, distal vertebral arteries, and basilar artery, consistent with cerebrovascular atherosclerotic disease. No signs of intracranial large vessel occlusion. Skull: Normal. Negative for fracture or focal lesion. Sinuses/Orbits: No acute finding. Other: Compared with the study in January 2019, similar appearance. IMPRESSION: Stable chronic findings most consistent with atrophy and small vessel disease. No acute intracranial abnormality. No intracranial hemorrhage or skull fracture. No signs of large vessel occlusion. Advanced cerebrovascular atherosclerosis. Electronically Signed   By: Staci Righter M.D.   On: 12/19/2017 16:49   US Carotid Bilateral  Result Date: 12/26/2017 CLINICAL DATA:  Syncope and collapse EXAM: BILATERAL CAROTID DUPLEX ULTRASOUND TECHNIQUE: Pearline Cables scale imaging, color Doppler and duplex ultrasound were performed of bilateral carotid and vertebral arteries in the neck. COMPARISON:  None. FINDINGS: Criteria: Quantification of carotid stenosis is based  on velocity parameters that correlate the residual internal carotid diameter with NASCET-based stenosis levels, using the diameter of the distal internal carotid lumen as the denominator for stenosis measurement. The following velocity measurements were obtained: RIGHT ICA: 55/15 cm/sec CCA: 69/48 cm/sec SYSTOLIC ICA/CCA RATIO:  0.8 ECA: 70 cm/sec LEFT ICA: 39/13 cm/sec CCA: 54/62 cm/sec SYSTOLIC ICA/CCA RATIO:  0.6 ECA: 85 cm/sec RIGHT CAROTID ARTERY: Mild smooth plaque in the common carotid artery. Partially calcified plaque in the carotid bulb extending into proximal internal and external carotid arteries without high-grade stenosis. Normal waveforms and  color Doppler signal. Distal ICA tortuous. RIGHT VERTEBRAL ARTERY:  Normal flow direction and waveform. LEFT CAROTID ARTERY: Eccentric nonocclusive plaque in the proximal common carotid artery. Circumferential plaque in the bulb, partially calcified, extending into the proximal ICA. No high-grade stenosis. Normal waveforms and color Doppler signal. Distal ICA mildly tortuous. LEFT VERTEBRAL ARTERY:  Normal flow direction and waveform. IMPRESSION: 1. Bilateral carotid bifurcation and proximal ICA plaque resulting in less than 50% diameter stenosis. 2.  Antegrade bilateral vertebral arterial flow. Electronically Signed   By: Lucrezia Europe M.D.   On: 12/26/2017 10:34   Dg Chest Port 1 View  Result Date: 12/19/2017 CLINICAL DATA:  Patient found on floor by social worker today. History of hypertension. Former smoker. EXAM: PORTABLE CHEST 1 VIEW COMPARISON:  11/03/2017 FINDINGS: Stable changes from prior cardiac surgery. Cardiac silhouette is normal in size. No mediastinal or hilar masses. Clear lungs.  No pleural effusion or pneumothorax. Skeletal structures are grossly intact. IMPRESSION: No acute cardiopulmonary disease. Electronically Signed   By: Lajean Manes M.D.   On: 12/19/2017 14:13   Dg Hips Bilat W Or Wo Pelvis 3-4 Views  Result Date:  12/19/2017 CLINICAL DATA:  Fall, found at home on ground, noncompliant with medications. EXAM: DG HIP (WITH OR WITHOUT PELVIS) 3-4V BILAT COMPARISON:  None. FINDINGS: There is no evidence of hip fracture or dislocation. There is no evidence of erosive arthropathy or other focal bone abnormality. BILATERAL joint space narrowing. Fecal impaction. Increased stool burden. Vascular calcification. IMPRESSION: Negative for hip fracture. BILATERAL DJD. Increased stool burden with fecal impaction. Electronically Signed   By: Staci Righter M.D.   On: 12/19/2017 16:46      Subjective: Pt feeling much better, without any complaints. Wants to go home. Refuses SNF.     Discharge Exam: Vitals:   12/28/17 1000 12/28/17 1051  BP: 138/76 (!) 147/82  Pulse: 74 82  Resp: 18 18  Temp:    SpO2: 96% 100%   Vitals:   12/28/17 0849 12/28/17 0945 12/28/17 1000 12/28/17 1051  BP: 130/86 101/74 138/76 (!) 147/82  Pulse:  60 74 82  Resp:  18 18 18   Temp:  97.7 F (36.5 C)    TempSrc:      SpO2:  97% 96% 100%  Weight:      Height:       General: Pt is alert, awake, not in acute distress Cardiovascular: RRR, S1/S2 +, no rubs, no gallops Respiratory: CTA bilaterally, no wheezing, no rhonchi Abdominal: Soft, NT, ND, bowel sounds + Extremities: no edema, no cyanosis   The results of significant diagnostics from this hospitalization (including imaging, microbiology, ancillary and laboratory) are listed below for reference.     Microbiology: No results found for this or any previous visit (from the past 240 hour(s)).   Labs: BNP (last 3 results) Recent Labs    12/19/17 1350  BNP 29.9   Basic Metabolic Panel: Recent Labs  Lab 12/24/17 1240 12/25/17 1649 12/27/17 0907 12/28/17 0621  NA 139 137 138 137  K 4.5 3.7 4.1 3.8  CL 101 105 105 104  CO2 26 25 26 25   GLUCOSE 112* 142* 110* 100*  BUN 12 9 8  7*  CREATININE 1.24 0.83 0.98 0.98  CALCIUM 9.1 8.6* 8.6* 8.6*   Liver Function Tests: No  results for input(s): AST, ALT, ALKPHOS, BILITOT, PROT, ALBUMIN in the last 168 hours. No results for input(s): LIPASE, AMYLASE in the last 168 hours. No results for input(s): AMMONIA in the  last 168 hours. CBC: Recent Labs  Lab 12/24/17 1240 12/25/17 1649 12/26/17 0119 12/26/17 1429 12/27/17 0907 12/28/17 0621  WBC 8.6 7.2 6.9 7.4 7.6 7.9  NEUTROABS 6.2 5.1  --  5.1  --   --   HGB 8.5* 8.1* 7.5* 9.1* 8.9* 9.3*  HCT 27.9* 27.8* 25.3* 30.1* 29.3* 31.1*  MCV 74* 76.6* 76.2* 78.4 78.8 78.3  PLT 398 350 309 307 293 321   Cardiac Enzymes: Recent Labs  Lab 12/25/17 1649  TROPONINI 0.03*   BNP: Invalid input(s): POCBNP CBG: Recent Labs  Lab 12/27/17 2133 12/28/17 0735 12/28/17 0848 12/28/17 0950 12/28/17 1050  GLUCAP 102* 89 85 101* 83   D-Dimer No results for input(s): DDIMER in the last 72 hours. Hgb A1c No results for input(s): HGBA1C in the last 72 hours. Lipid Profile No results for input(s): CHOL, HDL, LDLCALC, TRIG, CHOLHDL, LDLDIRECT in the last 72 hours. Thyroid function studies No results for input(s): TSH, T4TOTAL, T3FREE, THYROIDAB in the last 72 hours.  Invalid input(s): FREET3 Anemia work up Recent Labs    12/26/17 1429  FERRITIN 15*  TIBC 352  IRON 81   Urinalysis    Component Value Date/Time   COLORURINE YELLOW 12/25/2017 1923   APPEARANCEUR CLEAR 12/25/2017 1923   APPEARANCEUR Clear 07/24/2016 1110   LABSPEC 1.009 12/25/2017 1923   PHURINE 7.0 12/25/2017 1923   GLUCOSEU NEGATIVE 12/25/2017 1923   HGBUR NEGATIVE 12/25/2017 [place] NEGATIVE 12/25/2017 1923   BILIRUBINUR Negative 07/24/2016 [place] 12/25/2017 1923   PROTEINUR NEGATIVE 12/25/2017 1923   UROBILINOGEN negative 08/23/2014 1030   UROBILINOGEN 0.2 08/21/2010 1150   NITRITE NEGATIVE 12/25/2017 1923   LEUKOCYTESUR NEGATIVE 12/25/2017 1923   LEUKOCYTESUR Negative 07/24/2016 1110   Sepsis Labs Invalid input(s): PROCALCITONIN,  WBC,   LACTICIDVEN Microbiology No results found for this or any previous visit (from the past 240 hour(s)).  Time coordinating discharge: 34 mins  SIGNED:  Irwin Brakeman, MD  [facility] 12/28/2017, 11:31 AM Pager [phone]  If 7PM-7AM, please contact night-coverage [email or web address] Password [email or web address]

## 2017-12-28 NOTE — Progress Notes (Signed)
Patient's daughter states understanding of discharge instructions, case manager setting up home health

## 2017-12-28 NOTE — Care Management Note (Signed)
Case Management Note  Patient Details  Name: DEVIAN BARTOLOMEI MRN: [id number] Date of Birth: [birth year]  Subjective/Objective:                 Pt d/c home with family.  Action/Plan: Spoke with daughter who chooses [facility] to provide [facility] services.  Referral called to [place] liaison. Care will start within 48 hours.   Expected Discharge Date:  12/28/17               Expected Discharge Plan:  [place]  In-House Referral:  NA  Discharge planning Services  CM Consult  Post Acute Care Choice:  Home Health Choice offered to:  Adult Children  DME Arranged:  N/A DME Agency:  NA  HH Arranged:  PT, RN, Social Work [employer] Agency:  [facility]  Status of Service:  Completed, signed off  If discussed at [employer], dates discussed:    Additional Comments:  Claudie Leach, RN 12/28/2017, 12:28 PM

## 2017-12-28 NOTE — Anesthesia Preprocedure Evaluation (Signed)
Anesthesia Evaluation  Patient identified by MRN, date of birth, ID band Patient awake    Reviewed: Allergy & Precautions, H&P , NPO status , Patient's Chart, lab work & pertinent test results, reviewed documented beta blocker date and time   Airway Mallampati: II  TM Distance: >3 FB Neck ROM: full    Dental no notable dental hx. (+) Teeth Intact, Partial Upper, Partial Lower, Dental Advidsory Given   Pulmonary neg pulmonary ROS, former smoker,    Pulmonary exam normal breath sounds clear to auscultation       Cardiovascular Exercise Tolerance: Good hypertension, + angina + CAD  negative cardio ROS   Rhythm:regular Rate:Normal     Neuro/Psych PSYCHIATRIC DISORDERS Depression negative neurological ROS  negative psych ROS   GI/Hepatic negative GI ROS, Neg liver ROS,   Endo/Other  negative endocrine ROSdiabetes, Type 2  Renal/GU negative Renal ROS  negative genitourinary   Musculoskeletal   Abdominal   Peds  Hematology negative hematology ROS (+) anemia ,   Anesthesia Other Findings Orthostatic hypotension, syncope, falls Anemia Hgb 9s CAD s/p CABG w/ Ao valve replacement 6/12   Reproductive/Obstetrics negative OB ROS                             Anesthesia Physical Anesthesia Plan  ASA: IV  Anesthesia Plan: MAC   Post-op Pain Management:    Induction:   PONV Risk Score and Plan:   Airway Management Planned:   Additional Equipment:   Intra-op Plan:   Post-operative Plan:   Informed Consent: I have reviewed the patients History and Physical, chart, labs and discussed the procedure including the risks, benefits and alternatives for the proposed anesthesia with the patient or authorized representative who has indicated his/her understanding and acceptance.   Dental Advisory Given  Plan Discussed with: CRNA and Anesthesiologist  Anesthesia Plan Comments:          Anesthesia Quick Evaluation

## 2017-12-28 NOTE — Anesthesia Postprocedure Evaluation (Signed)
Anesthesia Post Note  Patient: [name]  Procedure(s) Performed: ESOPHAGOGASTRODUODENOSCOPY (EGD) WITH PROPOFOL (N/A ) COLONOSCOPY WITH PROPOFOL (N/A ) POLYPECTOMY  Anesthesia Type: MAC     Last Vitals:  Vitals:   12/28/17 0841 12/28/17 0849  BP: (!) 139/95 130/86  Pulse: 87   Resp: 20   Temp:    SpO2: 94%     Last Pain:  Vitals:   12/28/17 0942  TempSrc:   PainSc: 0-No pain                 Darnell Level [name]

## 2017-12-28 NOTE — Progress Notes (Signed)
  Subjective:  Patient has no complaints.  He denies shortness of breath chest pain or abdominal pain.  He also denies abdominal pain melena or rectal bleeding.  According to nursing staff he was able to finish the prep yesterday afternoon.  His stool was clear.  Objective: Blood pressure (!) 139/95, pulse 87, temperature 98.7 F (37.1 C), temperature source Oral, resp. rate 20, height 5\' 9"  (1.753 m), weight 81.6 kg, SpO2 94 %. Patient is alert and in no acute distress. He responds appropriately to questions. Neck veins not distended. Cardiac exam with regular rhythm normal S1 with prominent S2 and grade 2/6 systolic ejection murmur best heard at aortic area. Lungs are clear to auscultation. Abdomen is soft and nontender with organomegaly or masses. No LE edema noted.  Labs/studies Results:  Recent Labs    January 17, 2018 1429 12/27/17 0907 12/28/17 0621  WBC 7.4 7.6 7.9  HGB 9.1* 8.9* 9.3*  HCT 30.1* 29.3* 31.1*  PLT 307 293 321    BMET  Recent Labs    12/25/17 1649 12/27/17 0907 12/28/17 0621  NA 137 138 137  K 3.7 4.1 3.8  CL 105 105 104  CO2 25 26 25   GLUCOSE 142* 110* 100*  BUN 9 8 7*  CREATININE 0.83 0.98 0.98  CALCIUM 8.6* 8.6* 8.6*      Assessment:  #1.  Iron deficiency anemia.  GI bleed not documented.  His stool was guaiac negative.  Iron studies consistent with iron deficiency anemia.  Patient has received 1 unit of PRBCs.  He is hemodynamically stable to proceed with endoscopic evaluation.  #2.  History of diastolic CHF.  Patient appears to be in compensated state.  #3.  History of aortic valve disease.  Status post AVR with bioprosthetic valve.  Plan:  Will proceed with diagnostic EGD and colonoscopy.

## 2017-12-28 NOTE — Op Note (Signed)
[facility] Patient Name: [name] Procedure Date: 12/28/2017 8:24 AM MRN: [id number] Date of Birth: 05-05-1932 Attending MD: Hildred Laser , MD CSN: [id number] Age: 84 Admit Type: Inpatient Procedure:                Upper GI endoscopy Indications:              Unexplained iron deficiency anemia Providers:                Hildred Laser, MD, Lurline Del, RN, Nelma Rothman,                            Technician Referring MD:             Irwin Brakeman, MD Medicines:                Propofol per Anesthesia Complications:            No immediate complications. Estimated Blood Loss:     Estimated blood loss: none. Procedure:                Pre-Anesthesia Assessment:                           - Prior to the procedure, a History and Physical                            was performed, and patient medications and                            allergies were reviewed. The patient's tolerance of                            previous anesthesia was also reviewed. The risks                            and benefits of the procedure and the sedation                            options and risks were discussed with the patient.                            All questions were answered, and informed consent                            was obtained. Prior Anticoagulants: The patient                            last took aspirin 7 days prior to the procedure.                            ASA Grade Assessment: IV - A patient with severe                            systemic disease that is a constant threat to life.  After reviewing the risks and benefits, the patient                            was deemed in satisfactory condition to undergo the                            procedure.                           After obtaining informed consent, the endoscope was                            passed under direct vision. Throughout the                            procedure, the patient's blood  pressure, pulse, and                            oxygen saturations were monitored continuously. The                            [id number]-H190 ([id number]) scope was introduced through the                            and advanced to the second part of duodenum. The                            upper GI endoscopy was accomplished without                            difficulty. The patient tolerated the procedure                            well. The upper GI endoscopy was accomplished                            without difficulty. The patient tolerated the                            procedure well. Scope In: 9:13:36 AM Scope Out: 9:17:24 AM Total Procedure Duration: 0 hours 3 minutes 48 seconds  Findings:      The examined esophagus was normal.      The Z-line was regular and was found 34 cm from the incisors.      A 6 cm hiatal hernia was present.      A few erosions were found in the gastric antrum. Small polyp at gastric       body.      The exam of the stomach was otherwise normal.      A few erosions without bleeding were found in the duodenal bulb.      The second portion of the duodenum was normal. Impression:               - Normal esophagus.                           -  Z-line regular, 34 cm from the incisors.                           - 6 cm hiatal hernia.                           - Erosive gastropathy.                           - Small polyp at gastric body. Itwas left alone.                           - Duodenal erosions without bleeding.                           - Normal second portion of the duodenum.                           - No specimens collected. Moderate Sedation:      Per Anesthesia Care Recommendation:           - Return patient to hospital ward for ongoing care.                           - Diabetic (ADA) diet today.                           - Continue present medications.                           - H. Pylori serology.                           - See the other procedure  note for documentation of                            additional recommendations. Procedure Code(s):        --- Professional ---                           [id number], Esophagogastroduodenoscopy, flexible,                            transoral; diagnostic, including collection of                            specimen(s) by brushing or washing, when performed                            (separate procedure) Diagnosis Code(s):        --- Professional ---                           K44.9, Diaphragmatic hernia without obstruction or                            gangrene  K31.89, Other diseases of stomach and duodenum                           K26.9, Duodenal ulcer, unspecified as acute or                            chronic, without hemorrhage or perforation                           D50.9, Iron deficiency anemia, unspecified CPT copyright 2017 American Medical Association. All rights reserved. The codes documented in this report are preliminary and upon coder review may  be revised to meet current compliance requirements. Hildred Laser, MD Hildred Laser, MD 12/28/2017 9:50:31 AM This report has been signed electronically. Number of Addenda: 0

## 2017-12-28 NOTE — Progress Notes (Signed)
Brief EGD and colonoscopy note.  Moderate sized sliding hiatal hernia. Erosive gastroduodenitis.  5 mm polyp cold snare from transverse colon. Sigmoid colon diverticulosis. Small external hemorrhoids.

## 2017-12-28 NOTE — Transfer of Care (Signed)
Immediate Anesthesia Transfer of Care Note  Patient: [name]  Procedure(s) Performed: ESOPHAGOGASTRODUODENOSCOPY (EGD) WITH PROPOFOL (N/A ) COLONOSCOPY WITH PROPOFOL (N/A ) POLYPECTOMY  Patient Location: PACU  Anesthesia Type:MAC  Level of Consciousness: awake and sedated  Airway & Oxygen Therapy: Patient Spontanous Breathing  Post-op Assessment: Report given to RN and Patient moving all extremities  Post vital signs: Reviewed and stable  Last Vitals:  Vitals Value Taken Time  BP 101/74 12/28/2017  9:46 AM  Temp 97.7   Pulse 81 12/28/2017  9:49 AM  Resp 16 12/28/2017  9:49 AM  SpO2 97 % 12/28/2017  9:49 AM  Vitals shown include unvalidated device data.  Last Pain:  Vitals:   12/28/17 0942  TempSrc:   PainSc: 0-No pain      Patients Stated Pain Goal: 7 (72/18/28 8337)  Complications: No apparent anesthesia complications

## 2017-12-29 ENCOUNTER — Ambulatory Visit (INDEPENDENT_AMBULATORY_CARE_PROVIDER_SITE_OTHER): Payer: Medicare HMO | Admitting: Internal Medicine

## 2017-12-29 ENCOUNTER — Encounter (HOSPITAL_COMMUNITY): Payer: Self-pay | Admitting: Internal Medicine

## 2017-12-29 DIAGNOSIS — E1169 Type 2 diabetes mellitus with other specified complication: Secondary | ICD-10-CM | POA: Diagnosis not present

## 2017-12-29 DIAGNOSIS — E785 Hyperlipidemia, unspecified: Secondary | ICD-10-CM | POA: Diagnosis not present

## 2017-12-29 DIAGNOSIS — R55 Syncope and collapse: Secondary | ICD-10-CM | POA: Diagnosis not present

## 2017-12-29 DIAGNOSIS — I5032 Chronic diastolic (congestive) heart failure: Secondary | ICD-10-CM | POA: Diagnosis not present

## 2017-12-29 DIAGNOSIS — I2510 Atherosclerotic heart disease of native coronary artery without angina pectoris: Secondary | ICD-10-CM | POA: Diagnosis not present

## 2017-12-29 DIAGNOSIS — K449 Diaphragmatic hernia without obstruction or gangrene: Secondary | ICD-10-CM | POA: Diagnosis not present

## 2017-12-29 DIAGNOSIS — I110 Hypertensive heart disease with heart failure: Secondary | ICD-10-CM | POA: Diagnosis not present

## 2017-12-29 DIAGNOSIS — D649 Anemia, unspecified: Secondary | ICD-10-CM | POA: Diagnosis not present

## 2017-12-29 DIAGNOSIS — K2990 Gastroduodenitis, unspecified, without bleeding: Secondary | ICD-10-CM | POA: Diagnosis not present

## 2017-12-29 LAB — H. PYLORI ANTIBODY, IGG: H Pylori IgG: 0.52 Index Value (ref 0.00–0.79)

## 2017-12-30 ENCOUNTER — Telehealth: Payer: Self-pay | Admitting: *Deleted

## 2017-12-30 NOTE — Telephone Encounter (Signed)
Please check and see what recommendations were given to the patient when he left the hospital as I think that he saw the cardiologist there recently.  We should follow their recommendations regarding blood pressure medicines and heart medicines.

## 2017-12-30 NOTE — Telephone Encounter (Signed)
Please resume lisinopril 10 mg daily and metoprolol 25 extended release or 24-hour 1 daily. Have home health to monitor blood pressures and pulse rates closely over the next week.  And to keep [place] posted with future readings.

## 2017-12-30 NOTE — Telephone Encounter (Signed)
VM from Arbie Cookey w/ [facility] Pt's BP last night was 100/58 HR 92 Pt had not had his BP meds yesterday he does not have them in his home Please advise on his BP and heart meds

## 2017-12-30 NOTE — Telephone Encounter (Signed)
Discharge summary states to resume home BP medications

## 2017-12-31 NOTE — Addendum Note (Signed)
Addendum  created 12/31/17 1010 by Vista Deck, CRNA   Intraprocedure Staff edited

## 2017-12-31 NOTE — Telephone Encounter (Signed)
[name] w/ [facility] aware of recommendations

## 2018-01-01 ENCOUNTER — Other Ambulatory Visit: Payer: Self-pay | Admitting: *Deleted

## 2018-01-01 ENCOUNTER — Telehealth: Payer: Self-pay | Admitting: Family Medicine

## 2018-01-01 DIAGNOSIS — E1169 Type 2 diabetes mellitus with other specified complication: Secondary | ICD-10-CM | POA: Diagnosis not present

## 2018-01-01 DIAGNOSIS — E785 Hyperlipidemia, unspecified: Secondary | ICD-10-CM | POA: Diagnosis not present

## 2018-01-01 DIAGNOSIS — I2510 Atherosclerotic heart disease of native coronary artery without angina pectoris: Secondary | ICD-10-CM | POA: Diagnosis not present

## 2018-01-01 DIAGNOSIS — I5032 Chronic diastolic (congestive) heart failure: Secondary | ICD-10-CM | POA: Diagnosis not present

## 2018-01-01 DIAGNOSIS — I110 Hypertensive heart disease with heart failure: Secondary | ICD-10-CM | POA: Diagnosis not present

## 2018-01-01 DIAGNOSIS — R55 Syncope and collapse: Secondary | ICD-10-CM | POA: Diagnosis not present

## 2018-01-01 DIAGNOSIS — K449 Diaphragmatic hernia without obstruction or gangrene: Secondary | ICD-10-CM | POA: Diagnosis not present

## 2018-01-01 DIAGNOSIS — K2990 Gastroduodenitis, unspecified, without bleeding: Secondary | ICD-10-CM | POA: Diagnosis not present

## 2018-01-01 DIAGNOSIS — D649 Anemia, unspecified: Secondary | ICD-10-CM | POA: Diagnosis not present

## 2018-01-01 NOTE — Telephone Encounter (Signed)
Please call [name] and make sure that he has appointment to be seen and that all of his blood work is stable before he starts driving anymore.

## 2018-01-01 NOTE — Patient Outreach (Addendum)
[facility]) Care Management  01/01/2018  [name] 09-27-1932 [id number]   Telephone Screen  Referral Date: 12/30/17 Referral Source:[employer] referral-Tier 4 high Risk Patient List Referral Reason: Tier 4 high Risk Patient List Insurance: Pecola Lawless  [facility] RN CM noted in the last 6 months 2 ED visits (12/19/17 fall no injury, low hgb & 12/25/17 leading to admission) and 1 admission - Last admission 12/25/17 to 12/28/17 for fall, anemia, syncope, chronic diastolic HF  Outreach attempt # 1 to his home number successful  His care giver answered did not allow CM to speak with him, referred CM to his daughter Caregiver reports there are services in the home already, she is in process of fixing his meal and he has a visitor at this time She reports she will tell [name] CM called and provided the daughter's work number  The caregiver is denying the pt is in need of any assistance from [facility] and reports that [name] is receiving assistance from [facility] RN, PT and SW He was offered snf but declined   [facility] RN CM called and spoke with the daughter, [name] The patient's daughter is able to verify HIPAA Reviewed and addressed referral to [facility] with patient's daughter [name] confirms a nurse is seeing [name] "Three times a week" but she voiced interest in not being able to stay at intervals with [name] in the evenings She showed interest in having someone from  6 pm -10 or  until he goes to sleep, not necessarily all night like possibly a sitter    Social: [name] lives at home and has a caregiver hired by the family to assist during the day. His daughter, [name], is his main support person.  "I'm all he has right now." per [name].  [name] also assists with home improvements in his home.  [name] is widowed His wife passed 2 years ago and [name] reports that he "misses her and talks about her everyday." He is taking Lexapro   Conditions:  Aortic valve disorder, CAD, unstable angina, orthostatic hypertension, HTN, syncope, Colonic polyps, Type 2 DM with hyperlipidemia, depression, BPH, iron deficiency anemia, palpitations, carotid bruit, former smoker, degenerative disc disease, chronic diastolic HF   DME accu check monitor, walker, cane, shower chair, elevated toilet seat, rails/grab bars in shower and near the door that [name] has assisted in installing    Medications: He get his 10 medicines delivered and in bubble/blister packs from [facility] pharmacy Flu shot received on  02/03/17 and they are to return to get one in the next few weeks  denies concerns with taking medications as prescribed, affording medications, side effects of medications and questions about medications   Appointments: Dr [name] primary MD to be seen on 01/21/18 and ws seen recently for hospital f/u, Dr [facility] to be seen on 02/11/18   Advance Directives: He does not have a living will or POA and is interested in assistance  Consent: [facility] RN CM reviewed [facility] services with patient. Patient's daughter gave verbal consent for services [facility] SW at this time  At this time [name] denied the need of [facility] community care coordinator, Control and instrumentation engineer, NP or pharmacy   Plan: [facility] RN CM will refer Joseph Ariail to [facility] SW for assistance with advanced directives, and community resources for sitters and possible socialization   [facility] RN CM sent a successful outreach letter as discussed with [facility] brochure enclosed for review  Ladora Osterberg L. Noelle Penner, RN, BSN, CCM [facility] Telephonic Care Management Care Coordinator Office number [phone] Mobile number [phone]  Main [facility] number [phone] Fax number [phone]

## 2018-01-02 DIAGNOSIS — K2990 Gastroduodenitis, unspecified, without bleeding: Secondary | ICD-10-CM | POA: Diagnosis not present

## 2018-01-02 DIAGNOSIS — I110 Hypertensive heart disease with heart failure: Secondary | ICD-10-CM | POA: Diagnosis not present

## 2018-01-02 DIAGNOSIS — I2510 Atherosclerotic heart disease of native coronary artery without angina pectoris: Secondary | ICD-10-CM | POA: Diagnosis not present

## 2018-01-02 DIAGNOSIS — D649 Anemia, unspecified: Secondary | ICD-10-CM | POA: Diagnosis not present

## 2018-01-02 DIAGNOSIS — I5032 Chronic diastolic (congestive) heart failure: Secondary | ICD-10-CM | POA: Diagnosis not present

## 2018-01-02 DIAGNOSIS — E785 Hyperlipidemia, unspecified: Secondary | ICD-10-CM | POA: Diagnosis not present

## 2018-01-02 DIAGNOSIS — E1169 Type 2 diabetes mellitus with other specified complication: Secondary | ICD-10-CM | POA: Diagnosis not present

## 2018-01-02 DIAGNOSIS — R55 Syncope and collapse: Secondary | ICD-10-CM | POA: Diagnosis not present

## 2018-01-02 DIAGNOSIS — K449 Diaphragmatic hernia without obstruction or gangrene: Secondary | ICD-10-CM | POA: Diagnosis not present

## 2018-01-05 ENCOUNTER — Other Ambulatory Visit: Payer: Self-pay | Admitting: *Deleted

## 2018-01-05 NOTE — Patient Outreach (Signed)
[facility]) Care Management  01/05/2018  [name] [birth year] [id number]   EMMI-general discharge   RED ON EMMI ALERT Day # 4 Date: 01/02/18 1431  Red Alert Reason: Lost of interest in things? Yes    Outreach attempt # [phone] (Home number) is an incorrect number A male informed CM that [name] "does not live here"  CM dialed the mobile number listed for [name] There is not voice mailbox setup for the mobile number  CM dialed the number listed on the EMMI alert [phone] and successfully reached [name]  Patient is able to verify HIPAA He gives permission for CM to speak with his daughter, [name] or his caregiver prn  [facility] Care Management RN reviewed and addressed red alert with patient plus the call contact on 04/03/17 with his caregiver and daughter  [name] reports he is doing well and the answer yes to the question, lost of interest in things is incorrect He denies having lost of interest and reports having various visitors  He thanked CM for calling him  Social:  [name] lives at home and has a caregiver hired by the family to assist during the day. His daughter, [name], is his main support person.  "I'm all he has right now." per [name].  [name]'s husband also assists with home improvements in his home.  [name] is widowed His wife passed 2 years ago and [name] reports that he "misses her and talks about her everyday." He is taking Lexapro   Conditions: Aortic valve disorder, CAD, unstable angina, orthostatic hypertension, HTN, syncope, Colonic polyps, Type 2 DM with hyperlipidemia, depression, BPH, iron deficiency anemia, palpitations, carotid bruit, former smoker, degenerative disc disease, chronic diastolic HF  Medications: denies concerns with taking medications as prescribed, affording medications, side effects of medications and questions about medications  Consent: [facility] RN CM reviewed [facility] services with patient. Patient gave verbal  consent for services. Advised patient that there will be further automated EMMI- post discharge calls to assess how the patient is doing following the recent hospitalization Advised the patient that another call may be received from a nurse if any of their responses were abnormal. Patient voiced understanding and was appreciative of f/u call. He denies need of services from [facility] Community/Telephonic RN CM, pharmacy, health coach, NP or SW at this time   Plan: [place] CM will close case at this time as patient has been assessed and no needs identified.   Pt encouraged to return a call to [facility] RN CM prn  Sentoria Brent L. Lavina Hamman, RN, BSN, [place] Coordinator Office number [phone] Mobile number [phone]  Main [facility] number [phone] Fax number [phone]

## 2018-01-06 ENCOUNTER — Other Ambulatory Visit: Payer: Self-pay

## 2018-01-06 ENCOUNTER — Encounter (HOSPITAL_COMMUNITY): Payer: Self-pay

## 2018-01-06 ENCOUNTER — Telehealth: Payer: Self-pay

## 2018-01-06 ENCOUNTER — Emergency Department (HOSPITAL_COMMUNITY)
Admission: EM | Admit: 2018-01-06 | Discharge: 2018-01-06 | Disposition: A | Discharge status: Home / Self Care | Payer: Medicare HMO | Attending: Emergency Medicine | Admitting: Emergency Medicine

## 2018-01-06 DIAGNOSIS — Z79899 Other long term (current) drug therapy: Secondary | ICD-10-CM | POA: Insufficient documentation

## 2018-01-06 DIAGNOSIS — E1169 Type 2 diabetes mellitus with other specified complication: Secondary | ICD-10-CM | POA: Diagnosis not present

## 2018-01-06 DIAGNOSIS — Z951 Presence of aortocoronary bypass graft: Secondary | ICD-10-CM | POA: Insufficient documentation

## 2018-01-06 DIAGNOSIS — Z7982 Long term (current) use of aspirin: Secondary | ICD-10-CM | POA: Diagnosis not present

## 2018-01-06 DIAGNOSIS — I950 Idiopathic hypotension: Secondary | ICD-10-CM

## 2018-01-06 DIAGNOSIS — I110 Hypertensive heart disease with heart failure: Secondary | ICD-10-CM | POA: Diagnosis not present

## 2018-01-06 DIAGNOSIS — E119 Type 2 diabetes mellitus without complications: Secondary | ICD-10-CM | POA: Diagnosis not present

## 2018-01-06 DIAGNOSIS — E785 Hyperlipidemia, unspecified: Secondary | ICD-10-CM | POA: Diagnosis not present

## 2018-01-06 DIAGNOSIS — I25110 Atherosclerotic heart disease of native coronary artery with unstable angina pectoris: Secondary | ICD-10-CM | POA: Insufficient documentation

## 2018-01-06 DIAGNOSIS — D649 Anemia, unspecified: Secondary | ICD-10-CM | POA: Insufficient documentation

## 2018-01-06 DIAGNOSIS — I959 Hypotension, unspecified: Secondary | ICD-10-CM | POA: Diagnosis present

## 2018-01-06 DIAGNOSIS — R55 Syncope and collapse: Secondary | ICD-10-CM | POA: Diagnosis not present

## 2018-01-06 DIAGNOSIS — Z87891 Personal history of nicotine dependence: Secondary | ICD-10-CM | POA: Diagnosis not present

## 2018-01-06 DIAGNOSIS — I2510 Atherosclerotic heart disease of native coronary artery without angina pectoris: Secondary | ICD-10-CM | POA: Diagnosis not present

## 2018-01-06 DIAGNOSIS — Z7984 Long term (current) use of oral hypoglycemic drugs: Secondary | ICD-10-CM | POA: Diagnosis not present

## 2018-01-06 DIAGNOSIS — I5032 Chronic diastolic (congestive) heart failure: Secondary | ICD-10-CM | POA: Diagnosis not present

## 2018-01-06 DIAGNOSIS — K2990 Gastroduodenitis, unspecified, without bleeding: Secondary | ICD-10-CM | POA: Diagnosis not present

## 2018-01-06 DIAGNOSIS — K449 Diaphragmatic hernia without obstruction or gangrene: Secondary | ICD-10-CM | POA: Diagnosis not present

## 2018-01-06 LAB — CBC WITH DIFFERENTIAL/PLATELET
Abs Immature Granulocytes: 0.02 10*3/uL (ref 0.00–0.07)
Basophils Absolute: 0 10*3/uL (ref 0.0–0.1)
Basophils Relative: 1 %
EOS ABS: 0.3 10*3/uL (ref 0.0–0.5)
EOS PCT: 4 %
HCT: 30.7 % — ABNORMAL LOW (ref 39.0–52.0)
Hemoglobin: 8.9 g/dL — ABNORMAL LOW (ref 13.0–17.0)
Immature Granulocytes: 0 %
Lymphocytes Relative: 22 %
Lymphs Abs: 1.9 10*3/uL (ref 0.7–4.0)
MCH: 22.8 pg — AB (ref 26.0–34.0)
MCHC: 29 g/dL — ABNORMAL LOW (ref 30.0–36.0)
MCV: 78.5 fL — AB (ref 80.0–100.0)
MONO ABS: 0.9 10*3/uL (ref 0.1–1.0)
MONOS PCT: 11 %
Neutro Abs: 5.1 10*3/uL (ref 1.7–7.7)
Neutrophils Relative %: 62 %
PLATELETS: 293 10*3/uL (ref 150–400)
RBC: 3.91 MIL/uL — ABNORMAL LOW (ref 4.22–5.81)
RDW: 16.6 % — AB (ref 11.5–15.5)
WBC: 8.3 10*3/uL (ref 4.0–10.5)
nRBC: 0 % (ref 0.0–0.2)

## 2018-01-06 LAB — BASIC METABOLIC PANEL
Anion gap: 7 (ref 5–15)
BUN: 23 mg/dL (ref 8–23)
CHLORIDE: 100 mmol/L (ref 98–111)
CO2: 27 mmol/L (ref 22–32)
Calcium: 8.8 mg/dL — ABNORMAL LOW (ref 8.9–10.3)
Creatinine, Ser: 1.73 mg/dL — ABNORMAL HIGH (ref 0.61–1.24)
GFR, EST AFRICAN AMERICAN: 40 mL/min — AB (ref >60)
GFR, EST NON AFRICAN AMERICAN: 35 mL/min — AB (ref >60)
Glucose, Bld: 120 mg/dL — ABNORMAL HIGH (ref 70–99)
POTASSIUM: 4.4 mmol/L (ref 3.5–5.1)
SODIUM: 134 mmol/L — AB (ref 135–145)

## 2018-01-06 LAB — BRAIN NATRIURETIC PEPTIDE: B Natriuretic Peptide: 128 pg/mL — ABNORMAL HIGH (ref 0.0–100.0)

## 2018-01-06 MED ORDER — SODIUM CHLORIDE 0.9 % IV BOLUS
500.0000 mL | Freq: Once | INTRAVENOUS | Status: AC
  Administered 2018-01-06: 500 mL via INTRAVENOUS

## 2018-01-06 NOTE — Telephone Encounter (Signed)
Tina with [facility] called back requesting that patient be given fluids tonight and more in the AM instead of going to the ER.  Spoke with Dr Darnell Level and she still would like to him to go to the ER due to possibility of sepsis. Otila Kluver and patients daughter verbalized understanding.

## 2018-01-06 NOTE — ED Triage Notes (Signed)
Pt's home health nurse came today to check on pt and noticed systolic was 38S. Stated pt was on 2 BP medications and should not be on both. NAD. No other complaints.

## 2018-01-06 NOTE — Discharge Instructions (Signed)
Please be sure to monitor your condition carefully, and do not hesitate for any concerning changes. Otherwise, stay well-hydrated, and be sure to follow-up with your physician.

## 2018-01-06 NOTE — Telephone Encounter (Signed)
Home health nurse called BP sitting 80/44  Standing 76/48  Pulse 84   Review with Dr. Darnell Level and she said he needs to go to the ER    Otila Kluver notified and she was going to call his daughter

## 2018-01-06 NOTE — ED Provider Notes (Addendum)
[facility] EMERGENCY DEPARTMENT Provider Note   CSN: [id number] Arrival date & time: 01/06/18  1708     History   Chief Complaint Chief Complaint  Patient presents with  . Hypotension    HPI [name] is a 84 y.o. male.  HPI Presents without complaints, but after home health nurse found him to be hypotensive. Patient is here with his daughter who assists with the HPI.  They note that since evaluation here about 2 weeks ago following an episode of near syncope, he has not yet had follow-up with his physician, but has seemingly started new antihypertensive medication. He notes that he has been doing, however, generally well, without new or syncope, syncope, falling, and he specifically denies any blood in his stool, hematemesis, or abdominal or any pain. Reportedly the patient had a systolic blood pressure of 80, decreased on upright positioning, and was referred here by nursing staff.  Past Medical History:  Diagnosis Date  . Anemia    Iron deficiency  . Aortic stenosis    s/p pericardial AVR  . Aortic valve disorder 06/23/2008   Qualifier: Diagnosis of  By: Mare Ferrari, RMA, Sherri    . Arthritis   . Benign prostatic hypertrophy   . CAD (coronary artery disease) 01/30/2011  . Carotid bruit 08/21/2016  . COLONIC POLYPS 06/23/2008   Qualifier: Diagnosis of  By: Mare Ferrari, RMA, Sherri    . Coronary artery disease    s/p CABG 08/2010    . Diabetes mellitus   . Dyslipidemia 10/15/2017  . Essential hypertension 08/21/2016  . H/O aortic valve replacement 08/21/2016  . Hyperlipidemia   . Hyperlipidemia LDL goal <70 06/23/2008   Qualifier: Diagnosis of  By: Mare Ferrari, RMA, Sherri    . Leg swelling 10/15/2017  . Orthostatic hypotension 06/22/2012  . Palpitations 08/21/2016  . Precordial pain 09/18/2011  . Type 2 diabetes mellitus with hyperlipidemia ([place]) 06/23/2008   Qualifier: Diagnosis of  By: Mare Ferrari, RMA, Sherri    . Unstable angina ([place]) 07/24/2011    Patient Active Problem List   Diagnosis Date Noted  . Depression 12/26/2017  . Sepsis ([place]) 12/26/2017  . Syncope   . Syncope and collapse   . Fall   . Symptomatic anemia 12/25/2017  . Dyslipidemia 10/15/2017  . Leg swelling 10/15/2017  . Palpitations 08/21/2016  . Essential hypertension 08/21/2016  . Carotid bruit 08/21/2016  . H/O aortic valve replacement 08/21/2016  . Anemia, iron deficiency 04/11/2014  . Orthostatic hypotension 06/22/2012  . Dizzy 06/22/2012  . Precordial pain 09/18/2011  . Unstable angina ([place]) 07/24/2011  . CAD (coronary artery disease) 01/30/2011  . COLONIC POLYPS 06/23/2008  . Type 2 diabetes mellitus with hyperlipidemia ([place]) 06/23/2008  . Hyperlipidemia LDL goal <70 06/23/2008  . Aortic valve disorder 06/23/2008  . BENIGN PROSTATIC HYPERTROPHY, HX OF 06/23/2008    Past Surgical History:  Procedure Laterality Date  . AORTIC VALVE REPLACEMENT  08/24/10   41mm pericardial tissue valve  . COLONOSCOPY WITH PROPOFOL N/A 12/28/2017   Procedure: COLONOSCOPY WITH PROPOFOL;  Surgeon: Rogene Houston, MD;  [facility] ENDO [facility];  Service: Endoscopy;  Laterality: N/A;  . CORONARY ARTERY BYPASS GRAFT  08/24/10   LIMA to LAD, SVG to ramus intermediate, SVG to diagonal  . ESOPHAGOGASTRODUODENOSCOPY (EGD) WITH PROPOFOL N/A 12/28/2017   Procedure: ESOPHAGOGASTRODUODENOSCOPY (EGD) WITH PROPOFOL;  Surgeon: Rogene Houston, MD;  Location: [facility] ENDO SUITE;  Service: Endoscopy;  Laterality: N/A;  . HERNIA REPAIR    . knee replacements  x 2  . POLYPECTOMY  12/28/2017   Procedure: POLYPECTOMY;  Surgeon: Rogene Houston, MD;  Location: [facility] ENDO SU[facility];  Service: Endoscopy;;  transverse colon         Home Medications    Prior to Admission medications   Medication Sig Start Date End Date Taking? Authorizing Provider  aspirin 81 MG tablet Take 1 tablet (81 mg total) by mouth every morning. 12/25/17  Yes Chipper Herb, MD  escitalopram (LEXAPRO) 20 MG tablet Take 0.5 tablets (10 mg total) by mouth  every evening. 12/26/17  Yes Johnson, Clanford L, MD  ferrous sulfate 325 (65 FE) MG tablet Take 1 tablet (325 mg total) by mouth daily with breakfast for 14 days. 12/29/17 01/12/18 Yes Johnson, Clanford L, MD  furosemide (LASIX) 20 MG tablet Take 1 tablet (20 mg total) by mouth every other day. 12/29/17  Yes Johnson, Clanford L, MD  lisinopril (PRINIVIL,ZESTRIL) 10 MG tablet Take 1 tablet (10 mg total) by mouth every morning. 12/25/17  Yes Chipper Herb, MD  metFORMIN (GLUCOPHAGE) 500 MG tablet Take 1 tablet (500 mg total) by mouth 2 (two) times daily. 12/25/17  Yes Chipper Herb, MD  metoprolol succinate (TOPROL-XL) 25 MG 24 hr tablet Take 1 tablet (25 mg total) by mouth every morning. 12/25/17  Yes Chipper Herb, MD  nitroGLYCERIN (NITROSTAT) 0.4 MG SL tablet Place 1 tablet (0.4 mg total) under the tongue every 5 (five) minutes x 3 doses as needed for chest pain. 08/21/16  Yes Minus Breeding, MD  pantoprazole (PROTONIX) 40 MG tablet Take 1 tablet (40 mg total) by mouth 2 (two) times daily before a meal. 12/26/17 01/25/18 Yes Johnson, Clanford L, MD  rosuvastatin (CRESTOR) 40 MG tablet Take 1 tablet (40 mg total) by mouth every evening. 12/25/17 03/25/18 Yes Chipper Herb, MD  ACCU-CHEK SOFTCLIX LANCETS lancets Use as instructed 09/15/15   Chipper Herb, MD  glucose blood (ACCU-CHEK AVIVA) test strip Test BS QD and PRN 11/23/15   Chipper Herb, MD    Family History Family History  Problem Relation Age of Onset  . Cancer Mother   . Diabetes Father   . Hypertension Other   . Diabetes Other     Social History Social History   Tobacco Use  . Smoking status: Former Smoker    Types: Cigars    Last attempt to quit: 09/17/1993    Years since quitting: 24.3  . Smokeless tobacco: Never Used  Substance Use Topics  . Alcohol use: Yes    Comment: occassional  . Drug use: No     Allergies   Patient has no known allergies.   Review of Systems Review of Systems  Constitutional:       Per  HPI, otherwise negative  HENT:       Per HPI, otherwise negative  Respiratory:       Per HPI, otherwise negative  Cardiovascular:       Per HPI, otherwise negative  Gastrointestinal: Negative for vomiting.  Endocrine:       Negative aside from HPI  Genitourinary:       Neg aside from HPI   Musculoskeletal:       Per HPI, otherwise negative  Skin: Negative.   Neurological: Negative for syncope and weakness.     Physical Exam Updated Vital Signs BP 122/67   Pulse 95   Temp 97.8 F (36.6 C) (Oral)   Resp (!) 22   SpO2 97%   Physical  Exam  Constitutional: He is oriented to person, place, and time. He appears well-developed. No distress.  HENT:  Head: Normocephalic and atraumatic.  Eyes: Conjunctivae and EOM are normal.  Cardiovascular: Normal rate and regular rhythm.  Pulmonary/Chest: Effort normal. No stridor. No respiratory distress.  Abdominal: He exhibits no distension.  Musculoskeletal: He exhibits no edema.  Neurological: He is alert and oriented to person, place, and time.  Skin: Skin is warm and dry.  Psychiatric: He has a normal mood and affect.  Nursing note and vitals reviewed.    ED Treatments / Results  Labs (all labs ordered are listed, but only abnormal results are displayed) Labs Reviewed  BASIC METABOLIC PANEL - Abnormal; Notable for the following components:      Result Value   Sodium 134 (*)    Glucose, Bld 120 (*)    Creatinine, Ser 1.73 (*)    Calcium 8.8 (*)    GFR calc non Af Amer 35 (*)    GFR calc Af Amer 40 (*)    All other components within normal limits  CBC WITH DIFFERENTIAL/PLATELET - Abnormal; Notable for the following components:   RBC 3.91 (*)    Hemoglobin 8.9 (*)    HCT 30.7 (*)    MCV 78.5 (*)    MCH 22.8 (*)    MCHC 29.0 (*)    RDW 16.6 (*)    All other components within normal limits  BRAIN NATRIURETIC PEPTIDE - Abnormal; Notable for the following components:   B Natriuretic Peptide 128.0 (*)    All other components  within normal limits     Procedures Procedures (including critical care time)  Medications Ordered in ED Medications  sodium chloride 0.9 % bolus 500 mL (0 mLs Intravenous Stopped 01/06/18 1922)  sodium chloride 0.9 % bolus 500 mL (0 mLs Intravenous Stopped 01/06/18 2050)     Initial Impression / Assessment and Plan / ED Course  I have reviewed the triage vital signs and the nursing notes.  Pertinent labs & imaging results that were available during my care of the patient were reviewed by me and considered in my medical decision making (see chart for details).     Echocardiogram from October 06, 2017 (3 months ago) with results as below:  Left ventricle:  The cavity size was normal. Wall thickness was increased in a pattern of mild LVH. There was focal basal hypertrophy. Systolic function was vigorous. The estimated ejection fraction was in the range of 65% to 70%. Doppler parameters are consistent with abnormal left ventricular relaxation (grade 1 diastolic dysfunction).   Review after the initial evaluation notable healthcare management notes notable for blood pressure abnormalities as above. 8:53 PM Repeat exam the patient is awake and alert, continues to deny complaints. Orthostasis has resolved, and the patient is hemodynamically unremarkable. With a lengthy conversation about today's findings, concern for hypovolemia, the patient will have change in his diuretics pending outpatient follow-up with primary care purulent absent complaints, absent evidence for ischemia, infection, and with improvement with fluid resuscitation is appropriate for close outpatient follow-up.  Final Clinical Impressions(s) / ED Diagnoses  Arterial hypotension   Carmin Muskrat, MD 01/06/18 2054    Carmin Muskrat, MD 01/06/18 2056

## 2018-01-07 ENCOUNTER — Telehealth: Payer: Self-pay | Admitting: *Deleted

## 2018-01-07 DIAGNOSIS — R55 Syncope and collapse: Secondary | ICD-10-CM | POA: Diagnosis not present

## 2018-01-07 DIAGNOSIS — I110 Hypertensive heart disease with heart failure: Secondary | ICD-10-CM | POA: Diagnosis not present

## 2018-01-07 DIAGNOSIS — I2510 Atherosclerotic heart disease of native coronary artery without angina pectoris: Secondary | ICD-10-CM | POA: Diagnosis not present

## 2018-01-07 DIAGNOSIS — E785 Hyperlipidemia, unspecified: Secondary | ICD-10-CM | POA: Diagnosis not present

## 2018-01-07 DIAGNOSIS — I5032 Chronic diastolic (congestive) heart failure: Secondary | ICD-10-CM | POA: Diagnosis not present

## 2018-01-07 DIAGNOSIS — K2990 Gastroduodenitis, unspecified, without bleeding: Secondary | ICD-10-CM | POA: Diagnosis not present

## 2018-01-07 DIAGNOSIS — E1169 Type 2 diabetes mellitus with other specified complication: Secondary | ICD-10-CM | POA: Diagnosis not present

## 2018-01-07 DIAGNOSIS — K449 Diaphragmatic hernia without obstruction or gangrene: Secondary | ICD-10-CM | POA: Diagnosis not present

## 2018-01-07 DIAGNOSIS — D649 Anemia, unspecified: Secondary | ICD-10-CM | POA: Diagnosis not present

## 2018-01-07 NOTE — Telephone Encounter (Signed)
Patient has appointment with Dr. Laurance Flatten on 01/08/18 where this will be discussed at that time.

## 2018-01-07 NOTE — Telephone Encounter (Signed)
TC from [place] w/ [facility] Pt's BPs were 80/54 & 76/48 yesterday, went to [facility] got fluids and came home. Today BP is 105/66 he is still on Lisinopril 10 mg, Metoprolol 25 mg and Lasix. Looking at the summary from [facility] it looks like the D/Cd the Lasix.  Appt has been made with Dr. Laurance Flatten for 01/08/18 @ 4pm to hold meds today and tomorrow and request to have granddaughter to come to appointment with patient.

## 2018-01-08 ENCOUNTER — Encounter: Payer: Self-pay | Admitting: Family Medicine

## 2018-01-08 ENCOUNTER — Other Ambulatory Visit: Payer: Self-pay

## 2018-01-08 ENCOUNTER — Ambulatory Visit (INDEPENDENT_AMBULATORY_CARE_PROVIDER_SITE_OTHER): Payer: Medicare HMO | Admitting: Family Medicine

## 2018-01-08 VITALS — BP 116/56 | HR 96 | Temp 97.9°F | Ht 69.0 in | Wt 180.0 lb

## 2018-01-08 DIAGNOSIS — I10 Essential (primary) hypertension: Secondary | ICD-10-CM | POA: Diagnosis not present

## 2018-01-08 DIAGNOSIS — E1169 Type 2 diabetes mellitus with other specified complication: Secondary | ICD-10-CM | POA: Diagnosis not present

## 2018-01-08 DIAGNOSIS — D508 Other iron deficiency anemias: Secondary | ICD-10-CM

## 2018-01-08 DIAGNOSIS — E785 Hyperlipidemia, unspecified: Secondary | ICD-10-CM

## 2018-01-08 DIAGNOSIS — Z23 Encounter for immunization: Secondary | ICD-10-CM

## 2018-01-08 MED ORDER — LISINOPRIL 5 MG PO TABS: 5.0000 mg | ORAL_TABLET | Freq: Every day | ORAL | 3 refills | Status: DC

## 2018-01-08 NOTE — Patient Instructions (Signed)
No driving for 2 weeks We will have the pharmacy reduce your lisinopril to 5 mg daily instead of 10 mg daily Check blood pressures and pulse rates at home Work with physical therapy Drink plenty of water and fluids

## 2018-01-08 NOTE — Patient Outreach (Signed)
[facility]) Care Management  01/08/2018  MOUHAMADOU GITTLEMAN 1932/09/16 [id number]   Initial outreach to Mr. Cammarata regarding social work referral for "assistance with Regulatory affairs officer and community resources for sitter and socialization." BSW called home number and aide answered the phone reporting that Mr. Klecker was sleeping and has appointments in the afternoon.   BSW attempted to contact his daughter, [name], but it went to voicemail and the mailbox was not set up. BSW will attempt to contact again within four business days.  [name], BSW Social Worker [phone]

## 2018-01-08 NOTE — Progress Notes (Signed)
Subjective:    Patient ID: [name], male    DOB: 12-24-1932, 84 y.o.   MRN: [id number]  HPI Pt here today for hospital follow up. He was at [facility] on 01/06/18 for hypotension.  Patient's granddaughter comes with him to the visit today.  He has daytime care and has an advanced nurse coming in 3 times weekly and physical therapy coming in 3 times weekly.  Because of his ongoing problems with low blood pressure and falling we will reduce the lisinopril from 10 mg to 5 mg daily.  We will plan to recheck his blood pressure again in about 2 weeks.  The family knows to call us between now and then if there is any problems.  The patient today denies any chest pain or shortness of breath.  He denies any trouble with his intestinal system and is currently taking iron after having the colonoscopy and endoscopy done.  He is passing his water well but was incontinent last night.   Patient Active Problem List   Diagnosis Date Noted  . Depression 12/26/2017  . Sepsis ([place]) 12/26/2017  . Syncope   . Syncope and collapse   . Fall   . Symptomatic anemia 12/25/2017  . Dyslipidemia 10/15/2017  . Leg swelling 10/15/2017  . Palpitations 08/21/2016  . Essential hypertension 08/21/2016  . Carotid bruit 08/21/2016  . H/O aortic valve replacement 08/21/2016  . Anemia, iron deficiency 04/11/2014  . Orthostatic hypotension 06/22/2012  . Dizzy 06/22/2012  . Precordial pain 09/18/2011  . Unstable angina ([place]) 07/24/2011  . CAD (coronary artery disease) 01/30/2011  . COLONIC POLYPS 06/23/2008  . Type 2 diabetes mellitus with hyperlipidemia ([place]) 06/23/2008  . Hyperlipidemia LDL goal <70 06/23/2008  . Aortic valve disorder 06/23/2008  . BENIGN PROSTATIC HYPERTROPHY, HX OF 06/23/2008   Outpatient Encounter Medications as of 01/08/2018  Medication Sig  . ACCU-CHEK SOFTCLIX LANCETS lancets Use as instructed  . aspirin 81 MG tablet Take 1 tablet (81 mg total) by mouth every morning.  . escitalopram  (LEXAPRO) 20 MG tablet Take 0.5 tablets (10 mg total) by mouth every evening.  . ferrous sulfate 325 (65 FE) MG tablet Take 1 tablet (325 mg total) by mouth daily with breakfast for 14 days.  Marland Kitchen glucose blood (ACCU-CHEK AVIVA) test strip Test BS QD and PRN  . lisinopril (PRINIVIL,ZESTRIL) 10 MG tablet Take 1 tablet (10 mg total) by mouth every morning.  . metFORMIN (GLUCOPHAGE) 500 MG tablet Take 1 tablet (500 mg total) by mouth 2 (two) times daily.  . metoprolol succinate (TOPROL-XL) 25 MG 24 hr tablet Take 1 tablet (25 mg total) by mouth every morning.  . nitroGLYCERIN (NITROSTAT) 0.4 MG SL tablet Place 1 tablet (0.4 mg total) under the tongue every 5 (five) minutes x 3 doses as needed for chest pain.  . pantoprazole (PROTONIX) 40 MG tablet Take 1 tablet (40 mg total) by mouth 2 (two) times daily before a meal.  . rosuvastatin (CRESTOR) 40 MG tablet Take 1 tablet (40 mg total) by mouth every evening.   No facility-administered encounter medications on file as of 01/08/2018.       Review of Systems  Constitutional: Negative.   HENT: Negative.   Eyes: Negative.   Respiratory: Negative.   Cardiovascular: Negative.        Hypotension   Gastrointestinal: Negative.   Endocrine: Negative.   Genitourinary: Negative.   Musculoskeletal: Negative.   Skin: Negative.   Allergic/Immunologic: Negative.   Neurological: Negative.  Hematological: Negative.   Psychiatric/Behavioral: Negative.        Depression        Objective:   Physical Exam  Constitutional: He is oriented to person, place, and time. No distress.  Comes with his granddaughter to the visit today.  His color remains somewhat pale.  HENT:  Head: Normocephalic and atraumatic.  Eyes: Pupils are equal, round, and reactive to light. Conjunctivae and EOM are normal. Right eye exhibits no discharge. Left eye exhibits no discharge. No scleral icterus.  Neck: Normal range of motion. Neck supple. No thyromegaly present.    Cardiovascular: Normal rate and normal heart sounds.  No murmur heard. Heart is slightly irregular at 72/min  Pulmonary/Chest: Effort normal and breath sounds normal. No respiratory distress. He has no wheezes. He has no rales.  Clear anteriorly and posteriorly  Abdominal: Soft. Bowel sounds are normal. He exhibits no mass. There is no tenderness.  Musculoskeletal: Normal range of motion. He exhibits no edema.  Lymphadenopathy:    He has no cervical adenopathy.  Neurological: He is alert and oriented to person, place, and time. He has normal reflexes.  Skin: Skin is warm and dry. No rash noted. There is pallor.  Psychiatric: He has a normal mood and affect. His behavior is normal. Judgment and thought content normal.  Mood affect and behavior are normal for this patient may be somewhat flat.  Nursing note and vitals reviewed.  BP 108/64 (BP Location: Right Arm)   Pulse 96   Temp 97.9 F (36.6 C) (Oral)   Ht 5\' 9"  (1.753 m)   Wt 180 lb (81.6 kg)   BMI 26.58 kg/m         Assessment & Plan:  1. Encounter for immunization - Flu vaccine HIGH DOSE PF  2. Essential hypertension -Blood pressure was good today but seems to be running a little bit lower and we will therefore reduce his lisinopril from 10 mg to 5 mg.  3. Other iron deficiency anemia -Continue with iron  4. Type 2 diabetes mellitus with hyperlipidemia ([place]) -Continue with diabetic treatment  Meds ordered this encounter  Medications  . lisinopril (PRINIVIL,ZESTRIL) 5 MG tablet    Sig: Take 1 tablet (5 mg total) by mouth daily.    Dispense:  90 tablet    Refill:  3    pre-pack   Patient Instructions  No driving for 2 weeks We will have the pharmacy reduce your lisinopril to 5 mg daily instead of 10 mg daily Check blood pressures and pulse rates at home Work with physical therapy Drink plenty of water and fluids  Arrie Senate MD

## 2018-01-09 ENCOUNTER — Telehealth: Payer: Self-pay | Admitting: *Deleted

## 2018-01-09 DIAGNOSIS — R55 Syncope and collapse: Secondary | ICD-10-CM | POA: Diagnosis not present

## 2018-01-09 DIAGNOSIS — K449 Diaphragmatic hernia without obstruction or gangrene: Secondary | ICD-10-CM | POA: Diagnosis not present

## 2018-01-09 DIAGNOSIS — I110 Hypertensive heart disease with heart failure: Secondary | ICD-10-CM | POA: Diagnosis not present

## 2018-01-09 DIAGNOSIS — K2990 Gastroduodenitis, unspecified, without bleeding: Secondary | ICD-10-CM | POA: Diagnosis not present

## 2018-01-09 DIAGNOSIS — I5032 Chronic diastolic (congestive) heart failure: Secondary | ICD-10-CM | POA: Diagnosis not present

## 2018-01-09 DIAGNOSIS — D649 Anemia, unspecified: Secondary | ICD-10-CM | POA: Diagnosis not present

## 2018-01-09 DIAGNOSIS — I2510 Atherosclerotic heart disease of native coronary artery without angina pectoris: Secondary | ICD-10-CM | POA: Diagnosis not present

## 2018-01-09 DIAGNOSIS — E785 Hyperlipidemia, unspecified: Secondary | ICD-10-CM | POA: Diagnosis not present

## 2018-01-09 DIAGNOSIS — E1169 Type 2 diabetes mellitus with other specified complication: Secondary | ICD-10-CM | POA: Diagnosis not present

## 2018-01-09 MED ORDER — BLOOD GLUCOSE METER KIT: PACK | 1 refills | Status: DC

## 2018-01-09 NOTE — Telephone Encounter (Signed)
I spoke with [name], case mgr with [facility] - she states that caregiver called her and is still concerned about low BP. Today was his 1st dose of 5 mg instead of 10 mg lisinopril.  bp today was: 115/68 89/53 90/61.  After discussing we decided to have a PRN nurse come to his house tomorrow to check him out again and ck BP.  We will touch base on MON and see how the weekend goes on the lower dose.   Aware that we will discuss with DWM then about any potential changes

## 2018-01-09 NOTE — Addendum Note (Signed)
Addended by: Zannie Cove on: 01/09/2018 08:49 AM   Modules accepted: Orders

## 2018-01-10 DIAGNOSIS — E785 Hyperlipidemia, unspecified: Secondary | ICD-10-CM | POA: Diagnosis not present

## 2018-01-10 DIAGNOSIS — I5032 Chronic diastolic (congestive) heart failure: Secondary | ICD-10-CM | POA: Diagnosis not present

## 2018-01-10 DIAGNOSIS — I2510 Atherosclerotic heart disease of native coronary artery without angina pectoris: Secondary | ICD-10-CM | POA: Diagnosis not present

## 2018-01-10 DIAGNOSIS — E1169 Type 2 diabetes mellitus with other specified complication: Secondary | ICD-10-CM | POA: Diagnosis not present

## 2018-01-10 DIAGNOSIS — I110 Hypertensive heart disease with heart failure: Secondary | ICD-10-CM | POA: Diagnosis not present

## 2018-01-10 DIAGNOSIS — K449 Diaphragmatic hernia without obstruction or gangrene: Secondary | ICD-10-CM | POA: Diagnosis not present

## 2018-01-10 DIAGNOSIS — D649 Anemia, unspecified: Secondary | ICD-10-CM | POA: Diagnosis not present

## 2018-01-10 DIAGNOSIS — K2990 Gastroduodenitis, unspecified, without bleeding: Secondary | ICD-10-CM | POA: Diagnosis not present

## 2018-01-10 DIAGNOSIS — R55 Syncope and collapse: Secondary | ICD-10-CM | POA: Diagnosis not present

## 2018-01-12 DIAGNOSIS — R55 Syncope and collapse: Secondary | ICD-10-CM | POA: Diagnosis not present

## 2018-01-12 DIAGNOSIS — E1169 Type 2 diabetes mellitus with other specified complication: Secondary | ICD-10-CM | POA: Diagnosis not present

## 2018-01-12 DIAGNOSIS — D649 Anemia, unspecified: Secondary | ICD-10-CM | POA: Diagnosis not present

## 2018-01-12 DIAGNOSIS — K449 Diaphragmatic hernia without obstruction or gangrene: Secondary | ICD-10-CM | POA: Diagnosis not present

## 2018-01-12 DIAGNOSIS — I5032 Chronic diastolic (congestive) heart failure: Secondary | ICD-10-CM | POA: Diagnosis not present

## 2018-01-12 DIAGNOSIS — I110 Hypertensive heart disease with heart failure: Secondary | ICD-10-CM | POA: Diagnosis not present

## 2018-01-12 DIAGNOSIS — K2990 Gastroduodenitis, unspecified, without bleeding: Secondary | ICD-10-CM | POA: Diagnosis not present

## 2018-01-12 DIAGNOSIS — I2510 Atherosclerotic heart disease of native coronary artery without angina pectoris: Secondary | ICD-10-CM | POA: Diagnosis not present

## 2018-01-12 DIAGNOSIS — E785 Hyperlipidemia, unspecified: Secondary | ICD-10-CM | POA: Diagnosis not present

## 2018-01-13 ENCOUNTER — Ambulatory Visit: Payer: Self-pay

## 2018-01-13 ENCOUNTER — Telehealth: Payer: Self-pay

## 2018-01-13 ENCOUNTER — Ambulatory Visit: Payer: Medicare HMO | Admitting: Family Medicine

## 2018-01-13 ENCOUNTER — Other Ambulatory Visit: Payer: Self-pay

## 2018-01-13 DIAGNOSIS — E785 Hyperlipidemia, unspecified: Secondary | ICD-10-CM | POA: Diagnosis not present

## 2018-01-13 DIAGNOSIS — I110 Hypertensive heart disease with heart failure: Secondary | ICD-10-CM | POA: Diagnosis not present

## 2018-01-13 DIAGNOSIS — K2990 Gastroduodenitis, unspecified, without bleeding: Secondary | ICD-10-CM | POA: Diagnosis not present

## 2018-01-13 DIAGNOSIS — E1169 Type 2 diabetes mellitus with other specified complication: Secondary | ICD-10-CM | POA: Diagnosis not present

## 2018-01-13 DIAGNOSIS — R55 Syncope and collapse: Secondary | ICD-10-CM | POA: Diagnosis not present

## 2018-01-13 DIAGNOSIS — K449 Diaphragmatic hernia without obstruction or gangrene: Secondary | ICD-10-CM | POA: Diagnosis not present

## 2018-01-13 DIAGNOSIS — I2510 Atherosclerotic heart disease of native coronary artery without angina pectoris: Secondary | ICD-10-CM | POA: Diagnosis not present

## 2018-01-13 DIAGNOSIS — I5032 Chronic diastolic (congestive) heart failure: Secondary | ICD-10-CM | POA: Diagnosis not present

## 2018-01-13 DIAGNOSIS — D649 Anemia, unspecified: Secondary | ICD-10-CM | POA: Diagnosis not present

## 2018-01-13 NOTE — Patient Outreach (Signed)
[name]) Care Management  01/13/2018  [name] 03-23-1933 [id number]   BSW received referral on 01/02/18 for assistance with [facility]ives and "resources for sitter."  Per note from [name], [name], on 01/05/18 Mr. Street "denies the need of services from [facility] Community/Telephonic RNCM, [place], NP, or SW at this time."  However, per note on 01/01/18 his daughter was requesting resources for sitters during times that he does not already have one.  BSW attempted to contact [name] and his daughter on 01/08/18 (see note) but was unsuccessful.   BSW attempted to contact his daughter again today to clarify if sitter services are needed.  BSW was unable to leave voicemail message. Unsuccessful outreach letter mailed.  BSW will attempt to reach again within four business days.  [name], BSW Social Worker [phone]

## 2018-01-13 NOTE — Telephone Encounter (Signed)
Discontinue lisinopril altogether and if blood pressures continue to run low consider reducing Lopressor to 12.5 twice daily

## 2018-01-13 NOTE — Telephone Encounter (Signed)
We also extended visits for Home health to come for a full 60 days. She states that he seems a little more agitated as well - and they will continue to monitor this.

## 2018-01-13 NOTE — Telephone Encounter (Signed)
DWM Decreased BP meds  Friday BP low and he felt dizzy  Family has not given his BP med since Friday  Still low today  112/60   Advise?

## 2018-01-13 NOTE — Telephone Encounter (Signed)
Spoke with Estill Bamberg = aware of D/C Lisinopril

## 2018-01-14 ENCOUNTER — Ambulatory Visit (INDEPENDENT_AMBULATORY_CARE_PROVIDER_SITE_OTHER): Payer: Medicare HMO

## 2018-01-14 DIAGNOSIS — K2990 Gastroduodenitis, unspecified, without bleeding: Secondary | ICD-10-CM | POA: Diagnosis not present

## 2018-01-14 DIAGNOSIS — Z9181 History of falling: Secondary | ICD-10-CM

## 2018-01-14 DIAGNOSIS — F329 Major depressive disorder, single episode, unspecified: Secondary | ICD-10-CM

## 2018-01-14 DIAGNOSIS — E1169 Type 2 diabetes mellitus with other specified complication: Secondary | ICD-10-CM

## 2018-01-14 DIAGNOSIS — D649 Anemia, unspecified: Secondary | ICD-10-CM | POA: Diagnosis not present

## 2018-01-14 DIAGNOSIS — I2510 Atherosclerotic heart disease of native coronary artery without angina pectoris: Secondary | ICD-10-CM

## 2018-01-14 DIAGNOSIS — K449 Diaphragmatic hernia without obstruction or gangrene: Secondary | ICD-10-CM | POA: Diagnosis not present

## 2018-01-14 DIAGNOSIS — Z951 Presence of aortocoronary bypass graft: Secondary | ICD-10-CM

## 2018-01-14 DIAGNOSIS — R55 Syncope and collapse: Secondary | ICD-10-CM

## 2018-01-14 DIAGNOSIS — I110 Hypertensive heart disease with heart failure: Secondary | ICD-10-CM

## 2018-01-14 DIAGNOSIS — I5032 Chronic diastolic (congestive) heart failure: Secondary | ICD-10-CM | POA: Diagnosis not present

## 2018-01-14 DIAGNOSIS — E785 Hyperlipidemia, unspecified: Secondary | ICD-10-CM

## 2018-01-14 DIAGNOSIS — K5730 Diverticulosis of large intestine without perforation or abscess without bleeding: Secondary | ICD-10-CM

## 2018-01-15 DIAGNOSIS — T1490XA Injury, unspecified, initial encounter: Secondary | ICD-10-CM | POA: Diagnosis not present

## 2018-01-15 DIAGNOSIS — R5381 Other malaise: Secondary | ICD-10-CM | POA: Diagnosis not present

## 2018-01-16 ENCOUNTER — Ambulatory Visit: Payer: Self-pay

## 2018-01-16 ENCOUNTER — Other Ambulatory Visit: Payer: Self-pay | Admitting: *Deleted

## 2018-01-16 ENCOUNTER — Other Ambulatory Visit: Payer: Self-pay

## 2018-01-16 DIAGNOSIS — E1169 Type 2 diabetes mellitus with other specified complication: Secondary | ICD-10-CM | POA: Diagnosis not present

## 2018-01-16 DIAGNOSIS — K2990 Gastroduodenitis, unspecified, without bleeding: Secondary | ICD-10-CM | POA: Diagnosis not present

## 2018-01-16 DIAGNOSIS — I5032 Chronic diastolic (congestive) heart failure: Secondary | ICD-10-CM | POA: Diagnosis not present

## 2018-01-16 DIAGNOSIS — I2510 Atherosclerotic heart disease of native coronary artery without angina pectoris: Secondary | ICD-10-CM | POA: Diagnosis not present

## 2018-01-16 DIAGNOSIS — I110 Hypertensive heart disease with heart failure: Secondary | ICD-10-CM | POA: Diagnosis not present

## 2018-01-16 DIAGNOSIS — E785 Hyperlipidemia, unspecified: Secondary | ICD-10-CM | POA: Diagnosis not present

## 2018-01-16 DIAGNOSIS — D649 Anemia, unspecified: Secondary | ICD-10-CM | POA: Diagnosis not present

## 2018-01-16 DIAGNOSIS — R55 Syncope and collapse: Secondary | ICD-10-CM | POA: Diagnosis not present

## 2018-01-16 DIAGNOSIS — K449 Diaphragmatic hernia without obstruction or gangrene: Secondary | ICD-10-CM | POA: Diagnosis not present

## 2018-01-16 MED ORDER — PANTOPRAZOLE SODIUM 40 MG PO TBEC: 40.0000 mg | DELAYED_RELEASE_TABLET | Freq: Two times a day (BID) | ORAL | 0 refills | Status: DC

## 2018-01-16 NOTE — Patient Outreach (Signed)
[facility]) Care Management  01/16/2018  BERLIE HATCHEL 12-27-1932 [id number]   Third outreach attempt regarding social work referral.  Unable to contact patient's daughter at mobile number and voicemail box was not set up. Left message at patient's home number. BSW will close case if no return call by 01/21/18.  [name], BSW Social Worker [phone]

## 2018-01-20 DIAGNOSIS — I2510 Atherosclerotic heart disease of native coronary artery without angina pectoris: Secondary | ICD-10-CM | POA: Diagnosis not present

## 2018-01-20 DIAGNOSIS — D649 Anemia, unspecified: Secondary | ICD-10-CM | POA: Diagnosis not present

## 2018-01-20 DIAGNOSIS — K449 Diaphragmatic hernia without obstruction or gangrene: Secondary | ICD-10-CM | POA: Diagnosis not present

## 2018-01-20 DIAGNOSIS — K2990 Gastroduodenitis, unspecified, without bleeding: Secondary | ICD-10-CM | POA: Diagnosis not present

## 2018-01-20 DIAGNOSIS — R55 Syncope and collapse: Secondary | ICD-10-CM | POA: Diagnosis not present

## 2018-01-20 DIAGNOSIS — I110 Hypertensive heart disease with heart failure: Secondary | ICD-10-CM | POA: Diagnosis not present

## 2018-01-20 DIAGNOSIS — I5032 Chronic diastolic (congestive) heart failure: Secondary | ICD-10-CM | POA: Diagnosis not present

## 2018-01-20 DIAGNOSIS — E785 Hyperlipidemia, unspecified: Secondary | ICD-10-CM | POA: Diagnosis not present

## 2018-01-20 DIAGNOSIS — E1169 Type 2 diabetes mellitus with other specified complication: Secondary | ICD-10-CM | POA: Diagnosis not present

## 2018-01-21 ENCOUNTER — Other Ambulatory Visit: Payer: Self-pay

## 2018-01-21 ENCOUNTER — Ambulatory Visit (INDEPENDENT_AMBULATORY_CARE_PROVIDER_SITE_OTHER): Payer: Medicare HMO | Admitting: Family Medicine

## 2018-01-21 ENCOUNTER — Encounter: Payer: Self-pay | Admitting: Family Medicine

## 2018-01-21 VITALS — BP 90/58 | HR 89 | Temp 97.1°F | Ht 69.0 in | Wt 172.0 lb

## 2018-01-21 DIAGNOSIS — D508 Other iron deficiency anemias: Secondary | ICD-10-CM | POA: Diagnosis not present

## 2018-01-21 DIAGNOSIS — E785 Hyperlipidemia, unspecified: Secondary | ICD-10-CM

## 2018-01-21 DIAGNOSIS — E7800 Pure hypercholesterolemia, unspecified: Secondary | ICD-10-CM

## 2018-01-21 DIAGNOSIS — E559 Vitamin D deficiency, unspecified: Secondary | ICD-10-CM

## 2018-01-21 DIAGNOSIS — E1169 Type 2 diabetes mellitus with other specified complication: Secondary | ICD-10-CM

## 2018-01-21 DIAGNOSIS — I951 Orthostatic hypotension: Secondary | ICD-10-CM | POA: Diagnosis not present

## 2018-01-21 DIAGNOSIS — E0843 Diabetes mellitus due to underlying condition with diabetic autonomic (poly)neuropathy: Secondary | ICD-10-CM

## 2018-01-21 DIAGNOSIS — I10 Essential (primary) hypertension: Secondary | ICD-10-CM

## 2018-01-21 LAB — BAYER DCA HB A1C WAIVED: HB A1C: 6.5 % (ref <7.0)

## 2018-01-21 MED ORDER — METOPROLOL SUCCINATE ER 25 MG PO TB24: 12.5000 mg | ORAL_TABLET | Freq: Every day | ORAL | 3 refills | Status: DC

## 2018-01-21 NOTE — Patient Outreach (Signed)
[facility]) Care Management  01/21/2018  ORVELL CAREAGA 11-28-1932 [id number]   [facility] BSW case closure due to multiple unsuccessful outreach attempts to patient and daughter.    [name], BSW Social Worker [phone]

## 2018-01-21 NOTE — Progress Notes (Signed)
Subjective:    Patient ID: [name], male    DOB: 09/10/1932, 84 y.o.   MRN: [id number]  HPI Pt here for follow up and management of chronic medical problems which includes diabetes, hyperlipidemia and hypertension. He is taking medication regularly.  Patient's blood pressure is still running low and was 79/49 today and he is off the lisinopril and only taking metoprolol 25 once daily.  He comes to the visit today with his daughter.  The pulse rate was 89 today but the blood pressure by the nurse on the initial visit was 79/49.  The depression screen was improved from 19 to 10.  He is now on Lexapro.  He is still taking his iron and is taking Protonix for his stomach.  The patient today appears to be calm.  He denies any chest pain pressure tightness or shortness of breath.  He denies any trouble with his stomach other than constipation and had to take a Dulcolax tablet recently.  He denies any nausea vomiting diarrhea and a course his stools are dark color because of his iron intake.  He is passing his water without problems.  He has not been driving.  Blood sugars at home of been running in the 120s fasting.  Weight today is 172.    Patient Active Problem List   Diagnosis Date Noted  . Depression 12/26/2017  . Sepsis ([place]) 12/26/2017  . Syncope   . Syncope and collapse   . Fall   . Symptomatic anemia 12/25/2017  . Dyslipidemia 10/15/2017  . Leg swelling 10/15/2017  . Palpitations 08/21/2016  . Essential hypertension 08/21/2016  . Carotid bruit 08/21/2016  . H/O aortic valve replacement 08/21/2016  . Anemia, iron deficiency 04/11/2014  . Orthostatic hypotension 06/22/2012  . Dizzy 06/22/2012  . Precordial pain 09/18/2011  . Unstable angina ([place]) 07/24/2011  . CAD (coronary artery disease) 01/30/2011  . COLONIC POLYPS 06/23/2008  . Type 2 diabetes mellitus with hyperlipidemia ([name]) 06/23/2008  . Hyperlipidemia LDL goal <70 06/23/2008  . Aortic valve disorder 06/23/2008  .  BENIGN PROSTATIC HYPERTROPHY, HX OF 06/23/2008   Outpatient Encounter Medications as of 01/21/2018  Medication Sig  . ACCU-CHEK SOFTCLIX LANCETS lancets Use as instructed  . aspirin 81 MG tablet Take 1 tablet (81 mg total) by mouth every morning.  . blood glucose meter kit and supplies Check BS up to four times daily.Dx E11.9  . escitalopram (LEXAPRO) 20 MG tablet Take 0.5 tablets (10 mg total) by mouth every evening.  Marland Kitchen glucose blood (ACCU-CHEK AVIVA) test strip Test BS QD and PRN  . metFORMIN (GLUCOPHAGE) 500 MG tablet Take 1 tablet (500 mg total) by mouth 2 (two) times daily.  . metoprolol succinate (TOPROL-XL) 25 MG 24 hr tablet Take 1 tablet (25 mg total) by mouth every morning.  . nitroGLYCERIN (NITROSTAT) 0.4 MG SL tablet Place 1 tablet (0.4 mg total) under the tongue every 5 (five) minutes x 3 doses as needed for chest pain.  . pantoprazole (PROTONIX) 40 MG tablet Take 1 tablet (40 mg total) by mouth 2 (two) times daily before a meal.  . rosuvastatin (CRESTOR) 40 MG tablet Take 1 tablet (40 mg total) by mouth every evening.  . ferrous sulfate 325 (65 FE) MG tablet Take 1 tablet (325 mg total) by mouth daily with breakfast for 14 days.  . [DISCONTINUED] lisinopril (PRINIVIL,ZESTRIL) 5 MG tablet Take 1 tablet (5 mg total) by mouth daily.   No facility-administered encounter medications on file as of  01/21/2018.       Review of Systems  Constitutional: Negative.   HENT: Negative.   Eyes: Negative.   Respiratory: Negative.   Cardiovascular: Negative.        LOW BP  Gastrointestinal: Positive for constipation.  Endocrine: Negative.   Genitourinary: Negative.   Musculoskeletal: Negative.   Skin: Negative.   Allergic/Immunologic: Negative.   Neurological: Negative.   Hematological: Negative.   Psychiatric/Behavioral: Negative.        Objective:   Physical Exam  Constitutional: He is oriented to person, place, and time. He appears well-developed and well-nourished.  The  patient appears to be stable but was somewhat quiet in his responses to questions asked of him today but both his daughter and himself agreed that he is feeling better.  He has not had any more falls.  HENT:  Head: Normocephalic and atraumatic.  Right Ear: External ear normal.  Left Ear: External ear normal.  Nose: Nose normal.  Mouth/Throat: Oropharynx is clear and moist. No oropharyngeal exudate.  Eyes: Pupils are equal, round, and reactive to light. Conjunctivae and EOM are normal. Right eye exhibits no discharge. Left eye exhibits no discharge. No scleral icterus.  Neck: Normal range of motion. Neck supple. No thyromegaly present.  No bruits thyromegaly or anterior cervical adenopathy  Cardiovascular: Normal rate, regular rhythm, normal heart sounds and intact distal pulses.  No murmur heard. Heart is regular at 72/min.  Pulses were present bilaterally but slightly decreased.  Pulmonary/Chest: Effort normal and breath sounds normal. He has no wheezes. He has no rales.  Clear anteriorly and posteriorly  Abdominal: Soft. Bowel sounds are normal. He exhibits no mass. There is no tenderness. There is no rebound and no guarding.  No liver or spleen enlargement epigastric or abdominal tenderness masses   Musculoskeletal: Normal range of motion. He exhibits no edema or tenderness.  Lymphadenopathy:    He has no cervical adenopathy.  Neurological: He is alert and oriented to person, place, and time. He has normal reflexes. No cranial nerve deficit.  Skin: Skin is warm and dry. No rash noted.  Psychiatric: His behavior is normal. Judgment and thought content normal.  The patient's demeanor was somewhat flat today but he and his daughter state he is doing better with this.  Somewhat slow to respond to questions asked of him.  Nursing note and vitals reviewed.  BP (!) 79/49 (BP Location: Left Arm)   Pulse 89   Temp (!) 97.1 F (36.2 C) (Oral)   Ht '5\' 9"'  (1.753 m)   Wt 172 lb (78 kg)   BMI  25.40 kg/m   Repeat blood pressure with large cuff was 90/58 in the right arm sitting      Assessment & Plan:  1. Type 2 diabetes mellitus with hyperlipidemia (HCC) -Blood sugars have been running in the 120s before eating breakfast in the morning. - BMP8+EGFR - CBC with Differential/Platelet - Bayer DCA Hb A1c Waived  2. Essential hypertension -The blood pressure still running somewhat low for this patient and with his history of syncopal spells we will further reduce the metoprolol extended release to 12-1/2 mg once daily.  We will encourage ongoing monitoring of pulse rates and blood pressure at home both sitting and standing. - BMP8+EGFR - CBC with Differential/Platelet  3. Other iron deficiency anemia -Continue with iron pending results of lab work -Because of constipation the daughter was encouraged to have the patient take some MiraLAX once daily or every other day to help keep  bowels moving more regularly. - CBC with Differential/Platelet  4. Vitamin D deficiency -Continue with vitamin D replacement - CBC with Differential/Platelet  5. Pure hypercholesterolemia -With history of heart disease patient will continue with statin therapy - CBC with Differential/Platelet  6. Postural hypotension -Continue to monitor blood pressures at home by home health and patient's family  7. Diabetes mellitus due to underlying condition with diabetic autonomic neuropathy, without long-term current use of insulin (HCC) -Check feet regularly  No orders of the defined types were placed in this encounter.  We did check with the pharmacist and he can reduce his metoprolol extended release to one half daily or 12.5 mg daily.  Patient Instructions                       Medicare Annual Wellness Visit  [facility] and the medical providers at [facility] strive to bring you the best medical care.  In doing so we not only want to address your current medical conditions  and concerns but also to detect new conditions early and prevent illness, disease and health-related problems.    Medicare offers a yearly Wellness Visit which allows our clinical staff to assess your need for preventative services including immunizations, lifestyle education, counseling to decrease risk of preventable diseases and screening for fall risk and other medical concerns.    This visit is provided free of charge (no copay) for all Medicare recipients. The clinical pharmacists at [facility] have begun to conduct these Wellness Visits which will also include a thorough review of all your medications.    As you primary medical provider recommend that you make an appointment for your Annual Wellness Visit if you have not done so already this year.  You may set up this appointment before you leave today or you may call back [phone]) and schedule an appointment.  Please make sure when you call that you mention that you are scheduling your Annual Wellness Visit with the clinical pharmacist so that the appointment may be made for the proper length of time.     Continue current medications. Continue good therapeutic lifestyle changes which include good diet and exercise. Fall precautions discussed with patient. If an FOBT was given today- please return it to our front desk. If you are over 35 years old - you may need Prevnar 18 or the adult Pneumonia vaccine.  **Flu shots are available--- please call and schedule a FLU-CLINIC appointment**  After your visit with [place] today you will receive a survey in the mail or online from [employer] regarding your care with [place]. Please take a moment to fill this out. Your feedback is very important to [place] as you can help [place] better understand your patient needs as well as improve your experience and satisfaction. WE CARE ABOUT YOU!!!  Continue to drink plenty of water and fluids Take the Dulcolax only if needed. Continue or add MiraLAX  1 scoop daily or every other day as needed to keep bowels moving more regularly. Continue to monitor blood pressures and blood sugars at home on a regular basis Continue with Lexapro as this seems to be helping you with your depression. The metoprolol 25 extended release can be cut in half and the patient will take just a half of 1 daily. We would asked that blood pressures and pulse rates be monitored closely and that someone give [place] some feedback with these numbers in the next 2 to  3 weeks. The patient should continue to refrain from driving until more stability has been reached with his falling.   Arrie Senate MD

## 2018-01-21 NOTE — Patient Instructions (Addendum)
Medicare Annual Wellness Visit  [facility] and the medical providers at [facility] strive to bring you the best medical care.  In doing so we not only want to address your current medical conditions and concerns but also to detect new conditions early and prevent illness, disease and health-related problems.    Medicare offers a yearly Wellness Visit which allows our clinical staff to assess your need for preventative services including immunizations, lifestyle education, counseling to decrease risk of preventable diseases and screening for fall risk and other medical concerns.    This visit is provided free of charge (no copay) for all Medicare recipients. The clinical pharmacists at [facility] have begun to conduct these Wellness Visits which will also include a thorough review of all your medications.    As you primary medical provider recommend that you make an appointment for your Annual Wellness Visit if you have not done so already this year.  You may set up this appointment before you leave today or you may call back ([phone]) and schedule an appointment.  Please make sure when you call that you mention that you are scheduling your Annual Wellness Visit with the clinical pharmacist so that the appointment may be made for the proper length of time.     Continue current medications. Continue good therapeutic lifestyle changes which include good diet and exercise. Fall precautions discussed with patient. If an FOBT was given today- please return it to our front desk. If you are over 85 years old - you may need Prevnar 4 or the adult Pneumonia vaccine.  **Flu shots are available--- please call and schedule a FLU-CLINIC appointment**  After your visit with [place] today you will receive a survey in the mail or online from [employer] regarding your care with [place]. Please take a moment to fill this out. Your feedback is very  important to [place] as you can help [place] better understand your patient needs as well as improve your experience and satisfaction. WE CARE ABOUT YOU!!!  Continue to drink plenty of water and fluids Take the Dulcolax only if needed. Continue or add MiraLAX 1 scoop daily or every other day as needed to keep bowels moving more regularly. Continue to monitor blood pressures and blood sugars at home on a regular basis Continue with Lexapro as this seems to be helping you with your depression. The metoprolol 25 extended release can be cut in half and the patient will take just a half of 1 daily. We would asked that blood pressures and pulse rates be monitored closely and that someone give [place] some feedback with these numbers in the next 2 to 3 weeks. The patient should continue to refrain from driving until more stability has been reached with his falling.

## 2018-01-22 DIAGNOSIS — D649 Anemia, unspecified: Secondary | ICD-10-CM | POA: Diagnosis not present

## 2018-01-22 DIAGNOSIS — K449 Diaphragmatic hernia without obstruction or gangrene: Secondary | ICD-10-CM | POA: Diagnosis not present

## 2018-01-22 DIAGNOSIS — E1169 Type 2 diabetes mellitus with other specified complication: Secondary | ICD-10-CM | POA: Diagnosis not present

## 2018-01-22 DIAGNOSIS — R55 Syncope and collapse: Secondary | ICD-10-CM | POA: Diagnosis not present

## 2018-01-22 DIAGNOSIS — I5032 Chronic diastolic (congestive) heart failure: Secondary | ICD-10-CM | POA: Diagnosis not present

## 2018-01-22 DIAGNOSIS — K2990 Gastroduodenitis, unspecified, without bleeding: Secondary | ICD-10-CM | POA: Diagnosis not present

## 2018-01-22 DIAGNOSIS — I110 Hypertensive heart disease with heart failure: Secondary | ICD-10-CM | POA: Diagnosis not present

## 2018-01-22 DIAGNOSIS — I2510 Atherosclerotic heart disease of native coronary artery without angina pectoris: Secondary | ICD-10-CM | POA: Diagnosis not present

## 2018-01-22 DIAGNOSIS — E785 Hyperlipidemia, unspecified: Secondary | ICD-10-CM | POA: Diagnosis not present

## 2018-01-22 LAB — CBC WITH DIFFERENTIAL/PLATELET
Basophils Absolute: 0.1 10*3/uL (ref 0.0–0.2)
Basos: 1 %
EOS (ABSOLUTE): 0.5 10*3/uL — ABNORMAL HIGH (ref 0.0–0.4)
EOS: 6 %
HEMATOCRIT: 28.9 % — AB (ref 37.5–51.0)
HEMOGLOBIN: 8.8 g/dL — AB (ref 13.0–17.7)
IMMATURE GRANULOCYTES: 0 %
Immature Grans (Abs): 0 10*3/uL (ref 0.0–0.1)
LYMPHS: 20 %
Lymphocytes Absolute: 1.7 10*3/uL (ref 0.7–3.1)
MCH: 22.5 pg — ABNORMAL LOW (ref 26.6–33.0)
MCHC: 30.4 g/dL — ABNORMAL LOW (ref 31.5–35.7)
MCV: 74 fL — ABNORMAL LOW (ref 79–97)
MONOCYTES: 10 %
Monocytes Absolute: 0.8 10*3/uL (ref 0.1–0.9)
NEUTROS PCT: 63 %
Neutrophils Absolute: 5.2 10*3/uL (ref 1.4–7.0)
Platelets: 251 10*3/uL (ref 150–450)
RBC: 3.91 x10E6/uL — AB (ref 4.14–5.80)
RDW: 16.1 % — ABNORMAL HIGH (ref 12.3–15.4)
WBC: 8.3 10*3/uL (ref 3.4–10.8)

## 2018-01-22 LAB — BMP8+EGFR
BUN / CREAT RATIO: 13 (ref 10–24)
BUN: 24 mg/dL (ref 8–27)
CO2: 25 mmol/L (ref 20–29)
CREATININE: 1.92 mg/dL — AB (ref 0.76–1.27)
Calcium: 9.1 mg/dL (ref 8.6–10.2)
Chloride: 99 mmol/L (ref 96–106)
GFR calc Af Amer: 36 mL/min/{1.73_m2} — ABNORMAL LOW (ref >59)
GFR calc non Af Amer: 31 mL/min/{1.73_m2} — ABNORMAL LOW (ref >59)
GLUCOSE: 100 mg/dL — AB (ref 65–99)
Potassium: 4.9 mmol/L (ref 3.5–5.2)
Sodium: 137 mmol/L (ref 134–144)

## 2018-01-26 ENCOUNTER — Emergency Department (HOSPITAL_COMMUNITY): Payer: Medicare HMO

## 2018-01-26 ENCOUNTER — Other Ambulatory Visit: Payer: Self-pay

## 2018-01-26 ENCOUNTER — Inpatient Hospital Stay (HOSPITAL_COMMUNITY)
Admission: EM | Admit: 2018-01-26 | Discharge: 2018-01-27 | DRG: 066 | Disposition: A | Discharge status: Home / Self Care | Payer: Medicare HMO | Attending: Nephrology | Admitting: Nephrology

## 2018-01-26 ENCOUNTER — Encounter (HOSPITAL_COMMUNITY): Payer: Self-pay | Admitting: Emergency Medicine

## 2018-01-26 DIAGNOSIS — Z7984 Long term (current) use of oral hypoglycemic drugs: Secondary | ICD-10-CM

## 2018-01-26 DIAGNOSIS — F0390 Unspecified dementia without behavioral disturbance: Secondary | ICD-10-CM | POA: Diagnosis present

## 2018-01-26 DIAGNOSIS — R296 Repeated falls: Secondary | ICD-10-CM

## 2018-01-26 DIAGNOSIS — E1169 Type 2 diabetes mellitus with other specified complication: Secondary | ICD-10-CM | POA: Diagnosis not present

## 2018-01-26 DIAGNOSIS — W19XXXA Unspecified fall, initial encounter: Secondary | ICD-10-CM | POA: Diagnosis not present

## 2018-01-26 DIAGNOSIS — Z7982 Long term (current) use of aspirin: Secondary | ICD-10-CM

## 2018-01-26 DIAGNOSIS — E860 Dehydration: Secondary | ICD-10-CM | POA: Diagnosis present

## 2018-01-26 DIAGNOSIS — I2510 Atherosclerotic heart disease of native coronary artery without angina pectoris: Secondary | ICD-10-CM | POA: Diagnosis present

## 2018-01-26 DIAGNOSIS — Z952 Presence of prosthetic heart valve: Secondary | ICD-10-CM

## 2018-01-26 DIAGNOSIS — I10 Essential (primary) hypertension: Secondary | ICD-10-CM | POA: Diagnosis present

## 2018-01-26 DIAGNOSIS — K2990 Gastroduodenitis, unspecified, without bleeding: Secondary | ICD-10-CM | POA: Diagnosis not present

## 2018-01-26 DIAGNOSIS — D649 Anemia, unspecified: Secondary | ICD-10-CM | POA: Diagnosis not present

## 2018-01-26 DIAGNOSIS — F0280 Dementia in other diseases classified elsewhere without behavioral disturbance: Secondary | ICD-10-CM | POA: Diagnosis not present

## 2018-01-26 DIAGNOSIS — K449 Diaphragmatic hernia without obstruction or gangrene: Secondary | ICD-10-CM | POA: Diagnosis not present

## 2018-01-26 DIAGNOSIS — E785 Hyperlipidemia, unspecified: Secondary | ICD-10-CM | POA: Diagnosis not present

## 2018-01-26 DIAGNOSIS — I639 Cerebral infarction, unspecified: Principal | ICD-10-CM | POA: Diagnosis present

## 2018-01-26 DIAGNOSIS — I110 Hypertensive heart disease with heart failure: Secondary | ICD-10-CM | POA: Diagnosis not present

## 2018-01-26 DIAGNOSIS — I5032 Chronic diastolic (congestive) heart failure: Secondary | ICD-10-CM | POA: Diagnosis not present

## 2018-01-26 DIAGNOSIS — Z79899 Other long term (current) drug therapy: Secondary | ICD-10-CM | POA: Diagnosis not present

## 2018-01-26 DIAGNOSIS — I350 Nonrheumatic aortic (valve) stenosis: Secondary | ICD-10-CM | POA: Diagnosis present

## 2018-01-26 DIAGNOSIS — I5030 Unspecified diastolic (congestive) heart failure: Secondary | ICD-10-CM | POA: Diagnosis not present

## 2018-01-26 DIAGNOSIS — R42 Dizziness and giddiness: Secondary | ICD-10-CM | POA: Diagnosis not present

## 2018-01-26 DIAGNOSIS — N400 Enlarged prostate without lower urinary tract symptoms: Secondary | ICD-10-CM | POA: Diagnosis present

## 2018-01-26 DIAGNOSIS — Z66 Do not resuscitate: Secondary | ICD-10-CM | POA: Diagnosis not present

## 2018-01-26 DIAGNOSIS — R55 Syncope and collapse: Secondary | ICD-10-CM | POA: Diagnosis not present

## 2018-01-26 DIAGNOSIS — G301 Alzheimer's disease with late onset: Secondary | ICD-10-CM | POA: Diagnosis not present

## 2018-01-26 DIAGNOSIS — D509 Iron deficiency anemia, unspecified: Secondary | ICD-10-CM | POA: Diagnosis present

## 2018-01-26 LAB — COMPREHENSIVE METABOLIC PANEL
ALK PHOS: 82 U/L (ref 38–126)
ALT: 20 U/L (ref 0–44)
AST: 33 U/L (ref 15–41)
Albumin: 3.2 g/dL — ABNORMAL LOW (ref 3.5–5.0)
Anion gap: 8 (ref 5–15)
BUN: 16 mg/dL (ref 8–23)
CALCIUM: 8.6 mg/dL — AB (ref 8.9–10.3)
CHLORIDE: 100 mmol/L (ref 98–111)
CO2: 27 mmol/L (ref 22–32)
CREATININE: 1.42 mg/dL — AB (ref 0.61–1.24)
GFR calc non Af Amer: 44 mL/min — ABNORMAL LOW (ref >60)
GFR, EST AFRICAN AMERICAN: 51 mL/min — AB (ref >60)
GLUCOSE: 123 mg/dL — AB (ref 70–99)
Potassium: 4 mmol/L (ref 3.5–5.1)
SODIUM: 135 mmol/L (ref 135–145)
Total Bilirubin: 0.7 mg/dL (ref 0.3–1.2)
Total Protein: 6.6 g/dL (ref 6.5–8.1)

## 2018-01-26 LAB — CBC WITH DIFFERENTIAL/PLATELET
ABS IMMATURE GRANULOCYTES: 0.03 10*3/uL (ref 0.00–0.07)
Basophils Absolute: 0 10*3/uL (ref 0.0–0.1)
Basophils Relative: 0 %
Eosinophils Absolute: 0.2 10*3/uL (ref 0.0–0.5)
Eosinophils Relative: 3 %
HCT: 28.1 % — ABNORMAL LOW (ref 39.0–52.0)
HEMOGLOBIN: 8.1 g/dL — AB (ref 13.0–17.0)
Immature Granulocytes: 0 %
LYMPHS ABS: 1.4 10*3/uL (ref 0.7–4.0)
LYMPHS PCT: 21 %
MCH: 22.1 pg — AB (ref 26.0–34.0)
MCHC: 28.8 g/dL — ABNORMAL LOW (ref 30.0–36.0)
MCV: 76.8 fL — AB (ref 80.0–100.0)
MONO ABS: 0.7 10*3/uL (ref 0.1–1.0)
MONOS PCT: 11 %
NEUTROS ABS: 4.5 10*3/uL (ref 1.7–7.7)
NEUTROS PCT: 65 %
PLATELETS: 205 10*3/uL (ref 150–400)
RBC: 3.66 MIL/uL — ABNORMAL LOW (ref 4.22–5.81)
RDW: 17.4 % — ABNORMAL HIGH (ref 11.5–15.5)
WBC: 6.9 10*3/uL (ref 4.0–10.5)
nRBC: 0 % (ref 0.0–0.2)

## 2018-01-26 LAB — PREPARE RBC (CROSSMATCH)

## 2018-01-26 LAB — TROPONIN I
Troponin I: <0.03 ng/mL (ref <0.03)
Troponin I: <0.03 ng/mL (ref <0.03)

## 2018-01-26 LAB — GLUCOSE, CAPILLARY: GLUCOSE-CAPILLARY: 115 mg/dL — AB (ref 70–99)

## 2018-01-26 LAB — APTT: APTT: 32 s (ref 24–36)

## 2018-01-26 LAB — PROTIME-INR
INR: 1.01
Prothrombin Time: 13.2 seconds (ref 11.4–15.2)

## 2018-01-26 LAB — ETHANOL: Alcohol, Ethyl (B): <10 mg/dL (ref <10)

## 2018-01-26 LAB — LIPASE, BLOOD: Lipase: 51 U/L (ref 11–51)

## 2018-01-26 MED ORDER — ACETAMINOPHEN 325 MG PO TABS: 650.0000 mg | ORAL_TABLET | Freq: Four times a day (QID) | ORAL | Status: DC | PRN

## 2018-01-26 MED ORDER — INSULIN ASPART 100 UNIT/ML ~~LOC~~ SOLN: 0.0000 [IU] | Freq: Three times a day (TID) | SUBCUTANEOUS | Status: DC

## 2018-01-26 MED ORDER — ROSUVASTATIN CALCIUM 20 MG PO TABS
40.0000 mg | ORAL_TABLET | Freq: Every evening | ORAL | Status: DC
  Administered 2018-01-26 – 2018-01-27 (×2): 40 mg via ORAL
  Filled 2018-01-26 (×2): qty 2

## 2018-01-26 MED ORDER — ONDANSETRON HCL 4 MG PO TABS: 4.0000 mg | ORAL_TABLET | Freq: Four times a day (QID) | ORAL | Status: DC | PRN

## 2018-01-26 MED ORDER — ESCITALOPRAM OXALATE 10 MG PO TABS
10.0000 mg | ORAL_TABLET | Freq: Every evening | ORAL | Status: DC
  Administered 2018-01-26 – 2018-01-27 (×2): 10 mg via ORAL
  Filled 2018-01-26 (×2): qty 1

## 2018-01-26 MED ORDER — ENOXAPARIN SODIUM 40 MG/0.4ML ~~LOC~~ SOLN: 40.0000 mg | SUBCUTANEOUS | Status: DC

## 2018-01-26 MED ORDER — MECLIZINE HCL 12.5 MG PO TABS
25.0000 mg | ORAL_TABLET | Freq: Once | ORAL | Status: AC
  Administered 2018-01-26: 25 mg via ORAL
  Filled 2018-01-26: qty 2

## 2018-01-26 MED ORDER — ONDANSETRON HCL 4 MG/2ML IJ SOLN: 4.0000 mg | Freq: Four times a day (QID) | INTRAMUSCULAR | Status: DC | PRN

## 2018-01-26 MED ORDER — ASPIRIN 81 MG PO CHEW
81.0000 mg | CHEWABLE_TABLET | Freq: Every day | ORAL | Status: DC
  Administered 2018-01-27: 81 mg via ORAL
  Filled 2018-01-26: qty 1

## 2018-01-26 MED ORDER — SODIUM CHLORIDE 0.9% IV SOLUTION
Freq: Once | INTRAVENOUS | Status: AC
  Administered 2018-01-26: 23:00:00 via INTRAVENOUS

## 2018-01-26 MED ORDER — POLYETHYLENE GLYCOL 3350 17 G PO PACK: 17.0000 g | PACK | Freq: Every day | ORAL | Status: DC | PRN

## 2018-01-26 MED ORDER — SODIUM CHLORIDE 0.9 % IV SOLN: INTRAVENOUS | Status: DC

## 2018-01-26 MED ORDER — ASPIRIN 325 MG PO TABS
325.0000 mg | ORAL_TABLET | Freq: Once | ORAL | Status: AC
  Administered 2018-01-26: 325 mg via ORAL
  Filled 2018-01-26: qty 1

## 2018-01-26 MED ORDER — ACETAMINOPHEN 650 MG RE SUPP: 650.0000 mg | Freq: Four times a day (QID) | RECTAL | Status: DC | PRN

## 2018-01-26 MED ORDER — METFORMIN HCL 500 MG PO TABS
500.0000 mg | ORAL_TABLET | Freq: Two times a day (BID) | ORAL | Status: DC
  Administered 2018-01-27 (×2): 500 mg via ORAL
  Filled 2018-01-26 (×2): qty 1

## 2018-01-26 MED ORDER — PANTOPRAZOLE SODIUM 40 MG PO TBEC
40.0000 mg | DELAYED_RELEASE_TABLET | Freq: Two times a day (BID) | ORAL | Status: DC
  Administered 2018-01-27 (×2): 40 mg via ORAL
  Filled 2018-01-26 (×2): qty 1

## 2018-01-26 NOTE — ED Provider Notes (Signed)
[facility] EMERGENCY DEPARTMENT Provider Note   CSN: [id number] Arrival date & time: 01/26/18  1442     History   Chief Complaint Chief Complaint  Patient presents with  . Dizziness    HPI [name] is a 84 y.o. male.  HPI  Pt was seen at 1515. Per pt and his Caregiver, c/o sudden onset and resolution of 2 separate episodes of fall that occurred yesterday and again today. Pt states he felt "swimmy headed" before both falls. Pt states he hit his head yesterday and right shoulder today. Has been associated with N/V. LKW 01/25/2018 at 1700. Denies syncope, no LOC, no AMS, no neck or back pain, no abd pain, no diarrhea, no CP/palpitations, no SOB/cough, no fevers, no focal motor weakness, no tingling/numbness in extremities.      Past Medical History:  Diagnosis Date  . Anemia    Iron deficiency  . Aortic stenosis    s/p pericardial AVR  . Aortic valve disorder 06/23/2008   Qualifier: Diagnosis of  By: Mare Ferrari, RMA, Sherri    . Arthritis   . Benign prostatic hypertrophy   . CAD (coronary artery disease) 01/30/2011  . Carotid bruit 08/21/2016  . COLONIC POLYPS 06/23/2008   Qualifier: Diagnosis of  By: Mare Ferrari, RMA, Sherri    . Coronary artery disease    s/p CABG 08/2010    . Diabetes mellitus   . Dyslipidemia 10/15/2017  . Essential hypertension 08/21/2016  . H/O aortic valve replacement 08/21/2016  . Hyperlipidemia   . Hyperlipidemia LDL goal <70 06/23/2008   Qualifier: Diagnosis of  By: Mare Ferrari, RMA, Sherri    . Leg swelling 10/15/2017  . Orthostatic hypotension 06/22/2012  . Palpitations 08/21/2016  . Precordial pain 09/18/2011  . Type 2 diabetes mellitus with hyperlipidemia ([place]) 06/23/2008   Qualifier: Diagnosis of  By: Mare Ferrari, RMA, Sherri    . Unstable angina ([place]) 07/24/2011    Patient Active Problem List   Diagnosis Date Noted  . Diabetes mellitus due to underlying condition with diabetic autonomic neuropathy, without long-term current use of insulin ([place]) 01/21/2018    . Depression 12/26/2017  . Sepsis ([place]) 12/26/2017  . Syncope   . Syncope and collapse   . Fall   . Symptomatic anemia 12/25/2017  . Dyslipidemia 10/15/2017  . Leg swelling 10/15/2017  . Palpitations 08/21/2016  . Essential hypertension 08/21/2016  . Carotid bruit 08/21/2016  . H/O aortic valve replacement 08/21/2016  . Anemia, iron deficiency 04/11/2014  . Orthostatic hypotension 06/22/2012  . Dizzy 06/22/2012  . Precordial pain 09/18/2011  . Unstable angina ([place]) 07/24/2011  . CAD (coronary artery disease) 01/30/2011  . COLONIC POLYPS 06/23/2008  . Type 2 diabetes mellitus with hyperlipidemia ([place]) 06/23/2008  . Hyperlipidemia LDL goal <70 06/23/2008  . Aortic valve disorder 06/23/2008  . BENIGN PROSTATIC HYPERTROPHY, HX OF 06/23/2008    Past Surgical History:  Procedure Laterality Date  . AORTIC VALVE REPLACEMENT  08/24/10   76m pericardial tissue valve  . COLONOSCOPY WITH PROPOFOL N/A 12/28/2017   Procedure: COLONOSCOPY WITH PROPOFOL;  Surgeon: RRogene Houston MD;  Location: [facility];  Service: Endoscopy;  Laterality: N/A;  . CORONARY ARTERY BYPASS GRAFT  08/24/10   LIMA to LAD, SVG to ramus intermediate, SVG to diagonal  . ESOPHAGOGASTRODUODENOSCOPY (EGD) WITH PROPOFOL N/A 12/28/2017   Procedure: ESOPHAGOGASTRODUODENOSCOPY (EGD) WITH PROPOFOL;  Surgeon: RRogene Houston MD;  Location: [facility];  Service: Endoscopy;  Laterality: N/A;  . HERNIA REPAIR    .  knee replacements     x 2  . POLYPECTOMY  12/28/2017   Procedure: POLYPECTOMY;  Surgeon: Rogene Houston, MD;  Location: [facility];  Service: Endoscopy;;  transverse colon         Home Medications    Prior to Admission medications   Medication Sig Start Date End Date Taking? Authorizing Provider  ACCU-CHEK SOFTCLIX LANCETS lancets Use as instructed 09/15/15   Chipper Herb, MD  aspirin 81 MG tablet Take 1 tablet (81 mg total) by mouth every morning. 12/25/17   Chipper Herb, MD  blood glucose  meter kit and supplies Check BS up to four times daily.Dx E11.9 01/09/18   Chipper Herb, MD  escitalopram (LEXAPRO) 20 MG tablet Take 0.5 tablets (10 mg total) by mouth every evening. 12/26/17   Johnson, Clanford L, MD  ferrous sulfate 325 (65 FE) MG tablet Take 1 tablet (325 mg total) by mouth daily with breakfast for 14 days. 12/29/17 01/12/18  Murlean Iba, MD  glucose blood (ACCU-CHEK AVIVA) test strip Test BS QD and PRN 11/23/15   Chipper Herb, MD  metFORMIN (GLUCOPHAGE) 500 MG tablet Take 1 tablet (500 mg total) by mouth 2 (two) times daily. 12/25/17   Chipper Herb, MD  metoprolol succinate (TOPROL-XL) 25 MG 24 hr tablet Take 0.5 tablets (12.5 mg total) by mouth daily before breakfast. 01/21/18   Chipper Herb, MD  nitroGLYCERIN (NITROSTAT) 0.4 MG SL tablet Place 1 tablet (0.4 mg total) under the tongue every 5 (five) minutes x 3 doses as needed for chest pain. 08/21/16   Minus Breeding, MD  pantoprazole (PROTONIX) 40 MG tablet Take 1 tablet (40 mg total) by mouth 2 (two) times daily before a meal. 01/16/18 02/15/18  Chipper Herb, MD  rosuvastatin (CRESTOR) 40 MG tablet Take 1 tablet (40 mg total) by mouth every evening. 12/25/17 03/25/18  Chipper Herb, MD    Family History Family History  Problem Relation Age of Onset  . Cancer Mother   . Diabetes Father   . Hypertension Other   . Diabetes Other     Social History Social History   Tobacco Use  . Smoking status: Former Smoker    Types: Cigars    Last attempt to quit: 09/17/1993    Years since quitting: 24.3  . Smokeless tobacco: Never Used  Substance Use Topics  . Alcohol use: Yes    Comment: occassional  . Drug use: No     Allergies   Patient has no known allergies.   Review of Systems Review of Systems ROS: Statement: All systems negative except as marked or noted in the HPI; Constitutional: Negative for fever and chills. ; ; Eyes: Negative for eye pain, redness and discharge. ; ; ENMT: Negative for  ear pain, hoarseness, nasal congestion, sinus pressure and sore throat. ; ; Cardiovascular: Negative for chest pain, palpitations, diaphoresis, dyspnea and peripheral edema. ; ; Respiratory: Negative for cough, wheezing and stridor. ; ; Gastrointestinal: +N/V. Negative for diarrhea, abdominal pain, blood in stool, hematemesis, jaundice and rectal bleeding. . ; ; Genitourinary: Negative for dysuria, flank pain and hematuria. ; ; Musculoskeletal: Negative for back pain and neck pain. Negative for swelling and deformity. +head injury, right shoulder pain..; ; Skin: Negative for pruritus, rash, abrasions, blisters, bruising and skin lesion.; ; Neuro: +"swimmy headed." Negative for headache, lightheadedness and neck stiffness. Negative for weakness, altered level of consciousness, altered mental status, extremity weakness, paresthesias, involuntary movement, seizure and  syncope.       Physical Exam Updated Vital Signs BP 102/65   Pulse 88   Temp 98.6 F (37 C) (Oral)   Resp 19   Ht '5\' 9"'  (1.753 m)   Wt 78 kg   SpO2 98%   BMI 25.40 kg/m    Patient Vitals for the past 24 hrs:  BP Temp Temp src Pulse Resp SpO2 Height Weight  01/26/18 1530 102/65 - - 88 19 98 % - -  01/26/18 1500 111/65 - - 90 16 98 % - -  01/26/18 1448 - - - - - - '5\' 9"'  (1.753 m) 78 kg  01/26/18 1447 105/69 98.6 F (37 C) Oral 90 18 95 % - -      17:07 Orthostatic Vital Signs FS  Orthostatic Lying   BP- Lying: 116/81   Pulse- Lying: 87       Orthostatic Sitting  BP- Sitting: 103/74   Pulse- Sitting: 90       Orthostatic Standing at 0 minutes  BP- Standing at 0 minutes: 103/65   Pulse- Standing at 0 minutes: 96      Physical Exam 1520: Physical examination:  Nursing notes reviewed; Vital signs and O2 SAT reviewed;  Constitutional: Well developed, Well nourished, Well hydrated, In no acute distress; Head:  Normocephalic, atraumatic; Eyes: EOMI, PERRL, No scleral icterus; ENMT: TM's clear bilat. +edemetous nasal  turbinates bilat with clear rhinorrhea. Mouth and pharynx normal, Mucous membranes moist; Neck: Supple, Full range of motion, No lymphadenopathy; Cardiovascular: Regular rate and rhythm, No gallop; Respiratory: Breath sounds clear & equal bilaterally, No wheezes.  Speaking full sentences with ease, Normal respiratory effort/excursion; Chest: Nontender, Movement normal; Abdomen: Soft, Nontender, Nondistended, Normal bowel sounds; Genitourinary: No CVA tenderness; Extremities: Peripheral pulses normal, NT right clavicle/shoulder/elbow/wrist/hand. No open wounds, no deformity, no rash, no erythema, no ecchymosis. Muscles compartments soft. Strong radial pulses. NMS intact RUE.  No edema, No calf edema or asymmetry.; Neuro: AA&Ox3, Major CN grossly intact. Speech clear.  No facial droop.  No nystagmus. Grips equal. Strength 5/5 equal bilat UE's and LE's.  DTR 2/4 equal bilat UE's and LE's.  No gross sensory deficits.  Normal cerebellar testing bilat UE's (finger-nose) and LE's (heel-shin).; Skin: Color normal, Warm, Dry.    ED Treatments / Results  Labs (all labs ordered are listed, but only abnormal results are displayed)   EKG EKG Interpretation  Date/Time:  Monday January 26 2018 14:47:43 EST Ventricular Rate:  92 PR Interval:    QRS Duration: 118 QT Interval:  380 QTC Calculation: 471 R Axis:   71 Text Interpretation:  Sinus rhythm Nonspecific intraventricular conduction delay , new since last tracing Inferior infarct, age indeterminate Baseline wander in lead(s) I III aVL aVF V5 q waves new since last tracing Confirmed by Dorie Rank [phone]) on 01/26/2018 2:51:52 PM   Radiology   Procedures Procedures (including critical care time)  Medications Ordered in ED Medications  meclizine (ANTIVERT) tablet 25 mg (has no administration in time range)     Initial Impression / Assessment and Plan / ED Course  I have reviewed the triage vital signs and the nursing notes.  Pertinent labs &  imaging results that were available during my care of the patient were reviewed by me and considered in my medical decision making (see chart for details).  MDM Reviewed: previous chart, nursing note and vitals Reviewed previous: labs and ECG Interpretation: labs, ECG, x-ray and MRI   Results for orders placed or performed  during the hospital encounter of 01/26/18  Comprehensive metabolic panel  Result Value Ref Range   Sodium 135 135 - 145 mmol/L   Potassium 4.0 3.5 - 5.1 mmol/L   Chloride 100 98 - 111 mmol/L   CO2 27 22 - 32 mmol/L   Glucose, Bld 123 (H) 70 - 99 mg/dL   BUN 16 8 - 23 mg/dL   Creatinine, Ser 1.42 (H) 0.61 - 1.24 mg/dL   Calcium 8.6 (L) 8.9 - 10.3 mg/dL   Total Protein 6.6 6.5 - 8.1 g/dL   Albumin 3.2 (L) 3.5 - 5.0 g/dL   AST 33 15 - 41 U/L   ALT 20 0 - 44 U/L   Alkaline Phosphatase 82 38 - 126 U/L   Total Bilirubin 0.7 0.3 - 1.2 mg/dL   GFR calc non Af Amer 44 (L) >60 mL/min   GFR calc Af Amer 51 (L) >60 mL/min   Anion gap 8 5 - 15  Lipase, blood  Result Value Ref Range   Lipase 51 11 - 51 U/L  Troponin I  Result Value Ref Range   Troponin I <0.03 <0.03 ng/mL  CBC with Differential  Result Value Ref Range   WBC 6.9 4.0 - 10.5 K/uL   RBC 3.66 (L) 4.22 - 5.81 MIL/uL   Hemoglobin 8.1 (L) 13.0 - 17.0 g/dL   HCT 28.1 (L) 39.0 - 52.0 %   MCV 76.8 (L) 80.0 - 100.0 fL   MCH 22.1 (L) 26.0 - 34.0 pg   MCHC 28.8 (L) 30.0 - 36.0 g/dL   RDW 17.4 (H) 11.5 - 15.5 %   Platelets 205 150 - 400 K/uL   nRBC 0.0 0.0 - 0.2 %   Neutrophils Relative % 65 %   Neutro Abs 4.5 1.7 - 7.7 K/uL   Lymphocytes Relative 21 %   Lymphs Abs 1.4 0.7 - 4.0 K/uL   Monocytes Relative 11 %   Monocytes Absolute 0.7 0.1 - 1.0 K/uL   Eosinophils Relative 3 %   Eosinophils Absolute 0.2 0.0 - 0.5 K/uL   Basophils Relative 0 %   Basophils Absolute 0.0 0.0 - 0.1 K/uL   Immature Granulocytes 0 %   Abs Immature Granulocytes 0.03 0.00 - 0.07 K/uL  Ethanol  Result Value Ref Range   Alcohol,  Ethyl (B) <10 <10 mg/dL  Protime-INR  Result Value Ref Range   Prothrombin Time 13.2 11.4 - 15.2 seconds   INR 1.01   APTT  Result Value Ref Range   aPTT 32 24 - 36 seconds   Dg Chest 2 View Result Date: 01/26/2018 CLINICAL DATA:  Dizziness EXAM: CHEST - 2 VIEW COMPARISON:  12/19/2017, 11/03/2017, 10/31/2016 FINDINGS: Chronic pleural and parenchymal changes on the left. Post sternotomy changes and valve prosthesis. No focal consolidation. Normal heart size. Aortic atherosclerosis. No pneumothorax. IMPRESSION: No active cardiopulmonary disease. Stable chronic pleural and parenchymal changes at the left base. Electronically Signed   By: Donavan Foil M.D.   On: 01/26/2018 16:48   Mr Jodene Nam Head Wo Contrast Result Date: 01/26/2018 CLINICAL DATA:  Dizziness for 2 days. EXAM: MRI HEAD WITHOUT CONTRAST MRA HEAD WITHOUT CONTRAST TECHNIQUE: Multiplanar, multiecho pulse sequences of the brain and surrounding structures were obtained without intravenous contrast. Angiographic images of the head were obtained using MRA technique without contrast. COMPARISON:  10/31/2016 FINDINGS: MRI HEAD FINDINGS Brain: Tiny acute infarcts about the frontal horn of the right lateral ventricle and atrium of the left lateral ventricle. No hemorrhage or obstructive hydrocephalus. No  mass or collection. Advanced atrophy. There is a degree of corpus callosum up lifting in communicating hydrocephalus is not excluded. Chronic small vessel ischemia with moderate confluent rim around the lateral ventricles. Chronic superficial siderosis along the right frontal convexity. Vascular: Chronic loss of right vertebral flow void, see below. Skull and upper cervical spine: No focal marrow lesion. Sinuses/Orbits: Bilateral cataract resection. MRA HEAD FINDINGS Motion degraded. No branch occlusion, beading, or aneurysm. There is atheromatous irregularity of the left vertebral, basilar, and bilateral carotids without flow limiting stenosis. No flow is  seen within the right vertebral artery, chronic based on prior T2 weighted imaging. Flow is still visible within the visible right PICA. Poor signal in the distal posterior cerebral arteries is likely due to direction of flow. IMPRESSION: Brain MRI: 1. Two punctate acute infarcts in the periventricular white matter, 1 in each cerebral hemisphere. 2. Advanced atrophy. There may be superimposed communicating hydrocephalus. Intracranial MRA: 1. No emergent finding. 2. Chronic occlusion of the right vertebral artery. Electronically Signed   By: Monte Fantasia M.D.   On: 01/26/2018 16:37    Mr Brain Wo Contrast (neuro Protocol) Result Date: 01/26/2018 CLINICAL DATA:  Dizziness for 2 days. EXAM: MRI HEAD WITHOUT CONTRAST MRA HEAD WITHOUT CONTRAST TECHNIQUE: Multiplanar, multiecho pulse sequences of the brain and surrounding structures were obtained without intravenous contrast. Angiographic images of the head were obtained using MRA technique without contrast. COMPARISON:  10/31/2016 FINDINGS: MRI HEAD FINDINGS Brain: Tiny acute infarcts about the frontal horn of the right lateral ventricle and atrium of the left lateral ventricle. No hemorrhage or obstructive hydrocephalus. No mass or collection. Advanced atrophy. There is a degree of corpus callosum up lifting in communicating hydrocephalus is not excluded. Chronic small vessel ischemia with moderate confluent rim around the lateral ventricles. Chronic superficial siderosis along the right frontal convexity. Vascular: Chronic loss of right vertebral flow void, see below. Skull and upper cervical spine: No focal marrow lesion. Sinuses/Orbits: Bilateral cataract resection. MRA HEAD FINDINGS Motion degraded. No branch occlusion, beading, or aneurysm. There is atheromatous irregularity of the left vertebral, basilar, and bilateral carotids without flow limiting stenosis. No flow is seen within the right vertebral artery, chronic based on prior T2 weighted imaging. Flow  is still visible within the visible right PICA. Poor signal in the distal posterior cerebral arteries is likely due to direction of flow. IMPRESSION: Brain MRI: 1. Two punctate acute infarcts in the periventricular white matter, 1 in each cerebral hemisphere. 2. Advanced atrophy. There may be superimposed communicating hydrocephalus. Intracranial MRA: 1. No emergent finding. 2. Chronic occlusion of the right vertebral artery. Electronically Signed   By: Monte Fantasia M.D.   On: 01/26/2018 16:37     1650: MRI with acute stroke x2. Pt not code stroke due to delay in presentation.  T/C returned from [facility] Dr. Stark Klein, case discussed, including:  HPI, pertinent PM/SHx, VS/PE, dx testing, ED course and treatment:  Agrees with MRI, admit for further stroke workup.   1755:  Dx and testing d/w pt and family.  Questions answered.  Verb understanding, agreeable to admit. T/C returned from [facility] Dr. Denton Brick, case discussed, including:  HPI, pertinent PM/SHx, VS/PE, dx testing, ED course and treatment:  Agreeable to admit.     Final Clinical Impressions(s) / ED Diagnoses   Final diagnoses:  None    ED Discharge Orders    None       Francine Graven, DO 01/31/18 0730

## 2018-01-26 NOTE — H&P (Signed)
History and Physical    CHEY CHO GOT:[id number] DOB: [birth year] DOA: 01/26/2018  PCP: Chipper Herb, MD   Patient coming from: Home  Chief Complaint: Fall, dizziness  HPI: [name] is a 84 y.o. male with medical history significant for BPH, CAD, HTN, DM2, aortic stenosis with hx of aortic valve replacement, who presented to the ED with complaints of dizziness over the past 2 days and multiple falls yesterday and today.  Dizziness is mostly when standing. He reports good PO intake, without vomiting, loose stool sor abdominal pain.  No fever no chills.  No chest pain no difficulty breathing.  1 of the falls patient hit his head on his right shoulder.  Denies weakness of extremities, no numbness or normal sensation, no slurred speech or facial droop.  His blood pressure medications were recently adjusted due to low blood pressures.  Reports compliance with 81 mg aspirin daily.  Denies black stools, vomiting blood or blood in stools over the past month.  Recent hospital admission 10/3- 10/6-syncopal episode dizziness, anemia-Hgb 8.1.1 transfused 1 unit.  21-day event monitor was arranged and patient was to follow-up with cardiology on discharge. He has also had colonoscopy and EGD 10/6-5 mm polyp-tubular adenoma without high-grade dysplasia on pathology, also diverticulosis and external hemorrhoids.  EGD-erosive gastropathy duodenal erosions without bleeding.  H. pylori was negative.  Recommendations were to avoid NSAIDs aspirin.  ED Course: Systolic blood pressure 597- 128.  Otherwise stable vitals.  Creatinine improving compared to prior at 1.4 .  Hemoglobin is 8.1 gradual drift down over the past month from 9.3.  Troponin WNL <0.63.  MRI brain/MRA- 2 punctate acute infarcts, advanced atrophy, chronic occlusion of the right vertebral artery on MRA.  Orthostatic vitals were negative.  Given meclizine in the ED.  Review of Systems: As per HPI other systems reviewed and negative.  Past  Medical History:  Diagnosis Date  . Anemia    Iron deficiency  . Aortic stenosis    s/p pericardial AVR  . Aortic valve disorder 06/23/2008   Qualifier: Diagnosis of  By: Mare Ferrari, RMA, Sherri    . Arthritis   . Benign prostatic hypertrophy   . CAD (coronary artery disease) 01/30/2011  . Carotid bruit 08/21/2016  . COLONIC POLYPS 06/23/2008   Qualifier: Diagnosis of  By: Mare Ferrari, RMA, Sherri    . Coronary artery disease    s/p CABG 08/2010    . Diabetes mellitus   . Dyslipidemia 10/15/2017  . Essential hypertension 08/21/2016  . H/O aortic valve replacement 08/21/2016  . Hyperlipidemia   . Hyperlipidemia LDL goal <70 06/23/2008   Qualifier: Diagnosis of  By: Mare Ferrari, RMA, Sherri    . Leg swelling 10/15/2017  . Orthostatic hypotension 06/22/2012  . Palpitations 08/21/2016  . Precordial pain 09/18/2011  . Type 2 diabetes mellitus with hyperlipidemia ([place]) 06/23/2008   Qualifier: Diagnosis of  By: Mare Ferrari, RMA, Sherri    . Unstable angina ([place]) 07/24/2011    Past Surgical History:  Procedure Laterality Date  . AORTIC VALVE REPLACEMENT  08/24/10   2m pericardial tissue valve  . COLONOSCOPY WITH PROPOFOL N/A 12/28/2017   Procedure: COLONOSCOPY WITH PROPOFOL;  Surgeon: RRogene Houston MD;  Location: [facility];  Service: Endoscopy;  Laterality: N/A;  . CORONARY ARTERY BYPASS GRAFT  08/24/10   LIMA to LAD, SVG to ramus intermediate, SVG to diagonal  . ESOPHAGOGASTRODUODENOSCOPY (EGD) WITH PROPOFOL N/A 12/28/2017   Procedure: ESOPHAGOGASTRODUODENOSCOPY (EGD) WITH PROPOFOL;  Surgeon: RRogene Houston MD;  Location: [facility];  Service: Endoscopy;  Laterality: N/A;  . HERNIA REPAIR    . knee replacements     x 2  . POLYPECTOMY  12/28/2017   Procedure: POLYPECTOMY;  Surgeon: Rogene Houston, MD;  Location: [facility];  Service: Endoscopy;;  transverse colon      reports that he quit smoking about 24 years ago. His smoking use included cigars. He has never used smokeless tobacco. He  reports that he drinks alcohol. He reports that he does not use drugs.  No Known Allergies  Family History  Problem Relation Age of Onset  . Cancer Mother   . Diabetes Father   . Hypertension Other   . Diabetes Other     Prior to Admission medications   Medication Sig Start Date End Date Taking? Authorizing Provider  aspirin 81 MG tablet Take 1 tablet (81 mg total) by mouth every morning. 12/25/17  Yes Chipper Herb, MD  escitalopram (LEXAPRO) 20 MG tablet Take 0.5 tablets (10 mg total) by mouth every evening. 12/26/17  Yes Johnson, Clanford L, MD  metFORMIN (GLUCOPHAGE) 500 MG tablet Take 1 tablet (500 mg total) by mouth 2 (two) times daily. 12/25/17  Yes Chipper Herb, MD  metoprolol succinate (TOPROL-XL) 25 MG 24 hr tablet Take 0.5 tablets (12.5 mg total) by mouth daily before breakfast. 01/21/18  Yes Chipper Herb, MD  nitroGLYCERIN (NITROSTAT) 0.4 MG SL tablet Place 1 tablet (0.4 mg total) under the tongue every 5 (five) minutes x 3 doses as needed for chest pain. 08/21/16  Yes Minus Breeding, MD  pantoprazole (PROTONIX) 40 MG tablet Take 1 tablet (40 mg total) by mouth 2 (two) times daily before a meal. 01/16/18 02/15/18 Yes Chipper Herb, MD  rosuvastatin (CRESTOR) 40 MG tablet Take 1 tablet (40 mg total) by mouth every evening. 12/25/17 03/25/18 Yes Chipper Herb, MD  ACCU-CHEK SOFTCLIX LANCETS lancets Use as instructed 09/15/15   Chipper Herb, MD  blood glucose meter kit and supplies Check BS up to four times daily.Dx E11.9 01/09/18   Chipper Herb, MD  ferrous sulfate 325 (65 FE) MG tablet Take 1 tablet (325 mg total) by mouth daily with breakfast for 14 days. Patient not taking: Reported on 01/26/2018 12/29/17 01/12/18  Murlean Iba, MD  glucose blood (ACCU-CHEK AVIVA) test strip Test BS QD and PRN 11/23/15   Chipper Herb, MD    Physical Exam: Vitals:   01/26/18 1447 01/26/18 1448 01/26/18 1500 01/26/18 1530  BP: 105/69  111/65 102/65  Pulse: 90  90 88    Resp: '18  16 19  ' Temp: 98.6 F (37 C)     TempSrc: Oral     SpO2: 95%  98% 98%  Weight:  78 kg    Height:  '5\' 9"'  (1.753 m)      Constitutional: NAD, calm, comfortable Vitals:   01/26/18 1447 01/26/18 1448 01/26/18 1500 01/26/18 1530  BP: 105/69  111/65 102/65  Pulse: 90  90 88  Resp: '18  16 19  ' Temp: 98.6 F (37 C)     TempSrc: Oral     SpO2: 95%  98% 98%  Weight:  78 kg    Height:  '5\' 9"'  (1.753 m)     Eyes: PERRL, lids and conjunctivae normal ENMT: Mucous membranes are moist. Posterior pharynx clear of any exudate or lesions. Neck: normal, supple, no masses, no thyromegaly Respiratory: clear to auscultation bilaterally, no wheezing, no crackles. Normal  respiratory effort. No accessory muscle use.  Cardiovascular: Regular rate and rhythm, no murmurs / rubs / gallops. No extremity edema. 2+ pedal pulses. No carotid bruits.  Abdomen: no tenderness, no masses palpated. No hepatosplenomegaly. Bowel sounds positive.  Musculoskeletal: no clubbing / cyanosis. No joint deformity upper and lower extremities. Good ROM, no contractures. Normal muscle tone.  Skin: no rashes, lesions, ulcers. No induration Neurologic: CN 2-12 grossly intact.  Strength 5/5 in all 4.  Sensation intact. Psychiatric: Normal judgment and insight. Alert and oriented x 3. Normal mood.  Occasionally his answers to questions are not correct/answers not really wants to question- ?  Hearing deficit Vs cognitive deficit.  Labs on Admission: I have personally reviewed following labs and imaging studies  CBC: Recent Labs  Lab 01/21/18 1058 01/26/18 1645  WBC 8.3 6.9  NEUTROABS 5.2 4.5  HGB 8.8* 8.1*  HCT 28.9* 28.1*  MCV 74* 76.8*  PLT 251 938   Basic Metabolic Panel: Recent Labs  Lab 01/21/18 1058 01/26/18 1645  NA 137 135  K 4.9 4.0  CL 99 100  CO2 25 27  GLUCOSE 100* 123*  BUN 24 16  CREATININE 1.92* 1.42*  CALCIUM 9.1 8.6*   GFR: Estimated Creatinine Clearance: 39.4 mL/min (A) (by C-G formula  based on SCr of 1.42 mg/dL (H)). Liver Function Tests: Recent Labs  Lab 01/26/18 1645  AST 33  ALT 20  ALKPHOS 82  BILITOT 0.7  PROT 6.6  ALBUMIN 3.2*   Recent Labs  Lab 01/26/18 1645  LIPASE 51   No results for input(s): AMMONIA in the last 168 hours. Coagulation Profile: Recent Labs  Lab 01/26/18 1645  INR 1.01   Cardiac Enzymes: Recent Labs  Lab 01/26/18 1645  TROPONINI <0.03   Urine analysis:    Component Value Date/Time   COLORURINE YELLOW 12/25/2017 1923   APPEARANCEUR CLEAR 12/25/2017 1923   APPEARANCEUR Clear 07/24/2016 1110   LABSPEC 1.009 12/25/2017 1923   PHURINE 7.0 12/25/2017 1923   GLUCOSEU NEGATIVE 12/25/2017 1923   HGBUR NEGATIVE 12/25/2017 [place] NEGATIVE 12/25/2017 1923   BILIRUBINUR Negative 07/24/2016 [place] 12/25/2017 1923   PROTEINUR NEGATIVE 12/25/2017 1923   UROBILINOGEN negative 08/23/2014 1030   UROBILINOGEN 0.2 08/21/2010 1150   NITRITE NEGATIVE 12/25/2017 1923   LEUKOCYTESUR NEGATIVE 12/25/2017 1923   LEUKOCYTESUR Negative 07/24/2016 1110    Radiological Exams on Admission: Dg Chest 2 View  Result Date: 01/26/2018 CLINICAL DATA:  Dizziness EXAM: CHEST - 2 VIEW COMPARISON:  12/19/2017, 11/03/2017, 10/31/2016 FINDINGS: Chronic pleural and parenchymal changes on the left. Post sternotomy changes and valve prosthesis. No focal consolidation. Normal heart size. Aortic atherosclerosis. No pneumothorax. IMPRESSION: No active cardiopulmonary disease. Stable chronic pleural and parenchymal changes at the left base. Electronically Signed   By: Donavan Foil M.D.   On: 01/26/2018 16:48   Mr Jodene Nam Head Wo Contrast  Result Date: 01/26/2018 CLINICAL DATA:  Dizziness for 2 days. EXAM: MRI HEAD WITHOUT CONTRAST MRA HEAD WITHOUT CONTRAST TECHNIQUE: Multiplanar, multiecho pulse sequences of the brain and surrounding structures were obtained without intravenous contrast. Angiographic images of the head were obtained using  MRA technique without contrast. COMPARISON:  10/31/2016 FINDINGS: MRI HEAD FINDINGS Brain: Tiny acute infarcts about the frontal horn of the right lateral ventricle and atrium of the left lateral ventricle. No hemorrhage or obstructive hydrocephalus. No mass or collection. Advanced atrophy. There is a degree of corpus callosum up lifting in communicating hydrocephalus is not excluded. Chronic small  vessel ischemia with moderate confluent rim around the lateral ventricles. Chronic superficial siderosis along the right frontal convexity. Vascular: Chronic loss of right vertebral flow void, see below. Skull and upper cervical spine: No focal marrow lesion. Sinuses/Orbits: Bilateral cataract resection. MRA HEAD FINDINGS Motion degraded. No branch occlusion, beading, or aneurysm. There is atheromatous irregularity of the left vertebral, basilar, and bilateral carotids without flow limiting stenosis. No flow is seen within the right vertebral artery, chronic based on prior T2 weighted imaging. Flow is still visible within the visible right PICA. Poor signal in the distal posterior cerebral arteries is likely due to direction of flow. IMPRESSION: Brain MRI: 1. Two punctate acute infarcts in the periventricular white matter, 1 in each cerebral hemisphere. 2. Advanced atrophy. There may be superimposed communicating hydrocephalus. Intracranial MRA: 1. No emergent finding. 2. Chronic occlusion of the right vertebral artery. Electronically Signed   By: Monte Fantasia M.D.   On: 01/26/2018 16:37   Mr Brain Wo Contrast (neuro Protocol)  Result Date: 01/26/2018 CLINICAL DATA:  Dizziness for 2 days. EXAM: MRI HEAD WITHOUT CONTRAST MRA HEAD WITHOUT CONTRAST TECHNIQUE: Multiplanar, multiecho pulse sequences of the brain and surrounding structures were obtained without intravenous contrast. Angiographic images of the head were obtained using MRA technique without contrast. COMPARISON:  10/31/2016 FINDINGS: MRI HEAD FINDINGS  Brain: Tiny acute infarcts about the frontal horn of the right lateral ventricle and atrium of the left lateral ventricle. No hemorrhage or obstructive hydrocephalus. No mass or collection. Advanced atrophy. There is a degree of corpus callosum up lifting in communicating hydrocephalus is not excluded. Chronic small vessel ischemia with moderate confluent rim around the lateral ventricles. Chronic superficial siderosis along the right frontal convexity. Vascular: Chronic loss of right vertebral flow void, see below. Skull and upper cervical spine: No focal marrow lesion. Sinuses/Orbits: Bilateral cataract resection. MRA HEAD FINDINGS Motion degraded. No branch occlusion, beading, or aneurysm. There is atheromatous irregularity of the left vertebral, basilar, and bilateral carotids without flow limiting stenosis. No flow is seen within the right vertebral artery, chronic based on prior T2 weighted imaging. Flow is still visible within the visible right PICA. Poor signal in the distal posterior cerebral arteries is likely due to direction of flow. IMPRESSION: Brain MRI: 1. Two punctate acute infarcts in the periventricular white matter, 1 in each cerebral hemisphere. 2. Advanced atrophy. There may be superimposed communicating hydrocephalus. Intracranial MRA: 1. No emergent finding. 2. Chronic occlusion of the right vertebral artery. Electronically Signed   By: Monte Fantasia M.D.   On: 01/26/2018 16:37    EKG: Independently reviewed.  Sinus rhythm.  Inverted T waves 3 aVF.  Q waves 2 3 aVF.  These are new compared to prior EKG  Assessment/Plan Principal Problem:   Dizziness Active Problems:   Type 2 diabetes mellitus with hyperlipidemia ([facility])   Essential hypertension   H/O aortic valve replacement   Symptomatic anemia   Dizziness- with multiple falls.  Likely symptomatic anemia, Hgb 8.1 gradual downtrend over the past month from 9.3.  Low MCV, increased RDW, recent ferritin- 15, 12/24/17- serum iron- 13   and iron saturation-4,  suggestive of iron deficiency. Recent admission for same transfused 1 unit.  12/28/17- EGD and colonoscopy with gastropathy duodenal erosions diverticulosis.  No dark stools in >2-3 months.  Negative orthostatic vitals.  Doubt dizziness attributable to cerebral infarcts.  He has no focal deficits. Trop x 1 neg. -Transfuse 1 unit PRBC -CBC a.m. - Trops x 2  Acute CVA-on MRI/MRA  brain- 2 punctate acute infarcts in the periventricular white matter one in each cerebral hemisphere. At this time these appear to be incidental findings.  He has no focal deficits.  Passed bedside swallow eval. recent echo for palpitations-09/2017 EF 65 to 70%, G1DD.  Compliant with 81 mg daily - Recent carotid Dopplers 10/4-bilateral carotid bifurcation and proximal ICA plaque resulting in less than 50% diameter stenosis. - PT eval -Repeat echo to rule out thrombus -Telemetry monitoring -Neurology consult -Aspirin 3251, continue 81 mg daily  Aortic valve replacement, CAD hx-follows with Dr.Hockrein.  EKG shows inverted T waves 3 aVF and Q waves 2 3 aVF new compared to old EKG. No chest pain, no shortness of breath at rest or with exertion. -EKG a.m. - Trop x 2  DM-glucose. 07/2017.  Glucose 123. Hgba1c-5.8. ?Tight control. - Continue home Metformin - SSI  HTN-stable.  Recent medication adjustments by PCP for low blood pressures. -Hold home metoprolol 12.5 daily, to allow for permissive attention in the setting of Acute CVA.   DVT prophylaxis: SCDs for now with Anemia.  Code Status:  Full Family Communication: Daughter at bedside Disposition Plan: 1- 2 days Consults called: neurology Admission status: Obs, tele   Bethena Roys MD [facility] Pager [phone] From 3PM-11PM.  Otherwise please contact night-coverage [email or web address] Password [facility]  01/26/2018, 8:19 PM

## 2018-01-26 NOTE — ED Notes (Signed)
Shoulder xray refused

## 2018-01-26 NOTE — ED Notes (Signed)
Pt unable to give urine sample at this time 

## 2018-01-26 NOTE — ED Notes (Signed)
Pt seems forgetful, repeats same questions and gives the same information frequently.

## 2018-01-26 NOTE — ED Triage Notes (Addendum)
Dizzy for last 2 days.  Fell at home hit back of head yesterday and fell this am and hit right shoulder.  Small bruising noted.  Denies pain.  LKW 01/25/18 @ 1700.

## 2018-01-26 NOTE — ED Notes (Signed)
Daughter- [name] [phone] SIL- Truett Perna [phone]  Daughter will be here to visit in the morning 11/5, requested to be called with updates if anything changes.

## 2018-01-27 ENCOUNTER — Observation Stay (HOSPITAL_BASED_OUTPATIENT_CLINIC_OR_DEPARTMENT_OTHER): Payer: Medicare HMO

## 2018-01-27 ENCOUNTER — Other Ambulatory Visit: Payer: Self-pay

## 2018-01-27 DIAGNOSIS — R296 Repeated falls: Secondary | ICD-10-CM

## 2018-01-27 DIAGNOSIS — D649 Anemia, unspecified: Secondary | ICD-10-CM

## 2018-01-27 DIAGNOSIS — F0390 Unspecified dementia without behavioral disturbance: Secondary | ICD-10-CM | POA: Diagnosis present

## 2018-01-27 DIAGNOSIS — F0280 Dementia in other diseases classified elsewhere without behavioral disturbance: Secondary | ICD-10-CM

## 2018-01-27 DIAGNOSIS — I350 Nonrheumatic aortic (valve) stenosis: Secondary | ICD-10-CM | POA: Diagnosis present

## 2018-01-27 DIAGNOSIS — E785 Hyperlipidemia, unspecified: Secondary | ICD-10-CM | POA: Diagnosis present

## 2018-01-27 DIAGNOSIS — Z952 Presence of prosthetic heart valve: Secondary | ICD-10-CM | POA: Diagnosis not present

## 2018-01-27 DIAGNOSIS — N400 Enlarged prostate without lower urinary tract symptoms: Secondary | ICD-10-CM | POA: Diagnosis present

## 2018-01-27 DIAGNOSIS — R42 Dizziness and giddiness: Secondary | ICD-10-CM

## 2018-01-27 DIAGNOSIS — G301 Alzheimer's disease with late onset: Secondary | ICD-10-CM | POA: Diagnosis not present

## 2018-01-27 DIAGNOSIS — I5030 Unspecified diastolic (congestive) heart failure: Secondary | ICD-10-CM | POA: Diagnosis not present

## 2018-01-27 DIAGNOSIS — E860 Dehydration: Secondary | ICD-10-CM | POA: Diagnosis present

## 2018-01-27 DIAGNOSIS — E1169 Type 2 diabetes mellitus with other specified complication: Secondary | ICD-10-CM

## 2018-01-27 DIAGNOSIS — Z7982 Long term (current) use of aspirin: Secondary | ICD-10-CM | POA: Diagnosis not present

## 2018-01-27 DIAGNOSIS — I2510 Atherosclerotic heart disease of native coronary artery without angina pectoris: Secondary | ICD-10-CM | POA: Diagnosis present

## 2018-01-27 DIAGNOSIS — Z66 Do not resuscitate: Secondary | ICD-10-CM | POA: Diagnosis present

## 2018-01-27 DIAGNOSIS — Z79899 Other long term (current) drug therapy: Secondary | ICD-10-CM | POA: Diagnosis not present

## 2018-01-27 DIAGNOSIS — I10 Essential (primary) hypertension: Secondary | ICD-10-CM

## 2018-01-27 DIAGNOSIS — I639 Cerebral infarction, unspecified: Secondary | ICD-10-CM | POA: Diagnosis present

## 2018-01-27 DIAGNOSIS — Z7984 Long term (current) use of oral hypoglycemic drugs: Secondary | ICD-10-CM | POA: Diagnosis not present

## 2018-01-27 LAB — TYPE AND SCREEN
ABO/RH(D): O POS
ANTIBODY SCREEN: NEGATIVE
UNIT DIVISION: 0

## 2018-01-27 LAB — ECHOCARDIOGRAM COMPLETE
HEIGHTINCHES: 69 in
WEIGHTICAEL: 2752 [oz_av]

## 2018-01-27 LAB — BPAM RBC
Blood Product Expiration Date: 201912022359
ISSUE DATE / TIME: 201911042332
UNIT TYPE AND RH: 5100

## 2018-01-27 LAB — CBC
HCT: 29.9 % — ABNORMAL LOW (ref 39.0–52.0)
HEMOGLOBIN: 8.9 g/dL — AB (ref 13.0–17.0)
MCH: 23.2 pg — ABNORMAL LOW (ref 26.0–34.0)
MCHC: 29.8 g/dL — ABNORMAL LOW (ref 30.0–36.0)
MCV: 78.1 fL — AB (ref 80.0–100.0)
NRBC: 0 % (ref 0.0–0.2)
Platelets: 194 10*3/uL (ref 150–400)
RBC: 3.83 MIL/uL — AB (ref 4.22–5.81)
RDW: 17.4 % — ABNORMAL HIGH (ref 11.5–15.5)
WBC: 6.1 10*3/uL (ref 4.0–10.5)

## 2018-01-27 LAB — GLUCOSE, CAPILLARY
GLUCOSE-CAPILLARY: 131 mg/dL — AB (ref 70–99)
GLUCOSE-CAPILLARY: 97 mg/dL (ref 70–99)
Glucose-Capillary: 96 mg/dL (ref 70–99)

## 2018-01-27 LAB — TROPONIN I: Troponin I: <0.03 ng/mL (ref <0.03)

## 2018-01-27 NOTE — Progress Notes (Signed)
Called to room by staff nurse to help with patient.  Patient has become increasingly confused during the shift, does not understand why he is in the hospital.  Patient is belligerent and verbally aggressive.  Security has been called to talk to patient to help him understand he cannot walk out of the hospital.  Family has been called and updated on patient behavior, but family is unable to come to hospital at this time.

## 2018-01-27 NOTE — Discharge Summary (Signed)
Physician Discharge Summary  Patient ID: [name] MRN: [id number] DOB/AGE: 08/09/1932 84 y.o.  Admit date: 01/26/2018 Discharge date: 01/27/2018  Admission Diagnoses:   Fall / dizziness   Small acute CVA x 2 by MRI   Dementia without behavioral disturbance   Type 2 diabetes mellitus with hyperlipidemia   Essential hypertension   H/O aortic valve replacement   Falls frequently  Discharge Diagnoses:    Fall   Small acute CVA x 2   Dementia without behavioral disturbance   Type 2 diabetes mellitus with hyperlipidemia   Essential hypertension   H/O aortic valve replacement   Falls frequently   DNR (do not resuscitate)   Discharged Condition: fair  Presentation Summary: [name] is a 84 y.o. male with medical history significant for BPH, CAD, HTN, DM2, aortic stenosis with hx of aortic valve replacement, who presented to the ED with complaints of dizziness over the past 2 days and multiple falls yesterday and today.  Dizziness is mostly when standing. He reports good PO intake, without vomiting, loose stool sor abdominal pain.  No fever no chills.  No chest pain no difficulty breathing.  1 of the falls patient hit his head on his right shoulder.  Denies weakness of extremities, no numbness or normal sensation, no slurred speech or facial droop.  His blood pressure medications were recently adjusted due to low blood pressures.  Reports compliance with 81 mg aspirin daily.  Denies black stools, vomiting blood or blood in stools over the past month.  Recent hospital admission 10/3- 10/6-syncopal episode dizziness, anemia-Hgb 8.1.1 transfused 1 unit.  21-day event monitor was arranged and patient was to follow-up with cardiology on discharge. He has also had colonoscopy and EGD 10/6-5 mm polyp-tubular adenoma without high-grade dysplasia on pathology, also diverticulosis and external hemorrhoids.  EGD-erosive gastropathy duodenal erosions without bleeding.  H. pylori was negative.   Recommendations were to avoid NSAIDs aspirin.  ED Course: Systolic blood pressure 505- 128.  Otherwise stable vitals.  Creatinine improving compared to prior at 1.4 .  Hemoglobin is 8.1 gradual drift down over the past month from 9.3.  Troponin WNL <0.63.  MRI brain/MRA- 2 punctate acute infarcts, advanced atrophy, chronic occlusion of the right vertebral artery on MRA.  Orthostatic vitals were negative.  Given meclizine in the ED.   Hospital Course:    Dizziness- with falls.  Likely symptomatic anemia and/ or dementia/ dehydration. Hb 8.9 here. Recent admission for same transfused 1 unit.  12/28/17- EGD and colonoscopy with gastropathy duodenal erosions diverticulosis.  No dark stools in >2-3 months.  Negative orthostatic vitals.  Doubt dizziness attributable to cerebral infarcts.  He has no focal deficits. Trop x 1 neg. -Transfused 1 unit PRBC - Trops neg x 3  Acute CVA/ dementia -on MRI/MRA brain- 2 punctate acute infarcts in the periventricular white matter one in each cerebral hemisphere. At this time these appear to be incidental findings.  He has no focal deficits.  Passed bedside swallow eval. recent echo for palpitations-09/2017 EF 65 to 70%, G1DD.  Compliant with 81 mg daily - Recent carotid Dopplers 10/4-bilateral carotid bifurcation and proximal ICA plaque resulting in less than 50% diameter stenosis -Repeat echo was normal , no new changes, EF wnl -Telemetry monitoring showed no atrial fib - daughter states pt's memory has been worsening starting about 1 yr ago (1 yr after his wife died) and has been progressing w/ pt being very forgetful on a daily basis.  They have a sitter  8- 5p on Mon-Friday for him and she watches him on the weekends.  They have not seen any doctors for this diagnosis.  Discussed w/ her that dementia tends to be progressive and prognosis is not good overall. Questions answered.  - I d/w neurology on call re: dementia and small acute CVA's, given significant and  advancing dementia (> 1 yr), family prefers local Rx in OP setting. Patient to continue asa 81 mg / d and is already on high-dose statin Rx.  Can see neurology for dementia / CVA in OP setting if family is interested.  OK for dc home today.   - poor prognosis overall w/ advancing dementia, have d/w daughter at length - pt and daughter requesting no heroic measures in case of cardioresp arrest, DNR order written  Aortic valve replacement, CAD hx- tissue valve, follows with Dr.Hockrein.  EKG shows inverted T waves 3 aVF and Q waves 2 3 aVF new compared to old EKG. No chest pain, no shortness of breath at rest or with exertion.   DM-glucose. 07/2017.  Glucose 123. Hgba1c-5.8. ?Tight control. - resume home Metformin   HTN-stable.  cont metoprolol low dose 12.5 qd.      Discharge Exam: Blood pressure 124/66, pulse (!) 52, temperature 99.1 F (37.3 C), temperature source Oral, resp. rate 16, height '5\' 9"'  (1.753 m), weight 78 kg, SpO2 96 %. Eyes: PERRL, lids and conjunctivae normal ENMT: Mucous membranes are moist. Posterior pharynx clear of any exudate or lesions. Neck: normal, supple, no masses, no thyromegaly Respiratory: clear to auscultation bilaterally, no wheezing, no crackles. Normal respiratory effort. No accessory muscle use.  Cardiovascular: Regular rate and rhythm, no murmurs / rubs / gallops. No extremity edema. 2+ pedal pulses. No carotid bruits.  Abdomen: no tenderness, no masses palpated. No hepatosplenomegaly. Bowel sounds positive.  Musculoskeletal: no clubbing / cyanosis. No joint deformity upper and lower extremities. Good ROM, no contractures. Normal muscle tone.  Skin: no rashes, lesions, ulcers. No induration Neurologic: CN 2-12 grossly intact.  Strength 5/5 in all 4.  Sensation intact. Psychiatric: Normal judgment and insight. Alert and oriented x 2. Doesn't remember coming her last night.   Disposition: Discharge disposition: 01-Home or Self  Care        Allergies as of 01/27/2018   No Known Allergies     Medication List    TAKE these medications   ACCU-CHEK SOFTCLIX LANCETS lancets Use as instructed   aspirin 81 MG tablet Take 1 tablet (81 mg total) by mouth every morning.   blood glucose meter kit and supplies Check BS up to four times daily.Dx E11.9   escitalopram 20 MG tablet Commonly known as:  LEXAPRO Take 0.5 tablets (10 mg total) by mouth every evening.   ferrous sulfate 325 (65 FE) MG tablet Take 1 tablet (325 mg total) by mouth daily with breakfast for 14 days.   glucose blood test strip Test BS QD and PRN   metFORMIN 500 MG tablet Commonly known as:  GLUCOPHAGE Take 1 tablet (500 mg total) by mouth 2 (two) times daily.   metoprolol succinate 25 MG 24 hr tablet Commonly known as:  TOPROL-XL Take 0.5 tablets (12.5 mg total) by mouth daily before breakfast.   nitroGLYCERIN 0.4 MG SL tablet Commonly known as:  NITROSTAT Place 1 tablet (0.4 mg total) under the tongue every 5 (five) minutes x 3 doses as needed for chest pain.   pantoprazole 40 MG tablet Commonly known as:  PROTONIX Take 1 tablet (40  mg total) by mouth 2 (two) times daily before a meal.   rosuvastatin 40 MG tablet Commonly known as:  CRESTOR Take 1 tablet (40 mg total) by mouth every evening.      Follow-up Information    Chipper Herb, MD Follow up.   Specialty:  Family Medicine Contact information: [place] [address] [phone]           Signed: Sol Blazing 01/27/2018, 6:11 PM

## 2018-01-27 NOTE — Progress Notes (Signed)
Pt discharged home today per Dr. Jonnie Finner. Pt's IV site D/C'd and WDL. Pt's VSS. Pt provided with home medication list and discharge instructions. Verbalized understanding. Pt left floor via WC in stable condition accompanied by RN.

## 2018-01-27 NOTE — Progress Notes (Signed)
*  PRELIMINARY RESULTS* Echocardiogram 2D Echocardiogram has been performed.  Leavy Cella 01/27/2018, 9:43 AM

## 2018-01-27 NOTE — Progress Notes (Signed)
Patient stated he wanted to talk to the doctor about blood transfusion, MD called patient and spoke with patient. Soon after patient became more confused unable to state date or where he was. Called daughter [name] to get blood consent. Consent was obtained verbally.  Patient became verbally aggressive stating he was going to leave and his son was going to get him. Called daughter and told her about patient becoming more confused and irritated. Daughter was not able to come and stay with patient. Will continue to monitor patient.

## 2018-01-28 ENCOUNTER — Telehealth: Payer: Self-pay | Admitting: *Deleted

## 2018-01-28 NOTE — Telephone Encounter (Signed)
Call Completed and Appointment Scheduled: Yes, 02/06/2018 with Dr Anabel Halon INFORMATION Date of Discharge:01/27/2018  Discharge Facility: [facility]  Principal Discharge Diagnosis: CVA, dementia  Patient and/or caregiver is knowledgeable of his/her condition(s) and treatment: Yes, spoke with daughter, [name]  MEDICATION RECONCILIATION Current medication list reviewed with patient:Yes  Outpatient Encounter Medications as of 01/28/2018  Medication Sig  . ACCU-CHEK SOFTCLIX LANCETS lancets Use as instructed  . aspirin 81 MG tablet Take 1 tablet (81 mg total) by mouth every morning.  . blood glucose meter kit and supplies Check BS up to four times daily.Dx E11.9  . escitalopram (LEXAPRO) 20 MG tablet Take 0.5 tablets (10 mg total) by mouth every evening.  . ferrous sulfate 325 (65 FE) MG tablet Take 1 tablet (325 mg total) by mouth daily with breakfast for 14 days. (Patient not taking: Reported on 01/26/2018)  . glucose blood (ACCU-CHEK AVIVA) test strip Test BS QD and PRN  . metFORMIN (GLUCOPHAGE) 500 MG tablet Take 1 tablet (500 mg total) by mouth 2 (two) times daily.  . metoprolol succinate (TOPROL-XL) 25 MG 24 hr tablet Take 0.5 tablets (12.5 mg total) by mouth daily before breakfast.  . nitroGLYCERIN (NITROSTAT) 0.4 MG SL tablet Place 1 tablet (0.4 mg total) under the tongue every 5 (five) minutes x 3 doses as needed for chest pain.  . pantoprazole (PROTONIX) 40 MG tablet Take 1 tablet (40 mg total) by mouth 2 (two) times daily before a meal.  . rosuvastatin (CRESTOR) 40 MG tablet Take 1 tablet (40 mg total) by mouth every evening.   No facility-administered encounter medications on file as of 01/28/2018.     Discharge Medications reviewed and reconciled with current medications.yes  Patient is able to obtain needed medications:Yes  ACTIVITIES OF DAILY LIVING  Is the patient able to perform his/her own ADLs: Yes.  has assistance from family  Patient is receiving home  health services: Yes.  Nursing and physical therapy. He had this in place prior to admission. They are going to continue to come out.   PATIENT EDUCATION Questions/Concerns Discussed: Decreased appetite and interest in things. He is drinking Boost and they are encouraging him to eat and drink water as well. Recommended that they encourage safe physical activity and socialization to help with depression as well. They will f/u sooner if needed.

## 2018-01-29 ENCOUNTER — Other Ambulatory Visit: Payer: Self-pay | Admitting: *Deleted

## 2018-01-29 DIAGNOSIS — D649 Anemia, unspecified: Secondary | ICD-10-CM | POA: Diagnosis not present

## 2018-01-29 DIAGNOSIS — R55 Syncope and collapse: Secondary | ICD-10-CM | POA: Diagnosis not present

## 2018-01-29 DIAGNOSIS — K449 Diaphragmatic hernia without obstruction or gangrene: Secondary | ICD-10-CM | POA: Diagnosis not present

## 2018-01-29 DIAGNOSIS — I2510 Atherosclerotic heart disease of native coronary artery without angina pectoris: Secondary | ICD-10-CM | POA: Diagnosis not present

## 2018-01-29 DIAGNOSIS — K2990 Gastroduodenitis, unspecified, without bleeding: Secondary | ICD-10-CM | POA: Diagnosis not present

## 2018-01-29 DIAGNOSIS — I5032 Chronic diastolic (congestive) heart failure: Secondary | ICD-10-CM | POA: Diagnosis not present

## 2018-01-29 DIAGNOSIS — E1169 Type 2 diabetes mellitus with other specified complication: Secondary | ICD-10-CM | POA: Diagnosis not present

## 2018-01-29 DIAGNOSIS — I110 Hypertensive heart disease with heart failure: Secondary | ICD-10-CM | POA: Diagnosis not present

## 2018-01-29 DIAGNOSIS — E785 Hyperlipidemia, unspecified: Secondary | ICD-10-CM | POA: Diagnosis not present

## 2018-01-29 NOTE — Patient Outreach (Signed)
[facility] [facility]) Care Management  01/29/2018  [name] 1933/03/18 [id number]   Care coordination/Telephone screen   [facility] RN CM received a  voice message from [name] daughter on 01/28/18 and 01/29/18  [facility] RN CM returned a call to [place] at the work number she recommends as [phone] who states [name] has had a return visit to the hospital and they are interested in getting advance directives completed and finding out about possible sitter assistance to complement the sitter that stays with him from Monday to Friday during the days and some weekends [name] was able to verify HIPAA [name] reports his memory continues to progressively get worse [name] is interested in sitter to stay with [name] "in the evenings until he goes asleep"  [facility] RN CM referred [name] to [facility] SW for community resources to include sitter information and need advance directive  [facility] RN CM will collaborate with [facility] SW and f/u prn with his daughter   [name]. Joseph Hamman, RN, BSN, [place] Coordinator Office number [phone] Mobile number [phone]  Main [facility] number [phone] Fax number [phone]'

## 2018-01-30 ENCOUNTER — Telehealth: Payer: Self-pay | Admitting: *Deleted

## 2018-01-30 ENCOUNTER — Other Ambulatory Visit: Payer: Self-pay

## 2018-01-30 NOTE — Patient Outreach (Addendum)
[place]) Care Management  01/30/2018  [name] [birth year] [id number]  Initial outreach to patient's daughter, [name], for new referral received today to assist with sitter information and Advance Directives.  Per note from [facility], [name], on 01/29/18 daughter requested to be contacted at work number.  BSW attempted to call this number today and received a message that the office closes at noon on Fridays.  BSW also called her mobile number but she did not answer and her voicemail box has not been set up.  BSW will attempt to reach her again within four business days.  Unsuccessful outreach letter mailed.    [name], BSW Social Worker [phone]

## 2018-01-30 NOTE — Telephone Encounter (Signed)
VM from [place] w/ [facility] - visit to pt today, found pt on floor, he stated 'rolled off" loveseat, he has no injuries Otila Kluver is requesting at next weeks visit something for his appetite, he is refusing to eat he is only drinking boost. He is also agitated / aggressive with caregiver

## 2018-02-02 ENCOUNTER — Other Ambulatory Visit: Payer: Self-pay | Admitting: *Deleted

## 2018-02-02 DIAGNOSIS — E1169 Type 2 diabetes mellitus with other specified complication: Secondary | ICD-10-CM | POA: Diagnosis not present

## 2018-02-02 DIAGNOSIS — I5032 Chronic diastolic (congestive) heart failure: Secondary | ICD-10-CM | POA: Diagnosis not present

## 2018-02-02 DIAGNOSIS — K449 Diaphragmatic hernia without obstruction or gangrene: Secondary | ICD-10-CM | POA: Diagnosis not present

## 2018-02-02 DIAGNOSIS — I110 Hypertensive heart disease with heart failure: Secondary | ICD-10-CM | POA: Diagnosis not present

## 2018-02-02 DIAGNOSIS — R55 Syncope and collapse: Secondary | ICD-10-CM | POA: Diagnosis not present

## 2018-02-02 DIAGNOSIS — K2990 Gastroduodenitis, unspecified, without bleeding: Secondary | ICD-10-CM | POA: Diagnosis not present

## 2018-02-02 DIAGNOSIS — E785 Hyperlipidemia, unspecified: Secondary | ICD-10-CM | POA: Diagnosis not present

## 2018-02-02 DIAGNOSIS — D649 Anemia, unspecified: Secondary | ICD-10-CM | POA: Diagnosis not present

## 2018-02-02 DIAGNOSIS — I2510 Atherosclerotic heart disease of native coronary artery without angina pectoris: Secondary | ICD-10-CM | POA: Diagnosis not present

## 2018-02-02 NOTE — Patient Outreach (Signed)
[facility]) Care Management  02/02/2018  [name] 1933-02-28 [id number]   EMMI-general discharge, RED ON EMMI ALERT Day # 1 Date: 01/30/18 Red Alert Reason: unfilled prescriptions?  Yes able to fill today/tomorrow? No    Outreach attempt # 1 unsuccessful to [name] home number, his daughter work and mobile numbers without answers at each      Plan: [place] sent an unsuccessful outreach letter and scheduled this patient for another call attempt within 4 business days   [employer]. Lavina Hamman, RN, BSN, [place] Coordinator Office number [phone] Mobile number [phone]  Main [facility] number [phone] Fax number [phone]

## 2018-02-03 ENCOUNTER — Other Ambulatory Visit: Payer: Self-pay

## 2018-02-03 ENCOUNTER — Other Ambulatory Visit: Payer: Self-pay | Admitting: *Deleted

## 2018-02-03 DIAGNOSIS — E1169 Type 2 diabetes mellitus with other specified complication: Secondary | ICD-10-CM | POA: Diagnosis not present

## 2018-02-03 DIAGNOSIS — R55 Syncope and collapse: Secondary | ICD-10-CM | POA: Diagnosis not present

## 2018-02-03 DIAGNOSIS — D649 Anemia, unspecified: Secondary | ICD-10-CM | POA: Diagnosis not present

## 2018-02-03 DIAGNOSIS — I5032 Chronic diastolic (congestive) heart failure: Secondary | ICD-10-CM | POA: Diagnosis not present

## 2018-02-03 DIAGNOSIS — K449 Diaphragmatic hernia without obstruction or gangrene: Secondary | ICD-10-CM | POA: Diagnosis not present

## 2018-02-03 DIAGNOSIS — I2510 Atherosclerotic heart disease of native coronary artery without angina pectoris: Secondary | ICD-10-CM | POA: Diagnosis not present

## 2018-02-03 DIAGNOSIS — K2990 Gastroduodenitis, unspecified, without bleeding: Secondary | ICD-10-CM | POA: Diagnosis not present

## 2018-02-03 DIAGNOSIS — E785 Hyperlipidemia, unspecified: Secondary | ICD-10-CM | POA: Diagnosis not present

## 2018-02-03 DIAGNOSIS — I110 Hypertensive heart disease with heart failure: Secondary | ICD-10-CM | POA: Diagnosis not present

## 2018-02-03 NOTE — Patient Outreach (Addendum)
[facility]) Care Management  02/03/2018  [name] August 26, 1932 [id number]   Successful outreach to patient's daughter, [name], regarding social work referral for in-home assistance and Advance Directives.   [name] currently has privately hired in-home assistance Monday-Friday during the day but Ms. Benjamin Stain reported needing assistance for a few hours in the evening/night.  Ms. Benjamin Stain is aware that these services are not covered by Medicare.  BSW was unable to provide pricing information because any selected agency will require an in-home assessment first.  BSW agreed to send Ms. Benjamin Stain a list of local providers.  BSW inquired about whether or not patient has ever applied for Medicaid and she reported that he has not.  BSW educated her about services that are covered by Medicaid vs. Medicare and about the application process.  Ms. Benjamin Stain said that she would like to complete Advance Directives for patient.  The following information is being mailed to Ms. Gann: Medicaid Application Items to submit with Medicaid application List of local in-home providers Advance Directive EMMI Advance Directive Packet  BSW will follow up next week to ensure receipt of resources and to review Advance Directives.   [name], BSW Social Worker [phone]

## 2018-02-03 NOTE — Patient Outreach (Addendum)
[facility]) Care Management  02/03/2018  KYUSS HALE [birth year] [id number]   EMMI-general discharge, RED ON EMMI ALERT Day # 1 Date: 01/30/18 Red Alert Reason: unfilled prescriptions? Yes  able to fill today/tomorrow? No    Outreach attempt # 2 successful to Mr [place]'s daughter, [name] after she also left a voice message for CM about pending call from [place] consulted with [facility] SW, A Chrismon prior to calling [name] permission has been given on 01/01/18 for [facility] staff to speak with daughter, [name] Pt with memory issues    [name] is able to verify HIPAA Reviewed and addressed EMMI alerts with [place] Staff confirms the answers to the EMMI questions on Friday are incorrect [name] confirms Mr Agena has all of his medicines and does not have any unfilled prescriptions.   [facility] RN CM discussed [facility] SW attempts to contact her or Mr Neels.without success and discussed connecting her and [facility] SW after the call with CM. [facility] RN CM notified [facility] SW of availability of daughter at the work number listed for her   Social: Mr Ybarra lives at home and has a caregiver hired by the family to assist during the day. His daughter, [name], is his main support person. "I'm all he has right now." per [place]. Christy's husband also assists with home improvements in his home. Mr Sultana is widowed His wife passed 2 years ago and [name] reports that he "misses her and talks about her everyday." He is taking Lexapro Conditions: Aortic valve disorder, CAD, unstable angina, orthostatic hypertension, HTN, syncope, Colonic polyps, Type 2 DM with hyperlipidemia, depression, BPH, iron deficiency anemia, palpitations, carotid bruit, former smoker, degenerative disc disease, chronic diastolic HF Medications: denies concerns with taking medications as prescribed, affording medications, side effects of medications and questions about medications  Appointments: 02/06/18 appointment with primary MD  Redge Gainer  02/11/18 appointment with Dr Percival Spanish cardiology   Advance Directives: does not have advance directives and [name], daughter to work [facility] SW on this  Consent: [place] reviewed [facility] services with patient. Patient gave verbal consent for services.  home number, his daughter work and mobile numbers without answers at each      Plan: [place] CM scheduled this patient for follow up call to his daughter within 8- 10 business days   Kimberly L. Lavina Hamman, RN, BSN, [place] Coordinator Office number [phone] Mobile number [phone]  Main [facility] number [phone] Fax number [phone]

## 2018-02-05 DIAGNOSIS — E785 Hyperlipidemia, unspecified: Secondary | ICD-10-CM | POA: Diagnosis not present

## 2018-02-05 DIAGNOSIS — R55 Syncope and collapse: Secondary | ICD-10-CM | POA: Diagnosis not present

## 2018-02-05 DIAGNOSIS — E1169 Type 2 diabetes mellitus with other specified complication: Secondary | ICD-10-CM | POA: Diagnosis not present

## 2018-02-05 DIAGNOSIS — K449 Diaphragmatic hernia without obstruction or gangrene: Secondary | ICD-10-CM | POA: Diagnosis not present

## 2018-02-05 DIAGNOSIS — I110 Hypertensive heart disease with heart failure: Secondary | ICD-10-CM | POA: Diagnosis not present

## 2018-02-05 DIAGNOSIS — K2990 Gastroduodenitis, unspecified, without bleeding: Secondary | ICD-10-CM | POA: Diagnosis not present

## 2018-02-05 DIAGNOSIS — I2510 Atherosclerotic heart disease of native coronary artery without angina pectoris: Secondary | ICD-10-CM | POA: Diagnosis not present

## 2018-02-05 DIAGNOSIS — D649 Anemia, unspecified: Secondary | ICD-10-CM | POA: Diagnosis not present

## 2018-02-05 DIAGNOSIS — I5032 Chronic diastolic (congestive) heart failure: Secondary | ICD-10-CM | POA: Diagnosis not present

## 2018-02-06 ENCOUNTER — Encounter: Payer: Self-pay | Admitting: Family Medicine

## 2018-02-06 ENCOUNTER — Ambulatory Visit (INDEPENDENT_AMBULATORY_CARE_PROVIDER_SITE_OTHER): Payer: Medicare HMO | Admitting: Family Medicine

## 2018-02-06 VITALS — BP 112/61 | HR 72 | Temp 96.6°F | Ht 69.0 in | Wt 169.0 lb

## 2018-02-06 DIAGNOSIS — D508 Other iron deficiency anemias: Secondary | ICD-10-CM

## 2018-02-06 DIAGNOSIS — F32A Depression, unspecified: Secondary | ICD-10-CM

## 2018-02-06 DIAGNOSIS — F0390 Unspecified dementia without behavioral disturbance: Secondary | ICD-10-CM

## 2018-02-06 DIAGNOSIS — F329 Major depressive disorder, single episode, unspecified: Secondary | ICD-10-CM

## 2018-02-06 DIAGNOSIS — I110 Hypertensive heart disease with heart failure: Secondary | ICD-10-CM | POA: Diagnosis not present

## 2018-02-06 DIAGNOSIS — Z09 Encounter for follow-up examination after completed treatment for conditions other than malignant neoplasm: Secondary | ICD-10-CM | POA: Diagnosis not present

## 2018-02-06 DIAGNOSIS — K909 Intestinal malabsorption, unspecified: Secondary | ICD-10-CM | POA: Diagnosis not present

## 2018-02-06 DIAGNOSIS — E1169 Type 2 diabetes mellitus with other specified complication: Secondary | ICD-10-CM | POA: Diagnosis not present

## 2018-02-06 DIAGNOSIS — K9089 Other intestinal malabsorption: Secondary | ICD-10-CM

## 2018-02-06 DIAGNOSIS — D649 Anemia, unspecified: Secondary | ICD-10-CM | POA: Diagnosis not present

## 2018-02-06 DIAGNOSIS — K449 Diaphragmatic hernia without obstruction or gangrene: Secondary | ICD-10-CM | POA: Diagnosis not present

## 2018-02-06 DIAGNOSIS — I2510 Atherosclerotic heart disease of native coronary artery without angina pectoris: Secondary | ICD-10-CM | POA: Diagnosis not present

## 2018-02-06 DIAGNOSIS — K2990 Gastroduodenitis, unspecified, without bleeding: Secondary | ICD-10-CM | POA: Diagnosis not present

## 2018-02-06 DIAGNOSIS — I5032 Chronic diastolic (congestive) heart failure: Secondary | ICD-10-CM | POA: Diagnosis not present

## 2018-02-06 DIAGNOSIS — R55 Syncope and collapse: Secondary | ICD-10-CM | POA: Diagnosis not present

## 2018-02-06 DIAGNOSIS — E785 Hyperlipidemia, unspecified: Secondary | ICD-10-CM | POA: Diagnosis not present

## 2018-02-06 LAB — HEMOGLOBIN, FINGERSTICK: HEMOGLOBIN: 8 g/dL — AB (ref 12.6–17.7)

## 2018-02-06 MED ORDER — FERROUS SULFATE 325 (65 FE) MG PO TABS: 325.0000 mg | ORAL_TABLET | Freq: Three times a day (TID) | ORAL | 3 refills | Status: DC

## 2018-02-06 MED ORDER — ESCITALOPRAM OXALATE 5 MG PO TABS: 5.0000 mg | ORAL_TABLET | Freq: Every evening | ORAL | 3 refills | Status: DC

## 2018-02-06 MED ORDER — POLYETHYLENE GLYCOL 3350 17 G PO PACK: 17.0000 g | PACK | Freq: Every day | ORAL | 3 refills | Status: AC

## 2018-02-06 MED ORDER — RISPERIDONE 0.25 MG PO TABS: 0.2500 mg | ORAL_TABLET | Freq: Two times a day (BID) | ORAL | 3 refills | Status: DC

## 2018-02-06 MED ORDER — FUROSEMIDE 20 MG PO TABS: 20.0000 mg | ORAL_TABLET | Freq: Every day | ORAL | 1 refills | Status: DC | PRN

## 2018-02-06 NOTE — Progress Notes (Signed)
Subjective:    Patient ID: [name], male    DOB: 05/30/1932, 84 y.o.   MRN: [id number]  HPI Patient here today for hospital follow up from [facility] where he was seen from 01/26/18 to 01/27/18 for CVA.  The patient today comes to the visit with his daughter and caregiver.  He has had 2 falls recently and back in the hospital for these falls.  He continues to be weak he is off of the lisinopril but his blood pressure is running better.  He is having edema weakness some back and neck pain and unfortunately is having aggressive behavior with the therapist that are coming in to help him.  He also has a dog that he is trying to look after.  His last hospital visit was November 4 and 5.  An MRI said small acute CVA x2.  He continues to have falls and dizziness and dementia issues.  He has had an aortic valve replacement.  He also has a DNR.  On his last CBC his hemoglobin was 8.9.  The iron is causing severe constipation.  He is also on pantoprazole which could interfere with the absorption of the iron.  The ongoing problem is frequent falls low hemoglobin and dementia.  We did have a discussion with the patient's daughter/granddaughter and the caregiver.  He is at home during some of the day.  The patient had no comments to make.  He is very weak.  We will check a hemoglobin today and because of the problem with iron that he is having we will try to get him into see the hematologist as soon as possible and after discussing placement with the granddaughter she is going to look into getting him into an assisted living facility at least for short period of time to see if we can rehab him that way and hopefully get him to come back home after that.   Patient Active Problem List   Diagnosis Date Noted  . Dementia without behavioral disturbance ([place]) 01/27/2018  . Falls frequently 01/27/2018  . DNR (do not resuscitate) 01/27/2018  . Diabetes mellitus due to underlying condition with diabetic autonomic  neuropathy, without long-term current use of insulin ([place]) 01/21/2018  . Depression 12/26/2017  . Sepsis ([place]) 12/26/2017  . Syncope   . Syncope and collapse   . Fall   . Dyslipidemia 10/15/2017  . Leg swelling 10/15/2017  . Palpitations 08/21/2016  . Essential hypertension 08/21/2016  . Carotid bruit 08/21/2016  . H/O aortic valve replacement 08/21/2016  . Anemia, iron deficiency 04/11/2014  . Orthostatic hypotension 06/22/2012  . Dizzy 06/22/2012  . Precordial pain 09/18/2011  . Unstable angina ([place]) 07/24/2011  . CAD (coronary artery disease) 01/30/2011  . COLONIC POLYPS 06/23/2008  . Type 2 diabetes mellitus with hyperlipidemia ([place]) 06/23/2008  . Hyperlipidemia LDL goal <70 06/23/2008  . Aortic valve disorder 06/23/2008  . BENIGN PROSTATIC HYPERTROPHY, HX OF 06/23/2008   Outpatient Encounter Medications as of 02/06/2018  Medication Sig  . ACCU-CHEK SOFTCLIX LANCETS lancets Use as instructed  . aspirin 81 MG tablet Take 1 tablet (81 mg total) by mouth every morning.  . blood glucose meter kit and supplies Check BS up to four times daily.Dx E11.9  . escitalopram (LEXAPRO) 20 MG tablet Take 0.5 tablets (10 mg total) by mouth every evening.  . ferrous sulfate 325 (65 FE) MG tablet Take 1 tablet (325 mg total) by mouth daily with breakfast for 14 days.  Mar[place] Kitchen glucose blood (  ACCU-CHEK AVIVA) test strip Test BS QD and PRN  . metFORMIN (GLUCOPHAGE) 500 MG tablet Take 1 tablet (500 mg total) by mouth 2 (two) times daily.  . metoprolol succinate (TOPROL-XL) 25 MG 24 hr tablet Take 0.5 tablets (12.5 mg total) by mouth daily before breakfast.  . pantoprazole (PROTONIX) 40 MG tablet Take 1 tablet (40 mg total) by mouth 2 (two) times daily before a meal.  . polyethylene glycol (MIRALAX / GLYCOLAX) packet Take 17 g by mouth daily.  . rosuvastatin (CRESTOR) 40 MG tablet Take 1 tablet (40 mg total) by mouth every evening.  . nitroGLYCERIN (NITROSTAT) 0.4 MG SL tablet Place 1 tablet (0.4 mg  total) under the tongue every 5 (five) minutes x 3 doses as needed for chest pain. (Patient not taking: Reported on 02/06/2018)   No facility-administered encounter medications on file as of 02/06/2018.       Review of Systems  Constitutional: Negative.   HENT: Negative.   Eyes: Negative.   Respiratory: Negative.   Cardiovascular: Positive for leg swelling.  Gastrointestinal: Negative.   Endocrine: Negative.   Genitourinary: Negative.   Musculoskeletal: Positive for back pain (mid- right side ) and neck pain.  Skin: Negative.   Allergic/Immunologic: Negative.   Neurological: Positive for weakness. Tremors: general   Hematological: Negative.   Psychiatric/Behavioral: Negative.        Objective:   Physical Exam  Constitutional: He is oriented to person, place, and time. No distress.  The patient is elderly appearing and weak and somewhat depressed looking in appearance.  HENT:  Head: Normocephalic and atraumatic.  Eyes: Pupils are equal, round, and reactive to light. Conjunctivae and EOM are normal. Right eye exhibits no discharge. Left eye exhibits no discharge. No scleral icterus.  Neck: Normal range of motion. Neck supple. No thyromegaly present.  Cardiovascular: Normal rate, regular rhythm and normal heart sounds.  No murmur heard. The heart is regular at 72/min.  Pulmonary/Chest: Effort normal and breath sounds normal. He has no wheezes. He has no rales.  Lungs are clear anteriorly and posteriorly and there was no pretibial edema.  Musculoskeletal: Normal range of motion. He exhibits no edema.  Generalized weakness.  Lymphadenopathy:    He has no cervical adenopathy.  Neurological: He is alert and oriented to person, place, and time.  The patient responded appropriately to questions asked of him and did seem to understand the reasoning behind the fact that he needs more care.  Skin: Skin is warm and dry. No rash noted.  Psychiatric: Judgment and thought content normal.    Mood affect and behavior are somewhat flat.  Nursing note and vitals reviewed.  BP 112/61 (BP Location: Left Arm)   Pulse 72   Temp (!) 96.6 F (35.9 C) (Oral)   Ht '5\' 9"'  (1.753 m)   Wt 169 lb (76.7 kg)   BMI 24.96 kg/m    Fingerstick hemoglobin today was 8.0.     Assessment & Plan:  1. Hospital discharge follow-up -Patient has had 2 TIAs.  He is currently staying at home by himself with only a daytime caregiver.  There is a dog in the house.  He is weak and has trouble with standing and walking.  He remains constipated.  2. Other iron deficiency anemia -He is supposed to be on iron but has not been given any for a few days because of the constipation.  We encourage the caregiver and the daughter to give the iron more frequently and to take MiraLAX  more regularly to help the constipation. -In the meantime we will have him visit with the hematologist to see if there is any other way of getting his iron without creating all the constipation from the p.o. iron. - Ambulatory referral to Hematology - Hemoglobin, fingerstick  3. Poor iron absorption -Continue with Protonix but increase Feosol or Chromagen that he is currently taking to 3 times daily. -Use MiraLAX daily and more often if constipation gets worse. -Patient is encouraged to eat and drink more. - Ambulatory referral to Hematology - Hemoglobin, fingerstick  4. Depression, unspecified depression type -We will add Risperdal 0.25 twice daily and reduce the Lexapro to make sure there is no severe reaction to taking both of these medications. -We will plan to see him back again in a couple weeks or sooner if necessary -The daughter is to check out assisted living facilities at [place] and [place] for placement that we hope would be temporary in order to do rehab and get him eating and drinking more. -In the meantime 24-hour care may need to be arranged at home. - escitalopram (LEXAPRO) 5 MG tablet; Take 1 tablet (5 mg  total) by mouth every evening.  Dispense: 30 tablet; Refill: 3  Meds ordered this encounter  Medications  . risperiDONE (RISPERDAL) 0.25 MG tablet    Sig: Take 1 tablet (0.25 mg total) by mouth 2 (two) times daily.    Dispense:  60 tablet    Refill:  3  . escitalopram (LEXAPRO) 5 MG tablet    Sig: Take 1 tablet (5 mg total) by mouth every evening.    Dispense:  30 tablet    Refill:  3  . polyethylene glycol (MIRALAX / GLYCOLAX) packet    Sig: Take 17 g by mouth daily.    Dispense:  30 each    Refill:  3  . ferrous sulfate 325 (65 FE) MG tablet    Sig: Take 1 tablet (325 mg total) by mouth 3 (three) times daily with meals.    Dispense:  90 tablet    Refill:  3  . furosemide (LASIX) 20 MG tablet    Sig: Take 1 tablet (20 mg total) by mouth daily as needed.    Dispense:  30 tablet    Refill:  1    Please send bottle for PRN use - do not pre-pack   Patient Instructions  Patient will be referred to hematology because of inability to take iron and keep his hemoglobin high enough to make him feel well without having syncope. In the meantime we will ask him to take iron 3 times daily We will ask him to continue to take the Protonix 1 daily before breakfast We will ask him and encourage him to eat more and drink more fluids He should take MiraLAX at least once daily to help with constipation from the iron He can additionally take Colace as a stool softener He needs to drink plenty of fluids and stay well-hydrated The daughter will check out assisted living facilities at [place] and [place] for short-term stay so that physical therapy can be arranged and observation can be done and he can be helped more until he has more strength. We will also start him on Risperdal because of his agitation and have him take the very lowest dose at 0.25 twice daily We will reduce his Lexapro and take this only 1/2 pill daily. The Lasix will be given only if needed. We would ask him to come  back to  see [place] in a couple of weeks.  Arrie Senate MD

## 2018-02-06 NOTE — Patient Instructions (Signed)
Patient will be referred to hematology because of inability to take iron and keep his hemoglobin high enough to make him feel well without having syncope. In the meantime we will ask him to take iron 3 times daily We will ask him to continue to take the Protonix 1 daily before breakfast We will ask him and encourage him to eat more and drink more fluids He should take MiraLAX at least once daily to help with constipation from the iron He can additionally take Colace as a stool softener He needs to drink plenty of fluids and stay well-hydrated The daughter will check out assisted living facilities at [place] and [place] for short-term stay so that physical therapy can be arranged and observation can be done and he can be helped more until he has more strength. We will also start him on Risperdal because of his agitation and have him take the very lowest dose at 0.25 twice daily We will reduce his Lexapro and take this only 1/2 pill daily. The Lasix will be given only if needed. We would ask him to come back to see [place] in a couple of weeks.

## 2018-02-08 NOTE — Progress Notes (Signed)
HPI The patient presents for followup of his aortic stenosis s/p aortic valve replacement.    Since I last saw him he was in the hospital with a CVA.  I reviewed these records for this visit. I see a mention of the MRI changes that were found to be possibly incidental.  He did have an echo in the hospital with a normal EF with a normal [facility] pericardial valve.    He comes today with his caregiver.  He has 24-hour assistance at home.  He denies any cardiovascular symptoms of palpitations or shortness of breath or chest discomfort.  However, he looks much more frail than he did previously.  His gait and level of weakness is markedly different.  Per his caregiver there is progressive dementia.  He has had some falls.  She mentions syncope but on careful questioning these appear to be falls.  He is had lower blood pressure and I note that his metoprolol has been decreased and his lisinopril stopped completely.  Is not any new chest pressure, neck or arm discomfort.  There is not any report of new shortness of breath, PND or orthopnea.  He has had a progressive weight loss.  His appetite is decreased.   No Known Allergies  Current Outpatient Medications  Medication Sig Dispense Refill  . ACCU-CHEK SOFTCLIX LANCETS lancets Use as instructed 100 each 12  . aspirin 81 MG tablet Take 1 tablet (81 mg total) by mouth every morning. 30 tablet 11  . blood glucose meter kit and supplies Check BS up to four times daily.Dx E11.9 1 each 1  . escitalopram (LEXAPRO) 20 MG tablet Take 20 mg by mouth daily.    . ferrous sulfate 325 (65 FE) MG tablet Take 1 tablet (325 mg total) by mouth 3 (three) times daily with meals. 90 tablet 3  . furosemide (LASIX) 20 MG tablet Take 1 tablet (20 mg total) by mouth daily as needed. 30 tablet 1  . glucose blood (ACCU-CHEK AVIVA) test strip Test BS QD and PRN 100 each 11  . metFORMIN (GLUCOPHAGE) 500 MG tablet Take 1 tablet (500 mg total) by mouth 2 (two) times daily.  60 tablet 11  . metoprolol succinate (TOPROL-XL) 25 MG 24 hr tablet Take 0.5 tablets (12.5 mg total) by mouth daily before breakfast. 45 tablet 3  . nitroGLYCERIN (NITROSTAT) 0.4 MG SL tablet Place 1 tablet (0.4 mg total) under the tongue every 5 (five) minutes x 3 doses as needed for chest pain. 25 tablet prn  . pantoprazole (PROTONIX) 40 MG tablet Take 1 tablet (40 mg total) by mouth 2 (two) times daily before a meal. 60 tablet 0  . polyethylene glycol (MIRALAX / GLYCOLAX) packet Take 17 g by mouth daily. 30 each 3  . risperiDONE (RISPERDAL) 0.25 MG tablet Take 1 tablet (0.25 mg total) by mouth 2 (two) times daily. 60 tablet 3  . rosuvastatin (CRESTOR) 40 MG tablet Take 1 tablet (40 mg total) by mouth every evening. 30 tablet 11   No current facility-administered medications for this visit.     Past Medical History:  Diagnosis Date  . Anemia    Iron deficiency  . Aortic stenosis    s/p pericardial AVR  . Aortic valve disorder 06/23/2008   Qualifier: Diagnosis of  By: Mare Ferrari, RMA, Sherri    . Arthritis   . Benign prostatic hypertrophy   . CAD (coronary artery disease) 01/30/2011  . Carotid bruit 08/21/2016  . COLONIC POLYPS  06/23/2008   Qualifier: Diagnosis of  By: Mare Ferrari, RMA, Sherri    . Coronary artery disease    s/p CABG 08/2010    . Diabetes mellitus   . Dyslipidemia 10/15/2017  . Essential hypertension 08/21/2016  . H/O aortic valve replacement 08/21/2016  . Hyperlipidemia   . Hyperlipidemia LDL goal <70 06/23/2008   Qualifier: Diagnosis of  By: Mare Ferrari, RMA, Sherri    . Leg swelling 10/15/2017  . Orthostatic hypotension 06/22/2012  . Palpitations 08/21/2016  . Precordial pain 09/18/2011  . Type 2 diabetes mellitus with hyperlipidemia ([place]) 06/23/2008   Qualifier: Diagnosis of  By: Mare Ferrari, RMA, Sherri    . Unstable angina ([place]) 07/24/2011    Past Surgical History:  Procedure Laterality Date  . AORTIC VALVE REPLACEMENT  08/24/10   95m pericardial tissue valve  . COLONOSCOPY WITH  PROPOFOL N/A 12/28/2017   Procedure: COLONOSCOPY WITH PROPOFOL;  Surgeon: RRogene Houston MD;  [facility]: [facility];  Service: Endoscopy;  Laterality: N/A;  . CORONARY ARTERY BYPASS GRAFT  08/24/10   LIMA to LAD, SVG to ramus intermediate, SVG to diagonal  . ESOPHAGOGASTRODUODENOSCOPY (EGD) WITH PROPOFOL N/A 12/28/2017   Procedure: ESOPHAGOGASTRODUODENOSCOPY (EGD) WITH PROPOFOL;  Surgeon: RRogene Houston MD;  Location: [facility];  Service: Endoscopy;  Laterality: N/A;  . HERNIA REPAIR    . knee replacements     x 2  . POLYPECTOMY  12/28/2017   Procedure: POLYPECTOMY;  Surgeon: RRogene Houston MD;  Location: [facility];  Service: Endoscopy;;  transverse colon     ROS:  As stated in the HPI and negative for all other systems.  PHYSICAL EXAM BP 103/60   Pulse 96   Ht _0  (1.702 m)   Wt 165 lb 12.8 oz (75.2 kg)   SpO2 97%   BMI 25.97 kg/m   GENERAL:  Well appearing NECK:  No jugular venous distention, waveform within normal limits, carotid upstroke brisk and symmetric, no bruits, no thyromegaly LUNGS:  Clear to auscultation bilaterally CHEST:  Well healed sternotomy scar. HEART:  PMI not displaced or sustained,S1 and S2 within normal limits, no S3, no S4, no clicks, no rubs, no murmurs ABD:  Flat, positive bowel sounds normal in frequency in pitch, no bruits, no rebound, no guarding, no midline pulsatile mass, no hepatomegaly, no splenomegaly EXT:  2 plus pulses throughout, no edema, no cyanosis no clubbing NEURO: Generalized weakness nonfocal, gait disturbance   EKG: NA  Lab Results  Component Value Date   CHOL 198 08/07/2017   TRIG 89 08/07/2017   HDL 56 08/07/2017   LDLCALC 124 (H) 08/07/2017   Lab Results  Component Value Date   TSH 3.500 08/07/2017    ASSESSMENT AND PLAN  POSSIBLE CVA:   There was a suggestion that he have an event recorder placed.  This was sent to the house but his caregiver does not know how to use it.  We instructed her to call the  1 800 number.  I reviewed these results when available.  I do note from his hospitalization that carotid Doppler demonstrated only mild disease.  PALPITATIONS:    He is not having any symptoms.  No change in therapy is indicated.  BRUIT:    As above.  CAD:   He is not having any anginal symptoms.  No change in therapy.  AS s/p AVR:    He had stable valve as reported above.  No further imaging.  HTN:   Blood pressure has  been declining.  He is tolerating current medications.  No change in therapy.  DYSLIPIDEMIA:    No change in therapy.  He should have follow up Lipids. I will defer to Chipper Herb, MD  DM:     A1C was 6.5.  No change in therapy.   DEMENTIA:  I sent a note to Dr. Laurance Flatten inquiring about possible neuro evaluation.  He has had a significantly progressive decline since I last saw him.  DNR

## 2018-02-09 ENCOUNTER — Ambulatory Visit (HOSPITAL_COMMUNITY): Payer: Medicare HMO | Admitting: Hematology

## 2018-02-10 DIAGNOSIS — D649 Anemia, unspecified: Secondary | ICD-10-CM | POA: Diagnosis not present

## 2018-02-10 DIAGNOSIS — K2990 Gastroduodenitis, unspecified, without bleeding: Secondary | ICD-10-CM | POA: Diagnosis not present

## 2018-02-10 DIAGNOSIS — K449 Diaphragmatic hernia without obstruction or gangrene: Secondary | ICD-10-CM | POA: Diagnosis not present

## 2018-02-10 DIAGNOSIS — E785 Hyperlipidemia, unspecified: Secondary | ICD-10-CM | POA: Diagnosis not present

## 2018-02-10 DIAGNOSIS — E1169 Type 2 diabetes mellitus with other specified complication: Secondary | ICD-10-CM | POA: Diagnosis not present

## 2018-02-10 DIAGNOSIS — I2510 Atherosclerotic heart disease of native coronary artery without angina pectoris: Secondary | ICD-10-CM | POA: Diagnosis not present

## 2018-02-10 DIAGNOSIS — I110 Hypertensive heart disease with heart failure: Secondary | ICD-10-CM | POA: Diagnosis not present

## 2018-02-10 DIAGNOSIS — R55 Syncope and collapse: Secondary | ICD-10-CM | POA: Diagnosis not present

## 2018-02-10 DIAGNOSIS — I5032 Chronic diastolic (congestive) heart failure: Secondary | ICD-10-CM | POA: Diagnosis not present

## 2018-02-11 ENCOUNTER — Encounter: Payer: Self-pay | Admitting: Cardiology

## 2018-02-11 ENCOUNTER — Ambulatory Visit: Payer: Medicare HMO | Admitting: Cardiology

## 2018-02-11 ENCOUNTER — Other Ambulatory Visit: Payer: Self-pay | Admitting: *Deleted

## 2018-02-11 VITALS — BP 103/60 | HR 96 | Ht 67.0 in | Wt 165.8 lb

## 2018-02-11 DIAGNOSIS — R451 Restlessness and agitation: Secondary | ICD-10-CM

## 2018-02-11 DIAGNOSIS — I2510 Atherosclerotic heart disease of native coronary artery without angina pectoris: Secondary | ICD-10-CM

## 2018-02-11 DIAGNOSIS — D649 Anemia, unspecified: Secondary | ICD-10-CM | POA: Diagnosis not present

## 2018-02-11 DIAGNOSIS — R410 Disorientation, unspecified: Secondary | ICD-10-CM

## 2018-02-11 DIAGNOSIS — Z952 Presence of prosthetic heart valve: Secondary | ICD-10-CM

## 2018-02-11 DIAGNOSIS — I639 Cerebral infarction, unspecified: Secondary | ICD-10-CM

## 2018-02-11 DIAGNOSIS — F0390 Unspecified dementia without behavioral disturbance: Secondary | ICD-10-CM

## 2018-02-11 DIAGNOSIS — E1169 Type 2 diabetes mellitus with other specified complication: Secondary | ICD-10-CM | POA: Diagnosis not present

## 2018-02-11 DIAGNOSIS — I5032 Chronic diastolic (congestive) heart failure: Secondary | ICD-10-CM | POA: Diagnosis not present

## 2018-02-11 DIAGNOSIS — I110 Hypertensive heart disease with heart failure: Secondary | ICD-10-CM | POA: Diagnosis not present

## 2018-02-11 DIAGNOSIS — I10 Essential (primary) hypertension: Secondary | ICD-10-CM

## 2018-02-11 DIAGNOSIS — K449 Diaphragmatic hernia without obstruction or gangrene: Secondary | ICD-10-CM | POA: Diagnosis not present

## 2018-02-11 DIAGNOSIS — R55 Syncope and collapse: Secondary | ICD-10-CM | POA: Diagnosis not present

## 2018-02-11 DIAGNOSIS — R531 Weakness: Secondary | ICD-10-CM

## 2018-02-11 DIAGNOSIS — R296 Repeated falls: Secondary | ICD-10-CM

## 2018-02-11 DIAGNOSIS — K2990 Gastroduodenitis, unspecified, without bleeding: Secondary | ICD-10-CM | POA: Diagnosis not present

## 2018-02-11 DIAGNOSIS — E785 Hyperlipidemia, unspecified: Secondary | ICD-10-CM | POA: Diagnosis not present

## 2018-02-11 NOTE — Progress Notes (Signed)
Please schedule this visit if not already done to have a neurological evaluation on this patient.  Dr. Percival Spanish feels this may also be beneficial also.  Also I think a hematology visit was in the works.  Please check on this.

## 2018-02-11 NOTE — Patient Instructions (Signed)
Medication Instructions:  The current medical regimen is effective;  continue present plan and medications.  If you need a refill on your cardiac medications before your next appointment, please call your pharmacy.   Follow-Up: Follow up in 2 months with Dr Hochrein in [place].  Thank you for choosing [facility]!!     

## 2018-02-11 NOTE — Progress Notes (Signed)
Pt daughter aware - referral for neuro placed

## 2018-02-12 ENCOUNTER — Other Ambulatory Visit: Payer: Self-pay | Admitting: *Deleted

## 2018-02-12 ENCOUNTER — Inpatient Hospital Stay (HOSPITAL_COMMUNITY): Payer: Medicare HMO | Attending: Hematology | Admitting: Hematology

## 2018-02-12 ENCOUNTER — Inpatient Hospital Stay (HOSPITAL_COMMUNITY): Payer: Medicare HMO

## 2018-02-12 ENCOUNTER — Encounter (HOSPITAL_COMMUNITY): Payer: Self-pay | Admitting: Hematology

## 2018-02-12 ENCOUNTER — Other Ambulatory Visit: Payer: Self-pay

## 2018-02-12 ENCOUNTER — Telehealth: Payer: Self-pay | Admitting: Family Medicine

## 2018-02-12 VITALS — BP 108/61 | HR 92 | Temp 97.9°F | Resp 18 | Wt 170.1 lb

## 2018-02-12 DIAGNOSIS — Z7984 Long term (current) use of oral hypoglycemic drugs: Secondary | ICD-10-CM | POA: Diagnosis not present

## 2018-02-12 DIAGNOSIS — Z79899 Other long term (current) drug therapy: Secondary | ICD-10-CM | POA: Diagnosis not present

## 2018-02-12 DIAGNOSIS — K5900 Constipation, unspecified: Secondary | ICD-10-CM | POA: Diagnosis not present

## 2018-02-12 DIAGNOSIS — Z951 Presence of aortocoronary bypass graft: Secondary | ICD-10-CM | POA: Diagnosis not present

## 2018-02-12 DIAGNOSIS — N400 Enlarged prostate without lower urinary tract symptoms: Secondary | ICD-10-CM | POA: Diagnosis not present

## 2018-02-12 DIAGNOSIS — K644 Residual hemorrhoidal skin tags: Secondary | ICD-10-CM

## 2018-02-12 DIAGNOSIS — I2510 Atherosclerotic heart disease of native coronary artery without angina pectoris: Secondary | ICD-10-CM | POA: Insufficient documentation

## 2018-02-12 DIAGNOSIS — E785 Hyperlipidemia, unspecified: Secondary | ICD-10-CM | POA: Diagnosis not present

## 2018-02-12 DIAGNOSIS — D509 Iron deficiency anemia, unspecified: Secondary | ICD-10-CM | POA: Insufficient documentation

## 2018-02-12 DIAGNOSIS — E1136 Type 2 diabetes mellitus with diabetic cataract: Secondary | ICD-10-CM | POA: Diagnosis not present

## 2018-02-12 DIAGNOSIS — Z952 Presence of prosthetic heart valve: Secondary | ICD-10-CM | POA: Insufficient documentation

## 2018-02-12 DIAGNOSIS — M1990 Unspecified osteoarthritis, unspecified site: Secondary | ICD-10-CM | POA: Diagnosis not present

## 2018-02-12 DIAGNOSIS — K5730 Diverticulosis of large intestine without perforation or abscess without bleeding: Secondary | ICD-10-CM | POA: Insufficient documentation

## 2018-02-12 LAB — CBC WITH DIFFERENTIAL/PLATELET
Abs Immature Granulocytes: 0.02 10*3/uL (ref 0.00–0.07)
BASOS PCT: 0 %
Basophils Absolute: 0 10*3/uL (ref 0.0–0.1)
EOS ABS: 0.3 10*3/uL (ref 0.0–0.5)
Eosinophils Relative: 5 %
HCT: 29.4 % — ABNORMAL LOW (ref 39.0–52.0)
Hemoglobin: 8.6 g/dL — ABNORMAL LOW (ref 13.0–17.0)
Immature Granulocytes: 0 %
Lymphocytes Relative: 20 %
Lymphs Abs: 1.4 10*3/uL (ref 0.7–4.0)
MCH: 23.9 pg — AB (ref 26.0–34.0)
MCHC: 29.3 g/dL — ABNORMAL LOW (ref 30.0–36.0)
MCV: 81.7 fL (ref 80.0–100.0)
MONO ABS: 0.7 10*3/uL (ref 0.1–1.0)
MONOS PCT: 10 %
Neutro Abs: 4.7 10*3/uL (ref 1.7–7.7)
Neutrophils Relative %: 65 %
PLATELETS: 214 10*3/uL (ref 150–400)
RBC: 3.6 MIL/uL — AB (ref 4.22–5.81)
RDW: 20.6 % — AB (ref 11.5–15.5)
WBC: 7.2 10*3/uL (ref 4.0–10.5)
nRBC: 0 % (ref 0.0–0.2)

## 2018-02-12 LAB — COMPREHENSIVE METABOLIC PANEL
ALT: 26 U/L (ref 0–44)
AST: 37 U/L (ref 15–41)
Albumin: 3.3 g/dL — ABNORMAL LOW (ref 3.5–5.0)
Alkaline Phosphatase: 90 U/L (ref 38–126)
Anion gap: 6 (ref 5–15)
BUN: 17 mg/dL (ref 8–23)
CHLORIDE: 105 mmol/L (ref 98–111)
CO2: 26 mmol/L (ref 22–32)
Calcium: 8.7 mg/dL — ABNORMAL LOW (ref 8.9–10.3)
Creatinine, Ser: 1.2 mg/dL (ref 0.61–1.24)
GFR, EST NON AFRICAN AMERICAN: 54 mL/min — AB (ref >60)
Glucose, Bld: 106 mg/dL — ABNORMAL HIGH (ref 70–99)
POTASSIUM: 4.5 mmol/L (ref 3.5–5.1)
SODIUM: 137 mmol/L (ref 135–145)
Total Bilirubin: 0.8 mg/dL (ref 0.3–1.2)
Total Protein: 6.8 g/dL (ref 6.5–8.1)

## 2018-02-12 LAB — IRON AND TIBC
IRON: 34 ug/dL — AB (ref 45–182)
SATURATION RATIOS: 10 % — AB (ref 17.9–39.5)
TIBC: 324 ug/dL (ref 250–450)
UIBC: 290 ug/dL

## 2018-02-12 LAB — RETICULOCYTES
Immature Retic Fract: 13.4 % (ref 2.3–15.9)
RBC.: 3.6 MIL/uL — ABNORMAL LOW (ref 4.22–5.81)
Retic Count, Absolute: 50.8 10*3/uL (ref 19.0–186.0)
Retic Ct Pct: 1.4 % (ref 0.4–3.1)

## 2018-02-12 LAB — FERRITIN: Ferritin: 29 ng/mL (ref 24–336)

## 2018-02-12 LAB — LACTATE DEHYDROGENASE: LDH: 176 U/L (ref 98–192)

## 2018-02-12 LAB — FOLATE: Folate: 15.8 ng/mL (ref >5.9)

## 2018-02-12 LAB — VITAMIN B12: Vitamin B-12: 425 pg/mL (ref 180–914)

## 2018-02-12 NOTE — Assessment & Plan Note (Addendum)
1.  Microcytic anemia: - He was admitted to the hospital from 12/25/2017 through 12/28/2017 and received 1 unit of PRBC.  Ferritin was 15 and percent saturation was 23.  Creatinine was elevated at 1.7. -he was readmitted for a fall on 01/26/2018 and had to receive 1 more unit of PRBC for hemoglobin of 8.1.  MCV was 76.8. - He was reportedly on iron tablets 3 times a day for the last 2 months and had severe constipation episodes. - I have reviewed his labs.  He has been anemic on and off since 2012.  He has been microcytic since May 2019.  He had elevated creatinine since October 2019. -His colonoscopy on 12/28/2017 shows 5 mm polyp in the mid transverse colon, diverticulosis of the sigmoid colon, external hemorrhoids. -EGD on 12/28/2017 shows normal esophagus, 6 cm hiatal hernia, erosive gastropathy, duodenal erosion without bleeding. -he is currently on baby aspirin daily as he had couple of strokes on MRI on most recent admission. - He lives at home by himself, with caregiver around-the-clock. - We will repeat his CBC today.  We will check him for nutritional deficiencies including ferritin, iron panel, R17, copper and folic acid.  We will also check an SPEP to rule out plasma cell disorder. - I have talked to him about starting him on Feraheme weekly x2.  We talked about the side effects in detail including but not limited to anaphylactic reactions.  I have told him to discontinue oral iron.  We will schedule him for an infusion early next week.  I plan to repeat his labs in 6 weeks.

## 2018-02-12 NOTE — Patient Outreach (Signed)
[facility] Care Management  02/12/2018  [name] 1932/08/18 [id number]   Case conference  [facility] RN CM participated in the [facility] multidisciplinary discussion with [facility] SW for Mr Scholle Recommendations was to continue working with [facility] SW for home services, Speak with primary MD about treatment plan for falls and health care needs.   [facility] RN CM called to speak with his daughter, [name] to discuss the multidisciplinary meeting recommendations and to see if there are other needs   [name] confirms she found someone to stay at night with Mr Baba and has updated Antonietta Breach at [facility] but can not recall if she updated [facility] SW. [facility]  RN CM will update [facility] SW  [facility] RN CM reviewed safety at home for Mr Maler and [name] states that Mr Pasha should be better now with the new sitter at night, Gwinda Passe the sitter during the day and bath room safety DME. She and CM discussed the falls and she reports the last ones related to getting up at night and "wondering around" CM discussed anemia concerns may also cause some the fall. Mr Spratlin is meeting again today with his oncologist to evaluate his anemia concerns CM discussed updating Dr Laurance Flatten and inquired if home health PT may be beneficial but [name] reports at this time [facility] PT is not needed as Mr Schroader continues to use bars throughout the home and bathroom, use his walker and wear non slip socks   [facility] RN CM called the primary care MD office, Dr Alphonse Guild, and spoke with Loma Sousa.  [facility] RN CM left a message for Dr Laurance Flatten about [facility] multidisciplinary recommendations and a possible treatment plan for home safety for Mr Osaze, Hubbert also reports a call from Mr Puccinelli daughter who also left a message for Dr Laurance Flatten today also    [facility] RN CM will continue to collaborate with Dr Laurance Flatten, [facility] SW, Mr Dubie and his daughter prn  [facility] RN CM updated [facility] SW, Museum/gallery conservator of Mr Kallen having night sitter services  Routed note to primary MD and oncologist   Joelene Millin L. Lavina Hamman,  RN, BSN, [place] Coordinator Office number [phone] Mobile number [phone]  Main [facility] number [phone] Fax number [phone]

## 2018-02-12 NOTE — Progress Notes (Signed)
 CONSULT NOTE  Patient Care Team: Moore, Donald W, MD as PCP - General (Family Medicine) Hochrein, James, MD as PCP - Cardiology (Cardiology) Hendrickson, Steven C, MD (Cardiothoracic Surgery) Chrismon, Amber as [facility] Care Management  CHIEF COMPLAINTS/PURPOSE OF CONSULTATION:  Microcytic anemia.  HISTORY OF PRESENTING ILLNESS:  [name] 85 y.o. male is seen in consultation today for further work-up and management of microcytic anemia.  He had 2 admissions in October and November of this year with anemia and had to receive 2 units of blood transfusion in total.  He denies any prior blood transfusions.  He denies any pica and eats a variety of diet.  He denies any prior history of cancer.  Last colonoscopy was on 12/28/2017 which did not identify any bleeding source.  It showed diverticulosis in the sigmoid colon, external hemorrhoids and 5 mm polyp in the mid transverse colon.  EGD on the same day showed normal esophagus with 6 cm hiatal hernia, erosive gastropathy and duodenal erosion without bleeding.  He denies any chest pains, or lightheadedness.  Denies any family history of malignancies.  He reportedly has been taking iron tablet 3 times a day for the last 2 months.  He is having severe constipation from it.  Denies any fevers, night sweats or weight loss.  He was never smoker.  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    MEDICAL HISTORY:  Past Medical History:  Diagnosis Date  . Anemia    Iron deficiency  . Aortic stenosis    s/p pericardial AVR  . Aortic valve disorder 06/23/2008   Qualifier: Diagnosis of  By: Frazier, RMA, Sherri    . Arthritis   . Benign prostatic hypertrophy   . CAD (coronary artery disease) 01/30/2011  . Carotid bruit 08/21/2016  . COLONIC POLYPS 06/23/2008   Qualifier: Diagnosis of  By: Frazier, RMA, Sherri    . Coronary artery disease    s/p CABG 08/2010    . Diabetes mellitus   .  Dyslipidemia 10/15/2017  . Essential hypertension 08/21/2016  . H/O aortic valve replacement 08/21/2016  . Hyperlipidemia   . Hyperlipidemia LDL goal <70 06/23/2008   Qualifier: Diagnosis of  By: Frazier, RMA, Sherri    . Leg swelling 10/15/2017  . Orthostatic hypotension 06/22/2012  . Palpitations 08/21/2016  . Precordial pain 09/18/2011  . Type 2 diabetes mellitus with hyperlipidemia (HCC) 06/23/2008   Qualifier: Diagnosis of  By: Frazier, RMA, Sherri    . Unstable angina (HCC) 07/24/2011    SURGICAL HISTORY: Past Surgical History:  Procedure Laterality Date  . AORTIC VALVE REPLACEMENT  08/24/10   23mm pericardial tissue valve  . COLONOSCOPY WITH PROPOFOL N/A 12/28/2017   Procedure: COLONOSCOPY WITH PROPOFOL;  Surgeon: Rehman, Najeeb U, MD;  [facility];  Service: Endoscopy;  Laterality: N/A;  . CORONARY ARTERY BYPASS GRAFT  08/24/10   LIMA to LAD, SVG to ramus intermediate, SVG to diagonal  . ESOPHAGOGASTRODUODENOSCOPY (EGD) WITH PROPOFOL N/A 12/28/2017   Procedure: ESOPHAGOGASTRODUODENOSCOPY (EGD) WITH PROPOFOL;  Surgeon: Rehman, Najeeb U, MD;  [facility];  Service: Endoscopy;  Laterality: N/A;  . HERNIA REPAIR    . knee replacements     x 2  . POLYPECTOMY  12/28/2017   Procedure: POLYPECTOMY;  Surgeon: Rehman, Najeeb U, MD;  [facility];  Service: Endoscopy;;  transverse colon     SOCIAL HISTORY: Social History   Socioeconomic   History  . Marital status: Widowed    Spouse name: Not on file  . Number of children: 4  . Years of education: 11  . Highest education level: 11th grade  Occupational History  . Occupation: Retired  Social Needs  . Financial resource strain: Not on file  . Food insecurity:    Worry: Not on file    Inability: Not on file  . Transportation needs:    Medical: Not on file    Non-medical: Not on file  Tobacco Use  . Smoking status: Former Smoker    Types: Cigars    Last attempt to quit: 09/17/1993    Years since quitting:  24.4  . Smokeless tobacco: Never Used  Substance and Sexual Activity  . Alcohol use: Yes    Comment: occassional  . Drug use: No  . Sexual activity: Yes  Lifestyle  . Physical activity:    Days per week: Not on file    Minutes per session: Not on file  . Stress: Not on file  Relationships  . Social connections:    Talks on phone: Not on file    Gets together: Not on file    Attends religious service: Not on file    Active member of club or organization: Not on file    Attends meetings of clubs or organizations: Not on file    Relationship status: Not on file  . Intimate partner violence:    Fear of current or ex partner: Not on file    Emotionally abused: Not on file    Physically abused: Not on file    Forced sexual activity: Not on file  Other Topics Concern  . Not on file  Social History Narrative  . Not on file    FAMILY HISTORY: Family History  Problem Relation Age of Onset  . Cancer Mother   . Diabetes Father   . Hypertension Other   . Diabetes Other     ALLERGIES:  has No Known Allergies.  MEDICATIONS:  Current Outpatient Medications  Medication Sig Dispense Refill  . ACCU-CHEK SOFTCLIX LANCETS lancets Use as instructed 100 each 12  . aspirin 81 MG tablet Take 1 tablet (81 mg total) by mouth every morning. 30 tablet 11  . blood glucose meter kit and supplies Check BS up to four times daily.Dx E11.9 1 each 1  . escitalopram (LEXAPRO) 20 MG tablet Take 20 mg by mouth daily.    . ferrous sulfate 325 (65 FE) MG tablet Take 1 tablet (325 mg total) by mouth 3 (three) times daily with meals. 90 tablet 3  . furosemide (LASIX) 20 MG tablet Take 1 tablet (20 mg total) by mouth daily as needed. 30 tablet 1  . glucose blood (ACCU-CHEK AVIVA) test strip Test BS QD and PRN 100 each 11  . metFORMIN (GLUCOPHAGE) 500 MG tablet Take 1 tablet (500 mg total) by mouth 2 (two) times daily. 60 tablet 11  . metoprolol succinate (TOPROL-XL) 25 MG 24 hr tablet Take 0.5 tablets (12.5  mg total) by mouth daily before breakfast. 45 tablet 3  . nitroGLYCERIN (NITROSTAT) 0.4 MG SL tablet Place 1 tablet (0.4 mg total) under the tongue every 5 (five) minutes x 3 doses as needed for chest pain. 25 tablet prn  . pantoprazole (PROTONIX) 40 MG tablet Take 1 tablet (40 mg total) by mouth 2 (two) times daily before a meal. 60 tablet 0  . polyethylene glycol (MIRALAX / GLYCOLAX) packet Take 17 g by mouth   daily. 30 each 3  . risperiDONE (RISPERDAL) 0.25 MG tablet Take 1 tablet (0.25 mg total) by mouth 2 (two) times daily. 60 tablet 3  . rosuvastatin (CRESTOR) 40 MG tablet Take 1 tablet (40 mg total) by mouth every evening. 30 tablet 11   No current facility-administered medications for this visit.     REVIEW OF SYSTEMS:   Constitutional: Denies fevers, chills or abnormal night sweats Eyes: Denies blurriness of vision, double vision or watery eyes Ears, nose, mouth, throat, and face: Denies mucositis or sore throat Respiratory: Denies cough, dyspnea or wheezes Cardiovascular: Denies palpitation, chest discomfort or lower extremity swelling Gastrointestinal: Constipation present.  Denies any nausea vomiting or diarrhea.  Skin: Denies abnormal skin rashes Lymphatics: Denies new lymphadenopathy or easy bruising Neurological:Denies numbness, tingling or new weaknesses Behavioral/Psych: Mood is stable, no new changes  All other systems were reviewed with the patient and are negative.  PHYSICAL EXAMINATION: ECOG PERFORMANCE STATUS: 2 - Symptomatic, <50% confined to bed  Vitals:   02/12/18 1311  BP: 108/61  Pulse: 92  Resp: 18  Temp: 97.9 F (36.6 C)  SpO2: 100%   Filed Weights   02/12/18 1311  Weight: 170 lb 1.6 oz (77.2 kg)    GENERAL:alert, no distress and comfortable SKIN: skin color, texture, turgor are normal, no rashes or significant lesions EYES: normal, conjunctiva are pink and non-injected, sclera clear OROPHARYNX:no exudate, no erythema and lips, buccal mucosa, and  tongue normal  NECK: supple, thyroid normal size, non-tender, without nodularity LYMPH:  no palpable lymphadenopathy in the cervical, axillary or inguinal LUNGS: clear to auscultation and percussion with normal breathing effort HEART: regular rate & rhythm and no murmurs.  1+ edema bilaterally. ABDOMEN:abdomen soft, non-tender and normal bowel sounds Musculoskeletal:no cyanosis of digits and no clubbing  PSYCH: alert & oriented x 3 with fluent speech NEURO: no focal motor/sensory deficits  LABORATORY DATA:  I have reviewed the data as listed Recent Results (from the past 2160 hour(s))  CBG monitoring, ED     Status: Abnormal   Collection Time: 12/19/17 12:45 PM  Result Value Ref Range   Glucose-Capillary 138 (H) 70 - 99 mg/dL  Basic metabolic panel     Status: Abnormal   Collection Time: 12/19/17 12:58 PM  Result Value Ref Range   Sodium 134 (L) 135 - 145 mmol/L   Potassium 4.2 3.5 - 5.1 mmol/L   Chloride 102 98 - 111 mmol/L   CO2 24 22 - 32 mmol/L   Glucose, Bld 130 (H) 70 - 99 mg/dL   BUN 14 8 - 23 mg/dL   Creatinine, Ser 1.05 0.61 - 1.24 mg/dL   Calcium 8.7 (L) 8.9 - 10.3 mg/dL   GFR calc non Af Amer >60 >60 mL/min   GFR calc Af Amer >60 >60 mL/min    Comment: (NOTE) The eGFR has been calculated using the CKD EPI equation. This calculation has not been validated in all clinical situations. eGFR's persistently <60 mL/min signify possible Chronic Kidney Disease.    Anion gap 8 5 - 15    Comment: Performed at [facility], [address] [place], [place] [address]  CBC     Status: Abnormal   Collection Time: 12/19/17 12:58 PM  Result Value Ref Range   WBC 10.0 4.0 - 10.5 K/uL   RBC 3.38 (L) 4.22 - 5.81 MIL/uL   Hemoglobin 7.9 (L) 13.0 - 17.0 g/dL   HCT 26.2 (L) 39.0 - 52.0 %   MCV 77.5 (L) 78.0 -   100.0 fL   MCH 23.4 (L) 26.0 - 34.0 pg   MCHC 30.2 30.0 - 36.0 g/dL   RDW 15.3 11.5 - 15.5 %   Platelets 284 150 - 400 K/uL    Comment: Performed at [facility], [address] [place], [place] [address]  Hepatic function panel     Status: Abnormal   Collection Time: 12/19/17  1:50 PM  Result Value Ref Range   Total Protein 7.3 6.5 - 8.1 g/dL   Albumin 3.2 (L) 3.5 - 5.0 g/dL   AST 37 15 - 41 U/L   ALT 23 0 - 44 U/L   Alkaline Phosphatase 93 38 - 126 U/L   Total Bilirubin 1.0 0.3 - 1.2 mg/dL   Bilirubin, Direct 0.3 (H) 0.0 - 0.2 mg/dL   Indirect Bilirubin 0.7 0.3 - 0.9 mg/dL    Comment: Performed at [facility], [address] [place], [place] [address]  Lipase, blood     Status: None   Collection Time: 12/19/17  1:50 PM  Result Value Ref Range   Lipase 37 11 - 51 U/L    Comment: Performed at [facility], [address] [place], [place] [address]  Brain natriuretic peptide     Status: None   Collection Time: 12/19/17  1:50 PM  Result Value Ref Range   B Natriuretic Peptide 78.0 0.0 - 100.0 pg/mL    Comment: Performed at [facility], [address] [place], [place] [address]  Troponin I     Status: None   Collection Time: 12/19/17  1:50 PM  Result Value Ref Range   Troponin I <0.03 <0.03 ng/mL    Comment: Performed at [facility], [address] [place], [place] [address]  CK     Status: None   Collection Time: 12/19/17  1:50 PM  Result Value Ref Range   Total CK 92 49 - 397 U/L    Comment: Performed at [facility], [address] [place], [place] [address]  Type and screen [facility]     Status: None   Collection Time: 12/19/17  2:07 PM  Result Value Ref Range   ABO/RH(D) O POS    Antibody Screen NEG    Sample Expiration      12/22/2017 Performed at [facility], [address] [place], [place] [address]   Urinalysis, Routine w reflex microscopic     Status: None   Collection Time: 12/19/17  2:08 PM  Result Value Ref Range   Color, Urine YELLOW YELLOW   APPearance CLEAR CLEAR   Specific Gravity, Urine 1.018 1.005 - 1.030   pH 5.0 5.0 - 8.0   Glucose, UA NEGATIVE NEGATIVE mg/dL   Hgb urine dipstick NEGATIVE NEGATIVE    Bilirubin Urine NEGATIVE NEGATIVE   Ketones, ur NEGATIVE NEGATIVE mg/dL   Protein, ur NEGATIVE NEGATIVE mg/dL   Nitrite NEGATIVE NEGATIVE   Leukocytes, UA NEGATIVE NEGATIVE    Comment: Performed at [facility], [address] [place],  [address]  BMP8+EGFR     Status: Abnormal   Collection Time: 12/24/17 12:40 PM  Result Value Ref Range   Glucose 112 (H) 65 - 99 mg/dL   BUN 12 8 - 27 mg/dL   Creatinine, Ser 1.24 0.76 - 1.27 mg/dL   GFR calc non Af Amer 53 (L) >59 mL/min/1.73   GFR calc Af Amer 62 >59 mL/min/1.73   BUN/Creatinine Ratio 10 10 - 24   Sodium 139 134 - 144 mmol/L   Potassium   4.5 3.5 - 5.2 mmol/L   Chloride 101 96 - 106 mmol/L   CO2 26 20 - 29 mmol/L   Calcium 9.1 8.6 - 10.2 mg/dL  CBC with Differential/Platelet     Status: Abnormal   Collection Time: 12/24/17 12:40 PM  Result Value Ref Range   WBC 8.6 3.4 - 10.8 x10E3/uL   RBC 3.76 (L) 4.14 - 5.80 x10E6/uL   Hemoglobin 8.5 (LL) 13.0 - 17.7 g/dL    Comment:                   Client Requested Flag   Hematocrit 27.9 (L) 37.5 - 51.0 %   MCV 74 (L) 79 - 97 fL   MCH 22.6 (L) 26.6 - 33.0 pg   MCHC 30.5 (L) 31.5 - 35.7 g/dL   RDW 14.1 12.3 - 15.4 %   Platelets 398 150 - 450 x10E3/uL   Neutrophils 71 Not Estab. %   Lymphs 14 Not Estab. %   Monocytes 9 Not Estab. %   Eos 5 Not Estab. %   Basos 1 Not Estab. %   Neutrophils Absolute 6.2 1.4 - 7.0 x10E3/uL   Lymphocytes Absolute 1.2 0.7 - 3.1 x10E3/uL   Monocytes Absolute 0.7 0.1 - 0.9 x10E3/uL   EOS (ABSOLUTE) 0.4 0.0 - 0.4 x10E3/uL   Basophils Absolute 0.1 0.0 - 0.2 x10E3/uL   Immature Granulocytes 0 Not Estab. %   Immature Grans (Abs) 0.0 0.0 - 0.1 x10E3/uL  Ferritin     Status: None   Collection Time: 12/24/17 12:40 PM  Result Value Ref Range   Ferritin 30 30 - 400 ng/mL  Iron and TIBC([facility])     Status: Abnormal   Collection Time: 12/24/17 12:40 PM  Result Value Ref Range   Total Iron Binding Capacity 312 250 - 450 ug/dL   UIBC 299 111 - 343  ug/dL   Iron 13 (L) 38 - 169 ug/dL   Iron Saturation 4 (LL) 15 - 55 %  Vitamin B12     Status: None   Collection Time: 12/24/17 12:40 PM  Result Value Ref Range   Vitamin B-12 599 232 - 1,245 pg/mL  CBC with Differential     Status: Abnormal   Collection Time: 12/25/17  4:49 PM  Result Value Ref Range   WBC 7.2 4.0 - 10.5 K/uL   RBC 3.63 (L) 4.22 - 5.81 MIL/uL   Hemoglobin 8.1 (L) 13.0 - 17.0 g/dL   HCT 27.8 (L) 39.0 - 52.0 %   MCV 76.6 (L) 78.0 - 100.0 fL   MCH 22.3 (L) 26.0 - 34.0 pg   MCHC 29.1 (L) 30.0 - 36.0 g/dL   RDW 14.8 11.5 - 15.5 %   Platelets 350 150 - 400 K/uL   Neutrophils Relative % 70 %   Neutro Abs 5.1 1.7 - 7.7 K/uL   Lymphocytes Relative 17 %   Lymphs Abs 1.2 0.7 - 4.0 K/uL   Monocytes Relative 8 %   Monocytes Absolute 0.6 0.1 - 1.0 K/uL   Eosinophils Relative 4 %   Eosinophils Absolute 0.3 0.0 - 0.7 K/uL   Basophils Relative 1 %   Basophils Absolute 0.0 0.0 - 0.1 K/uL    Comment: Performed at [facility], [address] [place], [place] [address]  Basic metabolic panel     Status: Abnormal   Collection Time: 12/25/17  4:49 PM  Result Value Ref Range   Sodium 137 135 - 145 mmol/L   Potassium 3.7 3.5 -   5.1 mmol/L   Chloride 105 98 - 111 mmol/L   CO2 25 22 - 32 mmol/L   Glucose, Bld 142 (H) 70 - 99 mg/dL   BUN 9 8 - 23 mg/dL   Creatinine, Ser 0.83 0.61 - 1.24 mg/dL   Calcium 8.6 (L) 8.9 - 10.3 mg/dL   GFR calc non Af Amer >60 >60 mL/min   GFR calc Af Amer >60 >60 mL/min    Comment: (NOTE) The eGFR has been calculated using the CKD EPI equation. This calculation has not been validated in all clinical situations. eGFR's persistently <60 mL/min signify possible Chronic Kidney Disease.    Anion gap 7 5 - 15    Comment: Performed at [facility], [address] [place], [place] [address]  Troponin I     Status: Abnormal   Collection Time: 12/25/17  4:49 PM  Result Value Ref Range   Troponin I 0.03 (HH) <0.03 ng/mL    Comment: CRITICAL RESULT CALLED  TO, READ BACK BY AND VERIFIED WITH: MOORE,M ON 12/25/17 AT 1730 BY LOY,C Performed at [facility], [address] [place], [place] [address]   CBG monitoring, ED     Status: Abnormal   Collection Time: 12/25/17  5:02 PM  Result Value Ref Range   Glucose-Capillary 129 (H) 70 - 99 mg/dL  Urinalysis, Routine w reflex microscopic     Status: None   Collection Time: 12/25/17  7:23 PM  Result Value Ref Range   Color, Urine YELLOW YELLOW   APPearance CLEAR CLEAR   Specific Gravity, Urine 1.009 1.005 - 1.030   pH 7.0 5.0 - 8.0   Glucose, UA NEGATIVE NEGATIVE mg/dL   Hgb urine dipstick NEGATIVE NEGATIVE   Bilirubin Urine NEGATIVE NEGATIVE   Ketones, ur NEGATIVE NEGATIVE mg/dL   Protein, ur NEGATIVE NEGATIVE mg/dL   Nitrite NEGATIVE NEGATIVE   Leukocytes, UA NEGATIVE NEGATIVE    Comment: Performed at [facility], [address] [place], [place] [address]  Glucose, capillary     Status: Abnormal   Collection Time: 12/25/17 10:00 PM  Result Value Ref Range   Glucose-Capillary 104 (H) 70 - 99 mg/dL  CBC     Status: Abnormal   Collection Time: 12/26/17  1:19 AM  Result Value Ref Range   WBC 6.9 4.0 - 10.5 K/uL   RBC 3.32 (L) 4.22 - 5.81 MIL/uL   Hemoglobin 7.5 (L) 13.0 - 17.0 g/dL   HCT 25.3 (L) 39.0 - 52.0 %   MCV 76.2 (L) 78.0 - 100.0 fL   MCH 22.6 (L) 26.0 - 34.0 pg   MCHC 29.6 (L) 30.0 - 36.0 g/dL   RDW 15.0 11.5 - 15.5 %   Platelets 309 150 - 400 K/uL    Comment: Performed at [facility], [address] [place], [place] [address]  Type and screen [facility]     Status: None   Collection Time: 12/26/17  1:19 AM  Result Value Ref Range   ABO/RH(D) O POS    Antibody Screen NEG    Sample Expiration 12/29/2017    Unit Number [id number]    Blood Component Type RED CELLS,LR    Unit division 00    Status of Unit ISSUED,FINAL    Transfusion Status OK TO TRANSFUSE    Crossmatch Result      Compatible Performed at  Hospital, [address] [place], [place]  [address]   Prepare RBC     Status: None   Collection Time: 12/26/17    1:19 AM  Result Value Ref Range   Order Confirmation      ORDER PROCESSED BY BLOOD BANK Performed at [facility], [address] [place], [place] [address]   BPAM RBC     Status: None   Collection Time: 12/26/17  1:19 AM  Result Value Ref Range   ISSUE DATE / TIME [id number]    Blood Product Unit Number [id number]    PRODUCT CODE [id number]    Unit Type and Rh 5100    Blood Product Expiration Date [id number]   Occult blood card to lab, stool     Status: None   Collection Time: 12/26/17  2:48 AM  Result Value Ref Range   Fecal Occult Bld NEGATIVE NEGATIVE    Comment: Performed at [facility], [address] [place], [place] [address]  Glucose, capillary     Status: Abnormal   Collection Time: 12/26/17  7:31 AM  Result Value Ref Range   Glucose-Capillary 120 (H) 70 - 99 mg/dL  Glucose, capillary     Status: Abnormal   Collection Time: 12/26/17 11:10 AM  Result Value Ref Range   Glucose-Capillary 131 (H) 70 - 99 mg/dL  CBC with Differential/Platelet     Status: Abnormal   Collection Time: 12/26/17  2:29 PM  Result Value Ref Range   WBC 7.4 4.0 - 10.5 K/uL   RBC 3.84 (L) 4.22 - 5.81 MIL/uL   Hemoglobin 9.1 (L) 13.0 - 17.0 g/dL   HCT 30.1 (L) 39.0 - 52.0 %   MCV 78.4 78.0 - 100.0 fL   MCH 23.7 (L) 26.0 - 34.0 pg   MCHC 30.2 30.0 - 36.0 g/dL   RDW 15.9 (H) 11.5 - 15.5 %   Platelets 307 150 - 400 K/uL   Neutrophils Relative % 68 %   Neutro Abs 5.1 1.7 - 7.7 K/uL   Lymphocytes Relative 19 %   Lymphs Abs 1.4 0.7 - 4.0 K/uL   Monocytes Relative 9 %   Monocytes Absolute 0.6 0.1 - 1.0 K/uL   Eosinophils Relative 4 %   Eosinophils Absolute 0.3 0.0 - 0.7 K/uL   Basophils Relative 0 %   Basophils Absolute 0.0 0.0 - 0.1 K/uL    Comment: Performed at [facility], [address], [place], [place] [address]  Iron and TIBC     Status: None   Collection Time: 12/26/17  2:29 PM  Result Value Ref Range   Iron 81  45 - 182 ug/dL   TIBC 352 250 - 450 ug/dL   Saturation Ratios 23 17.9 - 39.5 %   UIBC 271 ug/dL    Comment: Performed at [facility], [address], [place], [place] [address]  Ferritin     Status: Abnormal   Collection Time: 12/26/17  2:29 PM  Result Value Ref Range   Ferritin 15 (L) 24 - 336 ng/mL    Comment: Performed at [facility], [address], [place], [place] [address]  Glucose, capillary     Status: Abnormal   Collection Time: 12/26/17  4:40 PM  Result Value Ref Range   Glucose-Capillary 217 (H) 70 - 99 mg/dL  Glucose, capillary     Status: None   Collection Time: 12/26/17 10:04 PM  Result Value Ref Range   Glucose-Capillary 95 70 - 99 mg/dL   Comment 1 Notify RN    Comment 2 Document in Chart   Glucose, capillary     Status: Abnormal   Collection Time: 12/27/17  7:45 AM  Result Value Ref Range   Glucose-Capillary 112 (H) 70 - 99 mg/dL   Comment 1 Notify RN    Comment 2 Document in Chart   CBC     Status: Abnormal   Collection Time: 12/27/17  9:07 AM  Result Value Ref Range   WBC 7.6 4.0 - 10.5 K/uL   RBC 3.72 (L) 4.22 - 5.81 MIL/uL   Hemoglobin 8.9 (L) 13.0 - 17.0 g/dL   HCT 29.3 (L) 39.0 - 52.0 %   MCV 78.8 78.0 - 100.0 fL   MCH 23.9 (L) 26.0 - 34.0 pg   MCHC 30.4 30.0 - 36.0 g/dL   RDW 16.1 (H) 11.5 - 15.5 %   Platelets 293 150 - 400 K/uL    Comment: Performed at [facility], [address] [place], [place] [address]  Basic metabolic panel     Status: Abnormal   Collection Time: 12/27/17  9:07 AM  Result Value Ref Range   Sodium 138 135 - 145 mmol/L   Potassium 4.1 3.5 - 5.1 mmol/L   Chloride 105 98 - 111 mmol/L   CO2 26 22 - 32 mmol/L   Glucose, Bld 110 (H) 70 - 99 mg/dL   BUN 8 8 - 23 mg/dL   Creatinine, Ser 0.98 0.61 - 1.24 mg/dL   Calcium 8.6 (L) 8.9 - 10.3 mg/dL   GFR calc non Af Amer >60 >60 mL/min   GFR calc Af Amer >60 >60 mL/min    Comment: (NOTE) The eGFR has been calculated using the CKD EPI equation. This calculation has not been  validated in all clinical situations. eGFR's persistently <60 mL/min signify possible Chronic Kidney Disease.    Anion gap 7 5 - 15    Comment: Performed at [facility], [address] [place], [place] [address]  Glucose, capillary     Status: Abnormal   Collection Time: 12/27/17 11:40 AM  Result Value Ref Range   Glucose-Capillary 111 (H) 70 - 99 mg/dL   Comment 1 Notify RN    Comment 2 Document in Chart   Glucose, capillary     Status: None   Collection Time: 12/27/17  3:30 PM  Result Value Ref Range   Glucose-Capillary 90 70 - 99 mg/dL  Glucose, capillary     Status: Abnormal   Collection Time: 12/27/17  9:33 PM  Result Value Ref Range   Glucose-Capillary 102 (H) 70 - 99 mg/dL   Comment 1 Notify RN    Comment 2 Document in Chart   CBC     Status: Abnormal   Collection Time: 12/28/17  6:21 AM  Result Value Ref Range   WBC 7.9 4.0 - 10.5 K/uL   RBC 3.97 (L) 4.22 - 5.81 MIL/uL   Hemoglobin 9.3 (L) 13.0 - 17.0 g/dL   HCT 31.1 (L) 39.0 - 52.0 %   MCV 78.3 78.0 - 100.0 fL   MCH 23.4 (L) 26.0 - 34.0 pg   MCHC 29.9 (L) 30.0 - 36.0 g/dL   RDW 16.1 (H) 11.5 - 15.5 %   Platelets 321 150 - 400 K/uL    Comment: Performed at  Hospital, [address] [place], [place] [address]  Basic metabolic panel     Status: Abnormal   Collection Time: 12/28/17  6:21 AM  Result Value Ref Range   Sodium 137 135 - 145 mmol/L   Potassium 3.8 3.5 - 5.1 mmol/L   Chloride 104 98 - 111 mmol/L   CO2 25 22 - 32 mmol/L     Glucose, Bld 100 (H) 70 - 99 mg/dL   BUN 7 (L) 8 - 23 mg/dL   Creatinine, Ser 0.98 0.61 - 1.24 mg/dL   Calcium 8.6 (L) 8.9 - 10.3 mg/dL   GFR calc non Af Amer >60 >60 mL/min   GFR calc Af Amer >60 >60 mL/min    Comment: (NOTE) The eGFR has been calculated using the CKD EPI equation. This calculation has not been validated in all clinical situations. eGFR's persistently <60 mL/min signify possible Chronic Kidney Disease.    Anion gap 8 5 - 15    Comment: Performed at [facility], [address] [place], [place] [address]  H. pylori antibody, IgG     Status: None   Collection Time: 12/28/17  6:21 AM  Result Value Ref Range   H Pylori IgG 0.52 0.00 - 0.79 Index Value    Comment: (NOTE)                             Negative           <0.80                             Equivocal    0.80 - 0.89                             Positive           >0.89 Performed At: [facility] [address] [place], [place] [id number] Nagendra Sanjai MD Ph:[phone]   Glucose, capillary     Status: None   Collection Time: 12/28/17  7:35 AM  Result Value Ref Range   Glucose-Capillary 89 70 - 99 mg/dL  Glucose, capillary     Status: None   Collection Time: 12/28/17  8:48 AM  Result Value Ref Range   Glucose-Capillary 85 70 - 99 mg/dL  Glucose, capillary     Status: Abnormal   Collection Time: 12/28/17  9:50 AM  Result Value Ref Range   Glucose-Capillary 101 (H) 70 - 99 mg/dL  Glucose, capillary     Status: None   Collection Time: 12/28/17 10:50 AM  Result Value Ref Range   Glucose-Capillary 83 70 - 99 mg/dL  Basic metabolic panel     Status: Abnormal   Collection Time: 01/06/18  6:09 PM  Result Value Ref Range   Sodium 134 (L) 135 - 145 mmol/L   Potassium 4.4 3.5 - 5.1 mmol/L   Chloride 100 98 - 111 mmol/L   CO2 27 22 - 32 mmol/L   Glucose, Bld 120 (H) 70 - 99 mg/dL   BUN 23 8 - 23 mg/dL   Creatinine, Ser 1.73 (H) 0.61 - 1.24 mg/dL   Calcium 8.8 (L) 8.9 - 10.3 mg/dL   GFR calc non Af Amer 35 (L) >60 mL/min   GFR calc Af Amer 40 (L) >60 mL/min    Comment: (NOTE) The eGFR has been calculated using the CKD EPI equation. This calculation has not been validated in all clinical situations. eGFR's persistently <60 mL/min signify possible Chronic Kidney Disease.    Anion gap 7 5 - 15    Comment: Performed at [facility], [address] [place],  [address]  CBC with Differential     Status: Abnormal   Collection Time: 01/06/18  6:09 PM  Result Value Ref  Range     WBC 8.3 4.0 - 10.5 K/uL   RBC 3.91 (L) 4.22 - 5.81 MIL/uL   Hemoglobin 8.9 (L) 13.0 - 17.0 g/dL   HCT 30.7 (L) 39.0 - 52.0 %   MCV 78.5 (L) 80.0 - 100.0 fL   MCH 22.8 (L) 26.0 - 34.0 pg   MCHC 29.0 (L) 30.0 - 36.0 g/dL   RDW 16.6 (H) 11.5 - 15.5 %   Platelets 293 150 - 400 K/uL   nRBC 0.0 0.0 - 0.2 %   Neutrophils Relative % 62 %   Neutro Abs 5.1 1.7 - 7.7 K/uL   Lymphocytes Relative 22 %   Lymphs Abs 1.9 0.7 - 4.0 K/uL   Monocytes Relative 11 %   Monocytes Absolute 0.9 0.1 - 1.0 K/uL   Eosinophils Relative 4 %   Eosinophils Absolute 0.3 0.0 - 0.5 K/uL   Basophils Relative 1 %   Basophils Absolute 0.0 0.0 - 0.1 K/uL   Immature Granulocytes 0 %   Abs Immature Granulocytes 0.02 0.00 - 0.07 K/uL    Comment: Performed at [facility], [address] [place], [place] [address]  Brain natriuretic peptide     Status: Abnormal   Collection Time: 01/06/18  6:12 PM  Result Value Ref Range   B Natriuretic Peptide 128.0 (H) 0.0 - 100.0 pg/mL    Comment: Performed at [facility], [address] [place], [place] [address]  BMP8+EGFR     Status: Abnormal   Collection Time: 01/21/18 10:58 AM  Result Value Ref Range   Glucose 100 (H) 65 - 99 mg/dL   BUN 24 8 - 27 mg/dL   Creatinine, Ser 1.92 (H) 0.76 - 1.27 mg/dL   GFR calc non Af Amer 31 (L) >59 mL/min/1.73   GFR calc Af Amer 36 (L) >59 mL/min/1.73   BUN/Creatinine Ratio 13 10 - 24   Sodium 137 134 - 144 mmol/L   Potassium 4.9 3.5 - 5.2 mmol/L   Chloride 99 96 - 106 mmol/L   CO2 25 20 - 29 mmol/L   Calcium 9.1 8.6 - 10.2 mg/dL  CBC with Differential/Platelet     Status: Abnormal   Collection Time: 01/21/18 10:58 AM  Result Value Ref Range   WBC 8.3 3.4 - 10.8 x10E3/uL   RBC 3.91 (L) 4.14 - 5.80 x10E6/uL   Hemoglobin 8.8 (LL) 13.0 - 17.7 g/dL    Comment:                   Client Requested Flag   Hematocrit 28.9 (L) 37.5 - 51.0 %   MCV 74 (L) 79 - 97 fL   MCH 22.5 (L) 26.6 - 33.0 pg   MCHC 30.4 (L) 31.5 - 35.7 g/dL   RDW  16.1 (H) 12.3 - 15.4 %   Platelets 251 150 - 450 x10E3/uL   Neutrophils 63 Not Estab. %   Lymphs 20 Not Estab. %   Monocytes 10 Not Estab. %   Eos 6 Not Estab. %   Basos 1 Not Estab. %   Neutrophils Absolute 5.2 1.4 - 7.0 x10E3/uL   Lymphocytes Absolute 1.7 0.7 - 3.1 x10E3/uL   Monocytes Absolute 0.8 0.1 - 0.9 x10E3/uL   EOS (ABSOLUTE) 0.5 (H) 0.0 - 0.4 x10E3/uL   Basophils Absolute 0.1 0.0 - 0.2 x10E3/uL   Immature Granulocytes 0 Not Estab. %   Immature Grans (Abs) 0.0 0.0 - 0.1 x10E3/uL  Bayer DCA Hb A1c Waived     Status: None   Collection Time: 01/21/18   10:59 AM  Result Value Ref Range   HB A1C (BAYER DCA - WAIVED) 6.5 <7.0 %    Comment:                                       Diabetic Adult            <7.0                                       Healthy Adult        4.3 - 5.7                                                           (DCCT/NGSP) American Diabetes Association's Summary of Glycemic Recommendations for Adults with Diabetes: Hemoglobin A1c <7.0%. More stringent glycemic goals (A1c <6.0%) may further reduce complications at the cost of increased risk of hypoglycemia.   Comprehensive metabolic panel     Status: Abnormal   Collection Time: 01/26/18  4:45 PM  Result Value Ref Range   Sodium 135 135 - 145 mmol/L   Potassium 4.0 3.5 - 5.1 mmol/L   Chloride 100 98 - 111 mmol/L   CO2 27 22 - 32 mmol/L   Glucose, Bld 123 (H) 70 - 99 mg/dL   BUN 16 8 - 23 mg/dL   Creatinine, Ser 1.42 (H) 0.61 - 1.24 mg/dL   Calcium 8.6 (L) 8.9 - 10.3 mg/dL   Total Protein 6.6 6.5 - 8.1 g/dL   Albumin 3.2 (L) 3.5 - 5.0 g/dL   AST 33 15 - 41 U/L   ALT 20 0 - 44 U/L   Alkaline Phosphatase 82 38 - 126 U/L   Total Bilirubin 0.7 0.3 - 1.2 mg/dL   GFR calc non Af Amer 44 (L) >60 mL/min   GFR calc Af Amer 51 (L) >60 mL/min    Comment: (NOTE) The eGFR has been calculated using the CKD EPI equation. This calculation has not been validated in all clinical situations. eGFR's persistently <60  mL/min signify possible Chronic Kidney Disease.    Anion gap 8 5 - 15    Comment: Performed at [facility], [address], [place], [place] [address]  Lipase, blood     Status: None   Collection Time: 01/26/18  4:45 PM  Result Value Ref Range   Lipase 51 11 - 51 U/L    Comment: Performed at [facility], [address], [place], [place] [address]  Troponin I     Status: None   Collection Time: 01/26/18  4:45 PM  Result Value Ref Range   Troponin I <0.03 <0.03 ng/mL    Comment: Performed at [facility], [address] [place], [place] [address]  CBC with Differential     Status: Abnormal   Collection Time: 01/26/18  4:45 PM  Result Value Ref Range   WBC 6.9 4.0 - 10.5 K/uL   RBC 3.66 (L) 4.22 - 5.81 MIL/uL   Hemoglobin 8.1 (L) 13.0 - 17.0 g/dL    Comment: Reticulocyte Hemoglobin testing may be clinically indicated, consider ordering this additional test [id number]    HCT 28.1 (  L) 39.0 - 52.0 %   MCV 76.8 (L) 80.0 - 100.0 fL   MCH 22.1 (L) 26.0 - 34.0 pg   MCHC 28.8 (L) 30.0 - 36.0 g/dL   RDW 17.4 (H) 11.5 - 15.5 %   Platelets 205 150 - 400 K/uL   nRBC 0.0 0.0 - 0.2 %   Neutrophils Relative % 65 %   Neutro Abs 4.5 1.7 - 7.7 K/uL   Lymphocytes Relative 21 %   Lymphs Abs 1.4 0.7 - 4.0 K/uL   Monocytes Relative 11 %   Monocytes Absolute 0.7 0.1 - 1.0 K/uL   Eosinophils Relative 3 %   Eosinophils Absolute 0.2 0.0 - 0.5 K/uL   Basophils Relative 0 %   Basophils Absolute 0.0 0.0 - 0.1 K/uL   Immature Granulocytes 0 %   Abs Immature Granulocytes 0.03 0.00 - 0.07 K/uL    Comment: Performed at [facility], [address] [place], [place] [address]  Ethanol     Status: None   Collection Time: 01/26/18  4:45 PM  Result Value Ref Range   Alcohol, Ethyl (B) <10 <10 mg/dL    Comment: (NOTE) Lowest detectable limit for serum alcohol is 10 mg/dL. For medical purposes only. Performed at [facility], [address] [place], [place] [address]   Protime-INR     Status: None    Collection Time: 01/26/18  4:45 PM  Result Value Ref Range   Prothrombin Time 13.2 11.4 - 15.2 seconds   INR 1.01     Comment: Performed at [facility], [address] [place], [place] [address]  APTT     Status: None   Collection Time: 01/26/18  4:45 PM  Result Value Ref Range   aPTT 32 24 - 36 seconds    Comment: Performed at [facility], [address] [place], [place] [address]  Glucose, capillary     Status: Abnormal   Collection Time: 01/26/18  8:15 PM  Result Value Ref Range   Glucose-Capillary 115 (H) 70 - 99 mg/dL  Prepare RBC     Status: None   Collection Time: 01/26/18  8:52 PM  Result Value Ref Range   Order Confirmation      ORDER PROCESSED BY BLOOD BANK Performed at [facility], [address] [place], [place] [address]   Type and screen [facility]     Status: None   Collection Time: 01/26/18  8:52 PM  Result Value Ref Range   ABO/RH(D) O POS    Antibody Screen NEG    Sample Expiration 01/29/2018    Unit Number [id number]    Blood Component Type RED CELLS,LR    Unit division 00    Status of Unit ISSUED,FINAL    Transfusion Status OK TO TRANSFUSE    Crossmatch Result      Compatible Performed at [facility], [address] [place], [place] [address]   Troponin I (q 6hr x 3)     Status: None   Collection Time: 01/26/18  8:52 PM  Result Value Ref Range   Troponin I <0.03 <0.03 ng/mL    Comment: Performed at [facility], [address] [place], [place] [address]  BPAM RBC     Status: None   Collection Time: 01/26/18  8:52 PM  Result Value Ref Range   ISSUE DATE / TIME [id number]    Blood Product Unit Number [id number]    PRODUCT CODE [id number]    Unit Type and Rh 5100      Blood Product Expiration Date [id number]   CBC     Status: Abnormal   Collection Time: 01/27/18  5:09 AM  Result Value Ref Range   WBC 6.1 4.0 - 10.5 K/uL   RBC 3.83 (L) 4.22 - 5.81 MIL/uL   Hemoglobin 8.9 (L) 13.0 - 17.0 g/dL   HCT 29.9 (L) 39.0 - 52.0 %   MCV  78.1 (L) 80.0 - 100.0 fL   MCH 23.2 (L) 26.0 - 34.0 pg   MCHC 29.8 (L) 30.0 - 36.0 g/dL   RDW 17.4 (H) 11.5 - 15.5 %   Platelets 194 150 - 400 K/uL   nRBC 0.0 0.0 - 0.2 %    Comment: Performed at [facility], [address] [place], [place] [address]  Troponin I (q 6hr x 3)     Status: None   Collection Time: 01/27/18  5:09 AM  Result Value Ref Range   Troponin I <0.03 <0.03 ng/mL    Comment: Performed at [facility], [address], [place], [place] [address]  Glucose, capillary     Status: None   Collection Time: 01/27/18  7:38 AM  Result Value Ref Range   Glucose-Capillary 96 70 - 99 mg/dL  ECHOCARDIOGRAM COMPLETE     Status: None   Collection Time: 01/27/18  9:42 AM  Result Value Ref Range   Weight 2,752 oz   Height 69 in   BP 133/80 mmHg  Glucose, capillary     Status: Abnormal   Collection Time: 01/27/18 11:15 AM  Result Value Ref Range   Glucose-Capillary 131 (H) 70 - 99 mg/dL  Glucose, capillary     Status: None   Collection Time: 01/27/18  4:19 PM  Result Value Ref Range   Glucose-Capillary 97 70 - 99 mg/dL  Hemoglobin, fingerstick     Status: Abnormal   Collection Time: 02/06/18 12:16 PM  Result Value Ref Range   Hemoglobin 8.0 (L) 12.6 - 17.7 g/dL    RADIOGRAPHIC STUDIES: I have personally reviewed the radiological images as listed and agreed with the findings in the report. Dg Chest 2 View  Result Date: 01/26/2018 CLINICAL DATA:  Dizziness EXAM: CHEST - 2 VIEW COMPARISON:  12/19/2017, 11/03/2017, 10/31/2016 FINDINGS: Chronic pleural and parenchymal changes on the left. Post sternotomy changes and valve prosthesis. No focal consolidation. Normal heart size. Aortic atherosclerosis. No pneumothorax. IMPRESSION: No active cardiopulmonary disease. Stable chronic pleural and parenchymal changes at the left base. Electronically Signed   By: Donavan Foil M.D.   On: 01/26/2018 16:48   Mr Jodene Nam Head Wo Contrast  Addendum Date: 01/27/2018   ADDENDUM REPORT: 01/27/2018  10:05 ADDENDUM: Omitted finding of superficial siderosis along the right frontal convexity that is nonprogressive since 2018, likely from remote trauma. Electronically Signed   By: Monte Fantasia M.D.   On: 01/27/2018 10:05   Result Date: 01/27/2018 CLINICAL DATA:  Dizziness for 2 days. EXAM: MRI HEAD WITHOUT CONTRAST MRA HEAD WITHOUT CONTRAST TECHNIQUE: Multiplanar, multiecho pulse sequences of the brain and surrounding structures were obtained without intravenous contrast. Angiographic images of the head were obtained using MRA technique without contrast. COMPARISON:  10/31/2016 FINDINGS: MRI HEAD FINDINGS Brain: Tiny acute infarcts about the frontal horn of the right lateral ventricle and atrium of the left lateral ventricle. No hemorrhage or obstructive hydrocephalus. No mass or collection. Advanced atrophy. There is a degree of corpus callosum up lifting in communicating hydrocephalus is not excluded. Chronic small vessel ischemia with moderate confluent rim around the lateral ventricles. Chronic  superficial siderosis along the right frontal convexity. Vascular: Chronic loss of right vertebral flow void, see below. Skull and upper cervical spine: No focal marrow lesion. Sinuses/Orbits: Bilateral cataract resection. MRA HEAD FINDINGS Motion degraded. No branch occlusion, beading, or aneurysm. There is atheromatous irregularity of the left vertebral, basilar, and bilateral carotids without flow limiting stenosis. No flow is seen within the right vertebral artery, chronic based on prior T2 weighted imaging. Flow is still visible within the visible right PICA. Poor signal in the distal posterior cerebral arteries is likely due to direction of flow. IMPRESSION: Brain MRI: 1. Two punctate acute infarcts in the periventricular white matter, 1 in each cerebral hemisphere. 2. Advanced atrophy. There may be superimposed communicating hydrocephalus. Intracranial MRA: 1. No emergent finding. 2. Chronic occlusion of the  right vertebral artery. Electronically Signed: By: Jonathon  Watts M.D. On: 01/26/2018 16:37   Mr Brain Wo Contrast (neuro Protocol)  Addendum Date: 01/27/2018   ADDENDUM REPORT: 01/27/2018 10:05 ADDENDUM: Omitted finding of superficial siderosis along the right frontal convexity that is nonprogressive since 2018, likely from remote trauma. Electronically Signed   By: Jonathon  Watts M.D.   On: 01/27/2018 10:05   Result Date: 01/27/2018 CLINICAL DATA:  Dizziness for 2 days. EXAM: MRI HEAD WITHOUT CONTRAST MRA HEAD WITHOUT CONTRAST TECHNIQUE: Multiplanar, multiecho pulse sequences of the brain and surrounding structures were obtained without intravenous contrast. Angiographic images of the head were obtained using MRA technique without contrast. COMPARISON:  10/31/2016 FINDINGS: MRI HEAD FINDINGS Brain: Tiny acute infarcts about the frontal horn of the right lateral ventricle and atrium of the left lateral ventricle. No hemorrhage or obstructive hydrocephalus. No mass or collection. Advanced atrophy. There is a degree of corpus callosum up lifting in communicating hydrocephalus is not excluded. Chronic small vessel ischemia with moderate confluent rim around the lateral ventricles. Chronic superficial siderosis along the right frontal convexity. Vascular: Chronic loss of right vertebral flow void, see below. Skull and upper cervical spine: No focal marrow lesion. Sinuses/Orbits: Bilateral cataract resection. MRA HEAD FINDINGS Motion degraded. No branch occlusion, beading, or aneurysm. There is atheromatous irregularity of the left vertebral, basilar, and bilateral carotids without flow limiting stenosis. No flow is seen within the right vertebral artery, chronic based on prior T2 weighted imaging. Flow is still visible within the visible right PICA. Poor signal in the distal posterior cerebral arteries is likely due to direction of flow. IMPRESSION: Brain MRI: 1. Two punctate acute infarcts in the  periventricular white matter, 1 in each cerebral hemisphere. 2. Advanced atrophy. There may be superimposed communicating hydrocephalus. Intracranial MRA: 1. No emergent finding. 2. Chronic occlusion of the right vertebral artery. Electronically Signed: By: Jonathon  Watts M.D. On: 01/26/2018 16:37    ASSESSMENT & PLAN:  Microcytic anemia 1.  Microcytic anemia: - He was admitted to the hospital from 12/25/2017 through 12/28/2017 and received 1 unit of PRBC.  Ferritin was 15 and percent saturation was 23.  Creatinine was elevated at 1.7. -he was readmitted for a fall on 01/26/2018 and had to receive 1 more unit of PRBC for hemoglobin of 8.1.  MCV was 76.8. - He was reportedly on iron tablets 3 times a day for the last 2 months and had severe constipation episodes. - I have reviewed his labs.  He has been anemic on and off since 2012.  He has been microcytic since May 2019.  He had elevated creatinine since October 2019. -His colonoscopy on 12/28/2017 shows 5 mm polyp in the mid transverse   colon, diverticulosis of the sigmoid colon, external hemorrhoids. -EGD on 12/28/2017 shows normal esophagus, 6 cm hiatal hernia, erosive gastropathy, duodenal erosion without bleeding. -he is currently on baby aspirin daily as he had couple of strokes on MRI on most recent admission. - He lives at home by himself, with caregiver around-the-clock. - We will repeat his CBC today.  We will check him for nutritional deficiencies including ferritin, iron panel, T90, copper and folic acid.  We will also check an SPEP to rule out plasma cell disorder. - I have talked to him about starting him on Feraheme weekly x2.  We talked about the side effects in detail including but not limited to anaphylactic reactions.  I have told him to discontinue oral iron.  We will schedule him for an infusion early next week.  I plan to repeat his labs in 6 weeks.     All questions were answered. The patient knows to call the clinic with any  problems, questions or concerns.     Derek Jack, MD 02/12/18 1:57 PM

## 2018-02-12 NOTE — Addendum Note (Signed)
Addended by: Glennie Isle on: 02/12/2018 02:03 PM   Modules accepted: Orders

## 2018-02-12 NOTE — Patient Instructions (Signed)
[facility] at [facility] Discharge Instructions  Follow up in 6 weeks with labs .   Thank you for choosing [facility] at [facility] to provide your oncology and hematology care.  To afford each patient quality time with our provider, please arrive at least 15 minutes before your scheduled appointment time.   If you have a lab appointment with the Cancer Center please come in thru the  Main Entrance and check in at the main information desk  You need to re-schedule your appointment should you arrive 10 or more minutes late.  We strive to give you quality time with our providers, and arriving late affects you and other patients whose appointments are after yours.  Also, if you no show three or more times for appointments you may be dismissed from the clinic at the providers discretion.     Again, thank you for choosing [facility].  Our hope is that these requests will decrease the amount of time that you wait before being seen by our physicians.       _____________________________________________________________  Should you have questions after your visit to [facility], please contact our office at [phone] between the hours of 8:00 a.m. and 4:30 p.m.  Voicemails left after 4:00 p.m. will not be returned until the following business day.  For prescription refill requests, have your pharmacy contact our office and allow 72 hours.    Cancer Center Support Programs:   > Cancer Support Group  2nd Tuesday of the month 1pm-2pm, Journey Room    

## 2018-02-12 NOTE — Telephone Encounter (Signed)
Aware that she will be back tomorrow

## 2018-02-13 ENCOUNTER — Other Ambulatory Visit: Payer: Self-pay

## 2018-02-13 LAB — PROTEIN ELECTROPHORESIS, SERUM
A/G Ratio: 1 (ref 0.7–1.7)
ALBUMIN ELP: 3.3 g/dL (ref 2.9–4.4)
ALPHA-1-GLOBULIN: 0.2 g/dL (ref 0.0–0.4)
Alpha-2-Globulin: 0.7 g/dL (ref 0.4–1.0)
Beta Globulin: 1 g/dL (ref 0.7–1.3)
GLOBULIN, TOTAL: 3.2 g/dL (ref 2.2–3.9)
Gamma Globulin: 1.2 g/dL (ref 0.4–1.8)
TOTAL PROTEIN ELP: 6.5 g/dL (ref 6.0–8.5)

## 2018-02-13 NOTE — Progress Notes (Signed)
Christy aware of both referrals

## 2018-02-13 NOTE — Telephone Encounter (Signed)
[name] [facility]) ph # [phone]

## 2018-02-13 NOTE — Telephone Encounter (Signed)
Please let me know whom I need to call and what the number is and I will call them and speak to them about any recommendations they have for [name]

## 2018-02-13 NOTE — Patient Outreach (Signed)
[facility]) Care Management  02/13/2018  [name] [birth year] [id number]   Follow up call to patient's daughter to ensure receipt of in-home providers and Advance Directives.  Ms. [name] confirmed receipt and said that they now have someone providing care for [name] in the evening. BSW talked with [name] about the HCPOA and Living Will.  BSW educated her on completion of these documents and what to do with them once completed.  BSW is closing case at this time but encouraged ms. [name] to call if any new needs arise or if she has additional questions about Advance Directives.   [name], BSW Social Worker [phone]

## 2018-02-14 LAB — COPPER, SERUM: COPPER: 126 ug/dL (ref 72–166)

## 2018-02-16 ENCOUNTER — Other Ambulatory Visit: Payer: Self-pay | Admitting: *Deleted

## 2018-02-16 ENCOUNTER — Telehealth: Payer: Self-pay | Admitting: Family Medicine

## 2018-02-16 DIAGNOSIS — R55 Syncope and collapse: Secondary | ICD-10-CM | POA: Diagnosis not present

## 2018-02-16 DIAGNOSIS — E1169 Type 2 diabetes mellitus with other specified complication: Secondary | ICD-10-CM | POA: Diagnosis not present

## 2018-02-16 DIAGNOSIS — K449 Diaphragmatic hernia without obstruction or gangrene: Secondary | ICD-10-CM | POA: Diagnosis not present

## 2018-02-16 DIAGNOSIS — D649 Anemia, unspecified: Secondary | ICD-10-CM | POA: Diagnosis not present

## 2018-02-16 DIAGNOSIS — I2510 Atherosclerotic heart disease of native coronary artery without angina pectoris: Secondary | ICD-10-CM | POA: Diagnosis not present

## 2018-02-16 DIAGNOSIS — I110 Hypertensive heart disease with heart failure: Secondary | ICD-10-CM | POA: Diagnosis not present

## 2018-02-16 DIAGNOSIS — K2990 Gastroduodenitis, unspecified, without bleeding: Secondary | ICD-10-CM | POA: Diagnosis not present

## 2018-02-16 DIAGNOSIS — E785 Hyperlipidemia, unspecified: Secondary | ICD-10-CM | POA: Diagnosis not present

## 2018-02-16 DIAGNOSIS — I5032 Chronic diastolic (congestive) heart failure: Secondary | ICD-10-CM | POA: Diagnosis not present

## 2018-02-16 NOTE — Telephone Encounter (Signed)
Called and spoke to pt's caregiver who says pt has ?ingrown toenail and purulent drainage when the toe is touched with "purple/red streaks going down foot" and wanted an antibiotic called in. Advised pt would ntbs and she states he needs it tonight. She states she thinks pt is "running fever" but not sure. Advised to take him to ED or offered appt in the morning and pt's caregiver said "you need to call in antibiotic now, I can't take him to the Urgent Care or ED" pt's caregiver then stated "what good are you if you can't send in antibiotics" and then hung up on me.

## 2018-02-16 NOTE — Patient Outreach (Signed)
[facility]) Care Management  02/16/2018  [name] 1932-05-04 [id number]   Care coordination  [facility] RN CM received a return call from Dr Laurance Flatten about [name] Dr Laurance Flatten reports that presently his office is working on maintaining [name]'s hgb, having him followed by Hematology and neurology. Dr Laurance Flatten reports the patient had been placed on iron tid but he had not been receiving it related to reported constipation issues, BP medications had been stopped. Hematology possibly attempting IV iron. The cardiologist was reported by Dr Laurance Flatten to have request neurology see [name].  [name] may or may not be the actual daughter, ? Grand daughter.    Plan of 24 hour home care, keep hgb maintained, neurology follow up and antidepressant with monitoring   Plan  [facility] RN CM will follow up with [name] and [name] within 10 business days for update. assessment of home care and possible other needs    L. Lavina Hamman, RN, BSN, [place] Coordinator Office number [phone] Mobile number [phone]  Main [facility] number [phone] Fax number [phone]

## 2018-02-17 ENCOUNTER — Encounter (HOSPITAL_COMMUNITY): Payer: Self-pay

## 2018-02-17 ENCOUNTER — Encounter: Payer: Self-pay | Admitting: Physician Assistant

## 2018-02-17 ENCOUNTER — Other Ambulatory Visit: Payer: Self-pay

## 2018-02-17 ENCOUNTER — Ambulatory Visit (INDEPENDENT_AMBULATORY_CARE_PROVIDER_SITE_OTHER): Payer: Medicare HMO | Admitting: Physician Assistant

## 2018-02-17 ENCOUNTER — Inpatient Hospital Stay (HOSPITAL_COMMUNITY): Payer: Medicare HMO

## 2018-02-17 VITALS — BP 92/54 | HR 78 | Temp 97.7°F | Resp 18

## 2018-02-17 VITALS — BP 116/72 | HR 100 | Temp 98.7°F | Ht 67.0 in | Wt 174.6 lb

## 2018-02-17 DIAGNOSIS — Z951 Presence of aortocoronary bypass graft: Secondary | ICD-10-CM | POA: Diagnosis not present

## 2018-02-17 DIAGNOSIS — K644 Residual hemorrhoidal skin tags: Secondary | ICD-10-CM | POA: Diagnosis not present

## 2018-02-17 DIAGNOSIS — E785 Hyperlipidemia, unspecified: Secondary | ICD-10-CM | POA: Diagnosis not present

## 2018-02-17 DIAGNOSIS — B351 Tinea unguium: Secondary | ICD-10-CM

## 2018-02-17 DIAGNOSIS — L03031 Cellulitis of right toe: Secondary | ICD-10-CM | POA: Diagnosis not present

## 2018-02-17 DIAGNOSIS — I2510 Atherosclerotic heart disease of native coronary artery without angina pectoris: Secondary | ICD-10-CM | POA: Diagnosis not present

## 2018-02-17 DIAGNOSIS — E1136 Type 2 diabetes mellitus with diabetic cataract: Secondary | ICD-10-CM | POA: Diagnosis not present

## 2018-02-17 DIAGNOSIS — D509 Iron deficiency anemia, unspecified: Secondary | ICD-10-CM | POA: Diagnosis not present

## 2018-02-17 DIAGNOSIS — K5900 Constipation, unspecified: Secondary | ICD-10-CM | POA: Diagnosis not present

## 2018-02-17 DIAGNOSIS — K5730 Diverticulosis of large intestine without perforation or abscess without bleeding: Secondary | ICD-10-CM | POA: Diagnosis not present

## 2018-02-17 DIAGNOSIS — N400 Enlarged prostate without lower urinary tract symptoms: Secondary | ICD-10-CM | POA: Diagnosis not present

## 2018-02-17 MED ORDER — CEPHALEXIN 500 MG PO CAPS: 500.0000 mg | ORAL_CAPSULE | Freq: Three times a day (TID) | ORAL | 0 refills | Status: DC

## 2018-02-17 MED ORDER — SODIUM CHLORIDE 0.9 % IV SOLN
510.0000 mg | Freq: Once | INTRAVENOUS | Status: AC
  Administered 2018-02-17: 510 mg via INTRAVENOUS
  Filled 2018-02-17: qty 17

## 2018-02-17 MED ORDER — SODIUM CHLORIDE 0.9 % IV SOLN
Freq: Once | INTRAVENOUS | Status: AC
  Administered 2018-02-17: 14:00:00 via INTRAVENOUS

## 2018-02-17 NOTE — Patient Instructions (Signed)
[facility] _______________________________________________________________  Thank you for choosing [facility] at [facility] to provide your oncology and hematology care.  To afford each patient quality time with our providers, please arrive at least 15 minutes before your scheduled appointment.  You need to re-schedule your appointment if you arrive 10 or more minutes late.  We strive to give you quality time with our providers, and arriving late affects you and other patients whose appointments are after yours.  Also, if you no show three or more times for appointments you may be dismissed from the clinic.  Again, thank you for choosing [facility] at [facility]. Our hope is that these requests will allow you access to exceptional care and in a timely manner. _______________________________________________________________  If you have questions after your visit, please contact our office at [phone] between the hours of 8:30 a.m. and 5:00 p.m. Voicemails left after 4:30 p.m. will not be returned until the following business day. _______________________________________________________________  For prescription refill requests, have your pharmacy contact our office. _______________________________________________________________  Recommendations made by the consultant and any test results will be sent to your referring physician. _______________________________________________________________ 

## 2018-02-17 NOTE — Progress Notes (Signed)
Tolerated infusion w/o adverse reaction.  Alert, in no distress.  VSS.  Discharged ambulatory in c/o caregiver.  

## 2018-02-17 NOTE — Progress Notes (Signed)
BP 116/72   Pulse 100   Temp 98.7 F (37.1 C) (Oral)   Ht '5\' 7"'  (1.702 m)   Wt 174 lb 9.6 oz (79.2 kg)   BMI 27.35 kg/m    Subjective:    Patient ID: [name], male    DOB: 1932-09-17, 84 y.o.   MRN: [id number]  HPI: [name] is a 84 y.o. male presenting on 02/17/2018 for infected toe This patient is brought in by his caregiver.  He is a known dementia patient.  He has had some redness and swelling of his right great toe.  They soaked it in Epsom salt over the past couple of days and it has cleared up some but there is still redness.  He has not been any fever, no nausea vomiting or diarrhea.  He does have onychomycosis and has never been to podiatry for this.  Past Medical History:  Diagnosis Date  . Anemia    Iron deficiency  . Aortic stenosis    s/p pericardial AVR  . Aortic valve disorder 06/23/2008   Qualifier: Diagnosis of  By: Mare Ferrari, RMA, Sherri    . Arthritis   . Benign prostatic hypertrophy   . CAD (coronary artery disease) 01/30/2011  . Carotid bruit 08/21/2016  . COLONIC POLYPS 06/23/2008   Qualifier: Diagnosis of  By: Mare Ferrari, RMA, Sherri    . Coronary artery disease    s/p CABG 08/2010    . Diabetes mellitus   . Dyslipidemia 10/15/2017  . Essential hypertension 08/21/2016  . H/O aortic valve replacement 08/21/2016  . Hyperlipidemia   . Hyperlipidemia LDL goal <70 06/23/2008   Qualifier: Diagnosis of  By: Mare Ferrari, RMA, Sherri    . Leg swelling 10/15/2017  . Orthostatic hypotension 06/22/2012  . Palpitations 08/21/2016  . Precordial pain 09/18/2011  . Type 2 diabetes mellitus with hyperlipidemia ([place]) 06/23/2008   Qualifier: Diagnosis of  By: Mare Ferrari, RMA, Sherri    . Unstable angina ([place]) 07/24/2011   Relevant past medical, surgical, family and social history reviewed and updated as indicated. Interim medical history since our last visit reviewed. Allergies and medications reviewed and updated. DATA REVIEWED: CHART IN EPIC  Family History reviewed for  pertinent findings.  Review of Systems  Constitutional: Negative.  Negative for appetite change and fatigue.  Eyes: Negative for pain and visual disturbance.  Respiratory: Negative.  Negative for cough, chest tightness, shortness of breath and wheezing.   Cardiovascular: Negative.  Negative for chest pain, palpitations and leg swelling.  Gastrointestinal: Negative.  Negative for abdominal pain, diarrhea, nausea and vomiting.  Genitourinary: Negative.   Skin: Positive for color change and rash.  Neurological: Negative.  Negative for weakness, numbness and headaches.  Psychiatric/Behavioral: Negative.     Allergies as of 02/17/2018   No Known Allergies     Medication List        Accurate as of 02/17/18  1:15 PM. Always use your most recent med list.          ACCU-CHEK SOFTCLIX LANCETS lancets Use as instructed   aspirin 81 MG tablet Take 1 tablet (81 mg total) by mouth every morning.   blood glucose meter kit and supplies Check BS up to four times daily.Dx E11.9   cephALEXin 500 MG capsule Commonly known as:  KEFLEX Take 1 capsule (500 mg total) by mouth 3 (three) times daily.   escitalopram 20 MG tablet Commonly known as:  LEXAPRO Take 20 mg by mouth daily.   ferrous  sulfate 325 (65 FE) MG tablet Take 1 tablet (325 mg total) by mouth 3 (three) times daily with meals.   furosemide 20 MG tablet Commonly known as:  LASIX Take 1 tablet (20 mg total) by mouth daily as needed.   glucose blood test strip Test BS QD and PRN   metFORMIN 500 MG tablet Commonly known as:  GLUCOPHAGE Take 1 tablet (500 mg total) by mouth 2 (two) times daily.   metoprolol succinate 25 MG 24 hr tablet Commonly known as:  TOPROL-XL Take 0.5 tablets (12.5 mg total) by mouth daily before breakfast.   nitroGLYCERIN 0.4 MG SL tablet Commonly known as:  NITROSTAT Place 1 tablet (0.4 mg total) under the tongue every 5 (five) minutes x 3 doses as needed for chest pain.   pantoprazole 40 MG  tablet Commonly known as:  PROTONIX Take 1 tablet (40 mg total) by mouth 2 (two) times daily before a meal.   polyethylene glycol packet Commonly known as:  MIRALAX / GLYCOLAX Take 17 g by mouth daily.   risperiDONE 0.25 MG tablet Commonly known as:  RISPERDAL Take 1 tablet (0.25 mg total) by mouth 2 (two) times daily.   rosuvastatin 40 MG tablet Commonly known as:  CRESTOR Take 1 tablet (40 mg total) by mouth every evening.          Objective:    BP 116/72   Pulse 100   Temp 98.7 F (37.1 C) (Oral)   Ht '5\' 7"'  (1.702 m)   Wt 174 lb 9.6 oz (79.2 kg)   BMI 27.35 kg/m   No Known Allergies  Wt Readings from Last 3 Encounters:  02/17/18 174 lb 9.6 oz (79.2 kg)  02/12/18 170 lb 1.6 oz (77.2 kg)  02/11/18 165 lb 12.8 oz (75.2 kg)    Physical Exam  Constitutional: He appears well-developed and well-nourished. No distress.  HENT:  Head: Normocephalic and atraumatic.  Eyes: Pupils are equal, round, and reactive to light. Conjunctivae and EOM are normal.  Cardiovascular: Normal rate, regular rhythm and normal heart sounds.  Pulmonary/Chest: Effort normal and breath sounds normal. No respiratory distress.  Musculoskeletal:       Right foot: There is tenderness.       Feet:  Erythema, thickened nail  Skin: Skin is warm and dry.  Psychiatric: He has a normal mood and affect. His behavior is normal.  Nursing note and vitals reviewed.   Results for orders placed or performed in visit on 02/12/18  CBC with Differential/Platelet  Result Value Ref Range   WBC 7.2 4.0 - 10.5 K/uL   RBC 3.60 (L) 4.22 - 5.81 MIL/uL   Hemoglobin 8.6 (L) 13.0 - 17.0 g/dL   HCT 29.4 (L) 39.0 - 52.0 %   MCV 81.7 80.0 - 100.0 fL   MCH 23.9 (L) 26.0 - 34.0 pg   MCHC 29.3 (L) 30.0 - 36.0 g/dL   RDW 20.6 (H) 11.5 - 15.5 %   Platelets 214 150 - 400 K/uL   nRBC 0.0 0.0 - 0.2 %   Neutrophils Relative % 65 %   Neutro Abs 4.7 1.7 - 7.7 K/uL   Lymphocytes Relative 20 %   Lymphs Abs 1.4 0.7 - 4.0  K/uL   Monocytes Relative 10 %   Monocytes Absolute 0.7 0.1 - 1.0 K/uL   Eosinophils Relative 5 %   Eosinophils Absolute 0.3 0.0 - 0.5 K/uL   Basophils Relative 0 %   Basophils Absolute 0.0 0.0 - 0.1 K/uL  Immature Granulocytes 0 %   Abs Immature Granulocytes 0.02 0.00 - 0.07 K/uL  Comprehensive metabolic panel  Result Value Ref Range   Sodium 137 135 - 145 mmol/L   Potassium 4.5 3.5 - 5.1 mmol/L   Chloride 105 98 - 111 mmol/L   CO2 26 22 - 32 mmol/L   Glucose, Bld 106 (H) 70 - 99 mg/dL   BUN 17 8 - 23 mg/dL   Creatinine, Ser 1.20 0.61 - 1.24 mg/dL   Calcium 8.7 (L) 8.9 - 10.3 mg/dL   Total Protein 6.8 6.5 - 8.1 g/dL   Albumin 3.3 (L) 3.5 - 5.0 g/dL   AST 37 15 - 41 U/L   ALT 26 0 - 44 U/L   Alkaline Phosphatase 90 38 - 126 U/L   Total Bilirubin 0.8 0.3 - 1.2 mg/dL   GFR calc non Af Amer 54 (L) >60 mL/min   GFR calc Af Amer >60 >60 mL/min   Anion gap 6 5 - 15  Ferritin  Result Value Ref Range   Ferritin 29 24 - 336 ng/mL  Iron and TIBC  Result Value Ref Range   Iron 34 (L) 45 - 182 ug/dL   TIBC 324 250 - 450 ug/dL   Saturation Ratios 10 (L) 17.9 - 39.5 %   UIBC 290 ug/dL  Vitamin B12  Result Value Ref Range   Vitamin B-12 425 180 - 914 pg/mL  Folate  Result Value Ref Range   Folate 15.8 >5.9 ng/mL  Reticulocytes  Result Value Ref Range   Retic Ct Pct 1.4 0.4 - 3.1 %   RBC. 3.60 (L) 4.22 - 5.81 MIL/uL   Retic Count, Absolute 50.8 19.0 - 186.0 K/uL   Immature Retic Fract 13.4 2.3 - 15.9 %  Lactate dehydrogenase  Result Value Ref Range   LDH 176 98 - 192 U/L  Protein electrophoresis, serum  Result Value Ref Range   Total Protein ELP 6.5 6.0 - 8.5 g/dL   Albumin ELP 3.3 2.9 - 4.4 g/dL   Alpha-1-Globulin 0.2 0.0 - 0.4 g/dL   Alpha-2-Globulin 0.7 0.4 - 1.0 g/dL   Beta Globulin 1.0 0.7 - 1.3 g/dL   Gamma Globulin 1.2 0.4 - 1.8 g/dL   M-Spike, % Not Observed Not Observed g/dL   SPE Interp. Comment    Comment Comment    GLOBULIN, TOTAL 3.2 2.2 - 3.9 g/dL   A/G  Ratio 1.0 0.7 - 1.7  Copper, serum  Result Value Ref Range   Copper 126 72 - 166 ug/dL      Assessment & Plan:   1. Cellulitis of toe of right foot Keflex 500 mg one TID  2. Onychomycosis - Ambulatory referral to Podiatry   Continue all other maintenance medications as listed above.  Follow up plan: Return if symptoms worsen or fail to improve.  Educational handout given for N[place] PA-C [place] [address]  [place], [place] [address] [phone]   02/17/2018, 1:15 PM

## 2018-02-18 ENCOUNTER — Telehealth: Payer: Self-pay

## 2018-02-18 DIAGNOSIS — I2510 Atherosclerotic heart disease of native coronary artery without angina pectoris: Secondary | ICD-10-CM | POA: Diagnosis not present

## 2018-02-18 DIAGNOSIS — R55 Syncope and collapse: Secondary | ICD-10-CM | POA: Diagnosis not present

## 2018-02-18 DIAGNOSIS — L03031 Cellulitis of right toe: Secondary | ICD-10-CM

## 2018-02-18 DIAGNOSIS — E1169 Type 2 diabetes mellitus with other specified complication: Secondary | ICD-10-CM | POA: Diagnosis not present

## 2018-02-18 DIAGNOSIS — K449 Diaphragmatic hernia without obstruction or gangrene: Secondary | ICD-10-CM | POA: Diagnosis not present

## 2018-02-18 DIAGNOSIS — K2990 Gastroduodenitis, unspecified, without bleeding: Secondary | ICD-10-CM | POA: Diagnosis not present

## 2018-02-18 DIAGNOSIS — I110 Hypertensive heart disease with heart failure: Secondary | ICD-10-CM | POA: Diagnosis not present

## 2018-02-18 DIAGNOSIS — E785 Hyperlipidemia, unspecified: Secondary | ICD-10-CM | POA: Diagnosis not present

## 2018-02-18 DIAGNOSIS — D649 Anemia, unspecified: Secondary | ICD-10-CM | POA: Diagnosis not present

## 2018-02-18 DIAGNOSIS — I5032 Chronic diastolic (congestive) heart failure: Secondary | ICD-10-CM | POA: Diagnosis not present

## 2018-02-18 NOTE — Telephone Encounter (Signed)
Please schedule visit with podiatrist as soon as possible in [place]

## 2018-02-18 NOTE — Telephone Encounter (Signed)
Toe no better  Wants to go to a foot Dr.

## 2018-02-23 ENCOUNTER — Telehealth: Payer: Self-pay | Admitting: Family Medicine

## 2018-02-23 ENCOUNTER — Ambulatory Visit: Payer: Self-pay | Admitting: *Deleted

## 2018-02-24 ENCOUNTER — Inpatient Hospital Stay (HOSPITAL_COMMUNITY): Payer: Medicare HMO | Attending: Hematology

## 2018-02-24 ENCOUNTER — Other Ambulatory Visit: Payer: Self-pay | Admitting: *Deleted

## 2018-02-24 ENCOUNTER — Other Ambulatory Visit: Payer: Self-pay | Admitting: Family Medicine

## 2018-02-24 VITALS — BP 97/53 | HR 72 | Temp 97.7°F | Resp 16

## 2018-02-24 DIAGNOSIS — D509 Iron deficiency anemia, unspecified: Secondary | ICD-10-CM | POA: Diagnosis not present

## 2018-02-24 MED ORDER — SODIUM CHLORIDE 0.9 % IV SOLN
510.0000 mg | Freq: Once | INTRAVENOUS | Status: AC
  Administered 2018-02-24: 510 mg via INTRAVENOUS
  Filled 2018-02-24: qty 17

## 2018-02-24 MED ORDER — SODIUM CHLORIDE 0.9 % IV SOLN
Freq: Once | INTRAVENOUS | Status: AC
  Administered 2018-02-24: 14:00:00 via INTRAVENOUS

## 2018-02-24 NOTE — Patient Outreach (Signed)
[facility]) Care Management  02/24/2018  [name] [birth year] [id number]   Care coordination  Outreach attempt # 1 to follow up with new home tx plan of extra pcs   [facility] RN CM left HIPAA compliant voicemail message along with CM's contact info.   Plan:  [facility] RN CM scheduled this patient for another call attempt within 4 business days  Kimberly L. Lavina Hamman, RN, BSN, [place] Coordinator Office number [phone] Mobile number [phone]  Main [facility] number [phone] Fax number [phone]

## 2018-02-25 ENCOUNTER — Ambulatory Visit: Payer: Self-pay | Admitting: *Deleted

## 2018-02-25 DIAGNOSIS — E785 Hyperlipidemia, unspecified: Secondary | ICD-10-CM | POA: Diagnosis not present

## 2018-02-25 DIAGNOSIS — R55 Syncope and collapse: Secondary | ICD-10-CM | POA: Diagnosis not present

## 2018-02-25 DIAGNOSIS — K449 Diaphragmatic hernia without obstruction or gangrene: Secondary | ICD-10-CM | POA: Diagnosis not present

## 2018-02-25 DIAGNOSIS — I5032 Chronic diastolic (congestive) heart failure: Secondary | ICD-10-CM | POA: Diagnosis not present

## 2018-02-25 DIAGNOSIS — D649 Anemia, unspecified: Secondary | ICD-10-CM | POA: Diagnosis not present

## 2018-02-25 DIAGNOSIS — E1169 Type 2 diabetes mellitus with other specified complication: Secondary | ICD-10-CM | POA: Diagnosis not present

## 2018-02-25 DIAGNOSIS — I110 Hypertensive heart disease with heart failure: Secondary | ICD-10-CM | POA: Diagnosis not present

## 2018-02-25 DIAGNOSIS — I2510 Atherosclerotic heart disease of native coronary artery without angina pectoris: Secondary | ICD-10-CM | POA: Diagnosis not present

## 2018-02-25 DIAGNOSIS — K2990 Gastroduodenitis, unspecified, without bleeding: Secondary | ICD-10-CM | POA: Diagnosis not present

## 2018-02-25 NOTE — Progress Notes (Signed)
Iron given per orders. Patient tolerated it well without problems. Vitals stable and discharged home from clinic ambulatory. Follow up as scheduled.  

## 2018-02-26 ENCOUNTER — Other Ambulatory Visit: Payer: Self-pay | Admitting: *Deleted

## 2018-02-26 ENCOUNTER — Ambulatory Visit (HOSPITAL_COMMUNITY): Payer: Medicare HMO

## 2018-02-26 NOTE — Patient Outreach (Addendum)
[facility]) Care Management  02/26/2018  DARONTE SHOSTAK 10-May-1932 [id number]   Care coordination/case closure   [facility] RN CM called to speak with [name] who confirms since the implementation of the extra evening sitter Mr Gidney is doing "much better.  He is eating good, walking better and more active."  She reports no falls [name] confirms she is pleased with his progress and improvements He is noted to be receiving iron infusions and tolerating them well for anemia CM notes his caregiver has called to MD office for a in grown toe concern and he is to be scheduled to see a podiatrist. The MD's NP mentions in her note that Mr Wohler is a known dementia patient She states she will contact [facility] RN CM or other staff if other concerns occur  CM discussed case closure at this time and she agrees  Plan case closure as Mr Bossman has noted improvements  Updated [facility] SW on pt improvements Routed note to MD  Colbert. Lavina Hamman, RN, BSN, [place] Coordinator Office number [phone] Mobile number [phone]  Main [facility] number [phone] Fax number [phone]

## 2018-03-03 ENCOUNTER — Telehealth: Payer: Self-pay | Admitting: Family Medicine

## 2018-03-03 NOTE — Telephone Encounter (Signed)
Spoke to pt's daughter and she states pt is becoming more agitated and threatening to "beat the caregivers but" also getting very upset when they don't let him drive. He actually got the keys on Thanksgiving and drove without anyone realizing and was almost in [place] in a cemetary when someone found him. Daughter denies any urinary symptoms that she knows of. Is there anything that can be called in? Advised daughter he would probably ntbs for evaluation and to discuss any medication changes.  Please advise

## 2018-03-03 NOTE — Telephone Encounter (Signed)
Pt scheduled for 04/02/18 at 1:45 with [facility].

## 2018-03-03 NOTE — Telephone Encounter (Signed)
Patient needs to see dr. Laurance Flatten

## 2018-03-16 ENCOUNTER — Telehealth: Payer: Self-pay | Admitting: Family Medicine

## 2018-03-16 NOTE — Telephone Encounter (Signed)
PT caregiver Gwinda Passe has called stating that the pt BP has been up last couple of days and wants to talk to nurse about it, BP this morning was 131/80, yesterday around noon was 146/81, and saturday it was 142/79

## 2018-03-16 NOTE — Telephone Encounter (Signed)
Yes he should go back on it if was not stopped for any particular reason other then blood pressure was doing good.

## 2018-03-16 NOTE — Telephone Encounter (Signed)
Pt's BP has been running a little higher since he stopped taking the Lisinopril 5 mg in November [id number] for systolic and 40B for diastolic. Should he go back on the lisinopril? Please advise. Pt does have appt with [facility] 04/02/18 at 1:45.

## 2018-03-20 NOTE — Telephone Encounter (Signed)
Spoke with caregiver, she reports [name]'s Lisinopril had been discontinued because his blood pressure was running low.  She states that the last couple of days his blood pressure has been normal, running in the 130/70's.  I advised her to monitor his blood pressure and contact us if he had any changes and it became elevated.

## 2018-03-31 ENCOUNTER — Other Ambulatory Visit: Payer: Self-pay | Admitting: Family Medicine

## 2018-04-02 ENCOUNTER — Other Ambulatory Visit (HOSPITAL_COMMUNITY): Payer: Self-pay | Admitting: Emergency Medicine

## 2018-04-02 ENCOUNTER — Encounter: Payer: Self-pay | Admitting: Family Medicine

## 2018-04-02 ENCOUNTER — Ambulatory Visit (INDEPENDENT_AMBULATORY_CARE_PROVIDER_SITE_OTHER): Payer: Medicare HMO | Admitting: Family Medicine

## 2018-04-02 VITALS — BP 136/67 | HR 64 | Temp 96.9°F | Ht 67.0 in | Wt 176.0 lb

## 2018-04-02 DIAGNOSIS — R531 Weakness: Secondary | ICD-10-CM | POA: Diagnosis not present

## 2018-04-02 DIAGNOSIS — D508 Other iron deficiency anemias: Secondary | ICD-10-CM

## 2018-04-02 DIAGNOSIS — F329 Major depressive disorder, single episode, unspecified: Secondary | ICD-10-CM | POA: Diagnosis not present

## 2018-04-02 DIAGNOSIS — R2681 Unsteadiness on feet: Secondary | ICD-10-CM | POA: Diagnosis not present

## 2018-04-02 DIAGNOSIS — I69998 Other sequelae following unspecified cerebrovascular disease: Secondary | ICD-10-CM

## 2018-04-02 DIAGNOSIS — F32A Depression, unspecified: Secondary | ICD-10-CM

## 2018-04-02 DIAGNOSIS — R451 Restlessness and agitation: Secondary | ICD-10-CM

## 2018-04-02 DIAGNOSIS — D509 Iron deficiency anemia, unspecified: Secondary | ICD-10-CM

## 2018-04-02 DIAGNOSIS — R609 Edema, unspecified: Secondary | ICD-10-CM | POA: Diagnosis not present

## 2018-04-02 DIAGNOSIS — W19XXXS Unspecified fall, sequela: Secondary | ICD-10-CM

## 2018-04-02 DIAGNOSIS — R296 Repeated falls: Secondary | ICD-10-CM | POA: Diagnosis not present

## 2018-04-02 MED ORDER — RISPERIDONE 0.25 MG PO TABS: 0.2500 mg | ORAL_TABLET | Freq: Three times a day (TID) | ORAL | 3 refills | Status: DC

## 2018-04-02 MED ORDER — FUROSEMIDE 20 MG PO TABS: ORAL_TABLET | ORAL | 3 refills | Status: DC

## 2018-04-02 NOTE — Progress Notes (Signed)
Subjective:    Patient ID: [name], male    DOB: 04-24-1932, 85 y.o.   MRN: [id number]  HPI Patient here today for follow up on medications.  The patient is here today with his caregiver and another person.  He is planning to get an iron infusion tomorrow because he had trouble absorbing oral iron.  He is on Risperdal and this seems to be helping some but not continuously and he is on Risperdal 0.25 twice daily currently.  Sometimes his behavior is aggressive and intolerable.  He stays by himself at night.  He has been driving.  He is doing better with his eating.  He does have some edema.  His vital signs are stable today and weight is up 1 pound.  On initially meeting the person today seems to be pretty reasonable and listening.  We did encourage him that he was not to be driving that he is got to take time to get his strength back from the multiple falls and hospitalizations he had has had recently.  He seems to listen and says he will not drive until we can get more strength back on him.  We may go ahead and set him up for some physical therapy to evaluate and treat because of weakness in general.  For his agitated bowel movements we will go ahead and increase his Risperdal to 3 times a day.  Today he denies any chest pain pressure tightness or shortness of breath.  He denies any trouble with his intestinal system including nausea vomiting diarrhea blood in the stool or black tarry bowel movements.  He is passing his water without problems.  He did describe to me how he almost fell just yesterday stepping up on a curb indicating he is still weak from his several month sickness.  He did promise me he would not doing more driving until he got more strength and got his hemoglobin up.  His eating has improved.    Patient Active Problem List   Diagnosis Date Noted  . Cerebrovascular accident (CVA) ([place]) 02/11/2018  . Dementia without behavioral disturbance ([place]) 01/27/2018  . Falls frequently  01/27/2018  . DNR (do not resuscitate) 01/27/2018  . Diabetes mellitus due to underlying condition with diabetic autonomic neuropathy, without long-term current use of insulin ([place]) 01/21/2018  . Depression 12/26/2017  . Sepsis ([place]) 12/26/2017  . Syncope   . Syncope and collapse   . Fall   . Microcytic anemia 12/25/2017  . Dyslipidemia 10/15/2017  . Leg swelling 10/15/2017  . Palpitations 08/21/2016  . Essential hypertension 08/21/2016  . Carotid bruit 08/21/2016  . H/O aortic valve replacement 08/21/2016  . Anemia, iron deficiency 04/11/2014  . Orthostatic hypotension 06/22/2012  . Dizzy 06/22/2012  . Precordial pain 09/18/2011  . Unstable angina ([place]) 07/24/2011  . CAD (coronary artery disease) 01/30/2011  . COLONIC POLYPS 06/23/2008  . Type 2 diabetes mellitus with hyperlipidemia ([place]) 06/23/2008  . Hyperlipidemia LDL goal <70 06/23/2008  . Aortic valve disorder 06/23/2008  . BENIGN PROSTATIC HYPERTROPHY, HX OF 06/23/2008   Outpatient Encounter Medications as of 04/02/2018  Medication Sig  . ACCU-CHEK SOFTCLIX LANCETS lancets Use as instructed  . aspirin 81 MG tablet Take 1 tablet (81 mg total) by mouth every morning.  . blood glucose meter kit and supplies Check BS up to four times daily.Dx E11.9  . escitalopram (LEXAPRO) 20 MG tablet Take 20 mg by mouth daily.  . furosemide (LASIX) 20 MG tablet Take 1 tablet (  20 mg total) by mouth daily as needed.  Marland Kitchen glucose blood test strip CHECK BLOOD SUGAR UP TO 4 TIMES A DAY  . metFORMIN (GLUCOPHAGE) 500 MG tablet Take 1 tablet (500 mg total) by mouth 2 (two) times daily.  . metoprolol succinate (TOPROL-XL) 25 MG 24 hr tablet Take 0.5 tablets (12.5 mg total) by mouth daily before breakfast.  . pantoprazole (PROTONIX) 40 MG tablet TAKE (1) TABLET TWICE A DAY BEFOR A MEAL  . polyethylene glycol (MIRALAX / GLYCOLAX) packet Take 17 g by mouth daily.  . risperiDONE (RISPERDAL) 0.25 MG tablet Take 1 tablet (0.25 mg total) by mouth 2 (two)  times daily.  . rosuvastatin (CRESTOR) 40 MG tablet Take 1 tablet (40 mg total) by mouth every evening.  . nitroGLYCERIN (NITROSTAT) 0.4 MG SL tablet Place 1 tablet (0.4 mg total) under the tongue every 5 (five) minutes x 3 doses as needed for chest pain. (Patient not taking: Reported on 04/02/2018)  . [DISCONTINUED] cephALEXin (KEFLEX) 500 MG capsule Take 1 capsule (500 mg total) by mouth 3 (three) times daily.  . [DISCONTINUED] ferrous sulfate 325 (65 FE) MG tablet Take 1 tablet (325 mg total) by mouth 3 (three) times daily with meals.   No facility-administered encounter medications on file as of 04/02/2018.      Review of Systems  Constitutional: Negative.        Not sleeping well   HENT: Negative.   Eyes: Negative.   Respiratory: Negative.   Cardiovascular: Positive for leg swelling.  Gastrointestinal: Negative.   Endocrine: Negative.   Genitourinary: Negative.   Musculoskeletal: Negative.   Skin: Negative.   Allergic/Immunologic: Negative.   Neurological: Negative.   Hematological: Negative.   Psychiatric/Behavioral: Positive for agitation.       Depressed       Objective:   Physical Exam Vitals signs and nursing note reviewed.  Constitutional:      Appearance: Normal appearance. He is well-developed. He is obese. He is not ill-appearing.  HENT:     Head: Normocephalic and atraumatic.     Right Ear: Tympanic membrane, ear canal and external ear normal. There is no impacted cerumen.     Left Ear: Tympanic membrane, ear canal and external ear normal. There is no impacted cerumen.     Nose: Nose normal. No congestion.     Mouth/Throat:     Mouth: Mucous membranes are moist.     Pharynx: Oropharynx is clear. No oropharyngeal exudate.  Eyes:     General: No scleral icterus.       Right eye: No discharge.        Left eye: No discharge.     Extraocular Movements: Extraocular movements intact.     Conjunctiva/sclera: Conjunctivae normal.     Pupils: Pupils are equal, round,  and reactive to light.  Neck:     Musculoskeletal: Normal range of motion and neck supple. No neck rigidity.     Thyroid: No thyromegaly.     Trachea: No tracheal deviation.  Cardiovascular:     Rate and Rhythm: Normal rate and regular rhythm.     Heart sounds: Normal heart sounds. No murmur.     Comments: Heart is regular at 72/min he has 2+ pitting lower extremity edema. Pulmonary:     Effort: Pulmonary effort is normal. No respiratory distress.     Breath sounds: Normal breath sounds. No wheezing or rales.     Comments: Lungs are clear anteriorly and posteriorly and no rales or rhonchi.  Abdominal:     General: Bowel sounds are normal.     Palpations: Abdomen is soft. There is no mass.     Tenderness: There is no abdominal tenderness. There is no guarding.  Musculoskeletal: Normal range of motion.        General: No tenderness.     Right lower leg: Edema present.     Left lower leg: Edema present.     Comments: Patient appears to still be weak with standing and moving.  Lymphadenopathy:     Cervical: No cervical adenopathy.  Skin:    General: Skin is warm and dry.     Findings: No rash.  Neurological:     General: No focal deficit present.     Mental Status: He is alert and oriented to person, place, and time. Mental status is at baseline.     Cranial Nerves: No cranial nerve deficit.     Motor: Weakness present.     Deep Tendon Reflexes: Reflexes are normal and symmetric.  Psychiatric:        Behavior: Behavior normal.        Thought Content: Thought content normal.        Judgment: Judgment normal.     Comments: Mood is somewhat flat but may be improving as he is slowly getting his strength back.    BP 136/67 (BP Location: Left Arm)   Pulse 64   Temp (!) 96.9 F (36.1 C) (Oral)   Ht _0  (1.702 m)   Wt 176 lb (79.8 kg)   BMI 27.57 kg/m         Assessment & Plan:  1. Agitation -We will increase Risperdal to 0.253 times daily  2. Weakness due to old  stroke -Arrange for physical therapy next-door to evaluate and treat following prolonged illness due to muscle weakness and gait instability  3. Frequent falls -Physical therapy  4. Depression, unspecified depression type -Increase Risperdal 2.253 times daily  5. Edema, unspecified type -Increase Lasix to 40 mg on Monday Wednesday and Friday and 20 all the other days.  No orders of the defined types were placed in this encounter.  Patient Instructions  Continue to watch sodium intake Elevate legs when sitting in the house No more driving until strength is improved We will arrange for physical therapy to evaluate and treat generalized weakness following prolonged illness We will increase the Risperdal 2.25  3 times daily We will ask you to come back to see [place] in about 4 weeks We will call with lab work results including BMP and CBC and BNP as soon as that is returned Get iron infusion as planned and follow-up with hematology as directed by them Reduce sugar in diet as much as possible   Arrie Senate MD

## 2018-04-02 NOTE — Patient Instructions (Addendum)
Continue to watch sodium intake Elevate legs when sitting in the house No more driving until strength is improved We will arrange for physical therapy to evaluate and treat generalized weakness following prolonged illness We will increase the Risperdal 2.25  3 times daily We will ask you to come back to see [place] in about 4 weeks We will call with lab work results including BMP and CBC and BNP as soon as that is returned Get iron infusion as planned and follow-up with hematology as directed by them Reduce sugar in diet as much as possible

## 2018-04-03 ENCOUNTER — Inpatient Hospital Stay (HOSPITAL_COMMUNITY): Payer: Medicare HMO | Attending: Hematology

## 2018-04-03 ENCOUNTER — Telehealth: Payer: Self-pay | Admitting: Family Medicine

## 2018-04-03 ENCOUNTER — Encounter: Payer: Self-pay | Admitting: *Deleted

## 2018-04-03 DIAGNOSIS — D509 Iron deficiency anemia, unspecified: Secondary | ICD-10-CM

## 2018-04-03 LAB — CBC WITH DIFFERENTIAL/PLATELET
Abs Immature Granulocytes: 0.02 10*3/uL (ref 0.00–0.07)
Basophils Absolute: 0.1 10*3/uL (ref 0.0–0.2)
Basophils Absolute: 0 10*3/uL (ref 0.0–0.1)
Basophils Relative: 1 %
Basos: 1 %
EOS (ABSOLUTE): 0.3 10*3/uL (ref 0.0–0.4)
EOS PCT: 4 %
Eos: 5 %
Eosinophils Absolute: 0.3 10*3/uL (ref 0.0–0.5)
HCT: 30.7 % — ABNORMAL LOW (ref 39.0–52.0)
HEMOGLOBIN: 9.1 g/dL — AB (ref 13.0–17.7)
Hematocrit: 28.6 % — ABNORMAL LOW (ref 37.5–51.0)
Hemoglobin: 9.6 g/dL — ABNORMAL LOW (ref 13.0–17.0)
Immature Grans (Abs): 0 10*3/uL (ref 0.0–0.1)
Immature Granulocytes: 0 %
Immature Granulocytes: 0 %
LYMPHS PCT: 26 %
Lymphocytes Absolute: 1.6 10*3/uL (ref 0.7–3.1)
Lymphs Abs: 2 10*3/uL (ref 0.7–4.0)
Lymphs: 24 %
MCH: 28.3 pg (ref 26.6–33.0)
MCH: 28.8 pg (ref 26.0–34.0)
MCHC: 31.8 g/dL (ref 31.5–35.7)
MCHC: 31.3 g/dL (ref 30.0–36.0)
MCV: 89 fL (ref 79–97)
MCV: 92.2 fL (ref 80.0–100.0)
MONOCYTES: 8 %
Monocytes Absolute: 0.6 10*3/uL (ref 0.1–0.9)
Monocytes Absolute: 0.8 10*3/uL (ref 0.1–1.0)
Monocytes Relative: 10 %
Neutro Abs: 4.6 10*3/uL (ref 1.7–7.7)
Neutrophils Absolute: 4 10*3/uL (ref 1.4–7.0)
Neutrophils Relative %: 59 %
Neutrophils: 62 %
Platelets: 222 10*3/uL (ref 150–450)
Platelets: 215 10*3/uL (ref 150–400)
RBC: 3.21 x10E6/uL — ABNORMAL LOW (ref 4.14–5.80)
RBC: 3.33 MIL/uL — ABNORMAL LOW (ref 4.22–5.81)
RDW: 16.5 % — ABNORMAL HIGH (ref 11.6–15.4)
RDW: 17.1 % — ABNORMAL HIGH (ref 11.5–15.5)
WBC: 6.6 10*3/uL (ref 3.4–10.8)
WBC: 7.7 10*3/uL (ref 4.0–10.5)
nRBC: 0 % (ref 0.0–0.2)

## 2018-04-03 LAB — COMPREHENSIVE METABOLIC PANEL
ALK PHOS: 84 U/L (ref 38–126)
ALT: 23 U/L (ref 0–44)
ANION GAP: 10 (ref 5–15)
AST: 30 U/L (ref 15–41)
Albumin: 3.7 g/dL (ref 3.5–5.0)
BUN: 21 mg/dL (ref 8–23)
CO2: 25 mmol/L (ref 22–32)
Calcium: 9.1 mg/dL (ref 8.9–10.3)
Chloride: 104 mmol/L (ref 98–111)
Creatinine, Ser: 1.09 mg/dL (ref 0.61–1.24)
GFR calc Af Amer: >60 mL/min (ref >60)
GFR calc non Af Amer: >60 mL/min (ref >60)
Glucose, Bld: 134 mg/dL — ABNORMAL HIGH (ref 70–99)
Potassium: 4.4 mmol/L (ref 3.5–5.1)
Sodium: 139 mmol/L (ref 135–145)
Total Bilirubin: 0.6 mg/dL (ref 0.3–1.2)
Total Protein: 6.8 g/dL (ref 6.5–8.1)

## 2018-04-03 LAB — BMP8+EGFR
BUN/Creatinine Ratio: 19 (ref 10–24)
BUN: 20 mg/dL (ref 8–27)
CO2: 25 mmol/L (ref 20–29)
CREATININE: 1.06 mg/dL (ref 0.76–1.27)
Calcium: 9.4 mg/dL (ref 8.6–10.2)
Chloride: 103 mmol/L (ref 96–106)
GFR calc Af Amer: 75 mL/min/{1.73_m2} (ref >59)
GFR, EST NON AFRICAN AMERICAN: 65 mL/min/{1.73_m2} (ref >59)
Glucose: 104 mg/dL — ABNORMAL HIGH (ref 65–99)
Potassium: 4.9 mmol/L (ref 3.5–5.2)
Sodium: 141 mmol/L (ref 134–144)

## 2018-04-03 LAB — IRON AND TIBC
Iron: 42 ug/dL — ABNORMAL LOW (ref 45–182)
Saturation Ratios: 14 % — ABNORMAL LOW (ref 17.9–39.5)
TIBC: 298 ug/dL (ref 250–450)
UIBC: 256 ug/dL

## 2018-04-03 LAB — BRAIN NATRIURETIC PEPTIDE: BNP: 211.1 pg/mL — ABNORMAL HIGH (ref 0.0–100.0)

## 2018-04-03 LAB — FERRITIN: Ferritin: 37 ng/mL (ref 24–336)

## 2018-04-03 NOTE — Telephone Encounter (Signed)
Spoke with [name] - letter written and up front - aware

## 2018-04-06 ENCOUNTER — Other Ambulatory Visit: Payer: Self-pay

## 2018-04-06 ENCOUNTER — Emergency Department (HOSPITAL_COMMUNITY)
Admission: EM | Admit: 2018-04-06 | Discharge: 2018-04-07 | Disposition: A | Discharge status: Home / Self Care | Payer: Medicare HMO | Attending: Emergency Medicine | Admitting: Emergency Medicine

## 2018-04-06 ENCOUNTER — Encounter (HOSPITAL_COMMUNITY): Payer: Self-pay | Admitting: Emergency Medicine

## 2018-04-06 ENCOUNTER — Telehealth: Payer: Self-pay | Admitting: *Deleted

## 2018-04-06 DIAGNOSIS — Z87891 Personal history of nicotine dependence: Secondary | ICD-10-CM | POA: Insufficient documentation

## 2018-04-06 DIAGNOSIS — Z954 Presence of other heart-valve replacement: Secondary | ICD-10-CM | POA: Insufficient documentation

## 2018-04-06 DIAGNOSIS — E119 Type 2 diabetes mellitus without complications: Secondary | ICD-10-CM | POA: Insufficient documentation

## 2018-04-06 DIAGNOSIS — I2510 Atherosclerotic heart disease of native coronary artery without angina pectoris: Secondary | ICD-10-CM | POA: Insufficient documentation

## 2018-04-06 DIAGNOSIS — F0390 Unspecified dementia without behavioral disturbance: Secondary | ICD-10-CM | POA: Insufficient documentation

## 2018-04-06 DIAGNOSIS — F329 Major depressive disorder, single episode, unspecified: Secondary | ICD-10-CM | POA: Insufficient documentation

## 2018-04-06 DIAGNOSIS — Z955 Presence of coronary angioplasty implant and graft: Secondary | ICD-10-CM | POA: Insufficient documentation

## 2018-04-06 DIAGNOSIS — R451 Restlessness and agitation: Secondary | ICD-10-CM | POA: Insufficient documentation

## 2018-04-06 DIAGNOSIS — Z8673 Personal history of transient ischemic attack (TIA), and cerebral infarction without residual deficits: Secondary | ICD-10-CM | POA: Insufficient documentation

## 2018-04-06 LAB — CBC WITH DIFFERENTIAL/PLATELET
ABS IMMATURE GRANULOCYTES: 0.02 10*3/uL (ref 0.00–0.07)
Basophils Absolute: 0 10*3/uL (ref 0.0–0.1)
Basophils Relative: 1 %
Eosinophils Absolute: 0.3 10*3/uL (ref 0.0–0.5)
Eosinophils Relative: 4 %
HCT: 27.6 % — ABNORMAL LOW (ref 39.0–52.0)
Hemoglobin: 8.4 g/dL — ABNORMAL LOW (ref 13.0–17.0)
Immature Granulocytes: 0 %
Lymphocytes Relative: 21 %
Lymphs Abs: 1.3 10*3/uL (ref 0.7–4.0)
MCH: 28 pg (ref 26.0–34.0)
MCHC: 30.4 g/dL (ref 30.0–36.0)
MCV: 92 fL (ref 80.0–100.0)
Monocytes Absolute: 0.7 10*3/uL (ref 0.1–1.0)
Monocytes Relative: 11 %
Neutro Abs: 4.1 10*3/uL (ref 1.7–7.7)
Neutrophils Relative %: 63 %
Platelets: 194 10*3/uL (ref 150–400)
RBC: 3 MIL/uL — ABNORMAL LOW (ref 4.22–5.81)
RDW: 16.2 % — ABNORMAL HIGH (ref 11.5–15.5)
WBC: 6.4 10*3/uL (ref 4.0–10.5)
nRBC: 0 % (ref 0.0–0.2)

## 2018-04-06 LAB — LIPASE, BLOOD: Lipase: 53 U/L — ABNORMAL HIGH (ref 11–51)

## 2018-04-06 LAB — COMPREHENSIVE METABOLIC PANEL
ALT: 22 U/L (ref 0–44)
AST: 26 U/L (ref 15–41)
Albumin: 3.5 g/dL (ref 3.5–5.0)
Alkaline Phosphatase: 76 U/L (ref 38–126)
Anion gap: 7 (ref 5–15)
BILIRUBIN TOTAL: 0.6 mg/dL (ref 0.3–1.2)
BUN: 22 mg/dL (ref 8–23)
CALCIUM: 8.5 mg/dL — AB (ref 8.9–10.3)
CO2: 26 mmol/L (ref 22–32)
Chloride: 103 mmol/L (ref 98–111)
Creatinine, Ser: 1.13 mg/dL (ref 0.61–1.24)
GFR calc Af Amer: >60 mL/min (ref >60)
GFR, EST NON AFRICAN AMERICAN: 60 mL/min — AB (ref >60)
Glucose, Bld: 122 mg/dL — ABNORMAL HIGH (ref 70–99)
Potassium: 3.9 mmol/L (ref 3.5–5.1)
Sodium: 136 mmol/L (ref 135–145)
Total Protein: 6.4 g/dL — ABNORMAL LOW (ref 6.5–8.1)

## 2018-04-06 LAB — ACETAMINOPHEN LEVEL: Acetaminophen (Tylenol), Serum: <10 ug/mL — ABNORMAL LOW (ref 10–30)

## 2018-04-06 LAB — SALICYLATE LEVEL: Salicylate Lvl: <7 mg/dL (ref 2.8–30.0)

## 2018-04-06 LAB — ETHANOL: Alcohol, Ethyl (B): <10 mg/dL (ref <10)

## 2018-04-06 MED ORDER — METOPROLOL SUCCINATE ER 25 MG PO TB24
12.5000 mg | ORAL_TABLET | Freq: Every day | ORAL | Status: DC
  Administered 2018-04-07: 12.5 mg via ORAL
  Filled 2018-04-06: qty 1

## 2018-04-06 MED ORDER — ASPIRIN 81 MG PO CHEW
81.0000 mg | CHEWABLE_TABLET | Freq: Every day | ORAL | Status: DC
  Administered 2018-04-07: 81 mg via ORAL
  Filled 2018-04-06: qty 1

## 2018-04-06 MED ORDER — ACETAMINOPHEN 325 MG PO TABS: 650.0000 mg | ORAL_TABLET | ORAL | Status: DC | PRN

## 2018-04-06 MED ORDER — ROSUVASTATIN CALCIUM 40 MG PO TABS
40.0000 mg | ORAL_TABLET | Freq: Every evening | ORAL | Status: DC
  Filled 2018-04-06: qty 1

## 2018-04-06 MED ORDER — POLYETHYLENE GLYCOL 3350 17 G PO PACK
17.0000 g | PACK | Freq: Every day | ORAL | Status: DC
  Administered 2018-04-07: 17 g via ORAL
  Filled 2018-04-06: qty 1

## 2018-04-06 MED ORDER — RISPERIDONE 0.25 MG PO TABS
0.2500 mg | ORAL_TABLET | Freq: Three times a day (TID) | ORAL | Status: DC
  Administered 2018-04-07: 0.25 mg via ORAL
  Filled 2018-04-06 (×5): qty 1

## 2018-04-06 MED ORDER — PANTOPRAZOLE SODIUM 40 MG PO TBEC
20.0000 mg | DELAYED_RELEASE_TABLET | Freq: Every day | ORAL | Status: DC
  Administered 2018-04-07: 40 mg via ORAL
  Filled 2018-04-06 (×3): qty 1

## 2018-04-06 MED ORDER — METFORMIN HCL 500 MG PO TABS
500.0000 mg | ORAL_TABLET | Freq: Two times a day (BID) | ORAL | Status: DC
  Administered 2018-04-07: 500 mg via ORAL
  Filled 2018-04-06: qty 1

## 2018-04-06 MED ORDER — ESCITALOPRAM OXALATE 10 MG PO TABS
10.0000 mg | ORAL_TABLET | Freq: Every day | ORAL | Status: DC
  Administered 2018-04-07: 10 mg via ORAL
  Filled 2018-04-06: qty 1
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
## 2018-04-06 NOTE — Telephone Encounter (Signed)
[name] has called the [facility] office and they will pick him up and transport him to [facility] for a Psych eval. She is worried about him hurting himself or someone else.   Just FYI - you should see the notes come through once he arrives there.

## 2018-04-07 ENCOUNTER — Telehealth: Payer: Self-pay | Admitting: Family Medicine

## 2018-04-07 ENCOUNTER — Encounter (HOSPITAL_COMMUNITY): Payer: Self-pay | Admitting: Registered Nurse

## 2018-04-07 LAB — URINALYSIS, ROUTINE W REFLEX MICROSCOPIC
Bilirubin Urine: NEGATIVE
Glucose, UA: NEGATIVE mg/dL
Hgb urine dipstick: NEGATIVE
Ketones, ur: NEGATIVE mg/dL
Leukocytes, UA: NEGATIVE
Nitrite: NEGATIVE
Protein, ur: NEGATIVE mg/dL
SPECIFIC GRAVITY, URINE: 1.017 (ref 1.005–1.030)
pH: 6 (ref 5.0–8.0)

## 2018-04-07 MED ORDER — RISPERIDONE 0.5 MG PO TABS
ORAL_TABLET | ORAL | Status: AC
  Filled 2018-04-07: qty 1
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
## 2018-04-07 NOTE — ED Notes (Signed)
TTS in progress. Daughter at bedside at this time.

## 2018-04-07 NOTE — ED Notes (Signed)
Family at bedside. 

## 2018-04-07 NOTE — Discharge Instructions (Addendum)
If there is any fever, vomiting, headache, confusion, agitation, or any other new/concerning symptoms then return to the ER for evaluation.

## 2018-04-07 NOTE — ED Notes (Signed)
Pt given lunch tray.
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
## 2018-04-07 NOTE — ED Notes (Addendum)
Lindon Romp, NP, recommends overnight observation for stabilization and safety with psych re-evaluation in morning. Attempted contact with [name], patients daughter and petitioner, attempt made, no one answered. Legrand Como, RN, informed of disposition.

## 2018-04-07 NOTE — ED Notes (Signed)
Pt given breakfast tray

## 2018-04-07 NOTE — ED Notes (Signed)
TTS machine placed at bedside.   

## 2018-04-07 NOTE — ED Notes (Signed)
IVC resended at this time by Dr. Regenia Skeeter and pt d/c to home.

## 2018-04-08 NOTE — Telephone Encounter (Signed)
FL2 filled out, placed on providers desk

## 2018-04-09 ENCOUNTER — Telehealth: Payer: Self-pay | Admitting: Family Medicine

## 2018-04-09 NOTE — Telephone Encounter (Signed)
appt 04/13/18 at 3:25 . Christy aware

## 2018-04-10 ENCOUNTER — Other Ambulatory Visit: Payer: Self-pay

## 2018-04-10 ENCOUNTER — Inpatient Hospital Stay (HOSPITAL_COMMUNITY): Payer: Medicare HMO | Attending: Hematology | Admitting: Hematology

## 2018-04-10 ENCOUNTER — Encounter (HOSPITAL_COMMUNITY): Payer: Self-pay | Admitting: Hematology

## 2018-04-10 VITALS — BP 110/51 | HR 85 | Temp 97.6°F | Resp 16 | Wt 171.0 lb

## 2018-04-10 DIAGNOSIS — R7989 Other specified abnormal findings of blood chemistry: Secondary | ICD-10-CM | POA: Diagnosis not present

## 2018-04-10 DIAGNOSIS — Z87891 Personal history of nicotine dependence: Secondary | ICD-10-CM | POA: Diagnosis not present

## 2018-04-10 DIAGNOSIS — Z951 Presence of aortocoronary bypass graft: Secondary | ICD-10-CM | POA: Diagnosis not present

## 2018-04-10 DIAGNOSIS — Z7982 Long term (current) use of aspirin: Secondary | ICD-10-CM | POA: Diagnosis not present

## 2018-04-10 DIAGNOSIS — E119 Type 2 diabetes mellitus without complications: Secondary | ICD-10-CM

## 2018-04-10 DIAGNOSIS — Z952 Presence of prosthetic heart valve: Secondary | ICD-10-CM | POA: Diagnosis not present

## 2018-04-10 DIAGNOSIS — I10 Essential (primary) hypertension: Secondary | ICD-10-CM | POA: Insufficient documentation

## 2018-04-10 DIAGNOSIS — Z7984 Long term (current) use of oral hypoglycemic drugs: Secondary | ICD-10-CM | POA: Insufficient documentation

## 2018-04-10 DIAGNOSIS — K264 Chronic or unspecified duodenal ulcer with hemorrhage: Secondary | ICD-10-CM

## 2018-04-10 DIAGNOSIS — D509 Iron deficiency anemia, unspecified: Secondary | ICD-10-CM

## 2018-04-10 DIAGNOSIS — M1990 Unspecified osteoarthritis, unspecified site: Secondary | ICD-10-CM | POA: Insufficient documentation

## 2018-04-10 DIAGNOSIS — I2510 Atherosclerotic heart disease of native coronary artery without angina pectoris: Secondary | ICD-10-CM | POA: Insufficient documentation

## 2018-04-10 DIAGNOSIS — R5383 Other fatigue: Secondary | ICD-10-CM | POA: Diagnosis not present

## 2018-04-10 DIAGNOSIS — D500 Iron deficiency anemia secondary to blood loss (chronic): Secondary | ICD-10-CM | POA: Insufficient documentation

## 2018-04-10 DIAGNOSIS — Z79899 Other long term (current) drug therapy: Secondary | ICD-10-CM | POA: Insufficient documentation

## 2018-04-10 DIAGNOSIS — E785 Hyperlipidemia, unspecified: Secondary | ICD-10-CM | POA: Diagnosis not present

## 2018-04-10 NOTE — Assessment & Plan Note (Signed)
1.  Normocytic anemia: - He was admitted to the hospital from 12/25/2017 through 12/28/2017 and received 1 unit of PRBC.  Ferritin was 15 and percent saturation was 23.  Creatinine was elevated at 1.7. -he was readmitted for a fall on 01/26/2018 and had to receive 1 more unit of PRBC for hemoglobin of 8.1.  MCV was 76.8. - He could not tolerate oral iron therapy secondary to severe constipation. - He has been anemic on and off since 2012.  He has microcytosis in the second part of 2019.  He also had elevated creatinine since October 2019.  -His colonoscopy on 12/28/2017 shows 5 mm polyp in the mid transverse colon, diverticulosis of the sigmoid colon, external hemorrhoids. -EGD on 12/28/2017 shows normal esophagus, 6 cm hiatal hernia, erosive gastropathy, duodenal erosion without bleeding. -He is currently on aspirin 81 mg daily as he had couple of strokes. - He presented with his caregiver today.  Denies any bleeding per rectum but has occasional black stools. - He received Feraheme infusion on 02/17/2018 and 02/24/2018.  Improvement in energy lasted until Christmas. -We reviewed his blood work.  Hemoglobin dropped to 8.4.  Ferritin was 37 on 04/03/2018.  Percent saturation was 14.  B12 was 425 and SPEP was negative. - Anemia secondary to blood loss and iron deficiency state. -I have recommended weekly Feraheme x3 weeks.  We will see him back in 6 weeks with repeat blood work.

## 2018-04-10 NOTE — Patient Instructions (Signed)
[place] at [facility] Discharge Instructions  Follow up in 6 weeks with labs prior.    Thank you for choosing [place] at [facility] to provide your oncology and hematology care.  To afford each patient quality time with our provider, please arrive at least 15 minutes before your scheduled appointment time.   If you have a lab appointment with the [facility] please come in thru the  Main Entrance and check in at the main information desk  You need to re-schedule your appointment should you arrive 10 or more minutes late.  We strive to give you quality time with our providers, and arriving late affects you and other patients whose appointments are after yours.  Also, if you no show three or more times for appointments you may be dismissed from the clinic at the providers discretion.     Again, [facility]  Our hope is that these requests will decrease the amount of time that you wait before being seen by our physicians.       _____________________________________________________________  Should you have questions after your visit to [facility], please contact our office at [phone] between the hours of 8:00 a.m. and 4:30 p.m.  Voicemails left after 4:00 p.m. will not be returned until the following business day.  For prescription refill requests, have your pharmacy contact our office and allow 72 hours.    Cancer Center Support Programs:   > Cancer Support Group  2nd Tuesday of the month 1pm-2pm, Journey Room

## 2018-04-10 NOTE — Progress Notes (Signed)
[address], [place] [address]   CLINIC:  Medical Oncology/Hematology  PCP:  Chipper Herb, [place] [address] [phone]   REASON FOR VISIT: Follow-up for microcytic anemia.  CURRENT THERAPY: intermittent iron infusions    INTERVAL HISTORY:  [name] 85 y.o. male returns for routine follow-up for microcytic anemia. He is here today with his caregiver. She reports that after the iron infusion helpped with his energy for a month and started dropping back off. He has had a few black stools. Denies any nausea, vomiting, or diarrhea. Denies any new pains. Had not noticed any recent bleeding such as epistaxis or hematuria. Denies recent chest pain on exertion, shortness of breath on minimal exertion, pre-syncopal episodes, or palpitations. Denies any numbness or tingling in hands or feet. Denies any recent fevers, infections, or recent hospitalizations. Patient reports appetite at 100% and energy level at 75%.    REVIEW OF SYSTEMS:  Review of Systems  Constitutional: Positive for fatigue.  All other systems reviewed and are negative.    PAST MEDICAL/SURGICAL HISTORY:  Past Medical History:  Diagnosis Date  . Anemia    Iron deficiency  . Aortic stenosis    s/p pericardial AVR  . Aortic valve disorder 06/23/2008   Qualifier: Diagnosis of  By: Mare Ferrari, RMA, Sherri    . Arthritis   . Benign prostatic hypertrophy   . CAD (coronary artery disease) 01/30/2011  . Carotid bruit 08/21/2016  . COLONIC POLYPS 06/23/2008   Qualifier: Diagnosis of  By: Mare Ferrari, RMA, Sherri    . Coronary artery disease    s/p CABG 08/2010    . Diabetes mellitus   . Dyslipidemia 10/15/2017  . Essential hypertension 08/21/2016  . H/O aortic valve replacement 08/21/2016  . Hyperlipidemia   . Hyperlipidemia LDL goal <70 06/23/2008   Qualifier: Diagnosis of  By: Mare Ferrari, RMA, Sherri    . Leg swelling 10/15/2017  . Orthostatic hypotension 06/22/2012  .  Palpitations 08/21/2016  . Precordial pain 09/18/2011  . Type 2 diabetes mellitus with hyperlipidemia ([place]) 06/23/2008   Qualifier: Diagnosis of  By: Mare Ferrari, RMA, Sherri    . Unstable angina ([place]) 07/24/2011   Past Surgical History:  Procedure Laterality Date  . AORTIC VALVE REPLACEMENT  08/24/10   22mm pericardial tissue valve  . COLONOSCOPY WITH PROPOFOL N/A 12/28/2017   Procedure: COLONOSCOPY WITH PROPOFOL;  Surgeon: Rogene Houston, MD;  Location: [facility] ENDO SUITE;  Service: Endoscopy;  Laterality: N/A;  . CORONARY ARTERY BYPASS GRAFT  08/24/10   LIMA to LAD, SVG to ramus intermediate, SVG to diagonal  . ESOPHAGOGASTRODUODENOSCOPY (EGD) WITH PROPOFOL N/A 12/28/2017   Procedure: ESOPHAGOGASTRODUODENOSCOPY (EGD) WITH PROPOFOL;  Surgeon: Rogene Houston, MD;  Location: [facility] ENDO SUITE;  Service: Endoscopy;  Laterality: N/A;  . HERNIA REPAIR    . knee replacements     x 2  . POLYPECTOMY  12/28/2017   Procedure: POLYPECTOMY;  Surgeon: Rogene Houston, MD;  Location: [facility] ENDO SUITE;  Service: Endoscopy;;  transverse colon      SOCIAL HISTORY:  Social History   Socioeconomic History  . Marital status: Widowed    Spouse name: Not on file  . Number of children: 4  . Years of education: 67  . Highest education level: 11th grade  Occupational History  . Occupation: Retired  Scientific laboratory technician  . Financial resource strain: Not on file  . Food insecurity:    Worry: Not on file  Inability: Not on file  . Transportation needs:    Medical: Not on file    Non-medical: Not on file  Tobacco Use  . Smoking status: Former Smoker    Types: Cigars    Last attempt to quit: 09/17/1993    Years since quitting: 24.5  . Smokeless tobacco: Never Used  Substance and Sexual Activity  . Alcohol use: Yes    Comment: occassional  . Drug use: No  . Sexual activity: Yes  Lifestyle  . Physical activity:    Days per week: Not on file    Minutes per session: Not on file  . Stress: Not on file  Relationships  .  Social connections:    Talks on phone: Not on file    Gets together: Not on file    Attends religious service: Not on file    Active member of club or organization: Not on file    Attends meetings of clubs or organizations: Not on file    Relationship status: Not on file  . Intimate partner violence:    Fear of current or ex partner: Not on file    Emotionally abused: Not on file    Physically abused: Not on file    Forced sexual activity: Not on file  Other Topics Concern  . Not on file  Social History Narrative  . Not on file    FAMILY HISTORY:  Family History  Problem Relation Age of Onset  . Cancer Mother   . Diabetes Father   . Hypertension Other   . Diabetes Other     CURRENT MEDICATIONS:  Outpatient Encounter Medications as of 04/10/2018  Medication Sig Note  . ACCU-CHEK SOFTCLIX LANCETS lancets Use as instructed   . aspirin 81 MG tablet Take 1 tablet (81 mg total) by mouth every morning.   . escitalopram (LEXAPRO) 20 MG tablet Take 20 mg by mouth daily. 04/02/2018: 10 mg daily   . furosemide (LASIX) 20 MG tablet Take 40 mg on Mon, Wed, Fri and 20 mg all other days.   Marland Kitchen glucose blood test strip CHECK BLOOD SUGAR UP TO 4 TIMES A DAY   . metFORMIN (GLUCOPHAGE) 500 MG tablet Take 1 tablet (500 mg total) by mouth 2 (two) times daily.   . metoprolol succinate (TOPROL-XL) 25 MG 24 hr tablet Take 0.5 tablets (12.5 mg total) by mouth daily before breakfast.   . pantoprazole (PROTONIX) 40 MG tablet TAKE (1) TABLET TWICE A DAY BEFOR A MEAL   . risperiDONE (RISPERDAL) 0.25 MG tablet Take 1 tablet (0.25 mg total) by mouth 3 (three) times daily.   . nitroGLYCERIN (NITROSTAT) 0.4 MG SL tablet Place 1 tablet (0.4 mg total) under the tongue every 5 (five) minutes x 3 doses as needed for chest pain. (Patient not taking: Reported on 04/02/2018)   . polyethylene glycol (MIRALAX / GLYCOLAX) packet Take 17 g by mouth daily. (Patient not taking: Reported on 04/10/2018)   . rosuvastatin (CRESTOR)  40 MG tablet Take 1 tablet (40 mg total) by mouth every evening.    No facility-administered encounter medications on file as of 04/10/2018.     ALLERGIES:  No Known Allergies   PHYSICAL EXAM:  ECOG Performance status: 2  Vitals:   04/10/18 1400  BP: (!) 110/51  Pulse: 85  Resp: 16  Temp: 97.6 F (36.4 C)  SpO2: 100%   Filed Weights   04/10/18 1400  Weight: 171 lb (77.6 kg)    Physical Exam Constitutional:  Appearance: Normal appearance. He is normal weight.  Musculoskeletal: Normal range of motion.  Skin:    General: Skin is warm and dry.  Neurological:     Mental Status: He is alert and oriented to person, place, and time. Mental status is at baseline.  Psychiatric:        Mood and Affect: Mood normal.        Behavior: Behavior normal.        Thought Content: Thought content normal.        Judgment: Judgment normal.   Chest is bilateral clear to auscultation. Extremities: 1+ edema bilaterally.   LABORATORY DATA:  I have reviewed the labs as listed.  CBC    Component Value Date/Time   WBC 6.4 04/06/2018 2211   RBC 3.00 (L) 04/06/2018 2211   HGB 8.4 (L) 04/06/2018 2211   HGB 9.1 (L) 04/02/2018 1508   HCT 27.6 (L) 04/06/2018 2211   HCT 28.6 (L) 04/02/2018 1508   PLT 194 04/06/2018 2211   PLT 222 04/02/2018 1508   MCV 92.0 04/06/2018 2211   MCV 89 04/02/2018 1508   MCH 28.0 04/06/2018 2211   MCHC 30.4 04/06/2018 2211   RDW 16.2 (H) 04/06/2018 2211   RDW 16.5 (H) 04/02/2018 1508   LYMPHSABS 1.3 04/06/2018 2211   LYMPHSABS 1.6 04/02/2018 1508   MONOABS 0.7 04/06/2018 2211   EOSABS 0.3 04/06/2018 2211   EOSABS 0.3 04/02/2018 1508   BASOSABS 0.0 04/06/2018 2211   BASOSABS 0.1 04/02/2018 1508   CMP Latest Ref Rng & Units 04/06/2018 04/03/2018 04/02/2018  Glucose 70 - 99 mg/dL 122(H) 134(H) 104(H)  BUN 8 - 23 mg/dL 22 21 20   Creatinine 0.61 - 1.24 mg/dL 1.13 1.09 1.06  Sodium 135 - 145 mmol/L 136 139 141  Potassium 3.5 - 5.1 mmol/L 3.9 4.4 4.9    Chloride 98 - 111 mmol/L 103 104 103  CO2 22 - 32 mmol/L 26 25 25   Calcium 8.9 - 10.3 mg/dL 8.5(L) 9.1 9.4  Total Protein 6.5 - 8.1 g/dL 6.4(L) 6.8 -  Total Bilirubin 0.3 - 1.2 mg/dL 0.6 0.6 -  Alkaline Phos 38 - 126 U/L 76 84 -  AST 15 - 41 U/L 26 30 -  ALT 0 - 44 U/L 22 23 -       DIAGNOSTIC IMAGING:  I have independently reviewed the scans and discussed with the patient.   I have reviewed Francene Finders, NP's note and agree with the documentation.  I personally performed a face-to-face visit, made revisions and my assessment and plan is as follows.    ASSESSMENT & PLAN:   Microcytic anemia 1.  Normocytic anemia: - He was admitted to the hospital from 12/25/2017 through 12/28/2017 and received 1 unit of PRBC.  Ferritin was 15 and percent saturation was 23.  Creatinine was elevated at 1.7. -he was readmitted for a fall on 01/26/2018 and had to receive 1 more unit of PRBC for hemoglobin of 8.1.  MCV was 76.8. - He could not tolerate oral iron therapy secondary to severe constipation. - He has been anemic on and off since 2012.  He has microcytosis in the second part of 2019.  He also had elevated creatinine since October 2019.  -His colonoscopy on 12/28/2017 shows 5 mm polyp in the mid transverse colon, diverticulosis of the sigmoid colon, external hemorrhoids. -EGD on 12/28/2017 shows normal esophagus, 6 cm hiatal hernia, erosive gastropathy, duodenal erosion without bleeding. -He is currently on aspirin 81 mg daily as  he had couple of strokes. - He presented with his caregiver today.  Denies any bleeding per rectum but has occasional black stools. - He received Feraheme infusion on 02/17/2018 and 02/24/2018.  Improvement in energy lasted until Christmas. -We reviewed his blood work.  Hemoglobin dropped to 8.4.  Ferritin was 37 on 04/03/2018.  Percent saturation was 14.  B12 was 425 and SPEP was negative. - Anemia secondary to blood loss and iron deficiency state. -I have recommended  weekly Feraheme x3 weeks.  We will see him back in 6 weeks with repeat blood work.      Orders placed this encounter:  Orders Placed This Encounter  Procedures  . CBC with Differential/Platelet  . Comprehensive metabolic panel  . Ferritin  . Iron and TIBC  . Lactate dehydrogenase  . Vitamin B12  . Folate      Derek Jack, MD [place] [phone]

## 2018-04-13 ENCOUNTER — Ambulatory Visit: Payer: Medicare HMO | Admitting: Physician Assistant

## 2018-04-15 ENCOUNTER — Inpatient Hospital Stay (HOSPITAL_COMMUNITY): Payer: Medicare HMO

## 2018-04-15 VITALS — BP 109/56 | HR 87 | Temp 97.7°F | Resp 18

## 2018-04-15 DIAGNOSIS — Z66 Do not resuscitate: Secondary | ICD-10-CM | POA: Diagnosis present

## 2018-04-15 DIAGNOSIS — I25810 Atherosclerosis of coronary artery bypass graft(s) without angina pectoris: Secondary | ICD-10-CM | POA: Diagnosis not present

## 2018-04-15 DIAGNOSIS — F0390 Unspecified dementia without behavioral disturbance: Secondary | ICD-10-CM | POA: Diagnosis present

## 2018-04-15 DIAGNOSIS — R4182 Altered mental status, unspecified: Secondary | ICD-10-CM | POA: Diagnosis not present

## 2018-04-15 DIAGNOSIS — R509 Fever, unspecified: Secondary | ICD-10-CM | POA: Diagnosis not present

## 2018-04-15 DIAGNOSIS — E876 Hypokalemia: Secondary | ICD-10-CM | POA: Diagnosis present

## 2018-04-15 DIAGNOSIS — D509 Iron deficiency anemia, unspecified: Secondary | ICD-10-CM

## 2018-04-15 DIAGNOSIS — Z951 Presence of aortocoronary bypass graft: Secondary | ICD-10-CM | POA: Diagnosis not present

## 2018-04-15 DIAGNOSIS — R0902 Hypoxemia: Secondary | ICD-10-CM | POA: Diagnosis not present

## 2018-04-15 DIAGNOSIS — Z8673 Personal history of transient ischemic attack (TIA), and cerebral infarction without residual deficits: Secondary | ICD-10-CM | POA: Diagnosis not present

## 2018-04-15 DIAGNOSIS — I350 Nonrheumatic aortic (valve) stenosis: Secondary | ICD-10-CM | POA: Diagnosis present

## 2018-04-15 DIAGNOSIS — I248 Other forms of acute ischemic heart disease: Secondary | ICD-10-CM | POA: Diagnosis not present

## 2018-04-15 DIAGNOSIS — I25709 Atherosclerosis of coronary artery bypass graft(s), unspecified, with unspecified angina pectoris: Secondary | ICD-10-CM | POA: Diagnosis not present

## 2018-04-15 DIAGNOSIS — Z86010 Personal history of colonic polyps: Secondary | ICD-10-CM

## 2018-04-15 DIAGNOSIS — B9561 Methicillin susceptible Staphylococcus aureus infection as the cause of diseases classified elsewhere: Secondary | ICD-10-CM | POA: Diagnosis not present

## 2018-04-15 DIAGNOSIS — E119 Type 2 diabetes mellitus without complications: Secondary | ICD-10-CM | POA: Diagnosis not present

## 2018-04-15 DIAGNOSIS — Z87891 Personal history of nicotine dependence: Secondary | ICD-10-CM

## 2018-04-15 DIAGNOSIS — Z79899 Other long term (current) drug therapy: Secondary | ICD-10-CM | POA: Diagnosis not present

## 2018-04-15 DIAGNOSIS — Z7982 Long term (current) use of aspirin: Secondary | ICD-10-CM | POA: Diagnosis not present

## 2018-04-15 DIAGNOSIS — I10 Essential (primary) hypertension: Secondary | ICD-10-CM | POA: Diagnosis present

## 2018-04-15 DIAGNOSIS — E1169 Type 2 diabetes mellitus with other specified complication: Secondary | ICD-10-CM | POA: Diagnosis present

## 2018-04-15 DIAGNOSIS — I249 Acute ischemic heart disease, unspecified: Secondary | ICD-10-CM | POA: Diagnosis not present

## 2018-04-15 DIAGNOSIS — R0689 Other abnormalities of breathing: Secondary | ICD-10-CM | POA: Diagnosis not present

## 2018-04-15 DIAGNOSIS — E782 Mixed hyperlipidemia: Secondary | ICD-10-CM | POA: Diagnosis not present

## 2018-04-15 DIAGNOSIS — R918 Other nonspecific abnormal finding of lung field: Secondary | ICD-10-CM | POA: Diagnosis not present

## 2018-04-15 DIAGNOSIS — I214 Non-ST elevation (NSTEMI) myocardial infarction: Secondary | ICD-10-CM | POA: Diagnosis present

## 2018-04-15 DIAGNOSIS — Z8249 Family history of ischemic heart disease and other diseases of the circulatory system: Secondary | ICD-10-CM

## 2018-04-15 DIAGNOSIS — E1165 Type 2 diabetes mellitus with hyperglycemia: Secondary | ICD-10-CM | POA: Diagnosis not present

## 2018-04-15 DIAGNOSIS — A411 Sepsis due to other specified staphylococcus: Secondary | ICD-10-CM | POA: Diagnosis present

## 2018-04-15 DIAGNOSIS — N400 Enlarged prostate without lower urinary tract symptoms: Secondary | ICD-10-CM | POA: Diagnosis present

## 2018-04-15 DIAGNOSIS — F329 Major depressive disorder, single episode, unspecified: Secondary | ICD-10-CM | POA: Diagnosis present

## 2018-04-15 DIAGNOSIS — Z952 Presence of prosthetic heart valve: Secondary | ICD-10-CM | POA: Diagnosis not present

## 2018-04-15 DIAGNOSIS — R404 Transient alteration of awareness: Secondary | ICD-10-CM | POA: Diagnosis not present

## 2018-04-15 DIAGNOSIS — R0989 Other specified symptoms and signs involving the circulatory and respiratory systems: Secondary | ICD-10-CM | POA: Diagnosis not present

## 2018-04-15 DIAGNOSIS — I359 Nonrheumatic aortic valve disorder, unspecified: Secondary | ICD-10-CM | POA: Diagnosis not present

## 2018-04-15 DIAGNOSIS — I2510 Atherosclerotic heart disease of native coronary artery without angina pectoris: Secondary | ICD-10-CM | POA: Diagnosis present

## 2018-04-15 DIAGNOSIS — F19939 Other psychoactive substance use, unspecified with withdrawal, unspecified: Secondary | ICD-10-CM | POA: Diagnosis not present

## 2018-04-15 DIAGNOSIS — G92 Toxic encephalopathy: Secondary | ICD-10-CM | POA: Diagnosis present

## 2018-04-15 DIAGNOSIS — D649 Anemia, unspecified: Secondary | ICD-10-CM | POA: Diagnosis not present

## 2018-04-15 DIAGNOSIS — R7881 Bacteremia: Secondary | ICD-10-CM | POA: Diagnosis not present

## 2018-04-15 DIAGNOSIS — Z833 Family history of diabetes mellitus: Secondary | ICD-10-CM

## 2018-04-15 DIAGNOSIS — E785 Hyperlipidemia, unspecified: Secondary | ICD-10-CM | POA: Diagnosis present

## 2018-04-15 DIAGNOSIS — N289 Disorder of kidney and ureter, unspecified: Secondary | ICD-10-CM | POA: Diagnosis not present

## 2018-04-15 DIAGNOSIS — I342 Nonrheumatic mitral (valve) stenosis: Secondary | ICD-10-CM | POA: Diagnosis not present

## 2018-04-15 DIAGNOSIS — I959 Hypotension, unspecified: Secondary | ICD-10-CM | POA: Diagnosis present

## 2018-04-15 DIAGNOSIS — R000 Tachycardia, unspecified: Secondary | ICD-10-CM | POA: Diagnosis not present

## 2018-04-15 DIAGNOSIS — Z8719 Personal history of other diseases of the digestive system: Secondary | ICD-10-CM

## 2018-04-15 DIAGNOSIS — A4101 Sepsis due to Methicillin susceptible Staphylococcus aureus: Secondary | ICD-10-CM | POA: Diagnosis not present

## 2018-04-15 DIAGNOSIS — R6510 Systemic inflammatory response syndrome (SIRS) of non-infectious origin without acute organ dysfunction: Secondary | ICD-10-CM | POA: Diagnosis not present

## 2018-04-15 DIAGNOSIS — I361 Nonrheumatic tricuspid (valve) insufficiency: Secondary | ICD-10-CM | POA: Diagnosis not present

## 2018-04-15 DIAGNOSIS — R58 Hemorrhage, not elsewhere classified: Secondary | ICD-10-CM | POA: Diagnosis not present

## 2018-04-15 DIAGNOSIS — B349 Viral infection, unspecified: Secondary | ICD-10-CM | POA: Diagnosis not present

## 2018-04-15 DIAGNOSIS — A419 Sepsis, unspecified organism: Secondary | ICD-10-CM | POA: Diagnosis present

## 2018-04-15 DIAGNOSIS — R52 Pain, unspecified: Secondary | ICD-10-CM | POA: Diagnosis not present

## 2018-04-15 DIAGNOSIS — R410 Disorientation, unspecified: Secondary | ICD-10-CM | POA: Diagnosis not present

## 2018-04-15 MED ORDER — SODIUM CHLORIDE 0.9 % IV SOLN
INTRAVENOUS | Status: DC
  Administered 2018-04-15: 14:00:00 via INTRAVENOUS

## 2018-04-15 MED ORDER — SODIUM CHLORIDE 0.9 % IV SOLN
510.0000 mg | Freq: Once | INTRAVENOUS | Status: AC
  Administered 2018-04-15: 510 mg via INTRAVENOUS
  Filled 2018-04-15: qty 510

## 2018-04-15 NOTE — Patient Instructions (Signed)
[facility] at [facility]  Discharge Instructions:   _______________________________________________________________  Thank you for choosing [facility] at [facility] to provide your oncology and hematology care.  To afford each patient quality time with our providers, please arrive at least 15 minutes before your scheduled appointment.  You need to re-schedule your appointment if you arrive 10 or more minutes late.  We strive to give you quality time with our providers, and arriving late affects you and other patients whose appointments are after yours.  Also, if you no show three or more times for appointments you may be dismissed from the clinic.  Again, thank you for choosing [facility] at [facility]. Our hope is that these requests will allow you access to exceptional care and in a timely manner. _______________________________________________________________  If you have questions after your visit, please contact our office at [phone] between the hours of 8:30 a.m. and 5:00 p.m. Voicemails left after 4:30 p.m. will not be returned until the following business day. _______________________________________________________________  For prescription refill requests, have your pharmacy contact our office. _______________________________________________________________  Recommendations made by the consultant and any test results will be sent to your referring physician. _______________________________________________________________ 

## 2018-04-15 NOTE — Progress Notes (Signed)
Iron given per orders. Patient tolerated it well without problems. Vitals stable and discharged home from clinic ambulatory. Follow up as scheduled.  

## 2018-04-16 ENCOUNTER — Other Ambulatory Visit: Payer: Self-pay

## 2018-04-16 ENCOUNTER — Encounter (HOSPITAL_COMMUNITY): Payer: Self-pay | Admitting: *Deleted

## 2018-04-16 ENCOUNTER — Observation Stay (HOSPITAL_BASED_OUTPATIENT_CLINIC_OR_DEPARTMENT_OTHER)
Admission: EM | Admit: 2018-04-16 | Discharge: 2018-04-17 | Disposition: A | Discharge status: Home / Self Care | Payer: Medicare HMO | Source: Home / Self Care | Attending: Internal Medicine | Admitting: Internal Medicine

## 2018-04-16 ENCOUNTER — Emergency Department (HOSPITAL_COMMUNITY): Payer: Medicare HMO

## 2018-04-16 DIAGNOSIS — Z66 Do not resuscitate: Secondary | ICD-10-CM

## 2018-04-16 DIAGNOSIS — I10 Essential (primary) hypertension: Secondary | ICD-10-CM

## 2018-04-16 DIAGNOSIS — R509 Fever, unspecified: Secondary | ICD-10-CM

## 2018-04-16 DIAGNOSIS — I2510 Atherosclerotic heart disease of native coronary artery without angina pectoris: Secondary | ICD-10-CM

## 2018-04-16 DIAGNOSIS — Z952 Presence of prosthetic heart valve: Secondary | ICD-10-CM

## 2018-04-16 DIAGNOSIS — I350 Nonrheumatic aortic (valve) stenosis: Secondary | ICD-10-CM | POA: Insufficient documentation

## 2018-04-16 DIAGNOSIS — R4182 Altered mental status, unspecified: Secondary | ICD-10-CM

## 2018-04-16 DIAGNOSIS — D509 Iron deficiency anemia, unspecified: Secondary | ICD-10-CM | POA: Insufficient documentation

## 2018-04-16 DIAGNOSIS — F329 Major depressive disorder, single episode, unspecified: Secondary | ICD-10-CM | POA: Insufficient documentation

## 2018-04-16 DIAGNOSIS — K219 Gastro-esophageal reflux disease without esophagitis: Secondary | ICD-10-CM

## 2018-04-16 DIAGNOSIS — Z79899 Other long term (current) drug therapy: Secondary | ICD-10-CM

## 2018-04-16 DIAGNOSIS — N400 Enlarged prostate without lower urinary tract symptoms: Secondary | ICD-10-CM | POA: Insufficient documentation

## 2018-04-16 DIAGNOSIS — E1143 Type 2 diabetes mellitus with diabetic autonomic (poly)neuropathy: Secondary | ICD-10-CM

## 2018-04-16 DIAGNOSIS — R1110 Vomiting, unspecified: Secondary | ICD-10-CM

## 2018-04-16 DIAGNOSIS — Z951 Presence of aortocoronary bypass graft: Secondary | ICD-10-CM | POA: Insufficient documentation

## 2018-04-16 DIAGNOSIS — R6510 Systemic inflammatory response syndrome (SIRS) of non-infectious origin without acute organ dysfunction: Secondary | ICD-10-CM

## 2018-04-16 DIAGNOSIS — A419 Sepsis, unspecified organism: Secondary | ICD-10-CM

## 2018-04-16 DIAGNOSIS — Z7982 Long term (current) use of aspirin: Secondary | ICD-10-CM

## 2018-04-16 DIAGNOSIS — E119 Type 2 diabetes mellitus without complications: Secondary | ICD-10-CM

## 2018-04-16 DIAGNOSIS — E785 Hyperlipidemia, unspecified: Secondary | ICD-10-CM | POA: Insufficient documentation

## 2018-04-16 LAB — CBC WITH DIFFERENTIAL/PLATELET
ABS IMMATURE GRANULOCYTES: 0.04 10*3/uL (ref 0.00–0.07)
Basophils Absolute: 0 10*3/uL (ref 0.0–0.1)
Basophils Relative: 0 %
Eosinophils Absolute: 0 10*3/uL (ref 0.0–0.5)
Eosinophils Relative: 0 %
HCT: 28.9 % — ABNORMAL LOW (ref 39.0–52.0)
Hemoglobin: 9.3 g/dL — ABNORMAL LOW (ref 13.0–17.0)
Immature Granulocytes: 0 %
Lymphocytes Relative: 7 %
Lymphs Abs: 0.8 10*3/uL (ref 0.7–4.0)
MCH: 29.1 pg (ref 26.0–34.0)
MCHC: 32.2 g/dL (ref 30.0–36.0)
MCV: 90.3 fL (ref 80.0–100.0)
Monocytes Absolute: 1 10*3/uL (ref 0.1–1.0)
Monocytes Relative: 8 %
Neutro Abs: 9.6 10*3/uL — ABNORMAL HIGH (ref 1.7–7.7)
Neutrophils Relative %: 85 %
PLATELETS: 190 10*3/uL (ref 150–400)
RBC: 3.2 MIL/uL — ABNORMAL LOW (ref 4.22–5.81)
RDW: 14.5 % (ref 11.5–15.5)
WBC: 11.5 10*3/uL — ABNORMAL HIGH (ref 4.0–10.5)
nRBC: 0 % (ref 0.0–0.2)

## 2018-04-16 LAB — COMPREHENSIVE METABOLIC PANEL
ALT: 20 U/L (ref 0–44)
AST: 26 U/L (ref 15–41)
Albumin: 3.3 g/dL — ABNORMAL LOW (ref 3.5–5.0)
Alkaline Phosphatase: 80 U/L (ref 38–126)
Anion gap: 9 (ref 5–15)
BUN: 21 mg/dL (ref 8–23)
CHLORIDE: 100 mmol/L (ref 98–111)
CO2: 24 mmol/L (ref 22–32)
Calcium: 8.9 mg/dL (ref 8.9–10.3)
Creatinine, Ser: 1.14 mg/dL (ref 0.61–1.24)
GFR calc Af Amer: >60 mL/min (ref >60)
GFR calc non Af Amer: 59 mL/min — ABNORMAL LOW (ref >60)
Glucose, Bld: 200 mg/dL — ABNORMAL HIGH (ref 70–99)
Potassium: 3.7 mmol/L (ref 3.5–5.1)
Sodium: 133 mmol/L — ABNORMAL LOW (ref 135–145)
Total Bilirubin: 0.9 mg/dL (ref 0.3–1.2)
Total Protein: 6.5 g/dL (ref 6.5–8.1)

## 2018-04-16 LAB — URINALYSIS, ROUTINE W REFLEX MICROSCOPIC
Bilirubin Urine: NEGATIVE
Glucose, UA: NEGATIVE mg/dL
Hgb urine dipstick: NEGATIVE
Ketones, ur: NEGATIVE mg/dL
LEUKOCYTES UA: NEGATIVE
Nitrite: NEGATIVE
Protein, ur: NEGATIVE mg/dL
Specific Gravity, Urine: 1.015 (ref 1.005–1.030)
pH: 5 (ref 5.0–8.0)

## 2018-04-16 LAB — LACTIC ACID, PLASMA
LACTIC ACID, VENOUS: 0.9 mmol/L (ref 0.5–1.9)
Lactic Acid, Venous: 1.3 mmol/L (ref 0.5–1.9)

## 2018-04-16 LAB — INFLUENZA PANEL BY PCR (TYPE A & B)
Influenza A By PCR: NEGATIVE
Influenza B By PCR: NEGATIVE

## 2018-04-16 MED ORDER — SODIUM CHLORIDE 0.9 % IV BOLUS
1000.0000 mL | Freq: Once | INTRAVENOUS | Status: AC
  Administered 2018-04-16: 1000 mL via INTRAVENOUS

## 2018-04-16 MED ORDER — SODIUM CHLORIDE 0.9 % IV SOLN: INTRAVENOUS | Status: DC

## 2018-04-16 MED ORDER — SODIUM CHLORIDE 0.9 % IV SOLN
2.0000 g | Freq: Two times a day (BID) | INTRAVENOUS | Status: DC
  Administered 2018-04-17: 2 g via INTRAVENOUS
  Filled 2018-04-16: qty 2

## 2018-04-16 MED ORDER — ASPIRIN EC 81 MG PO TBEC
81.0000 mg | DELAYED_RELEASE_TABLET | ORAL | Status: DC
  Administered 2018-04-17: 81 mg via ORAL
  Filled 2018-04-16: qty 1

## 2018-04-16 MED ORDER — INSULIN ASPART 100 UNIT/ML ~~LOC~~ SOLN: 0.0000 [IU] | Freq: Three times a day (TID) | SUBCUTANEOUS | Status: DC

## 2018-04-16 MED ORDER — VANCOMYCIN HCL IN DEXTROSE 1-5 GM/200ML-% IV SOLN: 1000.0000 mg | Freq: Once | INTRAVENOUS | Status: DC

## 2018-04-16 MED ORDER — ESCITALOPRAM OXALATE 10 MG PO TABS
10.0000 mg | ORAL_TABLET | Freq: Every day | ORAL | Status: DC
  Administered 2018-04-17: 10 mg via ORAL
  Filled 2018-04-16: qty 1

## 2018-04-16 MED ORDER — SODIUM CHLORIDE 0.9 % IV SOLN
2.0000 g | Freq: Once | INTRAVENOUS | Status: AC
  Administered 2018-04-16: 2 g via INTRAVENOUS
  Filled 2018-04-16: qty 2

## 2018-04-16 MED ORDER — ACETAMINOPHEN 500 MG PO TABS
1000.0000 mg | ORAL_TABLET | Freq: Once | ORAL | Status: AC
  Administered 2018-04-16: 1000 mg via ORAL
  Filled 2018-04-16: qty 2

## 2018-04-16 MED ORDER — ACETAMINOPHEN 650 MG RE SUPP: 650.0000 mg | Freq: Four times a day (QID) | RECTAL | Status: DC | PRN

## 2018-04-16 MED ORDER — VANCOMYCIN HCL 10 G IV SOLR
2000.0000 mg | Freq: Once | INTRAVENOUS | Status: AC
  Administered 2018-04-16: 2000 mg via INTRAVENOUS
  Filled 2018-04-16 (×2): qty 2000

## 2018-04-16 MED ORDER — ROSUVASTATIN CALCIUM 20 MG PO TABS
40.0000 mg | ORAL_TABLET | Freq: Every evening | ORAL | Status: DC
  Administered 2018-04-17: 40 mg via ORAL
  Filled 2018-04-16: qty 1
  Filled 2018-04-16 (×3): qty 2

## 2018-04-16 MED ORDER — PANTOPRAZOLE SODIUM 40 MG PO TBEC
40.0000 mg | DELAYED_RELEASE_TABLET | Freq: Two times a day (BID) | ORAL | Status: DC
  Administered 2018-04-17 (×2): 40 mg via ORAL
  Filled 2018-04-16 (×2): qty 1

## 2018-04-16 MED ORDER — INSULIN ASPART 100 UNIT/ML ~~LOC~~ SOLN: 0.0000 [IU] | Freq: Every day | SUBCUTANEOUS | Status: DC

## 2018-04-16 MED ORDER — SODIUM CHLORIDE 0.9 % IV BOLUS
500.0000 mL | Freq: Once | INTRAVENOUS | Status: AC
  Administered 2018-04-16: 500 mL via INTRAVENOUS

## 2018-04-16 MED ORDER — SODIUM CHLORIDE 0.9 % IV SOLN
1000.0000 mL | INTRAVENOUS | Status: DC
  Administered 2018-04-16: 1000 mL via INTRAVENOUS

## 2018-04-16 MED ORDER — ACETAMINOPHEN 325 MG PO TABS: 650.0000 mg | ORAL_TABLET | Freq: Four times a day (QID) | ORAL | Status: DC | PRN

## 2018-04-16 MED ORDER — ENOXAPARIN SODIUM 40 MG/0.4ML ~~LOC~~ SOLN
40.0000 mg | SUBCUTANEOUS | Status: DC
  Administered 2018-04-17: 40 mg via SUBCUTANEOUS
  Filled 2018-04-16: qty 0.4

## 2018-04-16 MED ORDER — VANCOMYCIN HCL IN DEXTROSE 1-5 GM/200ML-% IV SOLN: 1000.0000 mg | INTRAVENOUS | Status: DC

## 2018-04-16 MED ORDER — RISPERIDONE 0.25 MG PO TABS
0.2500 mg | ORAL_TABLET | Freq: Three times a day (TID) | ORAL | Status: DC
  Administered 2018-04-17 (×2): 0.25 mg via ORAL
  Filled 2018-04-16 (×11): qty 1

## 2018-04-16 NOTE — Progress Notes (Signed)
Pharmacy Antibiotic Note  [name] is a 85 y.o. male admitted on 04/16/2018 with infection of unknown source.  Pharmacy has been consulted for vancomycin and cefepime  dosing.  Plan: Start cefepime 2g IV q12h  Loading dose:  vancomycin 2g IV x1 dose tonight Maintenance dose:  Vancomycin 1g Iv q24h starting tomorrow at 1500 Goal vancomyin trough range:  15-20  mcg/mL Pharmacy will continue to monitor renal function, vancomycin troughs as clinically appropriate, cultures and patient progress.    Height: 5\' 7"  (170.2 cm) Weight: 170 lb 13.7 oz (77.5 kg) IBW/kg (Calculated) : 66.1  Temp (24hrs), Avg:102.1 F (38.9 C), Min:102.1 F (38.9 C), Max:102.1 F (38.9 C)  Recent Labs  Lab 04/16/18 1837  WBC 11.5*    Estimated Creatinine Clearance: 46.3 mL/min (by C-G formula based on SCr of 1.13 mg/dL).    No Known Allergies  Antimicrobials this admission: 1/23 cefepime >>   1/23 vancomycin >>      Microbiology results: 1/23 [facility] x2:  In progress   UCx:     Sputum:      MRSA PCR:    Thank you for allowing pharmacy to be a part of this patient's care.  [name] 04/16/2018 6:58 PM

## 2018-04-16 NOTE — ED Triage Notes (Signed)
Pt was here at [facility] yesterday and got his normal iron infusion. Pt woke up this morning and reports feeling generally weak, abdominal pain, nausea, decreased appetite all day.  Pt vomited one time with EMS. EMS reports family told them that pt has dementia and tends to swing at people when they come near him.

## 2018-04-16 NOTE — ED Notes (Signed)
Both daughters updated and informed of need to do a phone tree to share information

## 2018-04-16 NOTE — H&P (Signed)
History and Physical    [name] PYK:[id number] DOB: [birth year] DOA: 04/16/2018  PCP: Chipper Herb, MD Patient coming from: Home  Chief Complaint: Fever, generalized weakness  HPI: [name] is a 85 y.o. male with medical history significant of dementia, iron deficiency anemia, aortic stenosis status post valve replacement, BPH, CAD status post CABG, type 2 diabetes, hypertension, hyperlipidemia presenting to the hospital for evaluation of fever and generalized weakness.  History per EMS: Patient had an iron transfusion yesterday without any complication.  He was reportedly well until today when he was found to have a fever and altered mental status. Also having nausea and vomiting.   Patient has baseline dementia and it was difficult to obtain a history from him.  He was not sure why he is at the hospital.  Denied having any fevers, chills, chest pain, shortness of breath, abdominal pain, or fatigue.  States he vomited once yesterday but is currently not nauseous anymore and wants to eat.  No additional history could be obtained from him.  I spoke to the patient's daughter [name] over the phone.  She informed me that the patient lives on his own and a caregiver checks on him.  She was not able to provide any additional history as she has not been at the patient's home in these past few days.  Stated patient has baseline dementia and she is not sure if he has been more confused recently.  Review of Systems: As per HPI otherwise 10 point review of systems negative.  Past Medical History:  Diagnosis Date  . Anemia    Iron deficiency  . Aortic stenosis    s/p pericardial AVR  . Aortic valve disorder 06/23/2008   Qualifier: Diagnosis of  By: Mare Ferrari, RMA, Sherri    . Arthritis   . Benign prostatic hypertrophy   . CAD (coronary artery disease) 01/30/2011  . Carotid bruit 08/21/2016  . COLONIC POLYPS 06/23/2008   Qualifier: Diagnosis of  By: Mare Ferrari, RMA, Sherri    . Coronary artery  disease    s/p CABG 08/2010    . Diabetes mellitus   . Dyslipidemia 10/15/2017  . Essential hypertension 08/21/2016  . H/O aortic valve replacement 08/21/2016  . Hyperlipidemia   . Hyperlipidemia LDL goal <70 06/23/2008   Qualifier: Diagnosis of  By: Mare Ferrari, RMA, Sherri    . Leg swelling 10/15/2017  . Orthostatic hypotension 06/22/2012  . Palpitations 08/21/2016  . Precordial pain 09/18/2011  . Type 2 diabetes mellitus with hyperlipidemia ([place]) 06/23/2008   Qualifier: Diagnosis of  By: Mare Ferrari, RMA, Sherri    . Unstable angina ([place]) 07/24/2011    Past Surgical History:  Procedure Laterality Date  . AORTIC VALVE REPLACEMENT  08/24/10   19mm pericardial tissue valve  . COLONOSCOPY WITH PROPOFOL N/A 12/28/2017   Procedure: COLONOSCOPY WITH PROPOFOL;  Surgeon: Rogene Houston, MD;  [facility];  Service: Endoscopy;  Laterality: N/A;  . CORONARY ARTERY BYPASS GRAFT  08/24/10   LIMA to LAD, SVG to ramus intermediate, SVG to diagonal  . ESOPHAGOGASTRODUODENOSCOPY (EGD) WITH PROPOFOL N/A 12/28/2017   Procedure: ESOPHAGOGASTRODUODENOSCOPY (EGD) WITH PROPOFOL;  Surgeon: Rogene Houston, MD;  [facility];  Service: Endoscopy;  Laterality: N/A;  . HERNIA REPAIR    . knee replacements     x 2  . POLYPECTOMY  12/28/2017   Procedure: POLYPECTOMY;  Surgeon: Rogene Houston, MD;  [facility];  Service: Endoscopy;;  transverse colon  reports that he quit smoking about 24 years ago. His smoking use included cigars. He has never used smokeless tobacco. He reports current alcohol use. He reports that he does not use drugs.  No Known Allergies  Family History  Problem Relation Age of Onset  . Cancer Mother   . Diabetes Father   . Hypertension Other   . Diabetes Other     Prior to Admission medications   Medication Sig Start Date End Date Taking? Authorizing Provider  ACCU-CHEK SOFTCLIX LANCETS lancets Use as instructed 09/15/15   Chipper Herb, MD  aspirin 81 MG  tablet Take 1 tablet (81 mg total) by mouth every morning. 12/25/17   Chipper Herb, MD  escitalopram (LEXAPRO) 20 MG tablet Take 20 mg by mouth daily.    [provider]  escitalopram (LEXAPRO) 5 MG tablet  03/30/18   [provider]  furosemide (LASIX) 20 MG tablet Take 40 mg on Mon, Wed, Fri and 20 mg all other days. 04/02/18   Chipper Herb, MD  glucose blood test strip CHECK BLOOD SUGAR UP TO 4 TIMES A DAY 03/31/18   Chipper Herb, MD  metFORMIN (GLUCOPHAGE) 500 MG tablet Take 1 tablet (500 mg total) by mouth 2 (two) times daily. 12/25/17   Chipper Herb, MD  metoprolol succinate (TOPROL-XL) 25 MG 24 hr tablet Take 0.5 tablets (12.5 mg total) by mouth daily before breakfast. 01/21/18   Chipper Herb, MD  nitroGLYCERIN (NITROSTAT) 0.4 MG SL tablet Place 1 tablet (0.4 mg total) under the tongue every 5 (five) minutes x 3 doses as needed for chest pain. Patient not taking: Reported on 04/02/2018 08/21/16   Minus Breeding, MD  pantoprazole (PROTONIX) 40 MG tablet TAKE (1) TABLET TWICE A DAY BEFOR A MEAL 02/24/18   Chipper Herb, MD  polyethylene glycol [facility] / Floria Raveling) packet Take 17 g by mouth daily. Patient not taking: Reported on 04/10/2018 02/06/18   Chipper Herb, MD  risperiDONE (RISPERDAL) 0.25 MG tablet Take 1 tablet (0.25 mg total) by mouth 3 (three) times daily. 04/02/18   Chipper Herb, MD  rosuvastatin (CRESTOR) 40 MG tablet Take 1 tablet (40 mg total) by mouth every evening. 12/25/17 04/02/18  Chipper Herb, MD    Physical Exam: Vitals:   04/16/18 2105 04/16/18 2130 04/16/18 2200 04/16/18 2316  BP:  (!) 82/53 (!) 94/59 (!) 92/59  Pulse:    94  Resp:  20 19 16   Temp: 98.4 F (36.9 C)     TempSrc: Oral     SpO2:    96%  Weight:      Height:        Physical Exam  Constitutional: He appears well-developed and well-nourished. No distress.  HENT:  Head: Normocephalic.  Dry mucous membranes  Eyes: Pupils are equal, round, and reactive to light.  Neck:  Neck supple.  Cardiovascular: Normal rate, regular rhythm and intact distal pulses.  Pulmonary/Chest: Effort normal and breath sounds normal. No respiratory distress. He has no wheezes. He has no rales.  Abdominal: Soft. Bowel sounds are normal. He exhibits no distension. There is no abdominal tenderness. There is no guarding.  Musculoskeletal:        General: No edema.  Neurological:  Awake and alert Oriented to person and place.  He knows it is January 2020.  Does not know the exact date. Speech fluent, tongue midline, no facial droop. Moving all extremities spontaneously.  Skin: Skin is warm and dry.  He is not diaphoretic.     Labs on Admission: I have personally reviewed following labs and imaging studies  CBC: Recent Labs  Lab 04/16/18 1837  WBC 11.5*  NEUTROABS 9.6*  HGB 9.3*  HCT 28.9*  MCV 90.3  PLT 671   Basic Metabolic Panel: Recent Labs  Lab 04/16/18 1837  NA 133*  K 3.7  CL 100  CO2 24  GLUCOSE 200*  BUN 21  CREATININE 1.14  CALCIUM 8.9   GFR: Estimated Creatinine Clearance: 45.9 mL/min (by C-G formula based on SCr of 1.14 mg/dL). Liver Function Tests: Recent Labs  Lab 04/16/18 1837  AST 26  ALT 20  ALKPHOS 80  BILITOT 0.9  PROT 6.5  ALBUMIN 3.3*   No results for input(s): LIPASE, AMYLASE in the last 168 hours. No results for input(s): AMMONIA in the last 168 hours. Coagulation Profile: No results for input(s): INR, PROTIME in the last 168 hours. Cardiac Enzymes: No results for input(s): CKTOTAL, CKMB, CKMBINDEX, TROPONINI in the last 168 hours. BNP (last 3 results) No results for input(s): PROBNP in the last 8760 hours. HbA1C: No results for input(s): HGBA1C in the last 72 hours. CBG: No results for input(s): GLUCAP in the last 168 hours. Lipid Profile: No results for input(s): CHOL, HDL, LDLCALC, TRIG, CHOLHDL, LDLDIRECT in the last 72 hours. Thyroid Function Tests: No results for input(s): TSH, T4TOTAL, FREET4, T3FREE, THYROIDAB in  the last 72 hours. Anemia Panel: No results for input(s): VITAMINB12, FOLATE, FERRITIN, TIBC, IRON, RETICCTPCT in the last 72 hours. Urine analysis:    Component Value Date/Time   COLORURINE YELLOW 04/16/2018 1845   APPEARANCEUR CLEAR 04/16/2018 1845   APPEARANCEUR Clear 07/24/2016 1110   LABSPEC 1.015 04/16/2018 1845   PHURINE 5.0 04/16/2018 1845   GLUCOSEU NEGATIVE 04/16/2018 1845   HGBUR NEGATIVE 04/16/2018 1845   BILIRUBINUR NEGATIVE 04/16/2018 1845   BILIRUBINUR Negative 07/24/2016 [place] 04/16/2018 1845   PROTEINUR NEGATIVE 04/16/2018 1845   UROBILINOGEN negative 08/23/2014 1030   UROBILINOGEN 0.2 08/21/2010 1150   NITRITE NEGATIVE 04/16/2018 1845   LEUKOCYTESUR NEGATIVE 04/16/2018 1845   LEUKOCYTESUR Negative 07/24/2016 1110    Radiological Exams on Admission: Dg Chest 2 View  Result Date: 04/16/2018 CLINICAL DATA:  Possible sepsis EXAM: CHEST - 2 VIEW COMPARISON:  01/26/2018 FINDINGS: Post sternotomy changes. Mild cardiomegaly with aortic atherosclerosis. Linear scarring or atelectasis at the left base. Degenerative changes of the spine. No pneumothorax. IMPRESSION: Scarring at the left lung base.  Mild cardiomegaly. Electronically Signed   By: Donavan Foil M.D.   On: 04/16/2018 19:31    EKG: Independently reviewed.  Sinus tachycardia, nonspecific ST abnormalities.  Except rate, no significant change since prior tracing.  Assessment/Plan Principal Problem:   Sepsis ([place]) Active Problems:   HTN (hypertension)   Fever   Vomiting   Type 2 diabetes mellitus (HCC)   Fever secondary to possible transfusion associated sepsis vs febrile nonhemolytic transfusion reaction -Patient received an iron infusion yesterday without any complication.  Found to have a fever and altered mental status at home today.  Temperature 102.1 F and tachycardic in the ED. Tachycardia now resolved with IV fluid.  Blood pressure soft with MAP in the 60s-70s. White count 11.5.   Hemoglobin stable.  Lactic acid x2 normal.  UA not suggestive of infection.  Influenza panel negative.  Chest x-ray not suggestive of pneumonia. -Broad-spectrum antibiotics (vancomycin and cefepime) -Tylenol PRN -IV fluid resuscitation; keep MAP >65 -Blood culture x2 -Urine culture -Continue  to monitor CBC  Vomiting Patient is not complaining of abdominal pain; exam benign.  LFTs normal.  No further episodes of vomiting in the hospital.  Patient denies having any nausea and is requesting food to eat. -Continue to monitor  ?AMS History of dementia.  It was reported to EMS that patient was confused.  I spoke to the patient's daughter and she is not sure if he has been confused recently as she has not been to his house these past few days.  Patient is currently awake, alert, and in no distress.  He is oriented to person and place.  He knows the year and month.  No focal neuro deficit appreciated on exam. -Continue to monitor  Iron deficiency anemia Followed by Dr. Delton Coombes from oncology.  Patient received an iron infusion yesterday.  Hemoglobin 9.3, slightly improved since labs done on 1/13. -Continue to monitor CBC  Type 2 diabetes Random blood glucose 200. -Sliding scale insulin sensitive with bedtime coverage -CBG checks  Hypertension -Hold home antihypertensives at this time  CAD status post CABG -Continue home aspirin -Hold home beta-blocker at this time as blood pressure soft  Depression, mood disorder -Continue home Lexapro, risperidone  GERD -Continue PPI  DVT prophylaxis: Lovenox Code Status: Discussed with the patient's daughter over the phone who confirmed that the patient's CODE STATUS is DNR. Family Communication: Spoke to daughter [name] over the phone. Disposition Plan: Anticipate discharge after clinical improvement. Consults called: None Admission status: Observation, stepdown unit   Shela Leff MD [facility] Pager [phone]  If  7PM-7AM, please contact night-coverage [email or web address] Password [facility]  04/16/2018, 11:53 PM

## 2018-04-16 NOTE — ED Notes (Signed)
Called pharm to verify meds

## 2018-04-16 NOTE — ED Notes (Signed)
Spoke with Lelon Frohlich, pharmacist at [facility] hospital to release pt medications-  Barnett Applebaum, RN, [facility] aware of need for pt medications from upstairs pharmacy.

## 2018-04-16 NOTE — ED Notes (Signed)
Pt return from xr

## 2018-04-16 NOTE — ED Provider Notes (Addendum)
[facility] EMERGENCY DEPARTMENT Provider Note   CSN: [id number] Arrival date & time: 04/16/18  1801     History   Chief Complaint Chief Complaint  Patient presents with  . Weakness    HPI [name] is a 85 y.o. male.  HPI Patient with multiple medical issues, notably including dementia and iron deficiency anemia now presents with concern of fever, listlessness. Patient presents from his nursing facility, who provides history to EMS. Level 5 caveat secondary to the patient's dementia. Reportedly the patient had iron transfusion, yesterday, without complication. He was reportedly well until today, when he was found to have fever, altered mental status. The patient himself offers questions briefly, seemingly appropriately, but denies any elaboration on his presentation. He denies pain, nausea, focal weakness.  EMS reports the patient was febrile in route, with tachycardia, but no hypotension. Past Medical History:  Diagnosis Date  . Anemia    Iron deficiency  . Aortic stenosis    s/p pericardial AVR  . Aortic valve disorder 06/23/2008   Qualifier: Diagnosis of  By: Mare Ferrari, RMA, Sherri    . Arthritis   . Benign prostatic hypertrophy   . CAD (coronary artery disease) 01/30/2011  . Carotid bruit 08/21/2016  . COLONIC POLYPS 06/23/2008   Qualifier: Diagnosis of  By: Mare Ferrari, RMA, Sherri    . Coronary artery disease    s/p CABG 08/2010    . Diabetes mellitus   . Dyslipidemia 10/15/2017  . Essential hypertension 08/21/2016  . H/O aortic valve replacement 08/21/2016  . Hyperlipidemia   . Hyperlipidemia LDL goal <70 06/23/2008   Qualifier: Diagnosis of  By: Mare Ferrari, RMA, Sherri    . Leg swelling 10/15/2017  . Orthostatic hypotension 06/22/2012  . Palpitations 08/21/2016  . Precordial pain 09/18/2011  . Type 2 diabetes mellitus with hyperlipidemia ([place]) 06/23/2008   Qualifier: Diagnosis of  By: Mare Ferrari, RMA, Sherri    . Unstable angina ([place]) 07/24/2011    Patient Active Problem List     Diagnosis Date Noted  . Cerebrovascular accident (CVA) ([place]) 02/11/2018  . Dementia without behavioral disturbance ([place]) 01/27/2018  . Falls frequently 01/27/2018  . DNR (do not resuscitate) 01/27/2018  . Diabetes mellitus due to underlying condition with diabetic autonomic neuropathy, without long-term current use of insulin ([place]) 01/21/2018  . Depression 12/26/2017  . Sepsis ([place]) 12/26/2017  . Syncope   . Syncope and collapse   . Fall   . Microcytic anemia 12/25/2017  . Dyslipidemia 10/15/2017  . Leg swelling 10/15/2017  . Palpitations 08/21/2016  . Essential hypertension 08/21/2016  . Carotid bruit 08/21/2016  . H/O aortic valve replacement 08/21/2016  . Anemia, iron deficiency 04/11/2014  . Orthostatic hypotension 06/22/2012  . Dizzy 06/22/2012  . Precordial pain 09/18/2011  . Unstable angina ([place]) 07/24/2011  . CAD (coronary artery disease) 01/30/2011  . COLONIC POLYPS 06/23/2008  . Type 2 diabetes mellitus with hyperlipidemia ([place]) 06/23/2008  . Hyperlipidemia LDL goal <70 06/23/2008  . Aortic valve disorder 06/23/2008  . BENIGN PROSTATIC HYPERTROPHY, HX OF 06/23/2008    Past Surgical History:  Procedure Laterality Date  . AORTIC VALVE REPLACEMENT  08/24/10   85m pericardial tissue valve  . COLONOSCOPY WITH PROPOFOL N/A 12/28/2017   Procedure: COLONOSCOPY WITH PROPOFOL;  Surgeon: RRogene Houston MD;  [facility];  Service: Endoscopy;  Laterality: N/A;  . CORONARY ARTERY BYPASS GRAFT  08/24/10   LIMA to LAD, SVG to ramus intermediate, SVG to diagonal  . ESOPHAGOGASTRODUODENOSCOPY (EGD) WITH PROPOFOL N/A  12/28/2017   Procedure: ESOPHAGOGASTRODUODENOSCOPY (EGD) WITH PROPOFOL;  Surgeon: Rogene Houston, MD;  [facility];  Service: Endoscopy;  Laterality: N/A;  . HERNIA REPAIR    . knee replacements     x 2  . POLYPECTOMY  12/28/2017   Procedure: POLYPECTOMY;  Surgeon: Rogene Houston, MD;  [facility];  Service: Endoscopy;;   transverse colon         Home Medications    Prior to Admission medications   Medication Sig Start Date End Date Taking? Authorizing Provider  ACCU-CHEK SOFTCLIX LANCETS lancets Use as instructed 09/15/15   Chipper Herb, MD  aspirin 81 MG tablet Take 1 tablet (81 mg total) by mouth every morning. 12/25/17   Chipper Herb, MD  escitalopram (LEXAPRO) 20 MG tablet Take 20 mg by mouth daily.    [provider]  escitalopram (LEXAPRO) 5 MG tablet  03/30/18   [provider]  furosemide (LASIX) 20 MG tablet Take 40 mg on Mon, Wed, Fri and 20 mg all other days. 04/02/18   Chipper Herb, MD  glucose blood test strip CHECK BLOOD SUGAR UP TO 4 TIMES A DAY 03/31/18   Chipper Herb, MD  metFORMIN (GLUCOPHAGE) 500 MG tablet Take 1 tablet (500 mg total) by mouth 2 (two) times daily. 12/25/17   Chipper Herb, MD  metoprolol succinate (TOPROL-XL) 25 MG 24 hr tablet Take 0.5 tablets (12.5 mg total) by mouth daily before breakfast. 01/21/18   Chipper Herb, MD  nitroGLYCERIN (NITROSTAT) 0.4 MG SL tablet Place 1 tablet (0.4 mg total) under the tongue every 5 (five) minutes x 3 doses as needed for chest pain. Patient not taking: Reported on 04/02/2018 08/21/16   Minus Breeding, MD  pantoprazole (PROTONIX) 40 MG tablet TAKE (1) TABLET TWICE A DAY BEFOR A MEAL 02/24/18   Chipper Herb, MD  polyethylene glycol [facility] / Floria Raveling) packet Take 17 g by mouth daily. Patient not taking: Reported on 04/10/2018 02/06/18   Chipper Herb, MD  risperiDONE (RISPERDAL) 0.25 MG tablet Take 1 tablet (0.25 mg total) by mouth 3 (three) times daily. 04/02/18   Chipper Herb, MD  rosuvastatin (CRESTOR) 40 MG tablet Take 1 tablet (40 mg total) by mouth every evening. 12/25/17 04/02/18  Chipper Herb, MD    Family History Family History  Problem Relation Age of Onset  . Cancer Mother   . Diabetes Father   . Hypertension Other   . Diabetes Other     Social History Social History   Tobacco Use  .  Smoking status: Former Smoker    Types: Cigars    Last attempt to quit: 09/17/1993    Years since quitting: 24.5  . Smokeless tobacco: Never Used  Substance Use Topics  . Alcohol use: Yes    Comment: occassional  . Drug use: No     Allergies   Patient has no known allergies.   Review of Systems Review of Systems  Unable to perform ROS: Dementia     Physical Exam Updated Vital Signs BP (!) 101/55   Pulse (!) 122   Temp 98.4 F (36.9 C) (Oral)   Resp 20   Ht '5\' 7"'  (1.702 m)   Wt 77.5 kg   SpO2 95%   BMI 26.76 kg/m   Physical Exam Vitals signs and nursing note reviewed.  Constitutional:      Appearance: He is ill-appearing.  HENT:     Head: Normocephalic  and atraumatic.  Eyes:     Conjunctiva/sclera: Conjunctivae normal.  Cardiovascular:     Rate and Rhythm: Regular rhythm. Tachycardia present.  Pulmonary:     Effort: Pulmonary effort is normal. No respiratory distress.     Breath sounds: No stridor.  Abdominal:     General: There is no distension.  Skin:    General: Skin is warm and dry.  Neurological:     Mental Status: He is alert.     Motor: Atrophy present.  Psychiatric:        Cognition and Memory: Cognition is impaired.      ED Treatments / Results  Labs (all labs ordered are listed, but only abnormal results are displayed) Labs Reviewed  COMPREHENSIVE METABOLIC PANEL - Abnormal; Notable for the following components:      Result Value   Sodium 133 (*)    Glucose, Bld 200 (*)    Albumin 3.3 (*)    GFR calc non Af Amer 59 (*)    All other components within normal limits  CBC WITH DIFFERENTIAL/PLATELET - Abnormal; Notable for the following components:   WBC 11.5 (*)    RBC 3.20 (*)    Hemoglobin 9.3 (*)    HCT 28.9 (*)    Neutro Abs 9.6 (*)    All other components within normal limits  CULTURE, BLOOD (ROUTINE X 2)  CULTURE, BLOOD (ROUTINE X 2)  LACTIC ACID, PLASMA  LACTIC ACID, PLASMA  URINALYSIS, ROUTINE W REFLEX MICROSCOPIC    INFLUENZA PANEL BY PCR (TYPE A & B)  CBC  BASIC METABOLIC PANEL    EKG None  Radiology Dg Chest 2 View  Result Date: 04/16/2018 CLINICAL DATA:  Possible sepsis EXAM: CHEST - 2 VIEW COMPARISON:  01/26/2018 FINDINGS: Post sternotomy changes. Mild cardiomegaly with aortic atherosclerosis. Linear scarring or atelectasis at the left base. Degenerative changes of the spine. No pneumothorax. IMPRESSION: Scarring at the left lung base.  Mild cardiomegaly. Electronically Signed   By: Donavan Foil M.D.   On: 04/16/2018 19:31    Procedures Procedures (including critical care time)  Medications Ordered in ED Medications  vancomycin (VANCOCIN) 2,000 mg in sodium chloride 0.9 % 500 mL IVPB (2,000 mg Intravenous New Bag/Given 04/16/18 2007)  ceFEPIme (MAXIPIME) 2 g in sodium chloride 0.9 % 100 mL IVPB (has no administration in time range)  vancomycin (VANCOCIN) IVPB 1000 mg/200 mL premix (has no administration in time range)  sodium chloride 0.9 % bolus 1,000 mL (has no administration in time range)  sodium chloride 0.9 % bolus 500 mL (has no administration in time range)  ceFEPIme (MAXIPIME) 2 g in sodium chloride 0.9 % 100 mL IVPB (0 g Intravenous Stopped 04/16/18 2007)  acetaminophen (TYLENOL) tablet 1,000 mg (1,000 mg Oral Given 04/16/18 1836)     Initial Impression / Assessment and Plan / ED Course  I have reviewed the triage vital signs and the nursing notes.  Pertinent labs & imaging results that were available during my care of the patient were reviewed by me and considered in my medical decision making (see chart for details).     9:15 PM Patient much more calm, fever has subsided, heart rate has diminished, patient has received fluid resuscitation, broad-spectrum antibiotics. Patient denies ongoing complaints.  Pressure has diminished somewhat since arrival, but heart rate has also gone down.  This elderly male presents 1 day after iron infusion, now with concern for fever,  decreased strength, nausea, vomiting. Patient's dementia makes some of the evaluation  difficult, but given his initial fever, tachycardia, concern for sepsis versus infusion reaction (patient met multiple sirs criteria.) Patient received broad-spectrum antibiotics, fluids, Tylenol, had substantial improvement, given his illness, required admission for further monitoring, management.  Final Clinical Impressions(s) / ED Diagnoses  SIRS   Carmin Muskrat, MD 04/16/18 2117    Carmin Muskrat, MD 04/16/18 2138  CRITICAL CARE Performed by: Carmin Muskrat Total critical care time: 40 minutes Critical care time was exclusive of separately billable procedures and treating other patients. Critical care was necessary to treat or prevent imminent or life-threatening deterioration. Critical care was time spent personally by me on the following activities: development of treatment plan with patient and/or surrogate as well as nursing, discussions with consultants, evaluation of patient's response to treatment, examination of patient, obtaining history from patient or surrogate, ordering and performing treatments and interventions, ordering and review of laboratory studies, ordering and review of radiographic studies, pulse oximetry and re-evaluation of patient's condition.     Carmin Muskrat, MD 04/30/18 [phone]

## 2018-04-17 ENCOUNTER — Other Ambulatory Visit (HOSPITAL_COMMUNITY): Payer: Self-pay

## 2018-04-17 ENCOUNTER — Other Ambulatory Visit (HOSPITAL_COMMUNITY): Payer: Self-pay | Admitting: Emergency Medicine

## 2018-04-17 DIAGNOSIS — I10 Essential (primary) hypertension: Secondary | ICD-10-CM | POA: Diagnosis not present

## 2018-04-17 DIAGNOSIS — B349 Viral infection, unspecified: Secondary | ICD-10-CM | POA: Diagnosis not present

## 2018-04-17 DIAGNOSIS — E119 Type 2 diabetes mellitus without complications: Secondary | ICD-10-CM | POA: Diagnosis not present

## 2018-04-17 DIAGNOSIS — D509 Iron deficiency anemia, unspecified: Secondary | ICD-10-CM

## 2018-04-17 DIAGNOSIS — R6510 Systemic inflammatory response syndrome (SIRS) of non-infectious origin without acute organ dysfunction: Secondary | ICD-10-CM

## 2018-04-17 DIAGNOSIS — E1165 Type 2 diabetes mellitus with hyperglycemia: Secondary | ICD-10-CM | POA: Diagnosis not present

## 2018-04-17 LAB — BLOOD CULTURE ID PANEL (REFLEXED)
Acinetobacter baumannii: NOT DETECTED
Candida albicans: NOT DETECTED
Candida glabrata: NOT DETECTED
Candida krusei: NOT DETECTED
Candida parapsilosis: NOT DETECTED
Candida tropicalis: NOT DETECTED
ENTEROBACTER CLOACAE COMPLEX: NOT DETECTED
ENTEROCOCCUS SPECIES: NOT DETECTED
Enterobacteriaceae species: NOT DETECTED
Escherichia coli: NOT DETECTED
Haemophilus influenzae: NOT DETECTED
Klebsiella oxytoca: NOT DETECTED
Klebsiella pneumoniae: NOT DETECTED
LISTERIA MONOCYTOGENES: NOT DETECTED
Methicillin resistance: NOT DETECTED
Neisseria meningitidis: NOT DETECTED
Proteus species: NOT DETECTED
Pseudomonas aeruginosa: NOT DETECTED
STREPTOCOCCUS AGALACTIAE: NOT DETECTED
Serratia marcescens: NOT DETECTED
Staphylococcus aureus (BCID): NOT DETECTED
Staphylococcus species: DETECTED — AB
Streptococcus pneumoniae: NOT DETECTED
Streptococcus pyogenes: NOT DETECTED
Streptococcus species: NOT DETECTED

## 2018-04-17 LAB — BASIC METABOLIC PANEL
ANION GAP: 6 (ref 5–15)
BUN: 18 mg/dL (ref 8–23)
CALCIUM: 8 mg/dL — AB (ref 8.9–10.3)
CO2: 23 mmol/L (ref 22–32)
Chloride: 109 mmol/L (ref 98–111)
Creatinine, Ser: 0.99 mg/dL (ref 0.61–1.24)
GFR calc Af Amer: >60 mL/min (ref >60)
GFR calc non Af Amer: >60 mL/min (ref >60)
Glucose, Bld: 118 mg/dL — ABNORMAL HIGH (ref 70–99)
Potassium: 3.4 mmol/L — ABNORMAL LOW (ref 3.5–5.1)
Sodium: 138 mmol/L (ref 135–145)

## 2018-04-17 LAB — CBG MONITORING, ED
Glucose-Capillary: 94 mg/dL (ref 70–99)
Glucose-Capillary: 151 mg/dL — ABNORMAL HIGH (ref 70–99)
Glucose-Capillary: 123 mg/dL — ABNORMAL HIGH (ref 70–99)

## 2018-04-17 LAB — CBC
HCT: 23.7 % — ABNORMAL LOW (ref 39.0–52.0)
Hemoglobin: 7.5 g/dL — ABNORMAL LOW (ref 13.0–17.0)
MCH: 28.7 pg (ref 26.0–34.0)
MCHC: 31.6 g/dL (ref 30.0–36.0)
MCV: 90.8 fL (ref 80.0–100.0)
NRBC: 0 % (ref 0.0–0.2)
Platelets: 146 10*3/uL — ABNORMAL LOW (ref 150–400)
RBC: 2.61 MIL/uL — ABNORMAL LOW (ref 4.22–5.81)
RDW: 14.7 % (ref 11.5–15.5)
WBC: 8.2 10*3/uL (ref 4.0–10.5)

## 2018-04-17 LAB — PREPARE RBC (CROSSMATCH)

## 2018-04-17 MED ORDER — FUROSEMIDE 10 MG/ML IJ SOLN
20.0000 mg | Freq: Once | INTRAMUSCULAR | Status: AC
  Administered 2018-04-17: 20 mg via INTRAVENOUS
  Filled 2018-04-17: qty 2

## 2018-04-17 MED ORDER — METFORMIN HCL 500 MG PO TABS
500.0000 mg | ORAL_TABLET | Freq: Two times a day (BID) | ORAL | Status: DC
  Administered 2018-04-17: 500 mg via ORAL
  Filled 2018-04-17: qty 1

## 2018-04-17 MED ORDER — RISPERIDONE 0.5 MG PO TABS
ORAL_TABLET | ORAL | Status: AC
  Filled 2018-04-17: qty 1

## 2018-04-17 MED ORDER — FUROSEMIDE 20 MG PO TABS: 20.0000 mg | ORAL_TABLET | Freq: Every day | ORAL | Status: DC

## 2018-04-17 MED ORDER — SODIUM CHLORIDE 0.9% IV SOLUTION
Freq: Once | INTRAVENOUS | Status: AC
  Administered 2018-04-17: 21:00:00 via INTRAVENOUS

## 2018-04-17 NOTE — ED Notes (Signed)
Per Microbiology at [facility], blood cultures positive for staphylococcus, Mec A negative

## 2018-04-17 NOTE — ED Notes (Signed)
Lab called and pt is positive for antibodies. Blood transfusion has to be sent from [facility] and should arrive at [facility] in 2 hours or so. PT and family made aware. PT to be d/c to home after transfusion completed.

## 2018-04-17 NOTE — ED Notes (Signed)
CRITICAL VALUE ALERT  Critical Value: anaerobic blood culture bottle positive for gram positive cocci.   Date & Time Notied: 04/17/2018, 1408, sent via text  Provider Notified: Dr. Dyann Kief   Orders Received/Actions taken: see chart

## 2018-04-17 NOTE — ED Notes (Signed)
Pt given urinal.

## 2018-04-17 NOTE — Discharge Summary (Signed)
Physician Discharge Summary  BARTLETT ENKE QVZ:[id number] DOB: 13-Jan-1933 DOA: 04/16/2018  PCP: Chipper Herb, MD  Admit date: 04/16/2018 Discharge date: 04/17/2018  Time spent: 35 minutes  Recommendations for Outpatient Follow-up:  1. Repeat CBC to follow hemoglobin trend 2. Repeat basic metabolic panel to follow electrolytes and renal function 3. Reassess blood pressure and volume status with further adjustment to his antihypertensive regimen and diuretics as needed.   Discharge Diagnoses:  Principal Problem:   SIRS (systemic inflammatory response syndrome) (HCC) Active Problems:   HTN (hypertension)   Fever   Vomiting   Type 2 diabetes mellitus ([place])   Discharge Condition: Stable and improved.  Patient discharged home after transfusion 1 unit of PRBCs with instructions to follow-up with PCP and hematologist as an outpatient.  Diet recommendation: Heart healthy and modified carbohydrates diet  Filed Weights   04/16/18 1809  Weight: 77.5 kg    History of present illness:  As per H&P written by Dr. Marlowe Sax on 04/16/2018  85 y.o. male with medical history significant of dementia, iron deficiency anemia, aortic stenosis status post valve replacement, BPH, CAD status post CABG, type 2 diabetes, hypertension, hyperlipidemia presenting to the hospital for evaluation of fever and generalized weakness.  History per EMS: Patient had an iron transfusion yesterday without any complication.  He was reportedly well until today when he was found to have a fever and altered mental status. Also having nausea and vomiting.   Patient has baseline dementia and it was difficult to obtain a history from him.  He was not sure why he is at the hospital.  Denied having any fevers, chills, chest pain, shortness of breath, abdominal pain, or fatigue.  States he vomited once yesterday but is currently not nauseous anymore and wants to eat.  No additional history could be obtained from him.  I spoke to the  patient's daughter [name] over the phone.  She informed me that the patient lives on his own and a caregiver checks on him.  She was not able to provide any additional history as she has not been at the patient's home in these past few days.  Stated patient has baseline dementia and she is not sure if he has been more confused recently.  Hospital Course:  1-SIRS -No real source of infection identified -Most likely associated with a viral infection -Discussed with family members for potential red flags that can bring patient back to the hospital or to pursued any further medical attention. -Fluid resuscitation and the use of as needed Tylenol recommended. -At this moment given the lack of source for infection no antibiotic has been prescribed. -Patient had 1 out of 2 blood cultures anaerobic only; positive for MSSA coagulase-negative microorganism suggestive of contaminant. -Outpatient follow-up with PCP in 10 days.  2-vomiting -Isolated event most likely associated with viral infection -Influenza came back to be negative -Patient without any further nausea, vomiting, abdominal pain or any other complaints -Tolerated diet without problem and I have demonstrated to be able to maintain adequate hydration by mouth.  3-altered mental status -Most likely associated with episode of fever -Patient with underlying history of dementia -Continue supportive care, assistance and supervision. -According to patient's daughter he had an upcoming appointment to see a neurologist as an outpatient to further evaluate and initiate treatment if found appropriate for it.  4-iron deficiency anemia -Recent iron infusion provided -Hemoglobin 7.5 -Part of his weakness symptoms on presentation most likely associated with ongoing anemia -Threshold for transfusion is 7.5 (  had history of coronary artery disease) -Case discussed with his hematologist, recommendations given to transfuse 1 unit of PRBCs and had patient  follow-up next week as an outpatient. -No signs of acute bleeding appreciated.  5-history of coronary artery disease -No chest pain or shortness of breath -EKG without acute irregularity -Will continue the use of aspirin, beta-blocker and statins.  6-gastroesophageal reflux disease -Continue PPI  7-Depression/mood disorder -Continue Lexapro and risperidone.  8-type 2 diabetes -Continue the use of metformin -Modified carbohydrate has been recommended -Patient advised to maintain adequate hydration.  Procedures:  See below for x-ray reports.  Consultations:  Dr. Delton Coombes (hematologist was curbside).  Discharge Exam: Vitals:   04/17/18 1400 04/17/18 1430  BP: 110/70 100/68  Pulse: 90 85  Resp: 19 18  Temp:    SpO2: 96% 97%    General: Has remained afebrile, mentation back to his baseline as per family members information at bedside.  Following commands appropriately, no chest pain, no shortness of breath, no nausea, no vomiting. Cardiovascular: S1 and S2, positive systolic murmur, no rubs, no gallops; no JVD on exam. Respiratory: Good air movement bilaterally, positive rhonchi, no wheezing, no crackles, good oxygen saturation on room air. Abdomen: Soft, nontender, and distended, positive bowel sounds Extremities: No cyanosis, no clubbing, trace edema bilaterally   Discharge Instructions   Discharge Instructions    (HEART FAILURE PATIENTS) Call MD:  Anytime you have any of the following symptoms: 1) 3 pound weight gain in 24 hours or 5 pounds in 1 week 2) shortness of breath, with or without a dry hacking cough 3) swelling in the hands, feet or stomach 4) if you have to sleep on extra pillows at night in order to breathe.   Complete by:  As directed    Diet - low sodium heart healthy   Complete by:  As directed    Discharge instructions   Complete by:  As directed    No recent adjustment on your diuretic regimen Follow low-sodium diet Maintain adequate  hydration -Do not forget to use stocking socks and keep legs elevated as much as possible to assist with venous insufficiency/swelling. Arrange follow-up with PCP in 10 days -Call hematology office to arrange follow-up next week for your anemia.     Allergies as of 04/17/2018   No Known Allergies     Medication List    TAKE these medications   ACCU-CHEK SOFTCLIX LANCETS lancets Use as instructed   aspirin 81 MG tablet Take 1 tablet (81 mg total) by mouth every morning.   escitalopram 20 MG tablet Commonly known as:  LEXAPRO Take 20 mg by mouth daily.   furosemide 20 MG tablet Commonly known as:  LASIX Take 1 tablet (20 mg total) by mouth daily. Take 40 mg on Mon, Wed, Fri and 20 mg all other days. What changed:    how much to take  how to take this  when to take this   glucose blood test strip CHECK BLOOD SUGAR UP TO 4 TIMES A DAY   metFORMIN 500 MG tablet Commonly known as:  GLUCOPHAGE Take 1 tablet (500 mg total) by mouth 2 (two) times daily.   metoprolol succinate 25 MG 24 hr tablet Commonly known as:  TOPROL-XL Take 0.5 tablets (12.5 mg total) by mouth daily before breakfast.   nitroGLYCERIN 0.4 MG SL tablet Commonly known as:  NITROSTAT Place 1 tablet (0.4 mg total) under the tongue every 5 (five) minutes x 3 doses as needed for chest pain.  pantoprazole 40 MG tablet Commonly known as:  PROTONIX TAKE (1) TABLET TWICE A DAY BEFOR A MEAL   polyethylene glycol packet Commonly known as:  MIRALAX / GLYCOLAX Take 17 g by mouth daily.   risperiDONE 0.25 MG tablet Commonly known as:  RISPERDAL Take 1 tablet (0.25 mg total) by mouth 3 (three) times daily.   rosuvastatin 40 MG tablet Commonly known as:  CRESTOR Take 1 tablet (40 mg total) by mouth every evening.      No Known Allergies Follow-up Information    Chipper Herb, MD. Schedule an appointment as soon as possible for a visit in 10 day(s).   Specialty:  Family Medicine Contact  information: [place] [address] [phone]        Minus Breeding, MD .   Specialty:  Cardiology Contact information: [address] [place] [address] [phone]        Derek Jack, MD. Call today.   Specialty:  Hematology Why:  to schedule follow up visit next week (appointment maybe Wednesday or Thursday if possible) Contact information: [address] [place] [address] [phone]           The results of significant diagnostics from this hospitalization (including imaging, microbiology, ancillary and laboratory) are listed below for reference.    Significant Diagnostic Studies: Dg Chest 2 View  Result Date: 04/16/2018 CLINICAL DATA:  Possible sepsis EXAM: CHEST - 2 VIEW COMPARISON:  01/26/2018 FINDINGS: Post sternotomy changes. Mild cardiomegaly with aortic atherosclerosis. Linear scarring or atelectasis at the left base. Degenerative changes of the spine. No pneumothorax. IMPRESSION: Scarring at the left lung base.  Mild cardiomegaly. Electronically Signed   By: Donavan Foil M.D.   On: 04/16/2018 19:31    Microbiology: Recent Results (from the past 240 hour(s))  Blood Culture (routine x 2)     Status: None (Preliminary result)   Collection Time: 04/16/18  6:30 PM  Result Value Ref Range Status   Specimen Description BLOOD LEFT WRIST  Final   Special Requests   Final    BOTTLES DRAWN AEROBIC AND ANAEROBIC Blood Culture adequate volume   Culture  Setup Time   Final    GRAM POSITIVE COCCI AEROBIC , ANAEROBIC Gram Stain Report Called to,Read Back By and Verified With: KEMP @ [id number]  [id number] BY HENDERSON L. Performed at [facility], [address], [place], [place] [address]    Culture PENDING  Incomplete   Report Status PENDING  Incomplete  Blood Culture (routine x 2)     Status: None (Preliminary result)   Collection Time: 04/16/18  6:38 PM  Result Value Ref Range Status   Specimen Description LEFT ANTECUBITAL   Final   Special Requests   Final    BOTTLES DRAWN AEROBIC AND ANAEROBIC Blood Culture adequate volume   Culture  Setup Time   Final    GRAM POSITIVE COCCI ANAEROBIC Gram Stain Report Called to,Read Back By and Verified With: CARDWELL L. @1401  ON [id number] BY HENDERSON L. Organism ID to follow GRAM POSITIVE COCCI AEROBIC RESULT PREV. CALLED Performed at [facility], [address], [place],  [address]    Culture PENDING  Incomplete   Report Status PENDING  Incomplete  Blood Culture ID Panel (Reflexed)     Status: Abnormal   Collection Time: 04/16/18  6:38 PM  Result Value Ref Range Status   Enterococcus species NOT DETECTED NOT DETECTED Final   Listeria monocytogenes NOT DETECTED NOT DETECTED Final  Staphylococcus species DETECTED (A) NOT DETECTED Final    Comment: Methicillin (oxacillin) susceptible coagulase negative staphylococcus. Possible blood culture contaminant (unless isolated from more than one blood culture draw or clinical case suggests pathogenicity). No antibiotic treatment is indicated for blood  culture contaminants. CRITICAL RESULT CALLED TO, READ BACK BY AND VERIFIED WITH: C KEMP RN ([facility]) 04/17/18 1728 JDW    Staphylococcus aureus (BCID) NOT DETECTED NOT DETECTED Final   Methicillin resistance NOT DETECTED NOT DETECTED Final   Streptococcus species NOT DETECTED NOT DETECTED Final   Streptococcus agalactiae NOT DETECTED NOT DETECTED Final   Streptococcus pneumoniae NOT DETECTED NOT DETECTED Final   Streptococcus pyogenes NOT DETECTED NOT DETECTED Final   Acinetobacter baumannii NOT DETECTED NOT DETECTED Final   Enterobacteriaceae species NOT DETECTED NOT DETECTED Final   Enterobacter cloacae complex NOT DETECTED NOT DETECTED Final   Escherichia coli NOT DETECTED NOT DETECTED Final   Klebsiella oxytoca NOT DETECTED NOT DETECTED Final   Klebsiella pneumoniae NOT DETECTED NOT DETECTED Final   Proteus species NOT DETECTED NOT DETECTED Final   Serratia marcescens  NOT DETECTED NOT DETECTED Final   Haemophilus influenzae NOT DETECTED NOT DETECTED Final   Neisseria meningitidis NOT DETECTED NOT DETECTED Final   Pseudomonas aeruginosa NOT DETECTED NOT DETECTED Final   Candida albicans NOT DETECTED NOT DETECTED Final   Candida glabrata NOT DETECTED NOT DETECTED Final   Candida krusei NOT DETECTED NOT DETECTED Final   Candida parapsilosis NOT DETECTED NOT DETECTED Final   Candida tropicalis NOT DETECTED NOT DETECTED Final    Comment: Performed at [facility], [place] [address] [place], [place] [address]     Labs: Basic Metabolic Panel: Recent Labs  Lab 04/16/18 1837 04/17/18 0553  NA 133* 138  K 3.7 3.4*  CL 100 109  CO2 24 23  GLUCOSE 200* 118*  BUN 21 18  CREATININE 1.14 0.99  CALCIUM 8.9 8.0*   Liver Function Tests: Recent Labs  Lab 04/16/18 1837  AST 26  ALT 20  ALKPHOS 80  BILITOT 0.9  PROT 6.5  ALBUMIN 3.3*   CBC: Recent Labs  Lab 04/16/18 1837 04/17/18 0553  WBC 11.5* 8.2  NEUTROABS 9.6*  --   HGB 9.3* 7.5*  HCT 28.9* 23.7*  MCV 90.3 90.8  PLT 190 146*   BNP (last 3 results) Recent Labs    12/19/17 1350 01/06/18 1812 04/02/18 1508  BNP 78.0 128.0* 211.1*   CBG: Recent Labs  Lab 04/17/18 0809 04/17/18 1045 04/17/18 1510  GLUCAP 94 151* 123*    Signed:  Barton Dubois MD.  [facility] 04/17/2018, 6:32 PM

## 2018-04-17 NOTE — Care Management Obs Status (Signed)
[place] NOTIFICATION   Patient Details  Name: [name] MRN: [id number] Date of Birth: Oct 25, 1932   Medicare Observation Status Notification Given:  Yes(verbal signature from daughter via phone: [name])    Fransico Sciandra, Chauncey Reading, RN 04/17/2018, 3:44 PM

## 2018-04-18 ENCOUNTER — Emergency Department (HOSPITAL_COMMUNITY): Payer: Medicare HMO

## 2018-04-18 ENCOUNTER — Other Ambulatory Visit: Payer: Self-pay

## 2018-04-18 ENCOUNTER — Encounter (HOSPITAL_COMMUNITY): Payer: Self-pay

## 2018-04-18 ENCOUNTER — Inpatient Hospital Stay (HOSPITAL_COMMUNITY)
Admission: EM | Admit: 2018-04-18 | Discharge: 2018-04-22 | DRG: 871 | Disposition: A | Discharge status: Home Health | Payer: Medicare HMO | Attending: Internal Medicine | Admitting: Internal Medicine

## 2018-04-18 DIAGNOSIS — Z833 Family history of diabetes mellitus: Secondary | ICD-10-CM | POA: Diagnosis not present

## 2018-04-18 DIAGNOSIS — Z7982 Long term (current) use of aspirin: Secondary | ICD-10-CM | POA: Diagnosis not present

## 2018-04-18 DIAGNOSIS — I249 Acute ischemic heart disease, unspecified: Secondary | ICD-10-CM | POA: Diagnosis not present

## 2018-04-18 DIAGNOSIS — G92 Toxic encephalopathy: Secondary | ICD-10-CM | POA: Diagnosis present

## 2018-04-18 DIAGNOSIS — Z66 Do not resuscitate: Secondary | ICD-10-CM | POA: Diagnosis present

## 2018-04-18 DIAGNOSIS — F0390 Unspecified dementia without behavioral disturbance: Secondary | ICD-10-CM | POA: Diagnosis present

## 2018-04-18 DIAGNOSIS — I361 Nonrheumatic tricuspid (valve) insufficiency: Secondary | ICD-10-CM | POA: Diagnosis not present

## 2018-04-18 DIAGNOSIS — R7881 Bacteremia: Secondary | ICD-10-CM | POA: Diagnosis present

## 2018-04-18 DIAGNOSIS — E119 Type 2 diabetes mellitus without complications: Secondary | ICD-10-CM

## 2018-04-18 DIAGNOSIS — A419 Sepsis, unspecified organism: Secondary | ICD-10-CM | POA: Diagnosis present

## 2018-04-18 DIAGNOSIS — I2489 Other forms of acute ischemic heart disease: Secondary | ICD-10-CM | POA: Diagnosis present

## 2018-04-18 DIAGNOSIS — I342 Nonrheumatic mitral (valve) stenosis: Secondary | ICD-10-CM | POA: Diagnosis not present

## 2018-04-18 DIAGNOSIS — E785 Hyperlipidemia, unspecified: Secondary | ICD-10-CM | POA: Diagnosis present

## 2018-04-18 DIAGNOSIS — D509 Iron deficiency anemia, unspecified: Secondary | ICD-10-CM | POA: Diagnosis present

## 2018-04-18 DIAGNOSIS — N400 Enlarged prostate without lower urinary tract symptoms: Secondary | ICD-10-CM | POA: Diagnosis present

## 2018-04-18 DIAGNOSIS — Z79899 Other long term (current) drug therapy: Secondary | ICD-10-CM | POA: Diagnosis not present

## 2018-04-18 DIAGNOSIS — Z951 Presence of aortocoronary bypass graft: Secondary | ICD-10-CM | POA: Diagnosis present

## 2018-04-18 DIAGNOSIS — E1169 Type 2 diabetes mellitus with other specified complication: Secondary | ICD-10-CM | POA: Diagnosis present

## 2018-04-18 DIAGNOSIS — E0843 Diabetes mellitus due to underlying condition with diabetic autonomic (poly)neuropathy: Secondary | ICD-10-CM

## 2018-04-18 DIAGNOSIS — I10 Essential (primary) hypertension: Secondary | ICD-10-CM | POA: Diagnosis present

## 2018-04-18 DIAGNOSIS — Z86010 Personal history of colonic polyps: Secondary | ICD-10-CM | POA: Diagnosis not present

## 2018-04-18 DIAGNOSIS — Z8719 Personal history of other diseases of the digestive system: Secondary | ICD-10-CM | POA: Diagnosis not present

## 2018-04-18 DIAGNOSIS — Z8249 Family history of ischemic heart disease and other diseases of the circulatory system: Secondary | ICD-10-CM | POA: Diagnosis not present

## 2018-04-18 DIAGNOSIS — Z8673 Personal history of transient ischemic attack (TIA), and cerebral infarction without residual deficits: Secondary | ICD-10-CM | POA: Diagnosis not present

## 2018-04-18 DIAGNOSIS — Z952 Presence of prosthetic heart valve: Secondary | ICD-10-CM

## 2018-04-18 DIAGNOSIS — I248 Other forms of acute ischemic heart disease: Secondary | ICD-10-CM | POA: Diagnosis not present

## 2018-04-18 DIAGNOSIS — I959 Hypotension, unspecified: Secondary | ICD-10-CM | POA: Diagnosis present

## 2018-04-18 DIAGNOSIS — F329 Major depressive disorder, single episode, unspecified: Secondary | ICD-10-CM | POA: Diagnosis present

## 2018-04-18 DIAGNOSIS — R509 Fever, unspecified: Secondary | ICD-10-CM | POA: Diagnosis not present

## 2018-04-18 DIAGNOSIS — I350 Nonrheumatic aortic (valve) stenosis: Secondary | ICD-10-CM | POA: Diagnosis present

## 2018-04-18 DIAGNOSIS — A411 Sepsis due to other specified staphylococcus: Secondary | ICD-10-CM | POA: Diagnosis present

## 2018-04-18 DIAGNOSIS — I25709 Atherosclerosis of coronary artery bypass graft(s), unspecified, with unspecified angina pectoris: Secondary | ICD-10-CM | POA: Diagnosis not present

## 2018-04-18 DIAGNOSIS — B9561 Methicillin susceptible Staphylococcus aureus infection as the cause of diseases classified elsewhere: Secondary | ICD-10-CM | POA: Diagnosis not present

## 2018-04-18 DIAGNOSIS — R4182 Altered mental status, unspecified: Secondary | ICD-10-CM | POA: Diagnosis present

## 2018-04-18 DIAGNOSIS — I359 Nonrheumatic aortic valve disorder, unspecified: Secondary | ICD-10-CM | POA: Diagnosis not present

## 2018-04-18 DIAGNOSIS — R404 Transient alteration of awareness: Secondary | ICD-10-CM

## 2018-04-18 DIAGNOSIS — E782 Mixed hyperlipidemia: Secondary | ICD-10-CM | POA: Diagnosis not present

## 2018-04-18 DIAGNOSIS — I2510 Atherosclerotic heart disease of native coronary artery without angina pectoris: Secondary | ICD-10-CM | POA: Diagnosis present

## 2018-04-18 DIAGNOSIS — I25810 Atherosclerosis of coronary artery bypass graft(s) without angina pectoris: Secondary | ICD-10-CM | POA: Diagnosis not present

## 2018-04-18 DIAGNOSIS — E876 Hypokalemia: Secondary | ICD-10-CM | POA: Diagnosis present

## 2018-04-18 DIAGNOSIS — I214 Non-ST elevation (NSTEMI) myocardial infarction: Secondary | ICD-10-CM | POA: Diagnosis present

## 2018-04-18 DIAGNOSIS — A4101 Sepsis due to Methicillin susceptible Staphylococcus aureus: Secondary | ICD-10-CM | POA: Diagnosis not present

## 2018-04-18 LAB — PROTIME-INR
INR: 1.14
Prothrombin Time: 14.5 seconds (ref 11.4–15.2)

## 2018-04-18 LAB — URINALYSIS, ROUTINE W REFLEX MICROSCOPIC
Bilirubin Urine: NEGATIVE
Glucose, UA: NEGATIVE mg/dL
Ketones, ur: 20 mg/dL — AB
Leukocytes, UA: NEGATIVE
Nitrite: NEGATIVE
Protein, ur: 30 mg/dL — AB
Specific Gravity, Urine: 1.017 (ref 1.005–1.030)
pH: 5 (ref 5.0–8.0)

## 2018-04-18 LAB — TROPONIN I
Troponin I: 4.48 ng/mL (ref <0.03)
Troponin I: 4.01 ng/mL (ref <0.03)
Troponin I: 3.59 ng/mL (ref <0.03)

## 2018-04-18 LAB — CBC WITH DIFFERENTIAL/PLATELET
Abs Immature Granulocytes: 0.04 10*3/uL (ref 0.00–0.07)
BASOS ABS: 0 10*3/uL (ref 0.0–0.1)
Basophils Relative: 0 %
Eosinophils Absolute: 0 10*3/uL (ref 0.0–0.5)
Eosinophils Relative: 0 %
HCT: 29.9 % — ABNORMAL LOW (ref 39.0–52.0)
HEMOGLOBIN: 9.6 g/dL — AB (ref 13.0–17.0)
Immature Granulocytes: 1 %
LYMPHS ABS: 0.6 10*3/uL — AB (ref 0.7–4.0)
Lymphocytes Relative: 7 %
MCH: 28.3 pg (ref 26.0–34.0)
MCHC: 32.1 g/dL (ref 30.0–36.0)
MCV: 88.2 fL (ref 80.0–100.0)
Monocytes Absolute: 0.6 10*3/uL (ref 0.1–1.0)
Monocytes Relative: 7 %
Neutro Abs: 7 10*3/uL (ref 1.7–7.7)
Neutrophils Relative %: 85 %
Platelets: 149 10*3/uL — ABNORMAL LOW (ref 150–400)
RBC: 3.39 MIL/uL — ABNORMAL LOW (ref 4.22–5.81)
RDW: 15.2 % (ref 11.5–15.5)
WBC: 8.3 10*3/uL (ref 4.0–10.5)
nRBC: 0 % (ref 0.0–0.2)

## 2018-04-18 LAB — INFLUENZA PANEL BY PCR (TYPE A & B)
Influenza A By PCR: NEGATIVE
Influenza B By PCR: NEGATIVE

## 2018-04-18 LAB — MAGNESIUM: Magnesium: 1.6 mg/dL — ABNORMAL LOW (ref 1.7–2.4)

## 2018-04-18 LAB — COMPREHENSIVE METABOLIC PANEL
ALBUMIN: 2.7 g/dL — AB (ref 3.5–5.0)
ALT: 20 U/L (ref 0–44)
AST: 38 U/L (ref 15–41)
Alkaline Phosphatase: 70 U/L (ref 38–126)
Anion gap: 9 (ref 5–15)
BUN: 21 mg/dL (ref 8–23)
CO2: 20 mmol/L — ABNORMAL LOW (ref 22–32)
Calcium: 8.1 mg/dL — ABNORMAL LOW (ref 8.9–10.3)
Chloride: 109 mmol/L (ref 98–111)
Creatinine, Ser: 1.39 mg/dL — ABNORMAL HIGH (ref 0.61–1.24)
GFR calc Af Amer: 54 mL/min — ABNORMAL LOW (ref >60)
GFR calc non Af Amer: 47 mL/min — ABNORMAL LOW (ref >60)
GLUCOSE: 174 mg/dL — AB (ref 70–99)
Potassium: 3 mmol/L — ABNORMAL LOW (ref 3.5–5.1)
Sodium: 138 mmol/L (ref 135–145)
Total Bilirubin: 1.2 mg/dL (ref 0.3–1.2)
Total Protein: 5.8 g/dL — ABNORMAL LOW (ref 6.5–8.1)

## 2018-04-18 LAB — TYPE AND SCREEN
ABO/RH(D): O POS
Antibody Screen: POSITIVE
Donor AG Type: NEGATIVE
Unit division: 0

## 2018-04-18 LAB — BPAM RBC
Blood Product Expiration Date: 202002092359
ISSUE DATE / TIME: 202001241318
Unit Type and Rh: 9500

## 2018-04-18 LAB — URINE CULTURE: Culture: NO GROWTH

## 2018-04-18 LAB — CBG MONITORING, ED: GLUCOSE-CAPILLARY: 165 mg/dL — AB (ref 70–99)

## 2018-04-18 LAB — LACTIC ACID, PLASMA
Lactic Acid, Venous: 1.1 mmol/L (ref 0.5–1.9)
Lactic Acid, Venous: 0.8 mmol/L (ref 0.5–1.9)

## 2018-04-18 MED ORDER — ASPIRIN 300 MG RE SUPP: 300.0000 mg | Freq: Once | RECTAL | Status: DC

## 2018-04-18 MED ORDER — MAGNESIUM SULFATE 2 GM/50ML IV SOLN
2.0000 g | Freq: Once | INTRAVENOUS | Status: AC
  Administered 2018-04-18: 2 g via INTRAVENOUS
  Filled 2018-04-18: qty 50

## 2018-04-18 MED ORDER — ONDANSETRON HCL 4 MG/2ML IJ SOLN: 4.0000 mg | Freq: Four times a day (QID) | INTRAMUSCULAR | Status: DC | PRN

## 2018-04-18 MED ORDER — BISACODYL 10 MG RE SUPP: 10.0000 mg | Freq: Every day | RECTAL | Status: DC | PRN

## 2018-04-18 MED ORDER — POTASSIUM CHLORIDE IN NACL 40-0.9 MEQ/L-% IV SOLN
INTRAVENOUS | Status: DC
  Administered 2018-04-18: 50 mL/h via INTRAVENOUS
  Filled 2018-04-18 (×2): qty 1000

## 2018-04-18 MED ORDER — ASPIRIN 325 MG PO TABS
325.0000 mg | ORAL_TABLET | Freq: Once | ORAL | Status: AC
  Administered 2018-04-18: 325 mg via ORAL

## 2018-04-18 MED ORDER — VANCOMYCIN HCL IN DEXTROSE 1-5 GM/200ML-% IV SOLN: 1000.0000 mg | Freq: Once | INTRAVENOUS | Status: DC

## 2018-04-18 MED ORDER — SODIUM CHLORIDE 0.9 % IV SOLN
2.0000 g | INTRAVENOUS | Status: DC
  Administered 2018-04-18: 2 g via INTRAVENOUS
  Filled 2018-04-18 (×2): qty 2

## 2018-04-18 MED ORDER — SODIUM CHLORIDE 0.9 % IV SOLN: 2.0000 g | Freq: Two times a day (BID) | INTRAVENOUS | Status: DC

## 2018-04-18 MED ORDER — SODIUM CHLORIDE 0.9% FLUSH
3.0000 mL | Freq: Once | INTRAVENOUS | Status: AC
  Administered 2018-04-18: 3 mL via INTRAVENOUS

## 2018-04-18 MED ORDER — ACETAMINOPHEN 650 MG RE SUPP
975.0000 mg | Freq: Once | RECTAL | Status: AC
  Administered 2018-04-18: 975 mg via RECTAL
  Filled 2018-04-18: qty 1

## 2018-04-18 MED ORDER — POTASSIUM CHLORIDE 10 MEQ/100ML IV SOLN
10.0000 meq | Freq: Once | INTRAVENOUS | Status: AC
  Administered 2018-04-18: 10 meq via INTRAVENOUS
  Filled 2018-04-18: qty 100

## 2018-04-18 MED ORDER — POTASSIUM CHLORIDE IN NACL 40-0.9 MEQ/L-% IV SOLN
INTRAVENOUS | Status: DC
  Filled 2018-04-18 (×2): qty 1000

## 2018-04-18 MED ORDER — ASPIRIN 325 MG PO TABS
ORAL_TABLET | ORAL | Status: AC
  Filled 2018-04-18: qty 1

## 2018-04-18 MED ORDER — VANCOMYCIN HCL 10 G IV SOLR
1500.0000 mg | Freq: Once | INTRAVENOUS | Status: AC
  Administered 2018-04-18: 1500 mg via INTRAVENOUS
  Filled 2018-04-18: qty 1500

## 2018-04-18 MED ORDER — INSULIN ASPART 100 UNIT/ML ~~LOC~~ SOLN
0.0000 [IU] | Freq: Four times a day (QID) | SUBCUTANEOUS | Status: DC
  Administered 2018-04-18: 2 [IU] via SUBCUTANEOUS
  Administered 2018-04-19: 1 [IU] via SUBCUTANEOUS
  Administered 2018-04-19: 2 [IU] via SUBCUTANEOUS
  Administered 2018-04-19: 1 [IU] via SUBCUTANEOUS
  Administered 2018-04-20: 2 [IU] via SUBCUTANEOUS
  Administered 2018-04-21: 1 [IU] via SUBCUTANEOUS
  Filled 2018-04-18: qty 1

## 2018-04-18 MED ORDER — METRONIDAZOLE IN NACL 5-0.79 MG/ML-% IV SOLN
500.0000 mg | Freq: Three times a day (TID) | INTRAVENOUS | Status: DC
  Administered 2018-04-18 – 2018-04-19 (×3): 500 mg via INTRAVENOUS
  Filled 2018-04-18 (×3): qty 100

## 2018-04-18 MED ORDER — SODIUM CHLORIDE 0.9 % IV SOLN
1000.0000 mL | INTRAVENOUS | Status: DC
  Administered 2018-04-18: 1000 mL via INTRAVENOUS

## 2018-04-18 MED ORDER — ONDANSETRON HCL 4 MG PO TABS: 4.0000 mg | ORAL_TABLET | Freq: Four times a day (QID) | ORAL | Status: DC | PRN

## 2018-04-18 MED ORDER — SODIUM CHLORIDE 0.9 % IV SOLN
2.0000 g | Freq: Once | INTRAVENOUS | Status: AC
  Administered 2018-04-18: 2 g via INTRAVENOUS
  Filled 2018-04-18: qty 2

## 2018-04-18 MED ORDER — VANCOMYCIN HCL IN DEXTROSE 1-5 GM/200ML-% IV SOLN
1000.0000 mg | INTRAVENOUS | Status: DC
  Administered 2018-04-19: 1000 mg via INTRAVENOUS
  Filled 2018-04-18: qty 200

## 2018-04-18 NOTE — ED Triage Notes (Signed)
Pt brought to ED via [facility]. Pt with AMS since 0800. Pt discharged yesterday after receiving a unit of PRBC. Pt with fever at home. Pt received O2 and liter of saline. Pt with left sided neglect per EMS. Pt will not use left arm.

## 2018-04-18 NOTE — ED Provider Notes (Signed)
[facility] EMERGENCY DEPARTMENT Provider Note   CSN: [id number] Arrival date & time: 04/18/18  1208     History   Chief Complaint Chief Complaint  Patient presents with  . Altered Mental Status    HPI [name] is a 85 y.o. male.  5 caveat dementia.  History is obtained from paramedics, from hospital records and from patient's daughter and caregiver who accompany him  HPI Patient was brought home from hospital this morning at approximately 12 midnight.  He was noted to be cold and shivering per his daughter.  This morning he was less responsive and noted to have facial flushing.  His caregiver reports that he has had "sinus congestion" over past 2 weeks.  paraMedics noted him to be hot to the touch they treated him with supplemental oxygen. Past Medical History:  Diagnosis Date  . Anemia    Iron deficiency  . Aortic stenosis    s/p pericardial AVR  . Aortic valve disorder 06/23/2008   Qualifier: Diagnosis of  By: Mare Ferrari, RMA, Sherri    . Arthritis   . Benign prostatic hypertrophy   . CAD (coronary artery disease) 01/30/2011  . Carotid bruit 08/21/2016  . COLONIC POLYPS 06/23/2008   Qualifier: Diagnosis of  By: Mare Ferrari, RMA, Sherri    . Coronary artery disease    s/p CABG 08/2010    . Diabetes mellitus   . Dyslipidemia 10/15/2017  . Essential hypertension 08/21/2016  . H/O aortic valve replacement 08/21/2016  . Hyperlipidemia   . Hyperlipidemia LDL goal <70 06/23/2008   Qualifier: Diagnosis of  By: Mare Ferrari, RMA, Sherri    . Leg swelling 10/15/2017  . Orthostatic hypotension 06/22/2012  . Palpitations 08/21/2016  . Precordial pain 09/18/2011  . Type 2 diabetes mellitus with hyperlipidemia ([place]) 06/23/2008   Qualifier: Diagnosis of  By: Mare Ferrari, RMA, Sherri    . Unstable angina ([place]) 07/24/2011    Patient Active Problem List   Diagnosis Date Noted  . Fever 04/16/2018  . Vomiting 04/16/2018  . Type 2 diabetes mellitus ([place]) 04/16/2018  . Cerebrovascular accident (CVA) ([place])  02/11/2018  . Dementia without behavioral disturbance ([place]) 01/27/2018  . Falls frequently 01/27/2018  . DNR (do not resuscitate) 01/27/2018  . Diabetes mellitus due to underlying condition with diabetic autonomic neuropathy, without long-term current use of insulin ([place]) 01/21/2018  . Depression 12/26/2017  . SIRS (systemic inflammatory response syndrome) ([place]) 12/26/2017  . Syncope   . Syncope and collapse   . Fall   . Microcytic anemia 12/25/2017  . Dyslipidemia 10/15/2017  . Leg swelling 10/15/2017  . Palpitations 08/21/2016  . HTN (hypertension) 08/21/2016  . Carotid bruit 08/21/2016  . H/O aortic valve replacement 08/21/2016  . Anemia, iron deficiency 04/11/2014  . Orthostatic hypotension 06/22/2012  . Dizzy 06/22/2012  . Precordial pain 09/18/2011  . Unstable angina ([place]) 07/24/2011  . CAD (coronary artery disease) 01/30/2011  . COLONIC POLYPS 06/23/2008  . Hyperlipidemia LDL goal <70 06/23/2008  . Aortic valve disorder 06/23/2008  . BENIGN PROSTATIC HYPERTROPHY, HX OF 06/23/2008    Past Surgical History:  Procedure Laterality Date  . AORTIC VALVE REPLACEMENT  08/24/10   51mm pericardial tissue valve  . COLONOSCOPY WITH PROPOFOL N/A 12/28/2017   Procedure: COLONOSCOPY WITH PROPOFOL;  Surgeon: Rogene Houston, MD;  Location: [facility] ENDO SUITE;  Service: Endoscopy;  Laterality: N/A;  . CORONARY ARTERY BYPASS GRAFT  08/24/10   LIMA to LAD, SVG to ramus intermediate, SVG to diagonal  . ESOPHAGOGASTRODUODENOSCOPY (EGD) WITH  PROPOFOL N/A 12/28/2017   Procedure: ESOPHAGOGASTRODUODENOSCOPY (EGD) WITH PROPOFOL;  Surgeon: Rogene Houston, MD;  Location: [facility] ENDO SUITE;  Service: Endoscopy;  Laterality: N/A;  . HERNIA REPAIR    . knee replacements     x 2  . POLYPECTOMY  12/28/2017   Procedure: POLYPECTOMY;  Surgeon: Rogene Houston, MD;  Location: [facility] ENDO SUITE;  Service: Endoscopy;;  transverse colon         Home Medications    Prior to Admission medications   Medication Sig  Start Date End Date Taking? Authorizing Provider  ACCU-CHEK SOFTCLIX LANCETS lancets Use as instructed 09/15/15   Chipper Herb, MD  aspirin 81 MG tablet Take 1 tablet (81 mg total) by mouth every morning. 12/25/17   Chipper Herb, MD  escitalopram (LEXAPRO) 20 MG tablet Take 20 mg by mouth daily.    [provider]  furosemide (LASIX) 20 MG tablet Take 1 tablet (20 mg total) by mouth daily. Take 40 mg on Mon, Wed, Fri and 20 mg all other days. 04/17/18   Barton Dubois, MD  glucose blood test strip CHECK BLOOD SUGAR UP TO 4 TIMES A DAY 03/31/18   Chipper Herb, MD  metFORMIN (GLUCOPHAGE) 500 MG tablet Take 1 tablet (500 mg total) by mouth 2 (two) times daily. 12/25/17   Chipper Herb, MD  metoprolol succinate (TOPROL-XL) 25 MG 24 hr tablet Take 0.5 tablets (12.5 mg total) by mouth daily before breakfast. 01/21/18   Chipper Herb, MD  nitroGLYCERIN (NITROSTAT) 0.4 MG SL tablet Place 1 tablet (0.4 mg total) under the tongue every 5 (five) minutes x 3 doses as needed for chest pain. Patient not taking: Reported on 04/02/2018 08/21/16   Minus Breeding, MD  pantoprazole (PROTONIX) 40 MG tablet TAKE (1) TABLET TWICE A DAY BEFOR A MEAL 02/24/18   Chipper Herb, MD  polyethylene glycol [facility] / Floria Raveling) packet Take 17 g by mouth daily. 02/06/18   Chipper Herb, MD  risperiDONE (RISPERDAL) 0.25 MG tablet Take 1 tablet (0.25 mg total) by mouth 3 (three) times daily. 04/02/18   Chipper Herb, MD  rosuvastatin (CRESTOR) 40 MG tablet Take 1 tablet (40 mg total) by mouth every evening. 12/25/17 04/17/18  Chipper Herb, MD    Family History Family History  Problem Relation Age of Onset  . Cancer Mother   . Diabetes Father   . Hypertension Other   . Diabetes Other     Social History Social History   Tobacco Use  . Smoking status: Former Smoker    Types: Cigars    Last attempt to quit: 09/17/1993    Years since quitting: 24.6  . Smokeless tobacco: Never Used  Substance Use Topics    . Alcohol use: Yes    Comment: occassional  . Drug use: No   DNR CODE STATUS.  Lives at home with daughter  Allergies   Patient has no known allergies.   Review of Systems Review of Systems  Unable to perform ROS: Dementia  HENT: Positive for congestion.   Allergic/Immunologic: Positive for immunocompromised state.       Diabetic     Physical Exam Updated Vital Signs BP 111/66   Pulse (!) 118   Temp (!) 102.8 F (39.3 C) (Rectal)   Resp (!) 32   Wt 77.5 kg   SpO2 97%   BMI 26.76 kg/m   Physical Exam Vitals signs and nursing note reviewed.  Constitutional:  Appearance: He is well-developed. He is ill-appearing.     Comments: Arousable with gentle tactile stimulus.  HENT:     Head: Normocephalic and atraumatic.  Eyes:     Conjunctiva/sclera: Conjunctivae normal.     Pupils: Pupils are equal, round, and reactive to light.  Neck:     Musculoskeletal: Neck supple.     Thyroid: No thyromegaly.     Trachea: No tracheal deviation.  Cardiovascular:     Rate and Rhythm: Normal rate and regular rhythm.     Heart sounds: No murmur.  Pulmonary:     Effort: Pulmonary effort is normal.     Breath sounds: Rales present.     Comments: Rales at right base Abdominal:     General: Bowel sounds are normal. There is no distension.     Palpations: Abdomen is soft.     Tenderness: There is no abdominal tenderness.  Musculoskeletal: Normal range of motion.        General: No tenderness.  Skin:    General: Skin is warm and dry.     Findings: No rash.  Neurological:     Coordination: Coordination normal.     Comments: Arousable to gentle tactile stimulus.  Grip strength in both arms is 5/5.  He does not move his legs to command.  DTRs symmetric bilaterally at knee jerk ankle jerk and biceps toes neither up nor downgoing bilaterally  Psychiatric:     Comments: Unobtainable, dementia      ED Treatments / Results  Labs (all labs ordered are listed, but only abnormal  results are displayed) Labs Reviewed  CBC WITH DIFFERENTIAL/PLATELET - Abnormal; Notable for the following components:      Result Value   RBC 3.39 (*)    Hemoglobin 9.6 (*)    HCT 29.9 (*)    Platelets 149 (*)    Lymphs Abs 0.6 (*)    All other components within normal limits  CULTURE, BLOOD (ROUTINE X 2)  CULTURE, BLOOD (ROUTINE X 2)  LACTIC ACID, PLASMA  PROTIME-INR  COMPREHENSIVE METABOLIC PANEL  LACTIC ACID, PLASMA  URINALYSIS, ROUTINE W REFLEX MICROSCOPIC    EKG EKG Interpretation  Date/Time:  Saturday April 18 2018 12:18:51 EST Ventricular Rate:  121 PR Interval:    QRS Duration: 119 QT Interval:  353 QTC Calculation: 501 R Axis:   103 Text Interpretation:  Sinus tachycardia Nonspecific intraventricular conduction delay Low voltage, precordial leads Lateral infarct, age indeterminate No significant change since last tracing Confirmed by [employer], Inocente Salles [phone]) on 04/18/2018 1:12:48 PM   Radiology Dg Chest 2 View  Result Date: 04/16/2018 CLINICAL DATA:  Possible sepsis EXAM: CHEST - 2 VIEW COMPARISON:  01/26/2018 FINDINGS: Post sternotomy changes. Mild cardiomegaly with aortic atherosclerosis. Linear scarring or atelectasis at the left base. Degenerative changes of the spine. No pneumothorax. IMPRESSION: Scarring at the left lung base.  Mild cardiomegaly. Electronically Signed   By: Donavan Foil M.D.   On: 04/16/2018 19:31    Procedures Procedures (including critical care time)  Medications Ordered in ED Medications  sodium chloride flush (NS) 0.9 % injection 3 mL (has no administration in time range)  0.9 %  sodium chloride infusion (has no administration in time range)  ceFEPIme (MAXIPIME) 2 g in sodium chloride 0.9 % 100 mL IVPB (has no administration in time range)  metroNIDAZOLE (FLAGYL) IVPB 500 mg (has no administration in time range)  vancomycin (VANCOCIN) IVPB 1000 mg/200 mL premix (has no administration in time range)   Results  for orders placed or  performed during the hospital encounter of 04/18/18  Culture, blood (Routine x 2)  Result Value Ref Range   Specimen Description      BLOOD RIGHT ARM BOTTLES DRAWN AEROBIC AND ANAEROBIC   Special Requests      Blood Culture adequate volume Performed at [facility], [address] [place], [place] [address]    Culture PENDING    Report Status PENDING   Culture, blood (Routine x 2)  Result Value Ref Range   Specimen Description      BLOOD LEFT HAND BOTTLES DRAWN AEROBIC AND ANAEROBIC   Special Requests      Blood Culture adequate volume Performed at [facility], [address], [place], [place] [address]    Culture PENDING    Report Status PENDING   Comprehensive metabolic panel  Result Value Ref Range   Sodium 138 135 - 145 mmol/L   Potassium 3.0 (L) 3.5 - 5.1 mmol/L   Chloride 109 98 - 111 mmol/L   CO2 20 (L) 22 - 32 mmol/L   Glucose, Bld 174 (H) 70 - 99 mg/dL   BUN 21 8 - 23 mg/dL   Creatinine, Ser 1.39 (H) 0.61 - 1.24 mg/dL   Calcium 8.1 (L) 8.9 - 10.3 mg/dL   Total Protein 5.8 (L) 6.5 - 8.1 g/dL   Albumin 2.7 (L) 3.5 - 5.0 g/dL   AST 38 15 - 41 U/L   ALT 20 0 - 44 U/L   Alkaline Phosphatase 70 38 - 126 U/L   Total Bilirubin 1.2 0.3 - 1.2 mg/dL   GFR calc non Af Amer 47 (L) >60 mL/min   GFR calc Af Amer 54 (L) >60 mL/min   Anion gap 9 5 - 15  Lactic acid, plasma  Result Value Ref Range   Lactic Acid, Venous 1.1 0.5 - 1.9 mmol/L  CBC with Differential  Result Value Ref Range   WBC 8.3 4.0 - 10.5 K/uL   RBC 3.39 (L) 4.22 - 5.81 MIL/uL   Hemoglobin 9.6 (L) 13.0 - 17.0 g/dL   HCT 29.9 (L) 39.0 - 52.0 %   MCV 88.2 80.0 - 100.0 fL   MCH 28.3 26.0 - 34.0 pg   MCHC 32.1 30.0 - 36.0 g/dL   RDW 15.2 11.5 - 15.5 %   Platelets 149 (L) 150 - 400 K/uL   nRBC 0.0 0.0 - 0.2 %   Neutrophils Relative % 85 %   Neutro Abs 7.0 1.7 - 7.7 K/uL   Lymphocytes Relative 7 %   Lymphs Abs 0.6 (L) 0.7 - 4.0 K/uL   Monocytes Relative 7 %   Monocytes Absolute 0.6 0.1 - 1.0 K/uL    Eosinophils Relative 0 %   Eosinophils Absolute 0.0 0.0 - 0.5 K/uL   Basophils Relative 0 %   Basophils Absolute 0.0 0.0 - 0.1 K/uL   Immature Granulocytes 1 %   Abs Immature Granulocytes 0.04 0.00 - 0.07 K/uL  Protime-INR  Result Value Ref Range   Prothrombin Time 14.5 11.4 - 15.2 seconds   INR 1.14   Urinalysis, Routine w reflex microscopic  Result Value Ref Range   Color, Urine YELLOW YELLOW   APPearance CLOUDY (A) CLEAR   Specific Gravity, Urine 1.017 1.005 - 1.030   pH 5.0 5.0 - 8.0   Glucose, UA NEGATIVE NEGATIVE mg/dL   Hgb urine dipstick LARGE (A) NEGATIVE   Bilirubin Urine NEGATIVE NEGATIVE   Ketones, ur 20 (A) NEGATIVE mg/dL   Protein,  ur 30 (A) NEGATIVE mg/dL   Nitrite NEGATIVE NEGATIVE   Leukocytes, UA NEGATIVE NEGATIVE   WBC, UA 0-5 0 - 5 WBC/hpf   Bacteria, UA RARE (A) NONE SEEN   Squamous Epithelial / LPF 0-5 0 - 5   Mucus PRESENT    Dg Chest 2 View  Result Date: 04/16/2018 CLINICAL DATA:  Possible sepsis EXAM: CHEST - 2 VIEW COMPARISON:  01/26/2018 FINDINGS: Post sternotomy changes. Mild cardiomegaly with aortic atherosclerosis. Linear scarring or atelectasis at the left base. Degenerative changes of the spine. No pneumothorax. IMPRESSION: Scarring at the left lung base.  Mild cardiomegaly. Electronically Signed   By: Donavan Foil M.D.   On: 04/16/2018 19:31   Dg Chest Port 1 View  Result Date: 04/18/2018 CLINICAL DATA:  Rales at right lung base. EXAM: PORTABLE CHEST 1 VIEW COMPARISON:  04/16/2018 FINDINGS: Mild cardiac enlargement and aortic atherosclerosis. There is diffuse pulmonary vascular congestion there is asymmetric opacification of the left midlung and left base. IMPRESSION: 1. Pulmonary vascular congestion with asymmetric increased opacification of the left midlung and left base. Electronically Signed   By: Kerby Moors M.D.   On: 04/18/2018 13:18    Initial Impression / Assessment and Plan / ED Course  I have reviewed the triage vital signs and the  nursing notes.  Pertinent labs & imaging results that were available during my care of the patient were reviewed by me and considered in my medical decision making (see chart for details).     Code sepsis called based on sirs criteria of temperature, respiratory rate source of infection unclear.  240 p.m. patient is arousable to gentle tactile stimulus after treatment with normal saline and intravenous antibiotics.  He moves all extremities follow simple commands.  Sepsis - Repeat Assessment  Performed at:    240 pm  Vitals     Blood pressure 97/66, pulse (!) 103, temperature 97.9 F (36.6 C), temperature source Rectal, resp. rate 18, weight 77.5 kg, SpO2 97 %.  Heart:     Regular rate and rhythm  Lungs:    CTA  Capillary Refill:   <2 sec  Peripheral Pulse:   Radial pulse palpable  Skin:     Normal Color   Lab work is remarkable for  hypokalemia, mild renal insufficiency, anemia.  Anemia is improved over yesterday.  Yesterday's lab work showed normal renal function. Intravenous potassium supplementation ordered Dr.Emoekpae from hospitalist service consulted by me and will arrange for overnight stay Final Clinical Impressions(s) / ED Diagnoses  Dx#1 undifferentiated sepsis #2 renal insufficiency #3 anemia Final diagnoses:  None  #4 hypokalemia  ED Discharge Orders    None    CRITICAL CARE Performed by: Orlie Dakin Total critical care time: 35 minutes Critical care time was exclusive of separately billable procedures and treating other patients. Critical care was necessary to treat or prevent imminent or life-threatening deterioration. Critical care was time spent personally by me on the following activities: development of treatment plan with patient and/or surrogate as well as nursing, discussions with consultants, evaluation of patient's response to treatment, examination of patient, obtaining history from patient or surrogate, ordering and performing treatments and  interventions, ordering and review of laboratory studies, ordering and review of radiographic studies, pulse oximetry and re-evaluation of patient's condition.   Orlie Dakin, MD 04/18/18 [phone]

## 2018-04-18 NOTE — ED Notes (Signed)
Report given to [facility] at this time.   

## 2018-04-18 NOTE — H&P (Addendum)
History and Physical    [name] DVV:[id number] DOB: Mar 12, 1933 DOA: 04/18/2018  PCP: Chipper Herb, MD   Patient coming from: Home  I have personally briefly reviewed patient's old medical records in [place]  Chief Complaint: Confusion, fevers  HPI: [name] is a 85 y.o. male with medical history significant for HTN, AV replacement, BPH, CAD, DM2, CVA, who was brought to the ED with complaints of confusion and fevers.  Patient was discharged from the hospital yesterday- 1/23 - 1/24, admitted for similar complaints of fevers, ? confusion as the patient has baseline dementia.  Patient had had a transfusion the day before admission 1/22, fevers were attributed to transfusion reaction as focus of infection was not identified.  Blood cultures 1 out of 2 growing coagulase-negative staph-thought to be contaminant.  He was also given 1 unit PRBC for anemia. Family reports patient initially felt better but subsequently started having fevers and chills late last night, today he was not responding verbally as he previously was.  Family reports difficulty breathing 2 days ago, but not today, no cough.  Patient denies headaches to me, no neck stiffness or pain.  Vomited 2 days ago but none since.  No diarrhea.  No ulcers.  He has 2 small "hive- appearing rash" on his bilateral lower extremities, which family reports is new. Initial reports of patient not moving his bedside but this is not present at the time of ED provider or my evaluation.  ED Course: Febrile 102.8.  Tachycardic to 121.  Tachypnea to 34.  Hypotensive to 92/61.  O2 sats greater than 93% on room air.  UA-rare bacteria, large hemoglobin.  Normal lactic acid-1.1.  WBC 8.3.  Hemoglobin stable 9.6.  K low at 8.  Portable chest x-ray pulmonary vascular congestion asymmetric increased opacification in the left midlung and left base.  Blood cultures obtained.  Started on broad spectrum antibiotics Vanc, cefepime and  metronidazole.  Review of Systems: As per HPI all other systems reviewed and negative.   Past Medical History:  Diagnosis Date  . Anemia    Iron deficiency  . Aortic stenosis    s/p pericardial AVR  . Aortic valve disorder 06/23/2008   Qualifier: Diagnosis of  By: Mare Ferrari, RMA, Sherri    . Arthritis   . Benign prostatic hypertrophy   . CAD (coronary artery disease) 01/30/2011  . Carotid bruit 08/21/2016  . COLONIC POLYPS 06/23/2008   Qualifier: Diagnosis of  By: Mare Ferrari, RMA, Sherri    . Coronary artery disease    s/p CABG 08/2010    . Diabetes mellitus   . Dyslipidemia 10/15/2017  . Essential hypertension 08/21/2016  . H/O aortic valve replacement 08/21/2016  . Hyperlipidemia   . Hyperlipidemia LDL goal <70 06/23/2008   Qualifier: Diagnosis of  By: Mare Ferrari, RMA, Sherri    . Leg swelling 10/15/2017  . Orthostatic hypotension 06/22/2012  . Palpitations 08/21/2016  . Precordial pain 09/18/2011  . Type 2 diabetes mellitus with hyperlipidemia ([facility]) 06/23/2008   Qualifier: Diagnosis of  By: Mare Ferrari, RMA, Sherri    . Unstable angina ([place]) 07/24/2011    Past Surgical History:  Procedure Laterality Date  . AORTIC VALVE REPLACEMENT  08/24/10   75mm pericardial tissue valve  . COLONOSCOPY WITH PROPOFOL N/A 12/28/2017   Procedure: COLONOSCOPY WITH PROPOFOL;  Surgeon: Rogene Houston, MD;  Location[facility];  Service: Endoscopy;  Laterality: N/A;  . CORONARY ARTERY BYPASS GRAFT  08/24/10   LIMA to LAD,  SVG to ramus intermediate, SVG to diagonal  . ESOPHAGOGASTRODUODENOSCOPY (EGD) WITH PROPOFOL N/A 12/28/2017   Procedure: ESOPHAGOGASTRODUODENOSCOPY (EGD) WITH PROPOFOL;  Surgeon: Rogene Houston, MD;  Location: [facility];  Service: Endoscopy;  Laterality: N/A;  . HERNIA REPAIR    . knee replacements     x 2  . POLYPECTOMY  12/28/2017   Procedure: POLYPECTOMY;  Surgeon: Rogene Houston, MD;  Location: [facility];  Service: Endoscopy;;  transverse colon     reports that he quit smoking  about 24 years ago. His smoking use included cigars. He has never used smokeless tobacco. He reports current alcohol use. He reports that he does not use drugs.  No Known Allergies  Family History  Problem Relation Age of Onset  . Cancer Mother   . Diabetes Father   . Hypertension Other   . Diabetes Other     Prior to Admission medications   Medication Sig Start Date End Date Taking? Authorizing Provider  ACCU-CHEK SOFTCLIX LANCETS lancets Use as instructed 09/15/15  Yes Chipper Herb, MD  aspirin 81 MG tablet Take 1 tablet (81 mg total) by mouth every morning. 12/25/17  Yes Chipper Herb, MD  escitalopram (LEXAPRO) 20 MG tablet Take 20 mg by mouth daily.   Yes [provider]  furosemide (LASIX) 20 MG tablet Take 1 tablet (20 mg total) by mouth daily. Take 40 mg on Mon, Wed, Fri and 20 mg all other days. 04/17/18  Yes Barton Dubois, MD  glucose blood test strip CHECK BLOOD SUGAR UP TO 4 TIMES A DAY 03/31/18  Yes Chipper Herb, MD  metFORMIN (GLUCOPHAGE) 500 MG tablet Take 1 tablet (500 mg total) by mouth 2 (two) times daily. 12/25/17  Yes Chipper Herb, MD  metoprolol succinate (TOPROL-XL) 25 MG 24 hr tablet Take 0.5 tablets (12.5 mg total) by mouth daily before breakfast. 01/21/18  Yes Chipper Herb, MD  nitroGLYCERIN (NITROSTAT) 0.4 MG SL tablet Place 1 tablet (0.4 mg total) under the tongue every 5 (five) minutes x 3 doses as needed for chest pain. 08/21/16  Yes Minus Breeding, MD  pantoprazole (PROTONIX) 40 MG tablet TAKE (1) TABLET TWICE A DAY BEFOR A MEAL 02/24/18  Yes Chipper Herb, MD  polyethylene glycol [facility] / Floria Raveling) packet Take 17 g by mouth daily. 02/06/18  Yes Chipper Herb, MD  risperiDONE (RISPERDAL) 0.25 MG tablet Take 1 tablet (0.25 mg total) by mouth 3 (three) times daily. 04/02/18  Yes Chipper Herb, MD  rosuvastatin (CRESTOR) 40 MG tablet Take 1 tablet (40 mg total) by mouth every evening. 12/25/17 04/17/18  Chipper Herb, MD    Physical  Exam: Vitals:   04/18/18 1400 04/18/18 1415 04/18/18 1430 04/18/18 1437  BP: 92/61  97/66   Pulse: (!) 103 (!) 101 (!) 103   Resp: (!) 23 (!) 24 18   Temp:    97.9 F (36.6 C)  TempSrc:    Rectal  SpO2: 97% 97% 97%   Weight:        Constitutional: Sleeping but arousable, lethargic Vitals:   04/18/18 1400 04/18/18 1415 04/18/18 1430 04/18/18 1437  BP: 92/61  97/66   Pulse: (!) 103 (!) 101 (!) 103   Resp: (!) 23 (!) 24 18   Temp:    97.9 F (36.6 C)  TempSrc:    Rectal  SpO2: 97% 97% 97%   Weight:       Eyes: PERRL, lids and conjunctivae normal  ENMT: Mucous membranes are moist.  Neck: normal, supple, no masses, no thyromegaly Respiratory: clear to auscultation bilaterally-anterior auscultation, no wheezing, no crackles. Normal respiratory effort. No accessory muscle use.  Cardiovascular: Tachycardic, regular rate and rhythm, no murmurs / rubs / gallops. No extremity edema. 2+ pedal pulses.  Abdomen: no tenderness, no masses palpated. No hepatosplenomegaly. Bowel sounds positive.  Musculoskeletal: no clubbing / cyanosis. No joint deformity upper and lower extremities. Good ROM, no contractures. Normal muscle tone.  Skin: ~4 by 2cm erythematous plaques anterior medial surface mid leg, looks like hives, ulcers. No induration Neurologic: Full neuro exam limited by patient's mental status, neck supple.  Following most directions.  Moving all extremities.  No obvious focal cranial nerve deficits. Psychiatric: Normal judgment and insight. Alert and oriented x 3. Normal mood.   Labs on Admission: I have personally reviewed following labs and imaging studies  CBC: Recent Labs  Lab 04/16/18 1837 04/17/18 0553 04/18/18 1219  WBC 11.5* 8.2 8.3  NEUTROABS 9.6*  --  7.0  HGB 9.3* 7.5* 9.6*  HCT 28.9* 23.7* 29.9*  MCV 90.3 90.8 88.2  PLT 190 146* 016*   Basic Metabolic Panel: Recent Labs  Lab 04/16/18 1837 04/17/18 0553 04/18/18 1219  NA 133* 138 138  K 3.7 3.4* 3.0*  CL 100  109 109  CO2 24 23 20*  GLUCOSE 200* 118* 174*  BUN 21 18 21   CREATININE 1.14 0.99 1.39*  CALCIUM 8.9 8.0* 8.1*   Liver Function Tests: Recent Labs  Lab 04/16/18 1837 04/18/18 1219  AST 26 38  ALT 20 20  ALKPHOS 80 70  BILITOT 0.9 1.2  PROT 6.5 5.8*  ALBUMIN 3.3* 2.7*   Coagulation Profile: Recent Labs  Lab 04/18/18 1219  INR 1.14   CBG: Recent Labs  Lab 04/17/18 0809 04/17/18 1045 04/17/18 1510  GLUCAP 94 151* 123*   Urine analysis:    Component Value Date/Time   COLORURINE YELLOW 04/18/2018 1316   APPEARANCEUR CLOUDY (A) 04/18/2018 1316   APPEARANCEUR Clear 07/24/2016 1110   LABSPEC 1.017 04/18/2018 1316   PHURINE 5.0 04/18/2018 1316   GLUCOSEU NEGATIVE 04/18/2018 1316   HGBUR LARGE (A) 04/18/2018 1316   BILIRUBINUR NEGATIVE 04/18/2018 1316   BILIRUBINUR Negative 07/24/2016 1110   KETONESUR 20 (A) 04/18/2018 1316   PROTEINUR 30 (A) 04/18/2018 1316   UROBILINOGEN negative 08/23/2014 1030   UROBILINOGEN 0.2 08/21/2010 1150   NITRITE NEGATIVE 04/18/2018 1316   LEUKOCYTESUR NEGATIVE 04/18/2018 1316   LEUKOCYTESUR Negative 07/24/2016 1110    Radiological Exams on Admission: Dg Chest 2 View  Result Date: 04/16/2018 CLINICAL DATA:  Possible sepsis EXAM: CHEST - 2 VIEW COMPARISON:  01/26/2018 FINDINGS: Post sternotomy changes. Mild cardiomegaly with aortic atherosclerosis. Linear scarring or atelectasis at the left base. Degenerative changes of the spine. No pneumothorax. IMPRESSION: Scarring at the left lung base.  Mild cardiomegaly. Electronically Signed   By: Donavan Foil M.D.   On: 04/16/2018 19:31   Dg Chest Port 1 View  Result Date: 04/18/2018 CLINICAL DATA:  Rales at right lung base. EXAM: PORTABLE CHEST 1 VIEW COMPARISON:  04/16/2018 FINDINGS: Mild cardiac enlargement and aortic atherosclerosis. There is diffuse pulmonary vascular congestion there is asymmetric opacification of the left midlung and left base. IMPRESSION: 1. Pulmonary vascular congestion  with asymmetric increased opacification of the left midlung and left base. Electronically Signed   By: Kerby Moors M.D.   On: 04/18/2018 13:18    EKG: T wave changes lateral leads V5 V6, also looks  clean.  No specific CVA changes 1 aVL and lead III.  Assessment/Plan Active Problems:   AMS (altered mental status)   Sepsis ([place])   Metabolic encephalopathy-patient meeting SIRS criteria, without lactic acidosis.  WBC 8.3.  Possible pneumonia, but symptoms of dyspnea was 2 days ago, no cough.  Chest x-ray shows vascular congestion and increased opacity left midlung and base.  Recent hospital admission for same, thought to be transfusion related. 1/2 coag negative staph-thought contaminants.  Neck supple, without headaches.  No GI symptoms.  UA not suggestive of infectious etiology. - Head CT Stat -Continue broad-spectrum antibiotics Vanco and cefepime, and metronidazole -Follow-up blood cultures -Obtain urine cultures -Cautious IV fluid use N/s + 20KCL 50cc/hr X 10hrs. -Influenza panel - NPO till improvement in mental status  Elevated troponin - 4.48 > 4.0, troponin ordered for EKG changed, T wave inversions lateral leads V5 V6.  Also ST/T wave abnormalities 1 aVL. Denies chest pain, but he is altered and answering No to all questions with baseline dementia.Marland Kitchen CAD hx, CABG 2012. - talked to Cards on call- fellow, Dr. Theresa Duty. Agrees with transfer to [facility], cards to see on arrival. Heparin gtt deferred, hx of iron deficency anemia, recent hgb 7, requiring 1u PRBC. No BB with hypotensive blood pressure. - Aspirin 325mg  x 1 given - Trend troponin  Hypokalemia- K- 3.  Magnesium 1.6 -Replete both - BMP, mag a.m  Aortic valve replacement-tissue valave.  Follows with Dr. Alba Cory.  If positive cultures, valves will need evaluation.  Iron deficiency anemia-  hemoglobin 9.6 s/p 1 unit PRBC recent admission, and iron transfusion. Follows with Dr. Delton Coombes - Cbc a.m  DM2-glucose 174. HgbA1c  01/21/18-6.5 - SSI -Hold metformin  Hypertension-hypotensive blood pressure systolic 14/78 -Hydrate cautiously -Hold home metoprolol  CAD status post CABG 2012 - Per above, F/w Dr. Percival Spanish.  Dementia, depression,   - Hold home Lexapro, risperidone while n.p.o.   DVT prophylaxis: SCDS Code Status: DNR- confirmed with family at bedside, but family want pressors if needed Family Communication: Care-giver and 2 daughters at bedside, daughter [name] is HCPOA Disposition Plan:  Per rounding team Consults called: None Admission status: Inpt, Step down   Bethena Roys MD [facility]  04/18/2018, 4:26 PM

## 2018-04-18 NOTE — ED Notes (Signed)
Glucose 192 by EMS

## 2018-04-18 NOTE — ED Notes (Signed)
Pt opened eyes and ask staff what we where doing.  Raised both legs up and held them in the air when ask to by edp

## 2018-04-18 NOTE — Progress Notes (Signed)
Pharmacy Antibiotic Note  [name] is a 85 y.o. male admitted on 04/18/2018 with sepsis.  Pharmacy has been consulted for vancomycin and cefepime dosing.  Plan: Vancomycin 1000mg  IV every 24h hours.  Goal trough 15-20 mcg/mL. cefepime 2gm iv q12h   Weight: 170 lb 13.7 oz (77.5 kg)  Temp (24hrs), Avg:99 F (37.2 C), Min:97.7 F (36.5 C), Max:102.8 F (39.3 C)  Recent Labs  Lab 04/16/18 1837 04/16/18 1951 04/17/18 0553 04/18/18 1219 04/18/18 1221  WBC 11.5*  --  8.2 8.3  --   CREATININE 1.14  --  0.99 1.39*  --   LATICACIDVEN 1.3 0.9  --   --  1.1    Estimated Creatinine Clearance: 37.6 mL/min (A) (by C-G formula based on SCr of 1.39 mg/dL (H)).    No Known Allergies  Antimicrobials this admission: 1/25 cefepime >>  1/25 vancomycin >>  1/25 metronidazole >>  Microbiology results: 1/25 BCx: sent    Thank you for allowing pharmacy to be a part of this patient's care.  Donna Christen Correen Bubolz 04/18/2018 12:52 PM

## 2018-04-18 NOTE — ED Notes (Signed)
CRITICAL VALUE ALERT  Critical Value:  Troponin 4.48  Date & Time Notied:  1749 04/18/18  Provider Notified:  EEE  Orders Received/Actions taken:

## 2018-04-18 NOTE — ED Notes (Signed)
Report given to 5W RN at this time.

## 2018-04-19 DIAGNOSIS — E782 Mixed hyperlipidemia: Secondary | ICD-10-CM

## 2018-04-19 DIAGNOSIS — I249 Acute ischemic heart disease, unspecified: Secondary | ICD-10-CM

## 2018-04-19 DIAGNOSIS — I25709 Atherosclerosis of coronary artery bypass graft(s), unspecified, with unspecified angina pectoris: Secondary | ICD-10-CM

## 2018-04-19 LAB — CBC
HCT: 27.8 % — ABNORMAL LOW (ref 39.0–52.0)
Hemoglobin: 9.2 g/dL — ABNORMAL LOW (ref 13.0–17.0)
MCH: 29.1 pg (ref 26.0–34.0)
MCHC: 33.1 g/dL (ref 30.0–36.0)
MCV: 88 fL (ref 80.0–100.0)
Platelets: 138 10*3/uL — ABNORMAL LOW (ref 150–400)
RBC: 3.16 MIL/uL — ABNORMAL LOW (ref 4.22–5.81)
RDW: 15.2 % (ref 11.5–15.5)
WBC: 8.5 10*3/uL (ref 4.0–10.5)
nRBC: 0 % (ref 0.0–0.2)

## 2018-04-19 LAB — GLUCOSE, CAPILLARY
Glucose-Capillary: 101 mg/dL — ABNORMAL HIGH (ref 70–99)
Glucose-Capillary: 106 mg/dL — ABNORMAL HIGH (ref 70–99)
Glucose-Capillary: 118 mg/dL — ABNORMAL HIGH (ref 70–99)
Glucose-Capillary: 131 mg/dL — ABNORMAL HIGH (ref 70–99)
Glucose-Capillary: 156 mg/dL — ABNORMAL HIGH (ref 70–99)
Glucose-Capillary: 158 mg/dL — ABNORMAL HIGH (ref 70–99)
Glucose-Capillary: 90 mg/dL (ref 70–99)

## 2018-04-19 LAB — CULTURE, BLOOD (ROUTINE X 2)
Special Requests: ADEQUATE
Special Requests: ADEQUATE

## 2018-04-19 LAB — RETICULOCYTES
Immature Retic Fract: 9.4 % (ref 2.3–15.9)
RBC.: 3.16 MIL/uL — ABNORMAL LOW (ref 4.22–5.81)
Retic Count, Absolute: 41.4 10*3/uL (ref 19.0–186.0)
Retic Ct Pct: 1.3 % (ref 0.4–3.1)

## 2018-04-19 LAB — COMPREHENSIVE METABOLIC PANEL
ALT: 19 U/L (ref 0–44)
AST: 42 U/L — ABNORMAL HIGH (ref 15–41)
Albumin: 2.4 g/dL — ABNORMAL LOW (ref 3.5–5.0)
Alkaline Phosphatase: 55 U/L (ref 38–126)
Anion gap: 8 (ref 5–15)
BUN: 21 mg/dL (ref 8–23)
CHLORIDE: 111 mmol/L (ref 98–111)
CO2: 21 mmol/L — ABNORMAL LOW (ref 22–32)
CREATININE: 1.23 mg/dL (ref 0.61–1.24)
Calcium: 8.1 mg/dL — ABNORMAL LOW (ref 8.9–10.3)
GFR calc Af Amer: >60 mL/min (ref >60)
GFR, EST NON AFRICAN AMERICAN: 54 mL/min — AB (ref >60)
Glucose, Bld: 109 mg/dL — ABNORMAL HIGH (ref 70–99)
Potassium: 3.3 mmol/L — ABNORMAL LOW (ref 3.5–5.1)
Sodium: 140 mmol/L (ref 135–145)
Total Bilirubin: 0.9 mg/dL (ref 0.3–1.2)
Total Protein: 5 g/dL — ABNORMAL LOW (ref 6.5–8.1)

## 2018-04-19 LAB — BRAIN NATRIURETIC PEPTIDE: B Natriuretic Peptide: 553.6 pg/mL — ABNORMAL HIGH (ref 0.0–100.0)

## 2018-04-19 LAB — IRON AND TIBC
Iron: 82 ug/dL (ref 45–182)
Saturation Ratios: 43 % — ABNORMAL HIGH (ref 17.9–39.5)
TIBC: 192 ug/dL — ABNORMAL LOW (ref 250–450)
UIBC: 110 ug/dL

## 2018-04-19 LAB — FOLATE: Folate: 19.7 ng/mL (ref >5.9)

## 2018-04-19 LAB — HEMOGLOBIN A1C
Hgb A1c MFr Bld: 5.5 % (ref 4.8–5.6)
Mean Plasma Glucose: 111.15 mg/dL

## 2018-04-19 LAB — LIPID PANEL
Cholesterol: 69 mg/dL (ref 0–200)
HDL: 16 mg/dL — ABNORMAL LOW (ref >40)
LDL Cholesterol: 39 mg/dL (ref 0–99)
Total CHOL/HDL Ratio: 4.3 RATIO
Triglycerides: 70 mg/dL (ref <150)
VLDL: 14 mg/dL (ref 0–40)

## 2018-04-19 LAB — VITAMIN B12: Vitamin B-12: 229 pg/mL (ref 180–914)

## 2018-04-19 LAB — DIRECT ANTIGLOBULIN TEST (NOT AT ARMC): DAT, complement: NEGATIVE

## 2018-04-19 LAB — MAGNESIUM: Magnesium: 2.1 mg/dL (ref 1.7–2.4)

## 2018-04-19 LAB — FERRITIN: FERRITIN: 349 ng/mL — AB (ref 24–336)

## 2018-04-19 LAB — HEPARIN LEVEL (UNFRACTIONATED): Heparin Unfractionated: <0.1 IU/mL — ABNORMAL LOW (ref 0.30–0.70)

## 2018-04-19 LAB — MRSA PCR SCREENING: MRSA by PCR: NEGATIVE

## 2018-04-19 MED ORDER — METRONIDAZOLE IN NACL 5-0.79 MG/ML-% IV SOLN: 500.0000 mg | Freq: Two times a day (BID) | INTRAVENOUS | Status: DC

## 2018-04-19 MED ORDER — SODIUM CHLORIDE 0.9 % IV SOLN
300.0000 mg | Freq: Three times a day (TID) | INTRAVENOUS | Status: DC
  Administered 2018-04-19 – 2018-04-21 (×5): 300 mg via INTRAVENOUS
  Filled 2018-04-19 (×9): qty 300

## 2018-04-19 MED ORDER — HEPARIN (PORCINE) 25000 UT/250ML-% IV SOLN
1550.0000 [IU]/h | INTRAVENOUS | Status: DC
  Administered 2018-04-19: 950 [IU]/h via INTRAVENOUS
  Administered 2018-04-20: 1350 [IU]/h via INTRAVENOUS
  Administered 2018-04-21: 1550 [IU]/h via INTRAVENOUS
  Filled 2018-04-19 (×3): qty 250

## 2018-04-19 MED ORDER — CHLORHEXIDINE GLUCONATE CLOTH 2 % EX PADS
6.0000 | MEDICATED_PAD | Freq: Every day | CUTANEOUS | Status: DC
  Administered 2018-04-19 – 2018-04-22 (×3): 6 via TOPICAL

## 2018-04-19 MED ORDER — PANTOPRAZOLE SODIUM 40 MG PO TBEC
40.0000 mg | DELAYED_RELEASE_TABLET | Freq: Every day | ORAL | Status: DC
  Administered 2018-04-19 – 2018-04-22 (×4): 40 mg via ORAL
  Filled 2018-04-19 (×6): qty 1

## 2018-04-19 MED ORDER — HEPARIN BOLUS VIA INFUSION
4000.0000 [IU] | Freq: Once | INTRAVENOUS | Status: DC
  Filled 2018-04-19: qty 4000

## 2018-04-19 MED ORDER — CEFAZOLIN SODIUM-DEXTROSE 2-4 GM/100ML-% IV SOLN
2.0000 g | Freq: Three times a day (TID) | INTRAVENOUS | Status: DC
  Administered 2018-04-19 – 2018-04-22 (×10): 2 g via INTRAVENOUS
  Filled 2018-04-19 (×13): qty 100

## 2018-04-19 MED ORDER — ROSUVASTATIN CALCIUM 20 MG PO TABS
40.0000 mg | ORAL_TABLET | Freq: Every day | ORAL | Status: DC
  Administered 2018-04-19 – 2018-04-21 (×3): 40 mg via ORAL
  Filled 2018-04-19 (×3): qty 2

## 2018-04-19 MED ORDER — METRONIDAZOLE IN NACL 5-0.79 MG/ML-% IV SOLN
500.0000 mg | Freq: Three times a day (TID) | INTRAVENOUS | Status: DC
  Administered 2018-04-19: 500 mg via INTRAVENOUS
  Filled 2018-04-19: qty 100

## 2018-04-19 NOTE — Progress Notes (Signed)
Patient admitted early this AM, seen by overnight fellow, please see full consult note. Being managed for coag neg staph bacteremia by primary team with AMS. In this setting elevated troponin to 4. Has had recurrent issues with iron deficient anemia, tranfused earlier this month. Agree with medical therapy at this time, consider cath pending clinical course from his infection and anemia. Consider TEE tomorrow for bacteremia and AVR pending his mental status, make npo at midnight     Carlyle Dolly MD

## 2018-04-19 NOTE — Progress Notes (Addendum)
Pharmacy Antibiotic Note  [name] is a 85 y.o. male admitted on 04/18/2018 with AMS/ SEPSIS.  Pharmacy has been consulted for cefazolin / rifampin dosing. Patient PMHx significant for CAD, AS s/p AVR, T2DDM, dementia, SNF resident admitted for fever, AMS in the setting of previously receiving iron infusion and blood transfusion. Patient was initially started on vancomycin/cefepime/metronidazole.   Patient now has 3/4 blood cultures (@[facility]) positive for staph epidermidis (only resistant to erythromycin) Scr improved from 1.39 to 1.23 now. WBC wnl at 8.5. Afebrile right now.   Plan: Stop vancomycin, cefepime, metronidazole - Cefazolin 2g IV every 8 hours - Rifampin 300mg  IV every 8 hours Follow-up endocarditis rule out, Abx LOT dependent upon results Monitor signs/symptoms of infection, temp, wbc, renal function.  Height: 5\' 7"  (170.2 cm) Weight: 175 lb 0.7 oz (79.4 kg) IBW/kg (Calculated) : 66.1  Temp (24hrs), Avg:98 F (36.7 C), Min:97.7 F (36.5 C), Max:98.5 F (36.9 C)  Recent Labs  Lab 04/16/18 1837 04/16/18 1951 04/17/18 0553 04/18/18 1219 04/18/18 1221 04/18/18 1551 04/19/18 0742  WBC 11.5*  --  8.2 8.3  --   --  8.5  CREATININE 1.14  --  0.99 1.39*  --   --  1.23  LATICACIDVEN 1.3 0.9  --   --  1.1 0.8  --     Estimated Creatinine Clearance: 46 mL/min (by C-G formula based on SCr of 1.23 mg/dL).    No Known Allergies  Antimicrobials this admission: 1/25 cefepime >> 1/26 1/25 vancomycin >> 1/26 1/25 metronidazole >> 1/26 1/26 Cefazolin >> 1/26 Rifampin >>  Dose adjustments this admission: n/a  Microbiology results: 1/25 rBCx: NGTD 1/25 UCx: Sent 1/23 [facility] BCX: Staph epidermidis - R-erythro;   Thank you for allowing pharmacy to be a part of this patient's care.  Tamela Gammon, PharmD 04/19/2018 2:50 PM PGY-1 Pharmacy Resident Direct Phone: [phone] Please check [email or web address] for unit-specific pharmacist phone numbers

## 2018-04-19 NOTE — Progress Notes (Signed)
Heparin drip increased to 12.5 mL/hr per pharmacist recommendation. No bleeding/bruising/hematoma noted. Will continue to monitor.

## 2018-04-19 NOTE — Consult Note (Signed)
[facility] for Infectious Disease  Total days of antibiotics 2         Reason for Consult: CoNS bacteremia in patient AVR   Referring Physician:singh  Principal Problem:   Sepsis ([place]) Active Problems:   Aortic valve disorder   CAD (coronary artery disease)   H/O aortic valve replacement   Type 2 diabetes mellitus (HCC)   AMS (altered mental status)   HPI: [name] is a 85 y.o. male with hx of CAD, AS s/p AVR, T2DDM, dementia, SNF resident admitted for fever, AMS in the setting of previously receiving iron infusion the day prior without issues. On 1/23, In the ED found to be aferile but ill appearing, tachy at 122, labs showed WBC 11.4, c/w SIRS. Started on a dose of cefepime and vanco. He quickly improved and presumption was thought this was viral illness due to quick onset. The patients initially had blood + 1 bottle in 1 set only for staph species that was thought to be contaminant. He was discharged on 1/24 to only be readmitted the following for having rigors, high fever of 102F, hypotension,tachycardia, tachypnea c/w sepsis. In the meantime, more blood cx bottles became positive from the first admit on 1/22 draw (3 of 4 bottles). There is concern for endocarditis given he has hx of AVR. He was restarted on sepsis protocol of vanco, cefepime, metronidazole. The patient reported that his symptoms started after receiving blood and iron transfusion. His daughter reports that his hand was swollen in the setting of first fever, but unclear if tender and erythematous. Presently his dorsum of hands, where previous PIV were are only showing ecchymosis.  Past Medical History:  Diagnosis Date  . Anemia    Iron deficiency  . Aortic stenosis    s/p pericardial AVR  . Aortic valve disorder 06/23/2008   Qualifier: Diagnosis of  By: Mare Ferrari, RMA, Sherri    . Arthritis   . Benign prostatic hypertrophy   . CAD (coronary artery disease) 01/30/2011  . Carotid bruit 08/21/2016  . COLONIC  POLYPS 06/23/2008   Qualifier: Diagnosis of  By: Mare Ferrari, RMA, Sherri    . Coronary artery disease    s/p CABG 08/2010    . Diabetes mellitus   . Dyslipidemia 10/15/2017  . Essential hypertension 08/21/2016  . H/O aortic valve replacement 08/21/2016  . Hyperlipidemia   . Hyperlipidemia LDL goal <70 06/23/2008   Qualifier: Diagnosis of  By: Mare Ferrari, RMA, Sherri    . Leg swelling 10/15/2017  . Orthostatic hypotension 06/22/2012  . Palpitations 08/21/2016  . Precordial pain 09/18/2011  . Type 2 diabetes mellitus with hyperlipidemia ([place]) 06/23/2008   Qualifier: Diagnosis of  By: Mare Ferrari, RMA, Sherri    . Unstable angina ([place]) 07/24/2011    Allergies: No Known Allergies   MEDICATIONS: . Chlorhexidine Gluconate Cloth  6 each Topical Q0600  . insulin aspart  0-9 Units Subcutaneous Q6H  . pantoprazole  40 mg Oral Daily  . rosuvastatin  40 mg Oral q1800    Social History   Tobacco Use  . Smoking status: Former Smoker    Types: Cigars    Last attempt to quit: 09/17/1993    Years since quitting: 24.6  . Smokeless tobacco: Never Used  Substance Use Topics  . Alcohol use: Yes    Comment: occassional  . Drug use: No    Family History  Problem Relation Age of Onset  . Cancer Mother   . Diabetes Father   . Hypertension  Other   . Diabetes Other     Review of Systems  Constitutional: positive for fever, chills, diaphoresis, activity change, appetite change, fatigue and unexpected weight change.  HENT: Negative for congestion, sore throat, rhinorrhea, sneezing, trouble swallowing and sinus pressure.  Eyes: Negative for photophobia and visual disturbance.  Respiratory: Negative for cough, chest tightness, shortness of breath, wheezing and stridor.  Cardiovascular: Negative for chest pain, palpitations and leg swelling.  Gastrointestinal: Negative for nausea, vomiting, abdominal pain, diarrhea, constipation, blood in stool, abdominal distention and anal bleeding.  Genitourinary: Negative for  dysuria, hematuria, flank pain and difficulty urinating.  Musculoskeletal: Negative for myalgias, back pain, joint swelling, arthralgias and gait problem.  Skin: Negative for color change, pallor, rash and wound.  Neurological: Negative for dizziness, tremors, weakness and light-headedness.  Hematological: Negative for adenopathy. Does not bruise/bleed easily.  Psychiatric/Behavioral: Negative for behavioral problems, confusion, sleep disturbance, dysphoric mood, decreased concentration and agitation.   OBJECTIVE: Temp:  [97.7 F (36.5 C)-98.5 F (36.9 C)] 98.5 F (36.9 C) (01/26 0530) Pulse Rate:  [72-110] 75 (01/26 0600) Resp:  [18-29] 21 (01/26 0600) BP: (89-107)/(57-70) 101/60 (01/26 0530) SpO2:  [94 %-98 %] 95 % (01/26 0600) Weight:  [79.4 kg] 79.4 kg (01/26 0019) Physical Exam  Constitutional: He is oriented to person, place, and time. He appears well-developed and well-nourished. No distress.  HENT:  Mouth/Throat: Oropharynx is clear and moist. No oropharyngeal exudate.  Cardiovascular: Normal rate, regular rhythm and normal heart sounds. Exam reveals no gallop and no friction rub.  No murmur heard.  Pulmonary/Chest: Effort normal and breath sounds normal. No respiratory distress. He has no wheezes.  Abdominal: Soft. Bowel sounds are normal. He exhibits no distension. There is no tenderness.  Lymphadenopathy:  no cervical adenopathy.  Neurological: He is alert and oriented to person, place, and time.  Skin: Skin is warm and dry. No rash noted. No erythema except 2 red lesions slightly raised c/w macular patch 3 cm ovale in medial calf bilaterally. Psychiatric: He has a normal mood and affect. His behavior is normal.   LABS: Results for orders placed or performed during the hospital encounter of 04/18/18 (from the past 48 hour(s))  Comprehensive metabolic panel     Status: Abnormal   Collection Time: 04/18/18 12:19 PM  Result Value Ref Range   Sodium 138 135 - 145 mmol/L    Potassium 3.0 (L) 3.5 - 5.1 mmol/L   Chloride 109 98 - 111 mmol/L   CO2 20 (L) 22 - 32 mmol/L   Glucose, Bld 174 (H) 70 - 99 mg/dL   BUN 21 8 - 23 mg/dL   Creatinine, Ser 1.39 (H) 0.61 - 1.24 mg/dL   Calcium 8.1 (L) 8.9 - 10.3 mg/dL   Total Protein 5.8 (L) 6.5 - 8.1 g/dL   Albumin 2.7 (L) 3.5 - 5.0 g/dL   AST 38 15 - 41 U/L   ALT 20 0 - 44 U/L   Alkaline Phosphatase 70 38 - 126 U/L   Total Bilirubin 1.2 0.3 - 1.2 mg/dL   GFR calc non Af Amer 47 (L) >60 mL/min   GFR calc Af Amer 54 (L) >60 mL/min   Anion gap 9 5 - 15    Comment: Performed at [facility], [address] [place], [place] [address]  CBC with Differential     Status: Abnormal   Collection Time: 04/18/18 12:19 PM  Result Value Ref Range   WBC 8.3 4.0 - 10.5 K/uL   RBC 3.39 (L)  4.22 - 5.81 MIL/uL   Hemoglobin 9.6 (L) 13.0 - 17.0 g/dL    Comment: DELTA CHECK NOTED POST TRANSFUSION SPECIMEN    HCT 29.9 (L) 39.0 - 52.0 %   MCV 88.2 80.0 - 100.0 fL   MCH 28.3 26.0 - 34.0 pg   MCHC 32.1 30.0 - 36.0 g/dL   RDW 15.2 11.5 - 15.5 %   Platelets 149 (L) 150 - 400 K/uL   nRBC 0.0 0.0 - 0.2 %   Neutrophils Relative % 85 %   Neutro Abs 7.0 1.7 - 7.7 K/uL   Lymphocytes Relative 7 %   Lymphs Abs 0.6 (L) 0.7 - 4.0 K/uL   Monocytes Relative 7 %   Monocytes Absolute 0.6 0.1 - 1.0 K/uL   Eosinophils Relative 0 %   Eosinophils Absolute 0.0 0.0 - 0.5 K/uL   Basophils Relative 0 %   Basophils Absolute 0.0 0.0 - 0.1 K/uL   Immature Granulocytes 1 %   Abs Immature Granulocytes 0.04 0.00 - 0.07 K/uL    Comment: Performed at [facility], [address] [place], [place] [address]  Protime-INR     Status: None   Collection Time: 04/18/18 12:19 PM  Result Value Ref Range   Prothrombin Time 14.5 11.4 - 15.2 seconds   INR 1.14     Comment: Performed at [facility], [address], [place], [place] [address]  Lactic acid, plasma     Status: None   Collection Time: 04/18/18 12:21 PM  Result Value Ref Range   Lactic Acid, Venous 1.1  0.5 - 1.9 mmol/L    Comment: Performed at [facility], [address], [place], [place] [address]  Culture, blood (Routine x 2)     Status: None (Preliminary result)   Collection Time: 04/18/18 12:21 PM  Result Value Ref Range   Specimen Description      BLOOD RIGHT ARM BOTTLES DRAWN AEROBIC AND ANAEROBIC   Special Requests Blood Culture adequate volume    Culture      NO GROWTH < 24 HOURS Performed at [facility], [address] [place], [place] [address]    Report Status PENDING   Culture, blood (Routine x 2)     Status: None (Preliminary result)   Collection Time: 04/18/18 12:49 PM  Result Value Ref Range   Specimen Description      BLOOD LEFT HAND BOTTLES DRAWN AEROBIC AND ANAEROBIC   Special Requests Blood Culture adequate volume    Culture      NO GROWTH < 24 HOURS Performed at [facility], [address] [place],  [address]    Report Status PENDING   Urinalysis, Routine w reflex microscopic     Status: Abnormal   Collection Time: 04/18/18  1:16 PM  Result Value Ref Range   Color, Urine YELLOW YELLOW   APPearance CLOUDY (A) CLEAR   Specific Gravity, Urine 1.017 1.005 - 1.030   pH 5.0 5.0 - 8.0   Glucose, UA NEGATIVE NEGATIVE mg/dL   Hgb urine dipstick LARGE (A) NEGATIVE   Bilirubin Urine NEGATIVE NEGATIVE   Ketones, ur 20 (A) NEGATIVE mg/dL   Protein, ur 30 (A) NEGATIVE mg/dL   Nitrite NEGATIVE NEGATIVE   Leukocytes, UA NEGATIVE NEGATIVE   WBC, UA 0-5 0 - 5 WBC/hpf   Bacteria, UA RARE (A) NONE SEEN   Squamous Epithelial / LPF 0-5 0 - 5   Mucus PRESENT     Comment: Performed at [facility], [address], [place], [place]  [address]  Lactic acid, plasma     Status: None   Collection Time: 04/18/18  3:51 PM  Result Value Ref Range   Lactic Acid, Venous 0.8 0.5 - 1.9 mmol/L    Comment: Performed at [facility], [address], [place], [place] [address]  Magnesium     Status: Abnormal   Collection Time: 04/18/18  3:51 PM  Result Value Ref Range    Magnesium 1.6 (L) 1.7 - 2.4 mg/dL    Comment: Performed at [facility], [address] [place], [place] [address]  Troponin I - Add-On to previous collection     Status: Abnormal   Collection Time: 04/18/18  3:51 PM  Result Value Ref Range   Troponin I 4.48 (HH) <0.03 ng/mL    Comment: CRITICAL RESULT CALLED TO, READ BACK BY AND VERIFIED WITH: GENTRY,R. AT 1746 ON 04/18/2018 BY EVA Performed at [facility], [address], [place], [place] [address]   Influenza panel by PCR (type A & B)     Status: None   Collection Time: 04/18/18  4:59 PM  Result Value Ref Range   Influenza A By PCR NEGATIVE NEGATIVE   Influenza B By PCR NEGATIVE NEGATIVE    Comment: (NOTE) The Xpert Xpress Flu assay is intended as an aid in the diagnosis of  influenza and should not be used as a sole basis for treatment.  This  assay is FDA approved for nasopharyngeal swab specimens only. Nasal  washings and aspirates are unacceptable for Xpert Xpress Flu testing. Performed at [facility], [address] [place], [place] [address]   Troponin I - Now Then [facility]     Status: Abnormal   Collection Time: 04/18/18  6:59 PM  Result Value Ref Range   Troponin I 4.01 (HH) <0.03 ng/mL    Comment: CRITICAL VALUE NOTED.  VALUE IS CONSISTENT WITH PREVIOUSLY REPORTED AND CALLED VALUE. Performed at [facility], [address], [place], [place] [address]   CBG monitoring, ED     Status: Abnormal   Collection Time: 04/18/18  7:20 PM  Result Value Ref Range   Glucose-Capillary 165 (H) 70 - 99 mg/dL  Troponin I - Now Then Q3H     Status: Abnormal   Collection Time: 04/18/18 10:01 PM  Result Value Ref Range   Troponin I 3.59 (HH) <0.03 ng/mL    Comment: CRITICAL VALUE NOTED.  VALUE IS CONSISTENT WITH PREVIOUSLY REPORTED AND CALLED VALUE. Performed at [facility], [address], [place], [place] [address]   Glucose, capillary     Status: Abnormal   Collection Time: 04/18/18 11:57 PM  Result Value Ref Range    Glucose-Capillary 131 (H) 70 - 99 mg/dL  MRSA PCR Screening     Status: None   Collection Time: 04/19/18 12:27 AM  Result Value Ref Range   MRSA by PCR NEGATIVE NEGATIVE    Comment:        The GeneXpert MRSA Assay (FDA approved for NASAL specimens only), is one component of a comprehensive MRSA colonization surveillance program. It is not intended to diagnose MRSA infection nor to guide or monitor treatment for MRSA infections. Performed at [facility], [place] [address], [place], [place] [address]   Brain natriuretic peptide     Status: Abnormal   Collection Time: 04/19/18  2:05 AM  Result Value Ref Range   B Natriuretic Peptide 553.6 (H) 0.0 - 100.0 pg/mL    Comment: Performed at [facility]  [address], [place], [place] [address]  Lipid panel     Status: Abnormal   Collection Time: 04/19/18  2:05 AM  Result Value Ref Range   Cholesterol 69 0 - 200 mg/dL   Triglycerides 70 <150 mg/dL   HDL 16 (L) >40 mg/dL   Total CHOL/HDL Ratio 4.3 RATIO   VLDL 14 0 - 40 mg/dL   LDL Cholesterol 39 0 - 99 mg/dL    Comment:        Total Cholesterol/HDL:CHD Risk Coronary Heart Disease Risk Table                     Men   Women  1/2 Average Risk   3.4   3.3  Average Risk       5.0   4.4  2 X Average Risk   9.6   7.1  3 X Average Risk  23.4   11.0        Use the calculated Patient Ratio above and the CHD Risk Table to determine the patient's CHD Risk.        ATP III CLASSIFICATION (LDL):  <100     mg/dL   Optimal  100-129  mg/dL   Near or Above                    Optimal  130-159  mg/dL   Borderline  160-189  mg/dL   High  >190     mg/dL   Very High Performed at [facility] [address] [place], [place] [address]   Hemoglobin A1c     Status: None   Collection Time: 04/19/18  2:05 AM  Result Value Ref Range   Hgb A1c MFr Bld 5.5 4.8 - 5.6 %    Comment: (NOTE) Pre diabetes:          5.7%-6.4% Diabetes:              >6.4% Glycemic control for    <7.0% adults with diabetes    Mean Plasma Glucose 111.15 mg/dL    Comment: Performed at [place] [address] [place], [place] [address]  Glucose, capillary     Status: Abnormal   Collection Time: 04/19/18  4:04 AM  Result Value Ref Range   Glucose-Capillary 101 (H) 70 - 99 mg/dL  Type and screen [place]     Status: None   Collection Time: 04/19/18  7:42 AM  Result Value Ref Range   ABO/RH(D) O POS    Antibody Screen POS    Sample Expiration 04/22/2018    DAT, IgG      NEG Performed at [facility], [place] [address] [place], [place] [address]   CBC     Status: Abnormal   Collection Time: 04/19/18  7:42 AM  Result Value Ref Range   WBC 8.5 4.0 - 10.5 K/uL   RBC 3.16 (L) 4.22 - 5.81 MIL/uL   Hemoglobin 9.2 (L) 13.0 - 17.0 g/dL   HCT 27.8 (L) 39.0 - 52.0 %   MCV 88.0 80.0 - 100.0 fL   MCH 29.1 26.0 - 34.0 pg   MCHC 33.1 30.0 - 36.0 g/dL   RDW 15.2 11.5 - 15.5 %   Platelets 138 (L) 150 - 400 K/uL    Comment: REPEATED TO VERIFY   nRBC 0.0 0.0 - 0.2 %    Comment: Performed at [facility], [place] [address], [place], [place]  [address]  Comprehensive metabolic panel     Status: Abnormal   Collection Time: 04/19/18  7:42 AM  Result Value Ref Range   Sodium 140 135 - 145 mmol/L   Potassium 3.3 (L) 3.5 - 5.1 mmol/L   Chloride 111 98 - 111 mmol/L   CO2 21 (L) 22 - 32 mmol/L   Glucose, Bld 109 (H) 70 - 99 mg/dL   BUN 21 8 - 23 mg/dL   Creatinine, Ser 1.23 0.61 - 1.24 mg/dL   Calcium 8.1 (L) 8.9 - 10.3 mg/dL   Total Protein 5.0 (L) 6.5 - 8.1 g/dL   Albumin 2.4 (L) 3.5 - 5.0 g/dL   AST 42 (H) 15 - 41 U/L   ALT 19 0 - 44 U/L   Alkaline Phosphatase 55 38 - 126 U/L   Total Bilirubin 0.9 0.3 - 1.2 mg/dL   GFR calc non Af Amer 54 (L) >60 mL/min   GFR calc Af Amer >60 >60 mL/min   Anion gap 8 5 - 15    Comment: Performed at [facility], [place] [address], [place], [place] [address]  Magnesium     Status: None   Collection Time:  04/19/18  7:42 AM  Result Value Ref Range   Magnesium 2.1 1.7 - 2.4 mg/dL    Comment: Performed at [facility] [address] [place], [place] [address]  Glucose, capillary     Status: None   Collection Time: 04/19/18  7:46 AM  Result Value Ref Range   Glucose-Capillary 90 70 - 99 mg/dL  Vitamin B12     Status: None   Collection Time: 04/19/18 10:52 AM  Result Value Ref Range   Vitamin B-12 229 180 - 914 pg/mL    Comment: (NOTE) This assay is not validated for testing neonatal or myeloproliferative syndrome specimens for Vitamin B12 levels. Performed at [facility], [place] [address], [place],  [address]   Folate     Status: None   Collection Time: 04/19/18 10:52 AM  Result Value Ref Range   Folate 19.7 >5.9 ng/mL    Comment: Performed at [facility] [address], [place], [place] [address]  Iron and TIBC     Status: Abnormal   Collection Time: 04/19/18 10:52 AM  Result Value Ref Range   Iron 82 45 - 182 ug/dL   TIBC 192 (L) 250 - 450 ug/dL   Saturation Ratios 43 (H) 17.9 - 39.5 %   UIBC 110 ug/dL    Comment: Performed at [facility], [place] [address] [place], [place] [address]  Ferritin     Status: Abnormal   Collection Time: 04/19/18 10:52 AM  Result Value Ref Range   Ferritin 349 (H) 24 - 336 ng/mL    Comment: Performed at [facility], [place] [address], [place], [place] [address]  Reticulocytes     Status: Abnormal   Collection Time: 04/19/18 10:52 AM  Result Value Ref Range   Retic Ct Pct 1.3 0.4 - 3.1 %   RBC. 3.16 (L) 4.22 - 5.81 MIL/uL   Retic Count, Absolute 41.4 19.0 - 186.0 K/uL   Immature Retic Fract 9.4 2.3 - 15.9 %    Comment: Performed at [facility] [address], [place], [place] [address]  Glucose, capillary     Status: Abnormal   Collection Time: 04/19/18 11:53 AM  Result Value Ref Range   Glucose-Capillary 158 (H) 70 - 99  mg/dL    MICRO: 1/22 blood cx staph epi Organism ID, Bacteria STAPHYLOCOCCUS  EPIDERMIDIS   Resulting Agency [facility]  Susceptibility    Staphylococcus epidermidis    MIC    CIPROFLOXACIN <=0.5 SENSI... Sensitive    CLINDAMYCIN <=0.25 SENS... Sensitive    ERYTHROMYCIN >=8 RESISTANT  Resistant    GENTAMICIN <=0.5 SENSI... Sensitive    Inducible Clindamycin NEGATIVE  Sensitive    OXACILLIN <=0.25 SENS... Sensitive    RIFAMPIN <=0.5 SENSI... Sensitive    TETRACYCLINE <=1 SENSITIVE  Sensitive    TRIMETH/SULFA <=10 SENSIT... Sensitive    VANCOMYCIN 1 SENSITIVE  Sensitive         Susceptibility Comments     1/25 blood cx PENDING IMAGING: Ct Head Wo Contrast  Result Date: 04/18/2018 CLINICAL DATA:  Altered mental status beginning 0800 hours today. Fever. EXAM: CT HEAD WITHOUT CONTRAST TECHNIQUE: Contiguous axial images were obtained from the base of the skull through the vertex without intravenous contrast. COMPARISON:  MRI 01/26/2018 FINDINGS: Brain: Chronic brain atrophy. Chronic small-vessel ischemic changes of the cerebral hemispheric white matter. Chronic ventriculomegaly presumed secondary to central atrophy. No sign of acute infarction, mass lesion, change in ventricular size or extra-axial collection. Vascular: There is atherosclerotic calcification of the major vessels at the base of the brain. Skull: Negative Sinuses/Orbits: Clear/normal Other: None IMPRESSION: No acute finding by CT. Atrophy and chronic small-vessel ischemic changes. Chronic ventriculomegaly felt consistent with central atrophy. Electronically Signed   By: Nelson Chimes M.D.   On: 04/18/2018 16:24   Dg Chest Port 1 View  Result Date: 04/18/2018 CLINICAL DATA:  Rales at right lung base. EXAM: PORTABLE CHEST 1 VIEW COMPARISON:  04/16/2018 FINDINGS: Mild cardiac enlargement and aortic atherosclerosis. There is diffuse pulmonary vascular congestion there is asymmetric opacification of the left midlung and left base. IMPRESSION: 1. Pulmonary vascular congestion with asymmetric increased  opacification of the left midlung and left base. Electronically Signed   By: Kerby Moors M.D.   On: 04/18/2018 13:18   Assessment/Plan:  Staph epi bacteremia presenting with sepsis. Pt has hx of AVR thus there is concern for endocarditis  - will switch abtx to cefazolin plus rifampin - please get TEE to evaluate heart valve - if no vegetation, can d/c rifampin and treat with 2 wk of cefazolin - if + TEE, will need 6 wk of IV abtx - await echo results - please wait before placing any central lines - source of infection unclear if possibly introduced from getting PIV for blood and iron infusion pirior to admit  Fever= will continue to monitor. Appears improving.  Dr Linus Salmons to see tomorrow.

## 2018-04-19 NOTE — Consult Note (Addendum)
Cardiology Consultation:   Patient ID: [name] MRN: [id number]; DOB: 1932-05-30  Admit date: 04/18/2018 Date of Consult: 04/19/2018  Primary Care Provider: Chipper Herb, MD Primary Cardiologist: Minus Breeding, MD    Patient Profile:   [name] is a 85 y.o. male with a hx of CAD, AS s/p CABG-AVR in 2012, HTN, HLD, DM2, dementia, who is being seen today for the evaluation of elevated troponin at the request of Dr. Denton Brick.  History of Present Illness:   [name] is a 85 y.o. male with a hx of CAD, AS s/p CABG-AVR in 2012, HTN, HLD, DM2, dementia, who is being seen today for the evaluation of elevated troponin.  The patient has multiple cardiac comorbidities and follows in clinic with Dr. Percival Spanish. He was last seen in clinic in November 2019, at which time he denied any cardiac complaints. However, he was noted to be frail and reportedly had been experiencing falls- this was attributed to his progressive dementia.  He was recently hospitalized 1/23-1/24 with vomiting and confusion following a blood transfusion for anemia. His symptoms were attributed to viral infection.   He presented back to the [facility] ED this evening with fever and confusion that worsened following discharge. He denied chest pain. In the ED, pt was febrile to 102.8 with HR 120s, SBP 90s. Troponin was elevated at 4.8, which decreased to 4 on repeat check. ECG showed sinus tachycardia with nonspecific IVCD and Q waves with ST segment convexity that is new from prior ECGs as early as 1/23. CXR showed vascular congestion. Heparin was deferred due to his anemia, he was started on antibiotics and transferred to [facility] for further care.   On arrival to [facility], HR 70s, SBP 100s. Repeat ECG is pending. On my arrival, the patient is sleeping. On waking, he is AOx3 but falls asleep during interview. He denies chest pain, dyspnea, or any other complaints. The majority of the history is gathered from his  daughter at bedside, who reports that his main recent symptoms have been fever and confusion. She does note that he reported a single episode of chest pain one day recently but has otherwise not demonstrated or reported any chest pain or dyspnea or other cardiac symptoms.   Past Medical History:  Diagnosis Date  . Anemia    Iron deficiency  . Aortic stenosis    s/p pericardial AVR  . Aortic valve disorder 06/23/2008   Qualifier: Diagnosis of  By: Mare Ferrari, RMA, Sherri    . Arthritis   . Benign prostatic hypertrophy   . CAD (coronary artery disease) 01/30/2011  . Carotid bruit 08/21/2016  . COLONIC POLYPS 06/23/2008   Qualifier: Diagnosis of  By: Mare Ferrari, RMA, Sherri    . Coronary artery disease    s/p CABG 08/2010    . Diabetes mellitus   . Dyslipidemia 10/15/2017  . Essential hypertension 08/21/2016  . H/O aortic valve replacement 08/21/2016  . Hyperlipidemia   . Hyperlipidemia LDL goal <70 06/23/2008   Qualifier: Diagnosis of  By: Mare Ferrari, RMA, Sherri    . Leg swelling 10/15/2017  . Orthostatic hypotension 06/22/2012  . Palpitations 08/21/2016  . Precordial pain 09/18/2011  . Type 2 diabetes mellitus with hyperlipidemia ([place]) 06/23/2008   Qualifier: Diagnosis of  By: Mare Ferrari, RMA, Sherri    . Unstable angina ([place]) 07/24/2011    Past Surgical History:  Procedure Laterality Date  . AORTIC VALVE REPLACEMENT  08/24/10   73mm pericardial tissue valve  .  COLONOSCOPY WITH PROPOFOL N/A 12/28/2017   Procedure: COLONOSCOPY WITH PROPOFOL;  Surgeon: Rogene Houston, MD;  [facility];  Service: Endoscopy;  Laterality: N/A;  . CORONARY ARTERY BYPASS GRAFT  08/24/10   LIMA to LAD, SVG to ramus intermediate, SVG to diagonal  . ESOPHAGOGASTRODUODENOSCOPY (EGD) WITH PROPOFOL N/A 12/28/2017   Procedure: ESOPHAGOGASTRODUODENOSCOPY (EGD) WITH PROPOFOL;  Surgeon: Rogene Houston, MD;  Location: [facility];  Service: Endoscopy;  Laterality: N/A;  . HERNIA REPAIR    . knee replacements     x 2  .  POLYPECTOMY  12/28/2017   Procedure: POLYPECTOMY;  Surgeon: Rogene Houston, MD;  Location: [facility];  Service: Endoscopy;;  transverse colon      Home Medications:  Prior to Admission medications   Medication Sig Start Date End Date Taking? Authorizing Provider  ACCU-CHEK SOFTCLIX LANCETS lancets Use as instructed 09/15/15  Yes Chipper Herb, MD  aspirin 81 MG tablet Take 1 tablet (81 mg total) by mouth every morning. 12/25/17  Yes Chipper Herb, MD  escitalopram (LEXAPRO) 20 MG tablet Take 20 mg by mouth daily.   Yes [provider]  furosemide (LASIX) 20 MG tablet Take 1 tablet (20 mg total) by mouth daily. Take 40 mg on Mon, Wed, Fri and 20 mg all other days. 04/17/18  Yes Barton Dubois, MD  glucose blood test strip CHECK BLOOD SUGAR UP TO 4 TIMES A DAY 03/31/18  Yes Chipper Herb, MD  metFORMIN (GLUCOPHAGE) 500 MG tablet Take 1 tablet (500 mg total) by mouth 2 (two) times daily. 12/25/17  Yes Chipper Herb, MD  metoprolol succinate (TOPROL-XL) 25 MG 24 hr tablet Take 0.5 tablets (12.5 mg total) by mouth daily before breakfast. 01/21/18  Yes Chipper Herb, MD  nitroGLYCERIN (NITROSTAT) 0.4 MG SL tablet Place 1 tablet (0.4 mg total) under the tongue every 5 (five) minutes x 3 doses as needed for chest pain. 08/21/16  Yes Minus Breeding, MD  pantoprazole (PROTONIX) 40 MG tablet TAKE (1) TABLET TWICE A DAY BEFOR A MEAL 02/24/18  Yes Chipper Herb, MD  polyethylene glycol [facility]) packet Take 17 g by mouth daily. 02/06/18  Yes Chipper Herb, MD  risperiDONE (RISPERDAL) 0.25 MG tablet Take 1 tablet (0.25 mg total) by mouth 3 (three) times daily. 04/02/18  Yes Chipper Herb, MD  rosuvastatin (CRESTOR) 40 MG tablet Take 1 tablet (40 mg total) by mouth every evening. 12/25/17 04/17/18  Chipper Herb, MD    Inpatient Medications: Scheduled Meds: . Chlorhexidine Gluconate Cloth  6 each Topical Q0600  . insulin aspart  0-9 Units Subcutaneous Q6H  . rosuvastatin   40 mg Oral q1800   Continuous Infusions: . ceFEPime (MAXIPIME) IV Stopped (04/18/18 2104)  . metronidazole Stopped (04/18/18 2130)  . vancomycin     PRN Meds: bisacodyl, ondansetron **OR** ondansetron (ZOFRAN) IV  Allergies:   No Known Allergies  Social History:   Social History   Socioeconomic History  . Marital status: Widowed    Spouse name: Not on file  . Number of children: 4  . Years of education: 63  . Highest education level: 11th grade  Occupational History  . Occupation: Retired  Scientific laboratory technician  . Financial resource strain: Not on file  . Food insecurity:    Worry: Not on file    Inability: Not on file  . Transportation needs:    Medical: Not on file    Non-medical: Not  on file  Tobacco Use  . Smoking status: Former Smoker    Types: Cigars    Last attempt to quit: 09/17/1993    Years since quitting: 24.6  . Smokeless tobacco: Never Used  Substance and Sexual Activity  . Alcohol use: Yes    Comment: occassional  . Drug use: No  . Sexual activity: Yes  Lifestyle  . Physical activity:    Days per week: Not on file    Minutes per session: Not on file  . Stress: Not on file  Relationships  . Social connections:    Talks on phone: Not on file    Gets together: Not on file    Attends religious service: Not on file    Active member of club or organization: Not on file    Attends meetings of clubs or organizations: Not on file    Relationship status: Not on file  . Intimate partner violence:    Fear of current or ex partner: Not on file    Emotionally abused: Not on file    Physically abused: Not on file    Forced sexual activity: Not on file  Other Topics Concern  . Not on file  Social History Narrative  . Not on file    Family History:    Family History  Problem Relation Age of Onset  . Cancer Mother   . Diabetes Father   . Hypertension Other   . Diabetes Other      ROS:  Please see the history of present illness.  ROS otherwise limited due  to his sleepiness and confusion and dementia  Physical Exam/Data:   Vitals:   04/18/18 2130 04/18/18 2200 04/18/18 2352 04/19/18 0019  BP: 96/65 100/64 (!) 102/57   Pulse: 82 77 72   Resp: 20 (!) 24 20   Temp:    97.7 F (36.5 C)  TempSrc:      SpO2: 95% 97% 97%   Weight:    79.4 kg  Height:    5\' 7"  (1.702 m)    Intake/Output Summary (Last 24 hours) at 04/19/2018 0155 Last data filed at 04/18/2018 2133 Gross per 24 hour  Intake 1204.89 ml  Output -  Net 1204.89 ml   Last 3 Weights 04/19/2018 04/18/2018 04/16/2018  Weight (lbs) 175 lb 0.7 oz 170 lb 13.7 oz 170 lb 13.7 oz  Weight (kg) 79.4 kg 77.5 kg 77.5 kg     Body mass index is 27.42 kg/m.  General:  Elderly man in no acute distres s HEENT: normal Neck: no apparent JVD Endocrine:  No thryomegaly Cardiac:  normal S1, S2; RRR; minimal systolic murmur Lungs:  Normal WOB with some coarse breath sounds Abd: soft, non-tender  Ext: no edema Musculoskeletal:  No deformities, BUE and BLE strength normal and equal Skin: warm and dry  Neuro: No focal abnormalities noted Psych:  Sleepy  EKG:  The EKG was personally reviewed and demonstrates:  sinus tachycardia with nonspecific IVCD and Q waves with ST segment convexity that is new from prior ECGs as early as 1/23, ST depressions in V2-V4, T wave flattening/inversion in inferior leads Telemetry:  Telemetry was personally reviewed and demonstrates:  Sinus rhythm with no episodes  Relevant CV Studies:  Echocardiogram 01/2018: - Left ventricle: The cavity size was normal. Wall thickness was   increased in a pattern of mild LVH. Systolic function was   vigorous. The estimated ejection fraction was in the range of 65%   to 70%. Wall motion  was normal; there were no regional wall   motion abnormalities. Doppler parameters are consistent with   abnormal left ventricular relaxation (grade 1 diastolic   dysfunction). - Aortic valve: A 23 mm [facility] Ease pericardial prosthesis    is in the aortic position with grossly normal function based on   gradients. There was no significant regurgitation. Mean gradient   (S): 18 mm Hg. Peak gradient (S): 35 mm Hg. Valve area (VTI):   1.47 cm^2. - Mitral valve: Mildly to moderately calcified annulus. There was   trivial regurgitation. - Left atrium: The atrium was moderately dilated. - Right atrium: Central venous pressure (est): 3 mm Hg. - Atrial septum: No defect or patent foramen ovale was identified. - Tricuspid valve: There was trivial regurgitation. - Pulmonary arteries: Systolic pressure could not be accurately   estimated. - Pericardium, extracardiac: There was no pericardial effusion.  Laboratory Data:  Chemistry Recent Labs  Lab 04/16/18 1837 04/17/18 0553 04/18/18 1219  NA 133* 138 138  K 3.7 3.4* 3.0*  CL 100 109 109  CO2 24 23 20*  GLUCOSE 200* 118* 174*  BUN 21 18 21   CREATININE 1.14 0.99 1.39*  CALCIUM 8.9 8.0* 8.1*  GFRNONAA 59* >60 47*  GFRAA >60 >60 54*  ANIONGAP 9 6 9     Recent Labs  Lab 04/16/18 1837 04/18/18 1219  PROT 6.5 5.8*  ALBUMIN 3.3* 2.7*  AST 26 38  ALT 20 20  ALKPHOS 80 70  BILITOT 0.9 1.2   Hematology Recent Labs  Lab 04/16/18 1837 04/17/18 0553 04/18/18 1219  WBC 11.5* 8.2 8.3  RBC 3.20* 2.61* 3.39*  HGB 9.3* 7.5* 9.6*  HCT 28.9* 23.7* 29.9*  MCV 90.3 90.8 88.2  MCH 29.1 28.7 28.3  MCHC 32.2 31.6 32.1  RDW 14.5 14.7 15.2  PLT 190 146* 149*   Cardiac Enzymes Recent Labs  Lab 04/18/18 1551 04/18/18 1859 04/18/18 2201  TROPONINI 4.48* 4.01* 3.59*   No results for input(s): TROPIPOC in the last 168 hours.  BNPNo results for input(s): BNP, PROBNP in the last 168 hours.  DDimer No results for input(s): DDIMER in the last 168 hours.  Radiology/Studies:  Dg Chest 2 View  Result Date: 04/16/2018 CLINICAL DATA:  Possible sepsis EXAM: CHEST - 2 VIEW COMPARISON:  01/26/2018 FINDINGS: Post sternotomy changes. Mild cardiomegaly with aortic atherosclerosis.  Linear scarring or atelectasis at the left base. Degenerative changes of the spine. No pneumothorax. IMPRESSION: Scarring at the left lung base.  Mild cardiomegaly. Electronically Signed   By: Donavan Foil M.D.   On: 04/16/2018 19:31   Ct Head Wo Contrast  Result Date: 04/18/2018 CLINICAL DATA:  Altered mental status beginning 0800 hours today. Fever. EXAM: CT HEAD WITHOUT CONTRAST TECHNIQUE: Contiguous axial images were obtained from the base of the skull through the vertex without intravenous contrast. COMPARISON:  MRI 01/26/2018 FINDINGS: Brain: Chronic brain atrophy. Chronic small-vessel ischemic changes of the cerebral hemispheric white matter. Chronic ventriculomegaly presumed secondary to central atrophy. No sign of acute infarction, mass lesion, change in ventricular size or extra-axial collection. Vascular: There is atherosclerotic calcification of the major vessels at the base of the brain. Skull: Negative Sinuses/Orbits: Clear/normal Other: None IMPRESSION: No acute finding by CT. Atrophy and chronic small-vessel ischemic changes. Chronic ventriculomegaly felt consistent with central atrophy. Electronically Signed   By: Nelson Chimes M.D.   On: 04/18/2018 16:24   Dg Chest Port 1 View  Result Date: 04/18/2018 CLINICAL DATA:  Rales at right lung base.  EXAM: PORTABLE CHEST 1 VIEW COMPARISON:  04/16/2018 FINDINGS: Mild cardiac enlargement and aortic atherosclerosis. There is diffuse pulmonary vascular congestion there is asymmetric opacification of the left midlung and left base. IMPRESSION: 1. Pulmonary vascular congestion with asymmetric increased opacification of the left midlung and left base. Electronically Signed   By: Kerby Moors M.D.   On: 04/18/2018 13:18    Assessment and Plan:   Elevated troponin with new ECG changes- concerning for late presenting MI History of CABG The patient is admitted with fever and confusion with other nonspecific symptoms the day after discharge for a  similar presenation. He has not endorsed any significant chest pain or dyspnea except for a transient episode of chest pain that his daughter now reports. Troponin was checked and significantly elevated at 4.8, which has now down-trended to 3.6. Review of his ECGs is notable for new, large Q waves with apparent resolving ST elevations in I and AVL; these findings are new since 1/23. His ECG suggests evolving transmural lateral MI which likely occurred in the past two days. Given his absence of symptoms, down-trending troponin, and hemodynamic/electric stability, emergent cardiac catheterization is not warranted. His anemia also makes management of ACS challenging. At this time, would recommend ongoing supportive care of his encephalopathy and suspected infection (which are unlikely related to this MI). -Ideally would recommend ASA and heparin infusion per ACS nomogram. Will defer this decision to primary team to weigh risks/benefits of antithrombotic therapy in the setting of his anemia and recent transfusion. -Echocardiogram ordered  -BNP added to labs -Lipid panel, HgA1c ordered -We will follow along to determine the type and timing of further ischemic evaluation. ADDENDUM: Repeat ECG at [facility] following my assessment shows change in QRS axis and repolarization patterns of limb leads; this ECG looks similar to his baseline ECGs. This suggests that the abnormal ECG findings at [facility] may be due to erroneous lead placement rather than recent transmural MI, discussed above. Recommend continued intermittent ECGs.   HTN BP low on admission. This has been attributed to sepsis. -Holding home antihypertensive agents -Echocardiogram to assess stability of LV function  HLD -Statin, lipid panel as above  History AVR Normal parameters by last TTE      For questions or updates, please contact [facility] Please consult [email or web address] for contact info under     Signed, Nila Nephew, MD    04/19/2018 1:55 AM

## 2018-04-19 NOTE — Progress Notes (Signed)
ANTICOAGULATION CONSULT NOTE - Initial Consult  Pharmacy Consult for Heparin Indication: chest pain/ACS  No Known Allergies  Patient Measurements: Height: 5\' 7"  (170.2 cm) Weight: 175 lb 0.7 oz (79.4 kg) IBW/kg (Calculated) : 66.1 Heparin Dosing Weight: 79.4  Vital Signs: Temp: 98.3 F (36.8 C) (01/26 2025) Temp Source: Oral (01/26 1247) BP: 112/70 (01/26 2046) Pulse Rate: 90 (01/26 2046)  Labs: Recent Labs    04/17/18 0553 04/18/18 1219 04/18/18 1551 04/18/18 1859 04/18/18 2201 04/19/18 0742 04/19/18 2034  HGB 7.5* 9.6*  --   --   --  9.2*  --   HCT 23.7* 29.9*  --   --   --  27.8*  --   PLT 146* 149*  --   --   --  138*  --   LABPROT  --  14.5  --   --   --   --   --   INR  --  1.14  --   --   --   --   --   HEPARINUNFRC  --   --   --   --   --   --  <0.10*  CREATININE 0.99 1.39*  --   --   --  1.23  --   TROPONINI  --   --  4.48* 4.01* 3.59*  --   --     Estimated Creatinine Clearance: 46 mL/min (by C-G formula based on SCr of 1.23 mg/dL).   Medical History: Past Medical History:  Diagnosis Date  . Anemia    Iron deficiency  . Aortic stenosis    s/p pericardial AVR  . Aortic valve disorder 06/23/2008   Qualifier: Diagnosis of  By: Mare Ferrari, RMA, Sherri    . Arthritis   . Benign prostatic hypertrophy   . CAD (coronary artery disease) 01/30/2011  . Carotid bruit 08/21/2016  . COLONIC POLYPS 06/23/2008   Qualifier: Diagnosis of  By: Mare Ferrari, RMA, Sherri    . Coronary artery disease    s/p CABG 08/2010    . Diabetes mellitus   . Dyslipidemia 10/15/2017  . Essential hypertension 08/21/2016  . H/O aortic valve replacement 08/21/2016  . Hyperlipidemia   . Hyperlipidemia LDL goal <70 06/23/2008   Qualifier: Diagnosis of  By: Mare Ferrari, RMA, Sherri    . Leg swelling 10/15/2017  . Orthostatic hypotension 06/22/2012  . Palpitations 08/21/2016  . Precordial pain 09/18/2011  . Type 2 diabetes mellitus with hyperlipidemia ([place]) 06/23/2008   Qualifier: Diagnosis of  By:  Mare Ferrari, RMA, Sherri    . Unstable angina ([place]) 07/24/2011    Medications:  Scheduled:  . Chlorhexidine Gluconate Cloth  6 each Topical Q0600  . insulin aspart  0-9 Units Subcutaneous Q6H  . pantoprazole  40 mg Oral Daily  . rosuvastatin  40 mg Oral q1800    Assessment: [name] is a 85 y.o. male with a hx of CAD, AS s/p CABG-AVR in 2012, HTN, HLD, DM2, dementia, who is being seen today for the evaluation of possible ACS / elevated troponin: 4.48 >4.01>3.59. Of note patient has history of iron deficiency anemia. Patient received blood transfusion on 1/24 during his last admission at [facility]. CBC slight decrease across the board: Hgb 9.6>9.2, hct 29.9 > 27.8, platelets at 149 > 138. Patient received lovenox 40mg  on 1/24; no other PTA anticoaguation.   Will plan for lower range and no bolus for this patient per MD consultation.  PM update: Heparin level <0.1, Will increase the rate.  Goal of Therapy:  Heparin level 0.3-0.5 units/ml Monitor platelets by anticoagulation protocol: Yes   Plan:  No heparin bolus Increase heparin infusion to 1250 units/hr Check anti-Xa level in 8 hours and daily while on heparin Continue to monitor H&H and platelets  Artist Bloom A. Levada Dy, PharmD, [place] Pager: [phone] Please utilize Amion for appropriate phone number to reach the unit pharmacist ([place])

## 2018-04-19 NOTE — Progress Notes (Addendum)
ANTICOAGULATION CONSULT NOTE - Initial Consult  Pharmacy Consult for Heparin Indication: chest pain/ACS  No Known Allergies  Patient Measurements: Height: 5\' 7"  (170.2 cm) Weight: 175 lb 0.7 oz (79.4 kg) IBW/kg (Calculated) : 66.1 Heparin Dosing Weight: 79.4  Vital Signs: Temp: 98.5 F (36.9 C) (01/26 0530) Temp Source: Oral (01/26 0530) BP: 101/60 (01/26 0530) Pulse Rate: 75 (01/26 0600)  Labs: Recent Labs    04/17/18 0553 04/18/18 1219 04/18/18 1551 04/18/18 1859 04/18/18 2201 04/19/18 0742  HGB 7.5* 9.6*  --   --   --  9.2*  HCT 23.7* 29.9*  --   --   --  27.8*  PLT 146* 149*  --   --   --  138*  LABPROT  --  14.5  --   --   --   --   INR  --  1.14  --   --   --   --   CREATININE 0.99 1.39*  --   --   --  1.23  TROPONINI  --   --  4.48* 4.01* 3.59*  --     Estimated Creatinine Clearance: 46 mL/min (by C-G formula based on SCr of 1.23 mg/dL).   Medical History: Past Medical History:  Diagnosis Date  . Anemia    Iron deficiency  . Aortic stenosis    s/p pericardial AVR  . Aortic valve disorder 06/23/2008   Qualifier: Diagnosis of  By: Mare Ferrari, RMA, Sherri    . Arthritis   . Benign prostatic hypertrophy   . CAD (coronary artery disease) 01/30/2011  . Carotid bruit 08/21/2016  . COLONIC POLYPS 06/23/2008   Qualifier: Diagnosis of  By: Mare Ferrari, RMA, Sherri    . Coronary artery disease    s/p CABG 08/2010    . Diabetes mellitus   . Dyslipidemia 10/15/2017  . Essential hypertension 08/21/2016  . H/O aortic valve replacement 08/21/2016  . Hyperlipidemia   . Hyperlipidemia LDL goal <70 06/23/2008   Qualifier: Diagnosis of  By: Mare Ferrari, RMA, Sherri    . Leg swelling 10/15/2017  . Orthostatic hypotension 06/22/2012  . Palpitations 08/21/2016  . Precordial pain 09/18/2011  . Type 2 diabetes mellitus with hyperlipidemia ([place]) 06/23/2008   Qualifier: Diagnosis of  By: Mare Ferrari, RMA, Sherri    . Unstable angina ([place]) 07/24/2011    Medications:  Scheduled:  . Chlorhexidine  Gluconate Cloth  6 each Topical Q0600  . insulin aspart  0-9 Units Subcutaneous Q6H  . pantoprazole  40 mg Oral Daily  . rosuvastatin  40 mg Oral q1800    Assessment: [name] is a 85 y.o. male with a hx of CAD, AS s/p CABG-AVR in 2012, HTN, HLD, DM2, dementia, who is being seen today for the evaluation of possible ACS / elevated troponin: 4.48 >4.01>3.59. Of note patient has history of iron deficiency anemia. Patient received blood transfusion on 1/24 during his last admission at [facility]. CBC slight decrease across the board: Hgb 9.6>9.2, hct 29.9 > 27.8, platelets at 149 > 138. Patient received lovenox 40mg  on 1/24; no other PTA anticoaguation.   Will plan for lower range and no bolus for this patient per MD consultation.  Goal of Therapy:  Heparin level 0.3-0.5 units/ml Monitor platelets by anticoagulation protocol: Yes   Plan:  No heparin bolus Start heparin infusion at 950 units/hr Check anti-Xa level in 8 hours and daily while on heparin Continue to monitor H&H and platelets  Thank you for allowing pharmacy to be a part  of this patient's care.  Tamela Gammon, PharmD 04/19/2018 12:34 PM PGY-1 Pharmacy Resident Direct Phone: [phone] Please check [email or web address] for unit-specific pharmacist phone numbers

## 2018-04-19 NOTE — Progress Notes (Signed)
PROGRESS NOTE                                                                                                                                                                                                             Patient Demographics:    [name], is a 85 y.o. male, DOB - [birth year], TWS:[id number]  Admit date - 04/18/2018   Admitting Physician Bethena Roys, MD  Outpatient Primary MD for the patient is Chipper Herb, MD  LOS - 1  Chief Complaint  Patient presents with  . Altered Mental Status       Brief Narrative   [name] is a 85 y.o. male with medical history significant for HTN, AV replacement, BPH, CAD, DM2, CVA, who was brought to the [facility] ED with complaints of confusion and fevers.  Patient was discharged from the hospital yesterday- 1/23 - 1/24, admitted for similar complaints of fevers, ? confusion as the patient has baseline dementia.  Patient had had a transfusion the day before admission 1/22, fevers were attributed to transfusion reaction as focus of infection was not identified.   Through blood cultures blood cultures grew staph epidermidis in 3 out of 4 bottles, he was started on antibiotics, later on his troponin trended up and was transferred to [facility] for further care.   Subjective:    [name] today has, No headache, No chest pain, No abdominal pain - No Nausea, No new weakness tingling or numbness, No Cough - SOB.     Assessment  & Plan :     1.  Sepsis with coag negative staph bacteremia.  Has history of AVR, currently on broad-spectrum antibiotics will taper down to vancomycin only at this time.  No cough or shortness of breath, no skin wounds.  TEE ordered.  Surveillance cultures negative thus far.  ID to follow.  Toxic encephalopathy which has resolved.  2.  Non-STEMI in a patient with AVR and CAD with CABG in 2012  history.  No chest pain, troponin seems to have peaked, heparin drip for another 24 hours,  continue statin.  Blood pressure too soft for beta-blocker.  Cardiology on board.  3.  Hypokalemia.  Replaced.  4.  HX of iron deficiency anemia.  Received transfusion on last admission, currently stable placed on PPI will monitor, outpatient age-appropriate work-up.  5.  Severe dementia and depression.  Supportive care at risk for delirium family explained.  6.  DM type II.  Recent  A1c was 6.5.  Hold metformin SSI.  CBG (last 3)  Recent Labs    04/18/18 2357 04/19/18 0404 04/19/18 0746  GLUCAP 131* 101* 29     Family Communication  :  Daughter  Code Status :  Full  Disposition Plan  :    Consults  : ID, Cardiology  Procedures  :    TEE  DVT Prophylaxis  :  Heparin gtt  Lab Results  Component Value Date   PLT 138 (L) 04/19/2018    Diet :  Diet Order            Diet Heart Room service appropriate? Yes; Fluid consistency: Thin  Diet effective now               Inpatient Medications Scheduled Meds: . Chlorhexidine Gluconate Cloth  6 each Topical Q0600  . insulin aspart  0-9 Units Subcutaneous Q6H  . rosuvastatin  40 mg Oral q1800   Continuous Infusions: . ceFEPime (MAXIPIME) IV Stopped (04/18/18 2104)  . metronidazole    . vancomycin     PRN Meds:.bisacodyl, ondansetron **OR** ondansetron (ZOFRAN) IV  Antibiotics  :   Anti-infectives (From admission, onward)   Start     Dose/Rate Route Frequency Ordered Stop   04/19/18 1733  metroNIDAZOLE (FLAGYL) IVPB 500 mg  Status:  Discontinued     500 mg 100 mL/hr over 60 Minutes Intravenous Every 12 hours 04/19/18 0843 04/19/18 0856   04/19/18 1330  metroNIDAZOLE (FLAGYL) IVPB 500 mg     500 mg 100 mL/hr over 60 Minutes Intravenous Every 8 hours 04/19/18 0856     04/19/18 1200  vancomycin (VANCOCIN) IVPB 1000 mg/200 mL premix     1,000 mg 200 mL/hr over 60 Minutes Intravenous Every 24 hours 04/18/18 1252     04/18/18 2100  ceFEPIme (MAXIPIME) 2 g in sodium chloride 0.9 % 100 mL IVPB  Status:  Discontinued      2 g 200 mL/hr over 30 Minutes Intravenous Every 12 hours 04/18/18 1252 04/18/18 1432   04/18/18 2100  ceFEPIme (MAXIPIME) 2 g in sodium chloride 0.9 % 100 mL IVPB     2 g 200 mL/hr over 30 Minutes Intravenous Every 24 hours 04/18/18 1432     04/18/18 1245  ceFEPIme (MAXIPIME) 2 g in sodium chloride 0.9 % 100 mL IVPB     2 g 200 mL/hr over 30 Minutes Intravenous  Once 04/18/18 1240 04/18/18 1341   04/18/18 1245  metroNIDAZOLE (FLAGYL) IVPB 500 mg  Status:  Discontinued     500 mg 100 mL/hr over 60 Minutes Intravenous Every 8 hours 04/18/18 1240 04/19/18 0843   04/18/18 1245  vancomycin (VANCOCIN) IVPB 1000 mg/200 mL premix  Status:  Discontinued     1,000 mg 200 mL/hr over 60 Minutes Intravenous  Once 04/18/18 1240 04/18/18 1244   04/18/18 1245  vancomycin (VANCOCIN) 1,500 mg in sodium chloride 0.9 % 500 mL IVPB     1,500 mg 250 mL/hr over 120 Minutes Intravenous  Once 04/18/18 1244 04/18/18 1910          Objective:   Vitals:   04/18/18 2352 04/19/18 0019 04/19/18 0530 04/19/18 0600  BP: (!) 102/57  101/60   Pulse: 72  73 75  Resp: 20  20 (!) 21  Temp:  97.7 F (36.5 C) 98.5 F (36.9 C)   TempSrc:   Oral   SpO2: 97%  98% 95%  Weight:  79.4 kg    Height:  5\' 7"  (  1.702 m)      Wt Readings from Last 3 Encounters:  04/19/18 79.4 kg  04/16/18 77.5 kg  04/10/18 77.6 kg     Intake/Output Summary (Last 24 hours) at 04/19/2018 1033 Last data filed at 04/19/2018 0636 Gross per 24 hour  Intake 1304.89 ml  Output -  Net 1304.89 ml     Physical Exam  Awake Alert, Oriented X 3, No new F.N deficits, Normal affect [place].AT,PERRAL Supple Neck,No JVD, No cervical lymphadenopathy appriciated.  Symmetrical Chest wall movement, Good air movement bilaterally, CTAB RRR,No Gallops,Rubs or new Murmurs, No Parasternal Heave +ve B.Sounds, Abd Soft, No tenderness, No organomegaly appriciated, No rebound - guarding or rigidity. No Cyanosis, Clubbing or edema, No new Rash or bruise        Data Review:    CBC  Recent Labs  Lab 04/16/18 1837 04/17/18 0553 04/18/18 1219 04/19/18 0742  WBC 11.5* 8.2 8.3 8.5  HGB 9.3* 7.5* 9.6* 9.2*  HCT 28.9* 23.7* 29.9* 27.8*  PLT 190 146* 149* 138*  MCV 90.3 90.8 88.2 88.0  MCH 29.1 28.7 28.3 29.1  MCHC 32.2 31.6 32.1 33.1  RDW 14.5 14.7 15.2 15.2  LYMPHSABS 0.8  --  0.6*  --   MONOABS 1.0  --  0.6  --   EOSABS 0.0  --  0.0  --   BASOSABS 0.0  --  0.0  --     Chemistries  Recent Labs  Lab 04/16/18 1837 04/17/18 0553 04/18/18 1219 04/18/18 1551 04/19/18 0742  NA 133* 138 138  --  140  K 3.7 3.4* 3.0*  --  3.3*  CL 100 109 109  --  111  CO2 24 23 20*  --  21*  GLUCOSE 200* 118* 174*  --  109*  BUN 21 18 21   --  21  CREATININE 1.14 0.99 1.39*  --  1.23  CALCIUM 8.9 8.0* 8.1*  --  8.1*  MG  --   --   --  1.6* 2.1  AST 26  --  38  --  42*  ALT 20  --  20  --  19  ALKPHOS 80  --  70  --  55  BILITOT 0.9  --  1.2  --  0.9   ------------------------------------------------------------------------------------------------------------------ Recent Labs    04/19/18 0205  CHOL 69  HDL 16*  LDLCALC 39  TRIG 70  CHOLHDL 4.3    Lab Results  Component Value Date   HGBA1C 5.5 04/19/2018   ------------------------------------------------------------------------------------------------------------------ No results for input(s): TSH, T4TOTAL, T3FREE, THYROIDAB in the last 72 hours.  Invalid input(s): FREET3 ------------------------------------------------------------------------------------------------------------------ No results for input(s): VITAMINB12, FOLATE, FERRITIN, TIBC, IRON, RETICCTPCT in the last 72 hours.  Coagulation profile  Recent Labs  Lab 04/18/18 1219  INR 1.14    No results for input(s): DDIMER in the last 72 hours.  Cardiac Enzymes Recent Labs  Lab 04/18/18 1551 04/18/18 1859 04/18/18 2201  TROPONINI 4.48* 4.01* 3.59*    ------------------------------------------------------------------------------------------------------------------    Component Value Date/Time   BNP 553.6 (H) 04/19/2018 0205    Micro Results Recent Results (from the past 240 hour(s))  Blood Culture (routine x 2)     Status: Abnormal   Collection Time: 04/16/18  6:30 PM  Result Value Ref Range Status   Specimen Description   Final    BLOOD LEFT WRIST Performed at [facility], [address] [place], [place] [address]    Special Requests   Final    BOTTLES DRAWN  AEROBIC AND ANAEROBIC Blood Culture adequate volume Performed at [address] [place], [place] [address]    Culture  Setup Time   Final    GRAM POSITIVE COCCI AEROBIC , ANAEROBIC Gram Stain Report Called to,Read Back By and Verified With: KEMP @ [id number]  [id number] BY HENDERSON L. CRITICAL RESULT CALLED TO, READ BACK BY AND VERIFIED WITH: C KEMP 04/17/18 1728 JDW    Culture (A)  Final    STAPHYLOCOCCUS EPIDERMIDIS SUSCEPTIBILITIES PERFORMED ON PREVIOUS CULTURE WITHIN THE LAST 5 DAYS. Performed at [facility], [place] [address] [place], [place] [address]    Report Status 04/19/2018 FINAL  Final  Blood Culture (routine x 2)     Status: Abnormal   Collection Time: 04/16/18  6:38 PM  Result Value Ref Range Status   Specimen Description   Final    LEFT ANTECUBITAL Performed at [facility], [address] [place], [place] [address]    Special Requests   Final    BOTTLES DRAWN AEROBIC AND ANAEROBIC Blood Culture adequate volume Performed at [facility], [address], [place], [place] [address]    Culture  Setup Time   Final    GRAM POSITIVE COCCI ANAEROBIC Gram Stain Report Called to,Read Back By and Verified With: CARDWELL L. @1401  ON [id number] BY HENDERSON L. GRAM POSITIVE COCCI AEROBIC RESULT PREV. CALLED CRITICAL RESULT CALLED TO, READ BACK BY AND VERIFIED WITH: C KEMP RN 04/17/18 1728 JDW Performed at [facility], [place] [address],  [place], [place] [address]    Culture STAPHYLOCOCCUS EPIDERMIDIS (A)  Final   Report Status 04/19/2018 FINAL  Final   Organism ID, Bacteria STAPHYLOCOCCUS EPIDERMIDIS  Final      Susceptibility   Staphylococcus epidermidis - MIC*    CIPROFLOXACIN <=0.5 SENSITIVE Sensitive     ERYTHROMYCIN >=8 RESISTANT Resistant     GENTAMICIN <=0.5 SENSITIVE Sensitive     OXACILLIN <=0.25 SENSITIVE Sensitive     TETRACYCLINE <=1 SENSITIVE Sensitive     VANCOMYCIN 1 SENSITIVE Sensitive     TRIMETH/SULFA <=10 SENSITIVE Sensitive     CLINDAMYCIN <=0.25 SENSITIVE Sensitive     RIFAMPIN <=0.5 SENSITIVE Sensitive     Inducible Clindamycin NEGATIVE Sensitive     * STAPHYLOCOCCUS EPIDERMIDIS  Blood Culture ID Panel (Reflexed)     Status: Abnormal   Collection Time: 04/16/18  6:38 PM  Result Value Ref Range Status   Enterococcus species NOT DETECTED NOT DETECTED Final   Listeria monocytogenes NOT DETECTED NOT DETECTED Final   Staphylococcus species DETECTED (A) NOT DETECTED Final    Comment: Methicillin (oxacillin) susceptible coagulase negative staphylococcus. Possible blood culture contaminant (unless isolated from more than one blood culture draw or clinical case suggests pathogenicity). No antibiotic treatment is indicated for blood  culture contaminants. CRITICAL RESULT CALLED TO, READ BACK BY AND VERIFIED WITH: C KEMP RN ([facility]) 04/17/18 1728 JDW    Staphylococcus aureus (BCID) NOT DETECTED NOT DETECTED Final   Methicillin resistance NOT DETECTED NOT DETECTED Final   Streptococcus species NOT DETECTED NOT DETECTED Final   Streptococcus agalactiae NOT DETECTED NOT DETECTED Final   Streptococcus pneumoniae NOT DETECTED NOT DETECTED Final   Streptococcus pyogenes NOT DETECTED NOT DETECTED Final   Acinetobacter baumannii NOT DETECTED NOT DETECTED Final   Enterobacteriaceae species NOT DETECTED NOT DETECTED Final   Enterobacter cloacae complex NOT DETECTED NOT DETECTED Final   Escherichia coli NOT DETECTED NOT  DETECTED Final   Klebsiella oxytoca NOT DETECTED NOT DETECTED  Final   Klebsiella pneumoniae NOT DETECTED NOT DETECTED Final   Proteus species NOT DETECTED NOT DETECTED Final   Serratia marcescens NOT DETECTED NOT DETECTED Final   Haemophilus influenzae NOT DETECTED NOT DETECTED Final   Neisseria meningitidis NOT DETECTED NOT DETECTED Final   Pseudomonas aeruginosa NOT DETECTED NOT DETECTED Final   Candida albicans NOT DETECTED NOT DETECTED Final   Candida glabrata NOT DETECTED NOT DETECTED Final   Candida krusei NOT DETECTED NOT DETECTED Final   Candida parapsilosis NOT DETECTED NOT DETECTED Final   Candida tropicalis NOT DETECTED NOT DETECTED Final    Comment: Performed at [facility], [place] [address] [place], [place] [address]  Culture, Urine     Status: None   Collection Time: 04/16/18  6:45 PM  Result Value Ref Range Status   Specimen Description URINE, CLEAN CATCH  Final   Special Requests   Final    NONE Performed at [facility], [address], [place], [place] [address]    Culture NO GROWTH  Final   Report Status 04/18/2018 FINAL  Final  Culture, blood (Routine x 2)     Status: None (Preliminary result)   Collection Time: 04/18/18 12:21 PM  Result Value Ref Range Status   Specimen Description   Final    BLOOD RIGHT ARM BOTTLES DRAWN AEROBIC AND ANAEROBIC   Special Requests Blood Culture adequate volume  Final   Culture   Final    NO GROWTH < 24 HOURS Performed at [facility], [address] [place], [place] [address]    Report Status PENDING  Incomplete  Culture, blood (Routine x 2)     Status: None (Preliminary result)   Collection Time: 04/18/18 12:49 PM  Result Value Ref Range Status   Specimen Description   Final    BLOOD LEFT HAND BOTTLES DRAWN AEROBIC AND ANAEROBIC   Special Requests Blood Culture adequate volume  Final   Culture   Final    NO GROWTH < 24 HOURS Performed at [facility] [address] [place], [place] [address]    Report Status  PENDING  Incomplete  MRSA PCR Screening     Status: None   Collection Time: 04/19/18 12:27 AM  Result Value Ref Range Status   MRSA by PCR NEGATIVE NEGATIVE Final    Comment:        The GeneXpert MRSA Assay (FDA approved for NASAL specimens only), is one component of a comprehensive MRSA colonization surveillance program. It is not intended to diagnose MRSA infection nor to guide or monitor treatment for MRSA infections. Performed at [facility], [place] [address], [place], [place] [address]     Radiology Reports  Dg Chest 2 View  Result Date: 04/16/2018 CLINICAL DATA:  Possible sepsis EXAM: CHEST - 2 VIEW COMPARISON:  01/26/2018 FINDINGS: Post sternotomy changes. Mild cardiomegaly with aortic atherosclerosis. Linear scarring or atelectasis at the left base. Degenerative changes of the spine. No pneumothorax. IMPRESSION: Scarring at the left lung base.  Mild cardiomegaly. Electronically Signed   By: Donavan Foil M.D.   On: 04/16/2018 19:31   Ct Head Wo Contrast  Result Date: 04/18/2018 CLINICAL DATA:  Altered mental status beginning 0800 hours today. Fever. EXAM: CT HEAD WITHOUT CONTRAST TECHNIQUE: Contiguous axial images were obtained from the base of the skull through the vertex without intravenous contrast. COMPARISON:  MRI 01/26/2018 FINDINGS: Brain: Chronic brain atrophy. Chronic small-vessel ischemic changes of the cerebral hemispheric white matter. Chronic ventriculomegaly presumed secondary to  central atrophy. No sign of acute infarction, mass lesion, change in ventricular size or extra-axial collection. Vascular: There is atherosclerotic calcification of the major vessels at the base of the brain. Skull: Negative Sinuses/Orbits: Clear/normal Other: None IMPRESSION: No acute finding by CT. Atrophy and chronic small-vessel ischemic changes. Chronic ventriculomegaly felt consistent with central atrophy. Electronically Signed   By: Nelson Chimes M.D.   On: 04/18/2018 16:24   Dg  Chest Port 1 View  Result Date: 04/18/2018 CLINICAL DATA:  Rales at right lung base. EXAM: PORTABLE CHEST 1 VIEW COMPARISON:  04/16/2018 FINDINGS: Mild cardiac enlargement and aortic atherosclerosis. There is diffuse pulmonary vascular congestion there is asymmetric opacification of the left midlung and left base. IMPRESSION: 1. Pulmonary vascular congestion with asymmetric increased opacification of the left midlung and left base. Electronically Signed   By: Kerby Moors M.D.   On: 04/18/2018 13:18    Time Spent in minutes  30   Lala Lund M.D on 04/19/2018 at 10:33 AM  To page go to [email or web address] - password [facility]

## 2018-04-19 NOTE — Progress Notes (Signed)
Patient transferred from [facility] to 5W 38 with the diagnosis of AMS. Patient is back to his baseline with intermittent forgetfulness. Denied any acute pain. Full assessment to epic completed. Daughter at bedside and did not voice any concern. Will continue to monitor.

## 2018-04-20 ENCOUNTER — Ambulatory Visit (HOSPITAL_COMMUNITY): Payer: Medicare HMO | Admitting: Hematology

## 2018-04-20 ENCOUNTER — Telehealth: Payer: Self-pay | Admitting: Emergency Medicine

## 2018-04-20 ENCOUNTER — Other Ambulatory Visit (HOSPITAL_COMMUNITY): Payer: Medicare HMO

## 2018-04-20 ENCOUNTER — Inpatient Hospital Stay (HOSPITAL_COMMUNITY): Payer: Medicare HMO

## 2018-04-20 DIAGNOSIS — I361 Nonrheumatic tricuspid (valve) insufficiency: Secondary | ICD-10-CM

## 2018-04-20 DIAGNOSIS — A419 Sepsis, unspecified organism: Secondary | ICD-10-CM

## 2018-04-20 DIAGNOSIS — Z952 Presence of prosthetic heart valve: Secondary | ICD-10-CM

## 2018-04-20 DIAGNOSIS — I25810 Atherosclerosis of coronary artery bypass graft(s) without angina pectoris: Secondary | ICD-10-CM

## 2018-04-20 DIAGNOSIS — R7881 Bacteremia: Secondary | ICD-10-CM | POA: Diagnosis present

## 2018-04-20 DIAGNOSIS — R509 Fever, unspecified: Secondary | ICD-10-CM

## 2018-04-20 DIAGNOSIS — I359 Nonrheumatic aortic valve disorder, unspecified: Secondary | ICD-10-CM

## 2018-04-20 DIAGNOSIS — I2510 Atherosclerotic heart disease of native coronary artery without angina pectoris: Secondary | ICD-10-CM

## 2018-04-20 DIAGNOSIS — I248 Other forms of acute ischemic heart disease: Secondary | ICD-10-CM | POA: Diagnosis present

## 2018-04-20 LAB — GLUCOSE, CAPILLARY
GLUCOSE-CAPILLARY: 119 mg/dL — AB (ref 70–99)
Glucose-Capillary: 111 mg/dL — ABNORMAL HIGH (ref 70–99)
Glucose-Capillary: 118 mg/dL — ABNORMAL HIGH (ref 70–99)
Glucose-Capillary: 167 mg/dL — ABNORMAL HIGH (ref 70–99)

## 2018-04-20 LAB — BASIC METABOLIC PANEL
Anion gap: 9 (ref 5–15)
BUN: 18 mg/dL (ref 8–23)
CO2: 21 mmol/L — ABNORMAL LOW (ref 22–32)
Calcium: 7.8 mg/dL — ABNORMAL LOW (ref 8.9–10.3)
Chloride: 106 mmol/L (ref 98–111)
Creatinine, Ser: 1.14 mg/dL (ref 0.61–1.24)
GFR calc Af Amer: >60 mL/min (ref >60)
GFR, EST NON AFRICAN AMERICAN: 59 mL/min — AB (ref >60)
Glucose, Bld: 116 mg/dL — ABNORMAL HIGH (ref 70–99)
Potassium: 3.3 mmol/L — ABNORMAL LOW (ref 3.5–5.1)
SODIUM: 136 mmol/L (ref 135–145)

## 2018-04-20 LAB — HEPARIN LEVEL (UNFRACTIONATED)
Heparin Unfractionated: 0.23 IU/mL — ABNORMAL LOW (ref 0.30–0.70)
Heparin Unfractionated: 0.14 IU/mL — ABNORMAL LOW (ref 0.30–0.70)
Heparin Unfractionated: 0.42 IU/mL (ref 0.30–0.70)

## 2018-04-20 LAB — CBC
HCT: 27.9 % — ABNORMAL LOW (ref 39.0–52.0)
Hemoglobin: 9 g/dL — ABNORMAL LOW (ref 13.0–17.0)
MCH: 28.1 pg (ref 26.0–34.0)
MCHC: 32.3 g/dL (ref 30.0–36.0)
MCV: 87.2 fL (ref 80.0–100.0)
Platelets: 142 10*3/uL — ABNORMAL LOW (ref 150–400)
RBC: 3.2 MIL/uL — ABNORMAL LOW (ref 4.22–5.81)
RDW: 15.1 % (ref 11.5–15.5)
WBC: 9.3 10*3/uL (ref 4.0–10.5)
nRBC: 0 % (ref 0.0–0.2)

## 2018-04-20 LAB — URINE CULTURE: Culture: NO GROWTH

## 2018-04-20 LAB — ECHOCARDIOGRAM COMPLETE
Height: 67 in
Weight: 2800.72 oz

## 2018-04-20 MED ORDER — NITROGLYCERIN 0.4 MG SL SUBL: 0.4000 mg | SUBLINGUAL_TABLET | SUBLINGUAL | Status: DC | PRN

## 2018-04-20 MED ORDER — METOPROLOL SUCCINATE ER 25 MG PO TB24
12.5000 mg | ORAL_TABLET | Freq: Every day | ORAL | Status: DC
  Administered 2018-04-22: 12.5 mg via ORAL
  Filled 2018-04-20: qty 1

## 2018-04-20 MED ORDER — ASPIRIN EC 81 MG PO TBEC
81.0000 mg | DELAYED_RELEASE_TABLET | ORAL | Status: DC
  Administered 2018-04-20 – 2018-04-22 (×3): 81 mg via ORAL
  Filled 2018-04-20 (×3): qty 1

## 2018-04-20 MED ORDER — POLYETHYLENE GLYCOL 3350 17 G PO PACK
17.0000 g | PACK | Freq: Every day | ORAL | Status: DC
  Administered 2018-04-20 – 2018-04-22 (×2): 17 g via ORAL
  Filled 2018-04-20 (×4): qty 1

## 2018-04-20 MED ORDER — RISPERIDONE 0.5 MG PO TABS
0.2500 mg | ORAL_TABLET | Freq: Three times a day (TID) | ORAL | Status: DC
  Administered 2018-04-20 – 2018-04-22 (×7): 0.25 mg via ORAL
  Filled 2018-04-20 (×7): qty 1

## 2018-04-20 MED ORDER — POTASSIUM CHLORIDE CRYS ER 20 MEQ PO TBCR
40.0000 meq | EXTENDED_RELEASE_TABLET | Freq: Once | ORAL | Status: AC
  Administered 2018-04-20: 40 meq via ORAL
  Filled 2018-04-20: qty 2

## 2018-04-20 MED ORDER — FUROSEMIDE 20 MG PO TABS
20.0000 mg | ORAL_TABLET | Freq: Every day | ORAL | Status: DC
  Administered 2018-04-20 – 2018-04-22 (×3): 20 mg via ORAL
  Filled 2018-04-20 (×3): qty 1

## 2018-04-20 NOTE — Progress Notes (Signed)
PROGRESS NOTE                                                                                                                                                                                                             Patient Demographics:    [name], is a 85 y.o. male, DOB - 06-03-1932, TMH:[id number]  Admit date - 04/18/2018   Admitting Physician Bethena Roys, MD  Outpatient Primary MD for the patient is Chipper Herb, MD  LOS - 2  Chief Complaint  Patient presents with  . Altered Mental Status       Brief Narrative   [name] is a 85 y.o. male with medical history significant for HTN, AV replacement, BPH, CAD, DM2, CVA, who was brought to the [facility] ED with complaints of confusion and fevers.  Patient was discharged from the hospital yesterday- 1/23 - 1/24, admitted for similar complaints of fevers, ? confusion as the patient has baseline dementia.  Patient had had a transfusion the day before admission 1/22, fevers were attributed to transfusion reaction as focus of infection was not identified.   Through blood cultures blood cultures grew staph epidermidis in 3 out of 4 bottles, he was started on antibiotics, later on his troponin trended up and was transferred to [facility] for further care.   Subjective:    Patient in bed, appears comfortable, denies any headache, no fever, no chest pain or pressure, no shortness of breath , no abdominal pain. No focal weakness.    Assessment  & Plan :     1.  Sepsis with coag negative staph bacteremia.  Has history of AVR, currently on broad-spectrum antibiotics will taper down to vancomycin only at this time.  No cough or shortness of breath, no skin wounds.  Discussed with ID physician, patient currently on cefazolin and rifampin combination, TEE has been ordered and scheduled for 04/21/2018.  Initially had toxic encephalopathy which has completely resolved.  Surveillance cultures so far are  negative.  2.  Non-STEMI in a patient with AVR and CAD with CABG in 2012  history.  No chest pain, troponin seems to have peaked, heparin drip for a total 48 hrs, stop 04/21/18, continue statin.  Blood pressure too soft for beta-blocker.  Cardiology on board.  3.  Hypokalemia.  replaced will recheck again tomorrow.  4.  HX of iron deficiency anemia.  Received transfusion on last admission, currently stable placed on PPI will monitor, outpatient age-appropriate work-up.  5.  Severe dementia and depression.  Supportive care at risk for delirium family explained.  6.  DM type II.  Recent A1c was 6.5.  Hold metformin SSI.  CBG (last 3)  Recent Labs    04/19/18 2327 04/20/18 0501 04/20/18 0906  GLUCAP 106* 111* 119*     Family Communication  :  Daughter  Code Status :  Full  Disposition Plan  :    Consults  : ID, Cardiology  Procedures  :    TEE -   DVT Prophylaxis  :  Heparin gtt  Lab Results  Component Value Date   PLT 142 (L) 04/20/2018    Diet :  Diet Order            Diet Heart Room service appropriate? Yes; Fluid consistency: Thin  Diet effective now               Inpatient Medications Scheduled Meds: . Chlorhexidine Gluconate Cloth  6 each Topical Q0600  . insulin aspart  0-9 Units Subcutaneous Q6H  . pantoprazole  40 mg Oral Daily  . rosuvastatin  40 mg Oral q1800   Continuous Infusions: .  ceFAZolin (ANCEF) IV 2 g (04/20/18 0646)  . heparin 1,350 Units/hr (04/20/18 0725)  . rifampin (RIFADIN) IVPB 300 mg (04/20/18 0456)   PRN Meds:.bisacodyl, ondansetron **OR** ondansetron (ZOFRAN) IV  Antibiotics  :   Anti-infectives (From admission, onward)   Start     Dose/Rate Route Frequency Ordered Stop   04/19/18 1733  metroNIDAZOLE (FLAGYL) IVPB 500 mg  Status:  Discontinued     500 mg 100 mL/hr over 60 Minutes Intravenous Every 12 hours 04/19/18 0843 04/19/18 0856   04/19/18 1600  rifampin (RIFADIN) 300 mg in sodium chloride 0.9 % 100 mL IVPB     300  mg 200 mL/hr over 30 Minutes Intravenous Every 8 hours 04/19/18 1456     04/19/18 1530  ceFAZolin (ANCEF) IVPB 2g/100 mL premix     2 g 200 mL/hr over 30 Minutes Intravenous Every 8 hours 04/19/18 1456     04/19/18 1330  metroNIDAZOLE (FLAGYL) IVPB 500 mg  Status:  Discontinued     500 mg 100 mL/hr over 60 Minutes Intravenous Every 8 hours 04/19/18 0856 04/19/18 1452   04/19/18 1200  vancomycin (VANCOCIN) IVPB 1000 mg/200 mL premix  Status:  Discontinued     1,000 mg 200 mL/hr over 60 Minutes Intravenous Every 24 hours 04/18/18 1252 04/19/18 1452   04/18/18 2100  ceFEPIme (MAXIPIME) 2 g in sodium chloride 0.9 % 100 mL IVPB  Status:  Discontinued     2 g 200 mL/hr over 30 Minutes Intravenous Every 12 hours 04/18/18 1252 04/18/18 1432   04/18/18 2100  ceFEPIme (MAXIPIME) 2 g in sodium chloride 0.9 % 100 mL IVPB  Status:  Discontinued     2 g 200 mL/hr over 30 Minutes Intravenous Every 24 hours 04/18/18 1432 04/19/18 1452   04/18/18 1245  ceFEPIme (MAXIPIME) 2 g in sodium chloride 0.9 % 100 mL IVPB     2 g 200 mL/hr over 30 Minutes Intravenous  Once 04/18/18 1240 04/18/18 1341   04/18/18 1245  metroNIDAZOLE (FLAGYL) IVPB 500 mg  Status:  Discontinued     500 mg 100 mL/hr over 60 Minutes Intravenous Every 8 hours 04/18/18 1240 04/19/18 0843   04/18/18 1245  vancomycin (VANCOCIN) IVPB 1000 mg/200 mL premix  Status:  Discontinued     1,000 mg 200 mL/hr over 60 Minutes Intravenous  Once 04/18/18 1240 04/18/18 1244   04/18/18 1245  vancomycin (VANCOCIN) 1,500 mg in sodium chloride 0.9 % 500 mL IVPB     1,500 mg 250 mL/hr over 120 Minutes Intravenous  Once 04/18/18 1244 04/18/18 1910          Objective:   Vitals:   04/19/18 1300 04/19/18 2025 04/19/18 2046 04/20/18 0447  BP:   112/70 101/73  Pulse: 84  90 87  Resp: (!) 22  (!) 25 (!) 25  Temp:  98.3 F (36.8 C)  97.9 F (36.6 C)  TempSrc:    Oral  SpO2: 98%  98% 97%  Weight:      Height:        Wt Readings from Last 3  Encounters:  04/19/18 79.4 kg  04/16/18 77.5 kg  04/10/18 77.6 kg     Intake/Output Summary (Last 24 hours) at 04/20/2018 1041 Last data filed at 04/20/2018 8119 Gross per 24 hour  Intake 804.18 ml  Output 850 ml  Net -45.82 ml     Physical Exam  Awake Alert, Oriented X 3, No new F.N deficits, Normal affect [place].AT,PERRAL Supple Neck,No JVD, No cervical lymphadenopathy appriciated.  Symmetrical Chest wall movement, Good air movement bilaterally, CTAB RRR,No Gallops,Rubs or new Murmurs, No Parasternal Heave +ve B.Sounds, Abd Soft, No tenderness, No organomegaly appriciated, No rebound - guarding or rigidity. No Cyanosis, Clubbing or edema, No new Rash or bruise       Data Review:    CBC  Recent Labs  Lab 04/16/18 1837 04/17/18 0553 04/18/18 1219 04/19/18 0742 04/20/18 0633  WBC 11.5* 8.2 8.3 8.5 9.3  HGB 9.3* 7.5* 9.6* 9.2* 9.0*  HCT 28.9* 23.7* 29.9* 27.8* 27.9*  PLT 190 146* 149* 138* 142*  MCV 90.3 90.8 88.2 88.0 87.2  MCH 29.1 28.7 28.3 29.1 28.1  MCHC 32.2 31.6 32.1 33.1 32.3  RDW 14.5 14.7 15.2 15.2 15.1  LYMPHSABS 0.8  --  0.6*  --   --   MONOABS 1.0  --  0.6  --   --   EOSABS 0.0  --  0.0  --   --   BASOSABS 0.0  --  0.0  --   --     Chemistries  Recent Labs  Lab 04/16/18 1837 04/17/18 0553 04/18/18 1219 04/18/18 1551 04/19/18 0742 04/20/18 0633  NA 133* 138 138  --  140 136  K 3.7 3.4* 3.0*  --  3.3* 3.3*  CL 100 109 109  --  111 106  CO2 24 23 20*  --  21* 21*  GLUCOSE 200* 118* 174*  --  109* 116*  BUN 21 18 21   --  21 18  CREATININE 1.14 0.99 1.39*  --  1.23 1.14  CALCIUM 8.9 8.0* 8.1*  --  8.1* 7.8*  MG  --   --   --  1.6* 2.1  --   AST 26  --  38  --  42*  --   ALT 20  --  20  --  19  --   ALKPHOS 80  --  70  --  55  --   BILITOT 0.9  --  1.2  --  0.9  --    ------------------------------------------------------------------------------------------------------------------ Recent Labs    04/19/18 0205  CHOL 69  HDL 16*  LDLCALC  39  TRIG 70  CHOLHDL 4.3    Lab Results  Component Value Date   HGBA1C 5.5 04/19/2018   ------------------------------------------------------------------------------------------------------------------ No results for input(s):  TSH, T4TOTAL, T3FREE, THYROIDAB in the last 72 hours.  Invalid input(s): FREET3 ------------------------------------------------------------------------------------------------------------------ Recent Labs    04/19/18 1052  VITAMINB12 229  FOLATE 19.7  FERRITIN 349*  TIBC 192*  IRON 82  RETICCTPCT 1.3    Coagulation profile  Recent Labs  Lab 04/18/18 1219  INR 1.14    No results for input(s): DDIMER in the last 72 hours.  Cardiac Enzymes Recent Labs  Lab 04/18/18 1551 04/18/18 1859 04/18/18 2201  TROPONINI 4.48* 4.01* 3.59*   ------------------------------------------------------------------------------------------------------------------    Component Value Date/Time   BNP 553.6 (H) 04/19/2018 0205    Micro Results Recent Results (from the past 240 hour(s))  Blood Culture (routine x 2)     Status: Abnormal   Collection Time: 04/16/18  6:30 PM  Result Value Ref Range Status   Specimen Description   Final    BLOOD LEFT WRIST Performed at [facility], [address] [place], [place] [address]    Special Requests   Final    BOTTLES DRAWN AEROBIC AND ANAEROBIC Blood Culture adequate volume Performed at [facility], [address], [place], [place] [address]    Culture  Setup Time   Final    GRAM POSITIVE COCCI AEROBIC , ANAEROBIC Gram Stain Report Called to,Read Back By and Verified With: KEMP @ [id number]  [id number] BY HENDERSON L. CRITICAL RESULT CALLED TO, READ BACK BY AND VERIFIED WITH: C KEMP 04/17/18 1728 JDW    Culture (A)  Final    STAPHYLOCOCCUS EPIDERMIDIS SUSCEPTIBILITIES PERFORMED ON PREVIOUS CULTURE WITHIN THE LAST 5 DAYS. Performed at [facility], [place] [address] [place], [place] [address]    Report Status  04/19/2018 FINAL  Final  Blood Culture (routine x 2)     Status: Abnormal   Collection Time: 04/16/18  6:38 PM  Result Value Ref Range Status   Specimen Description   Final    LEFT ANTECUBITAL Performed at [facility], [address] [place], [place] [address]    Special Requests   Final    BOTTLES DRAWN AEROBIC AND ANAEROBIC Blood Culture adequate volume Performed at [facility], [address] [place], [place] [address]    Culture  Setup Time   Final    GRAM POSITIVE COCCI ANAEROBIC Gram Stain Report Called to,Read Back By and Verified With: CARDWELL L. @1401  ON [id number] BY HENDERSON L. GRAM POSITIVE COCCI AEROBIC RESULT PREV. CALLED CRITICAL RESULT CALLED TO, READ BACK BY AND VERIFIED WITH: C KEMP RN 04/17/18 1728 JDW Performed at [facility], [place] [address] [place], [place] [address]    Culture STAPHYLOCOCCUS EPIDERMIDIS (A)  Final   Report Status 04/19/2018 FINAL  Final   Organism ID, Bacteria STAPHYLOCOCCUS EPIDERMIDIS  Final      Susceptibility   Staphylococcus epidermidis - MIC*    CIPROFLOXACIN <=0.5 SENSITIVE Sensitive     ERYTHROMYCIN >=8 RESISTANT Resistant     GENTAMICIN <=0.5 SENSITIVE Sensitive     OXACILLIN <=0.25 SENSITIVE Sensitive     TETRACYCLINE <=1 SENSITIVE Sensitive     VANCOMYCIN 1 SENSITIVE Sensitive     TRIMETH/SULFA <=10 SENSITIVE Sensitive     CLINDAMYCIN <=0.25 SENSITIVE Sensitive     RIFAMPIN <=0.5 SENSITIVE Sensitive     Inducible Clindamycin NEGATIVE Sensitive     * STAPHYLOCOCCUS EPIDERMIDIS  Blood Culture ID Panel (Reflexed)     Status: Abnormal   Collection Time: 04/16/18  6:38 PM  Result Value Ref Range Status   Enterococcus species NOT DETECTED NOT DETECTED  Final   Listeria monocytogenes NOT DETECTED NOT DETECTED Final   Staphylococcus species DETECTED (A) NOT DETECTED Final    Comment: Methicillin (oxacillin) susceptible coagulase negative staphylococcus. Possible blood culture contaminant (unless isolated from more than one  blood culture draw or clinical case suggests pathogenicity). No antibiotic treatment is indicated for blood  culture contaminants. CRITICAL RESULT CALLED TO, READ BACK BY AND VERIFIED WITH: C KEMP RN ([facility]) 04/17/18 1728 JDW    Staphylococcus aureus (BCID) NOT DETECTED NOT DETECTED Final   Methicillin resistance NOT DETECTED NOT DETECTED Final   Streptococcus species NOT DETECTED NOT DETECTED Final   Streptococcus agalactiae NOT DETECTED NOT DETECTED Final   Streptococcus pneumoniae NOT DETECTED NOT DETECTED Final   Streptococcus pyogenes NOT DETECTED NOT DETECTED Final   Acinetobacter baumannii NOT DETECTED NOT DETECTED Final   Enterobacteriaceae species NOT DETECTED NOT DETECTED Final   Enterobacter cloacae complex NOT DETECTED NOT DETECTED Final   Escherichia coli NOT DETECTED NOT DETECTED Final   Klebsiella oxytoca NOT DETECTED NOT DETECTED Final   Klebsiella pneumoniae NOT DETECTED NOT DETECTED Final   Proteus species NOT DETECTED NOT DETECTED Final   Serratia marcescens NOT DETECTED NOT DETECTED Final   Haemophilus influenzae NOT DETECTED NOT DETECTED Final   Neisseria meningitidis NOT DETECTED NOT DETECTED Final   Pseudomonas aeruginosa NOT DETECTED NOT DETECTED Final   Candida albicans NOT DETECTED NOT DETECTED Final   Candida glabrata NOT DETECTED NOT DETECTED Final   Candida krusei NOT DETECTED NOT DETECTED Final   Candida parapsilosis NOT DETECTED NOT DETECTED Final   Candida tropicalis NOT DETECTED NOT DETECTED Final    Comment: Performed at [facility], [place] [address], [place], [place] [address]  Culture, Urine     Status: None   Collection Time: 04/16/18  6:45 PM  Result Value Ref Range Status   Specimen Description URINE, CLEAN CATCH  Final   Special Requests   Final    NONE Performed at [facility], [address], [place], [place] [address]    Culture NO GROWTH  Final   Report Status 04/18/2018 FINAL  Final  Culture, blood (Routine x 2)     Status: None  (Preliminary result)   Collection Time: 04/18/18 12:21 PM  Result Value Ref Range Status   Specimen Description   Final    BLOOD RIGHT ARM BOTTLES DRAWN AEROBIC AND ANAEROBIC   Special Requests Blood Culture adequate volume  Final   Culture   Final    NO GROWTH 2 DAYS Performed at [facility], [address], [place], [place] [address]    Report Status PENDING  Incomplete  Culture, blood (Routine x 2)     Status: None (Preliminary result)   Collection Time: 04/18/18 12:49 PM  Result Value Ref Range Status   Specimen Description   Final    BLOOD LEFT HAND BOTTLES DRAWN AEROBIC AND ANAEROBIC   Special Requests Blood Culture adequate volume  Final   Culture   Final    NO GROWTH 2 DAYS Performed at [facility], [address], [place], [place] [address]    Report Status PENDING  Incomplete  Culture, Urine     Status: None   Collection Time: 04/18/18  1:22 PM  Result Value Ref Range Status   Specimen Description   Final    URINE, CLEAN CATCH Performed at [facility], [address] [place], [place] [address]    Special Requests   Final    NONE Performed at [facility], [address], [place], [place] [address]    Culture   Final    NO GROWTH Performed at [facility], [place] [address] [place], [place] [address]    Report Status 04/20/2018 FINAL  Final  MRSA PCR Screening     Status: None   Collection Time: 04/19/18 12:27 AM  Result Value Ref Range Status   MRSA by PCR NEGATIVE NEGATIVE Final    Comment:        The GeneXpert MRSA Assay (FDA approved for NASAL specimens only), is one component of a comprehensive MRSA colonization surveillance program. It is not intended to diagnose MRSA infection nor to guide or monitor treatment for MRSA infections. Performed at [facility], [place] [address], [place], [place] [address]     Radiology Reports  Dg Chest 2 View  Result Date: 04/16/2018 CLINICAL DATA:  Possible sepsis EXAM: CHEST - 2 VIEW COMPARISON:   01/26/2018 FINDINGS: Post sternotomy changes. Mild cardiomegaly with aortic atherosclerosis. Linear scarring or atelectasis at the left base. Degenerative changes of the spine. No pneumothorax. IMPRESSION: Scarring at the left lung base.  Mild cardiomegaly. Electronically Signed   By: Donavan Foil M.D.   On: 04/16/2018 19:31   Ct Head Wo Contrast  Result Date: 04/18/2018 CLINICAL DATA:  Altered mental status beginning 0800 hours today. Fever. EXAM: CT HEAD WITHOUT CONTRAST TECHNIQUE: Contiguous axial images were obtained from the base of the skull through the vertex without intravenous contrast. COMPARISON:  MRI 01/26/2018 FINDINGS: Brain: Chronic brain atrophy. Chronic small-vessel ischemic changes of the cerebral hemispheric white matter. Chronic ventriculomegaly presumed secondary to central atrophy. No sign of acute infarction, mass lesion, change in ventricular size or extra-axial collection. Vascular: There is atherosclerotic calcification of the major vessels at the base of the brain. Skull: Negative Sinuses/Orbits: Clear/normal Other: None IMPRESSION: No acute finding by CT. Atrophy and chronic small-vessel ischemic changes. Chronic ventriculomegaly felt consistent with central atrophy. Electronically Signed   By: Nelson Chimes M.D.   On: 04/18/2018 16:24   Dg Chest Port 1 View  Result Date: 04/18/2018 CLINICAL DATA:  Rales at right lung base. EXAM: PORTABLE CHEST 1 VIEW COMPARISON:  04/16/2018 FINDINGS: Mild cardiac enlargement and aortic atherosclerosis. There is diffuse pulmonary vascular congestion there is asymmetric opacification of the left midlung and left base. IMPRESSION: 1. Pulmonary vascular congestion with asymmetric increased opacification of the left midlung and left base. Electronically Signed   By: Kerby Moors M.D.   On: 04/18/2018 13:18    Time Spent in minutes  30   Lala Lund M.D on 04/20/2018 at 10:41 AM  To page go to [email or web address] - password [facility]

## 2018-04-20 NOTE — Telephone Encounter (Signed)
Post ED Visit - Positive Culture Follow-up  Culture report reviewed by antimicrobial stewardship pharmacist:  []  Elenor Quinones, Pharm.D. []  Heide Guile, Pharm.D., BCPS AQ-ID []  Parks Neptune, Pharm.D., BCPS []  Alycia Rossetti, Pharm.D., BCPS []  [place], Pharm.D., BCPS, AAHIVP []  Legrand Como, Pharm.D., BCPS, AAHIVP [x]  Salome Arnt, PharmD, BCPS []  Johnnette Gourd, PharmD, BCPS []  Hughes Better, PharmD, BCPS []  Leeroy Cha, PharmD  Positive blood culture Treated with cefazolin, admitted to 5W, no further patient follow-up is required at this time.  Hazle Nordmann 04/20/2018, 8:49 AM

## 2018-04-20 NOTE — Progress Notes (Signed)
ANTICOAGULATION CONSULT NOTE - Follow Up Consult  Pharmacy Consult for Heparin Indication: chest pain/ACS  No Known Allergies  Patient Measurements: Height: 5\' 7"  (170.2 cm) Weight: 175 lb 0.7 oz (79.4 kg) IBW/kg (Calculated) : 66.1 Heparin Dosing Weight:    Vital Signs: Temp: 98.3 F (36.8 C) (01/27 1500) Temp Source: Oral (01/27 1500) BP: 119/82 (01/27 1500) Pulse Rate: 81 (01/27 1500)  Labs: Recent Labs    04/18/18 1219 04/18/18 1551 04/18/18 1859 04/18/18 2201 04/19/18 0742  04/20/18 0633 04/20/18 1155 04/20/18 1854  HGB 9.6*  --   --   --  9.2*  --  9.0*  --   --   HCT 29.9*  --   --   --  27.8*  --  27.9*  --   --   PLT 149*  --   --   --  138*  --  142*  --   --   LABPROT 14.5  --   --   --   --   --   --   --   --   INR 1.14  --   --   --   --   --   --   --   --   HEPARINUNFRC  --   --   --   --   --    < > 0.23* 0.14* 0.42  CREATININE 1.39*  --   --   --  1.23  --  1.14  --   --   TROPONINI  --  4.48* 4.01* 3.59*  --   --   --   --   --    < > = values in this interval not displayed.    Estimated Creatinine Clearance: 49.6 mL/min (by C-G formula based on SCr of 1.14 mg/dL).  Assessment: [name] a 85 y.o.maleon IV heparin for ACS / elevated troponin. Goal 0.3-0.5 because of anemia.   Heparin level this evening is therapeutic. No s/s of bleeding per RN    Goal of Therapy:  Heparin level 0.3-0.5 Monitor platelets by anticoagulation protocol: Yes   Plan:  -Continue Heparin infusion at 1550 units/hr -Check HL and CBC in AM -Per cards, iv heparin to be stopped on 1/28   Albertina Parr, PharmD., BCPS Clinical Pharmacist Clinical phone for 04/20/18 until 10:30pm: [phone] If after 10:30pm, please refer to [facility] for unit-specific pharmacist

## 2018-04-20 NOTE — Progress Notes (Signed)
[facility] Infusion Coordinator will follow pt with ID team to support home infusion pharmacy services at [facility] for home IV ABX.  If patient discharges after hours, please call [phone].   Larry Sierras 04/20/2018, 8:09 AM

## 2018-04-20 NOTE — Progress Notes (Signed)
ANTICOAGULATION CONSULT NOTE - Follow Up Consult  Pharmacy Consult for Heparin Indication: chest pain/ACS  No Known Allergies  Patient Measurements: Height: 5\' 7"  (170.2 cm) Weight: 175 lb 0.7 oz (79.4 kg) IBW/kg (Calculated) : 66.1 Heparin Dosing Weight:    Vital Signs: Temp: 97.9 F (36.6 C) (01/27 0447) Temp Source: Oral (01/27 0447) BP: 101/73 (01/27 0447) Pulse Rate: 87 (01/27 0447)  Labs: Recent Labs    04/18/18 1219 04/18/18 1551 04/18/18 1859 04/18/18 2201 04/19/18 0742 04/19/18 2034 04/20/18 0633 04/20/18 1155  HGB 9.6*  --   --   --  9.2*  --  9.0*  --   HCT 29.9*  --   --   --  27.8*  --  27.9*  --   PLT 149*  --   --   --  138*  --  142*  --   LABPROT 14.5  --   --   --   --   --   --   --   INR 1.14  --   --   --   --   --   --   --   HEPARINUNFRC  --   --   --   --   --  <0.10* 0.23* 0.14*  CREATININE 1.39*  --   --   --  1.23  --  1.14  --   TROPONINI  --  4.48* 4.01* 3.59*  --   --   --   --     Estimated Creatinine Clearance: 49.6 mL/min (by C-G formula based on SCr of 1.14 mg/dL).  Assessment: Anticoag: lovenox off; heparin gtt for NSTEMI. Goal 0.3-0.5 because of anemia. HL 0.23 down to 0.14. Hgb 9, plts 142 stable. RN reports not problems with infusion.   Goal of Therapy:  Heparin level 0.3-0.5 Monitor platelets by anticoagulation protocol: Yes   Plan:  - Increase Heparin infusion 1550 units/hr-(stop 1/28 per cards note.)- Check heparin level in 6 hrs. - Daily HL and CBC  Burhanuddin Kohlmann S. Alford Highland, PharmD, BCPS Clinical Staff Pharmacist Eilene Ghazi Stillinger 04/20/2018,1:29 PM

## 2018-04-20 NOTE — Progress Notes (Addendum)
Progress Note  Patient Name: CASTEN FLOREN Date of Encounter: 04/20/2018  Primary Cardiologist: Minus Breeding, MD   Subjective   Pt feeling well today. Up to chair this AM. Denies chest pain. TEE scheduled for tomorrow   Inpatient Medications    Scheduled Meds: . Chlorhexidine Gluconate Cloth  6 each Topical Q0600  . insulin aspart  0-9 Units Subcutaneous Q6H  . pantoprazole  40 mg Oral Daily  . rosuvastatin  40 mg Oral q1800   Continuous Infusions: .  ceFAZolin (ANCEF) IV 2 g (04/20/18 0646)  . heparin 1,350 Units/hr (04/20/18 0725)  . rifampin (RIFADIN) IVPB 300 mg (04/20/18 0456)   PRN Meds: bisacodyl, ondansetron **OR** ondansetron (ZOFRAN) IV   Vital Signs    Vitals:   04/19/18 1300 04/19/18 2025 04/19/18 2046 04/20/18 0447  BP:   112/70 101/73  Pulse: 84  90 87  Resp: (!) 22  (!) 25 (!) 25  Temp:  98.3 F (36.8 C)  97.9 F (36.6 C)  TempSrc:    Oral  SpO2: 98%  98% 97%  Weight:      Height:        Intake/Output Summary (Last 24 hours) at 04/20/2018 1041 Last data filed at 04/20/2018 8676 Gross per 24 hour  Intake 804.18 ml  Output 850 ml  Net -45.82 ml   Filed Weights   04/18/18 1214 04/19/18 0019  Weight: 77.5 kg 79.4 kg    Physical Exam   General: Well developed, well nourished, NAD Skin: Warm, dry, intact  Head: Normocephalic, atraumatic, clear, moist mucus membranes. Neck: Negative for carotid bruits. No JVD Lungs:Clear to ausculation bilaterally. No wheezes, rales, or rhonchi. Breathing is unlabored. Cardiovascular: RRR with S1 S2. No murmurs, rubs, gallops, or LV heave appreciated. Abdomen: Soft, non-tender, non-distended with normoactive bowel sounds. No obvious abdominal masses. MSK: Strength and tone appear normal for age. 5/5 in all extremities Extremities: No edema. No clubbing or cyanosis. DP/PT pulses 2+ bilaterally Neuro: Alert and oriented. No focal deficits. No facial asymmetry. MAE spontaneously. Psych: Responds to  questions appropriately with normal affect.    Labs    Chemistry Recent Labs  Lab 04/16/18 1837  04/18/18 1219 04/19/18 0742 04/20/18 0633  NA 133*   < > 138 140 136  K 3.7   < > 3.0* 3.3* 3.3*  CL 100   < > 109 111 106  CO2 24   < > 20* 21* 21*  GLUCOSE 200*   < > 174* 109* 116*  BUN 21   < > 21 21 18   CREATININE 1.14   < > 1.39* 1.23 1.14  CALCIUM 8.9   < > 8.1* 8.1* 7.8*  PROT 6.5  --  5.8* 5.0*  --   ALBUMIN 3.3*  --  2.7* 2.4*  --   AST 26  --  38 42*  --   ALT 20  --  20 19  --   ALKPHOS 80  --  70 55  --   BILITOT 0.9  --  1.2 0.9  --   GFRNONAA 59*   < > 47* 54* 59*  GFRAA >60   < > 54* >60 >60  ANIONGAP 9   < > 9 8 9    < > = values in this interval not displayed.     Hematology Recent Labs  Lab 04/18/18 1219 04/19/18 0742 04/19/18 1052 04/20/18 0633  WBC 8.3 8.5  --  9.3  RBC 3.39* 3.16* 3.16* 3.20*  HGB 9.6* 9.2*  --  9.0*  HCT 29.9* 27.8*  --  27.9*  MCV 88.2 88.0  --  87.2  MCH 28.3 29.1  --  28.1  MCHC 32.1 33.1  --  32.3  RDW 15.2 15.2  --  15.1  PLT 149* 138*  --  142*    Cardiac Enzymes Recent Labs  Lab 04/18/18 1551 04/18/18 1859 04/18/18 2201  TROPONINI 4.48* 4.01* 3.59*   No results for input(s): TROPIPOC in the last 168 hours.   BNP Recent Labs  Lab 04/19/18 0205  BNP 553.6*     DDimer No results for input(s): DDIMER in the last 168 hours.   Radiology    Ct Head Wo Contrast  Result Date: 04/18/2018 CLINICAL DATA:  Altered mental status beginning 0800 hours today. Fever. EXAM: CT HEAD WITHOUT CONTRAST TECHNIQUE: Contiguous axial images were obtained from the base of the skull through the vertex without intravenous contrast. COMPARISON:  MRI 01/26/2018 FINDINGS: Brain: Chronic brain atrophy. Chronic small-vessel ischemic changes of the cerebral hemispheric white matter. Chronic ventriculomegaly presumed secondary to central atrophy. No sign of acute infarction, mass lesion, change in ventricular size or extra-axial collection.  Vascular: There is atherosclerotic calcification of the major vessels at the base of the brain. Skull: Negative Sinuses/Orbits: Clear/normal Other: None IMPRESSION: No acute finding by CT. Atrophy and chronic small-vessel ischemic changes. Chronic ventriculomegaly felt consistent with central atrophy. Electronically Signed   By: Nelson Chimes M.D.   On: 04/18/2018 16:24   Dg Chest Port 1 View  Result Date: 04/18/2018 CLINICAL DATA:  Rales at right lung base. EXAM: PORTABLE CHEST 1 VIEW COMPARISON:  04/16/2018 FINDINGS: Mild cardiac enlargement and aortic atherosclerosis. There is diffuse pulmonary vascular congestion there is asymmetric opacification of the left midlung and left base. IMPRESSION: 1. Pulmonary vascular congestion with asymmetric increased opacification of the left midlung and left base. Electronically Signed   By: Kerby Moors M.D.   On: 04/18/2018 13:18   Telemetry    04/20/2018 NSR HR 70- Personally Reviewed  ECG    No acute changes as of 04/20/2018  - Personally Reviewed  Cardiac Studies   Echocardiogram 01/2018: - Left ventricle: The cavity size was normal. Wall thickness was increased in a pattern of mild LVH. Systolic function was vigorous. The estimated ejection fraction was in the range of 65% to 70%. Wall motion was normal; there were no regional wall motion abnormalities. Doppler parameters are consistent with abnormal left ventricular relaxation (grade 1 diastolic dysfunction). - Aortic valve: A 23 mm [facility] Ease pericardial prosthesis is in the aortic position with grossly normal function based on gradients. There was no significant regurgitation. Mean gradient (S): 18 mm Hg. Peak gradient (S): 35 mm Hg. Valve area (VTI): 1.47 cm^2. - Mitral valve: Mildly to moderately calcified annulus. There was trivial regurgitation. - Left atrium: The atrium was moderately dilated. - Right atrium: Central venous pressure (est): 3 mm Hg. -  Atrial septum: No defect or patent foramen ovale was identified. - Tricuspid valve: There was trivial regurgitation. - Pulmonary arteries: Systolic pressure could not be accurately estimated. - Pericardium, extracardiac: There was no pericardial effusion  Patient Profile     85 y.o. male with a hx of CAD, AS s/p CABG-AVR in 2012, HTN, HLD, DM2, dementia, who is being seen today for the evaluation of elevated troponin at the request of Dr. [place] Brick.  Assessment & Plan    1. Elevated troponin with new ECG changes- concerning for Demand  Ischemia (Myocardial injury due to Bacteremia/Sepsis) vs. ? late presenting NSTEMI with prior hx of CABG (2012): -Pt presented with fever and confusion one day after discharge for similar presentation, found to have elevated troponin level at 4.8>>trended to 3.6 -Initial EKG with large Q waves with ST elevations, however per chart review, after repeat EKG on arrival to [facility], appears to be more consistent with prior tracing suggesting lead placement rather than recent MI with recommendations to continue intermittent EKG to follow ; prefer to consider Demand Ischemia over MI. -Denies chest pain  -Trop, 4.48>4.01>3.59 -Continue statin,   -Hep gtt>>>to be stopped 04/21/2018 -Will likely need further ischemic workup however currently limited by acute infection -- can assess in OP setting once infection cleared.  -Continue to follow for timing   2. Sepsis with negative staph bacteremia with hx of AVR: -Prior hx of AVR, currently on broad spectrum abx per primary team  -Plan for TEE for further evaluation tomorrow to access prior AVR, 04/21/2018 -ID following>>>to have PICC placed for IV abx infusion   3. HTN: -Low on presentation>>attributed to sepsis>>101/73>112/70>105/65 -Antihypertensives currently on hold secondary to soft BP -Echo with pending results>>TEE tomorrow   4. HLD: -Continue statin   5. Anemia: -Hb, 9.0 today>>prior transfusions>>on PPI    6. Hypokalmeia: -K+, 3.3 today>>>replaced per primary team    Signed, Kathyrn Drown NP-C [facility] Pager: [phone] 04/20/2018, 10:41 AM     ATTENDING ATTESTATION  I have seen, examined and evaluated the patient this PM along with Kathyrn Drown, NP-C.  After reviewing all the available data and chart, we discussed the patients laboratory, study & physical findings as well as symptoms in detail. I agree with her findings, examination as well as impression recommendations as per our discussion.    Attending adjustments noted in italics.   Demand Ischemia vs. ? Silent MI (has known CAD) in setting of MV CAD-CABG.  -- with ongoing infection will need to delay ischemic evaluation until more stable. --> relatively normal LV Fnx on Echo with no RWMA - would argue against significant MI.  Prior AVR with bacteremia - agree with TEE.  Will follow    Glenetta Hew, M.D., M.S. Interventional Cardiologist   Pager # [phone] Phone # [phone] [address] [place], [place] [address] ]  For questions or updates, please contact   Please consult [email or web address] for contact info under Cardiology/STEMI.

## 2018-04-20 NOTE — Progress Notes (Signed)
[facility]   Reason for visit: Follow up on bacteremia  Interval History: repeat blood cultures ngtd; no fever, WBC wnl; no associated fever or diarrhea.  No complaints other than wants to go home    Physical Exam: Constitutional:  Vitals:   04/19/18 2046 04/20/18 0447  BP: 112/70 101/73  Pulse: 90 87  Resp: (!) 25 (!) 25  Temp:  97.9 F (36.6 C)  SpO2: 98% 97%   patient appears in NAD Eyes: anicteric HENT: no thrush Respiratory: Normal respiratory effort; CTA B Cardiovascular: RRR GI: soft, nt, nd  Review of Systems: Constitutional: negative for fevers and chills Gastrointestinal: negative for nausea and vomiting Musculoskeletal: negative for myalgias and arthralgias  Lab Results  Component Value Date   WBC 9.3 04/20/2018   HGB 9.0 (L) 04/20/2018   HCT 27.9 (L) 04/20/2018   MCV 87.2 04/20/2018   PLT 142 (L) 04/20/2018    Lab Results  Component Value Date   CREATININE 1.14 04/20/2018   BUN 18 04/20/2018   NA 136 04/20/2018   K 3.3 (L) 04/20/2018   CL 106 04/20/2018   CO2 21 (L) 04/20/2018    Lab Results  Component Value Date   ALT 19 04/19/2018   AST 42 (H) 04/19/2018   ALKPHOS 55 04/19/2018     Microbiology: Recent Results (from the past 240 hour(s))  Blood Culture (routine x 2)     Status: Abnormal   Collection Time: 04/16/18  6:30 PM  Result Value Ref Range Status   Specimen Description   Final    BLOOD LEFT WRIST Performed at [facility], [address], [place], [place] [address]    Special Requests   Final    BOTTLES DRAWN AEROBIC AND ANAEROBIC Blood Culture adequate volume Performed at [facility], [address] [place], [place] [address]    Culture  Setup Time   Final    GRAM POSITIVE COCCI AEROBIC , ANAEROBIC Gram Stain Report Called to,Read Back By and Verified With: KEMP @ [id number]  [id number] BY HENDERSON L. CRITICAL RESULT CALLED TO, READ BACK BY AND VERIFIED WITH: C KEMP 04/17/18 1728 JDW    Culture (A)   Final    STAPHYLOCOCCUS EPIDERMIDIS SUSCEPTIBILITIES PERFORMED ON PREVIOUS CULTURE WITHIN THE LAST 5 DAYS. Performed at [facility], [place] [address] [place], [place] [address]    Report Status 04/19/2018 FINAL  Final  Blood Culture (routine x 2)     Status: Abnormal   Collection Time: 04/16/18  6:38 PM  Result Value Ref Range Status   Specimen Description   Final    LEFT ANTECUBITAL Performed at [facility], [address] [place], [place] [address]    Special Requests   Final    BOTTLES DRAWN AEROBIC AND ANAEROBIC Blood Culture adequate volume Performed at [facility], [address] [place], [place] [address]    Culture  Setup Time   Final    GRAM POSITIVE COCCI ANAEROBIC Gram Stain Report Called to,Read Back By and Verified With: CARDWELL L. @1401  ON [id number] BY HENDERSON L. GRAM POSITIVE COCCI AEROBIC RESULT PREV. CALLED CRITICAL RESULT CALLED TO, READ BACK BY AND VERIFIED WITH: C KEMP RN 04/17/18 1728 JDW Performed at [facility], [place] [address], [place], [place] [address]    Culture STAPHYLOCOCCUS EPIDERMIDIS (A)  Final   Report Status 04/19/2018 FINAL  Final   Organism ID, Bacteria STAPHYLOCOCCUS EPIDERMIDIS  Final      Susceptibility  Staphylococcus epidermidis - MIC*    CIPROFLOXACIN <=0.5 SENSITIVE Sensitive     ERYTHROMYCIN >=8 RESISTANT Resistant     GENTAMICIN <=0.5 SENSITIVE Sensitive     OXACILLIN <=0.25 SENSITIVE Sensitive     TETRACYCLINE <=1 SENSITIVE Sensitive     VANCOMYCIN 1 SENSITIVE Sensitive     TRIMETH/SULFA <=10 SENSITIVE Sensitive     CLINDAMYCIN <=0.25 SENSITIVE Sensitive     RIFAMPIN <=0.5 SENSITIVE Sensitive     Inducible Clindamycin NEGATIVE Sensitive     * STAPHYLOCOCCUS EPIDERMIDIS  Blood Culture ID Panel (Reflexed)     Status: Abnormal   Collection Time: 04/16/18  6:38 PM  Result Value Ref Range Status   Enterococcus species NOT DETECTED NOT DETECTED Final   Listeria monocytogenes NOT DETECTED NOT DETECTED Final    Staphylococcus species DETECTED (A) NOT DETECTED Final    Comment: Methicillin (oxacillin) susceptible coagulase negative staphylococcus. Possible blood culture contaminant (unless isolated from more than one blood culture draw or clinical case suggests pathogenicity). No antibiotic treatment is indicated for blood  culture contaminants. CRITICAL RESULT CALLED TO, READ BACK BY AND VERIFIED WITH: C KEMP RN ([facility]) 04/17/18 1728 JDW    Staphylococcus aureus (BCID) NOT DETECTED NOT DETECTED Final   Methicillin resistance NOT DETECTED NOT DETECTED Final   Streptococcus species NOT DETECTED NOT DETECTED Final   Streptococcus agalactiae NOT DETECTED NOT DETECTED Final   Streptococcus pneumoniae NOT DETECTED NOT DETECTED Final   Streptococcus pyogenes NOT DETECTED NOT DETECTED Final   Acinetobacter baumannii NOT DETECTED NOT DETECTED Final   Enterobacteriaceae species NOT DETECTED NOT DETECTED Final   Enterobacter cloacae complex NOT DETECTED NOT DETECTED Final   Escherichia coli NOT DETECTED NOT DETECTED Final   Klebsiella oxytoca NOT DETECTED NOT DETECTED Final   Klebsiella pneumoniae NOT DETECTED NOT DETECTED Final   Proteus species NOT DETECTED NOT DETECTED Final   Serratia marcescens NOT DETECTED NOT DETECTED Final   Haemophilus influenzae NOT DETECTED NOT DETECTED Final   Neisseria meningitidis NOT DETECTED NOT DETECTED Final   Pseudomonas aeruginosa NOT DETECTED NOT DETECTED Final   Candida albicans NOT DETECTED NOT DETECTED Final   Candida glabrata NOT DETECTED NOT DETECTED Final   Candida krusei NOT DETECTED NOT DETECTED Final   Candida parapsilosis NOT DETECTED NOT DETECTED Final   Candida tropicalis NOT DETECTED NOT DETECTED Final    Comment: Performed at [facility], [place] [address], [place], [place] [address]  Culture, Urine     Status: None   Collection Time: 04/16/18  6:45 PM  Result Value Ref Range Status   Specimen Description URINE, CLEAN CATCH  Final   Special Requests    Final    NONE Performed at [facility], [address], [place], [place] [address]    Culture NO GROWTH  Final   Report Status 04/18/2018 FINAL  Final  Culture, blood (Routine x 2)     Status: None (Preliminary result)   Collection Time: 04/18/18 12:21 PM  Result Value Ref Range Status   Specimen Description   Final    BLOOD RIGHT ARM BOTTLES DRAWN AEROBIC AND ANAEROBIC   Special Requests Blood Culture adequate volume  Final   Culture   Final    NO GROWTH 2 DAYS Performed at [facility], [address], [place], [place] [address]    Report Status PENDING  Incomplete  Culture, blood (Routine x 2)     Status: None (Preliminary result)   Collection Time: 04/18/18 12:49 PM  Result Value Ref  Range Status   Specimen Description   Final    BLOOD LEFT HAND BOTTLES DRAWN AEROBIC AND ANAEROBIC   Special Requests Blood Culture adequate volume  Final   Culture   Final    NO GROWTH 2 DAYS Performed at [facility], [address], [place], [place] [address]    Report Status PENDING  Incomplete  Culture, Urine     Status: None   Collection Time: 04/18/18  1:22 PM  Result Value Ref Range Status   Specimen Description   Final    URINE, CLEAN CATCH Performed at [facility], [address] [place], [place] [address]    Special Requests   Final    NONE Performed at [facility], [address], [place], [place] [address]    Culture   Final    NO GROWTH Performed at [facility], [place] [address] [place], [place] [address]    Report Status 04/20/2018 FINAL  Final  MRSA PCR Screening     Status: None   Collection Time: 04/19/18 12:27 AM  Result Value Ref Range Status   MRSA by PCR NEGATIVE NEGATIVE Final    Comment:        The GeneXpert MRSA Assay (FDA approved for NASAL specimens only), is one component of a comprehensive MRSA colonization surveillance program. It is not intended to diagnose MRSA infection nor to guide or monitor treatment for MRSA infections. Performed at  [facility], [place] [address], [place], [place] [address]     Impression/Plan:  1. Bacteremia - repeat cultures ngtd.  TTE today.  I would do a TEE.   Will need prolonged IV antibiotics, duration depending on TEE findings Ok for picc line tomorrow if blood cultures remain negative Continue cefazolin, rifampin  2.  AVR - concern for endocarditis and work up as above.   3.  Fever - afebrile now > 48 hours

## 2018-04-20 NOTE — H&P (View-Only) (Signed)
Progress Note  Patient Name: TILAK OAKLEY Date of Encounter: 04/20/2018  Primary Cardiologist: Minus Breeding, MD   Subjective   Pt feeling well today. Up to chair this AM. Denies chest pain. TEE scheduled for tomorrow   Inpatient Medications    Scheduled Meds: . Chlorhexidine Gluconate Cloth  6 each Topical Q0600  . insulin aspart  0-9 Units Subcutaneous Q6H  . pantoprazole  40 mg Oral Daily  . rosuvastatin  40 mg Oral q1800   Continuous Infusions: .  ceFAZolin (ANCEF) IV 2 g (04/20/18 0646)  . heparin 1,350 Units/hr (04/20/18 0725)  . rifampin (RIFADIN) IVPB 300 mg (04/20/18 0456)   PRN Meds: bisacodyl, ondansetron **OR** ondansetron (ZOFRAN) IV   Vital Signs    Vitals:   04/19/18 1300 04/19/18 2025 04/19/18 2046 04/20/18 0447  BP:   112/70 101/73  Pulse: 84  90 87  Resp: (!) 22  (!) 25 (!) 25  Temp:  98.3 F (36.8 C)  97.9 F (36.6 C)  TempSrc:    Oral  SpO2: 98%  98% 97%  Weight:      Height:        Intake/Output Summary (Last 24 hours) at 04/20/2018 1041 Last data filed at 04/20/2018 2353 Gross per 24 hour  Intake 804.18 ml  Output 850 ml  Net -45.82 ml   Filed Weights   04/18/18 1214 04/19/18 0019  Weight: 77.5 kg 79.4 kg    Physical Exam   General: Well developed, well nourished, NAD Skin: Warm, dry, intact  Head: Normocephalic, atraumatic, clear, moist mucus membranes. Neck: Negative for carotid bruits. No JVD Lungs:Clear to ausculation bilaterally. No wheezes, rales, or rhonchi. Breathing is unlabored. Cardiovascular: RRR with S1 S2. No murmurs, rubs, gallops, or LV heave appreciated. Abdomen: Soft, non-tender, non-distended with normoactive bowel sounds. No obvious abdominal masses. MSK: Strength and tone appear normal for age. 5/5 in all extremities Extremities: No edema. No clubbing or cyanosis. DP/PT pulses 2+ bilaterally Neuro: Alert and oriented. No focal deficits. No facial asymmetry. MAE spontaneously. Psych: Responds to  questions appropriately with normal affect.    Labs    Chemistry Recent Labs  Lab 04/16/18 1837  04/18/18 1219 04/19/18 0742 04/20/18 0633  NA 133*   < > 138 140 136  K 3.7   < > 3.0* 3.3* 3.3*  CL 100   < > 109 111 106  CO2 24   < > 20* 21* 21*  GLUCOSE 200*   < > 174* 109* 116*  BUN 21   < > 21 21 18   CREATININE 1.14   < > 1.39* 1.23 1.14  CALCIUM 8.9   < > 8.1* 8.1* 7.8*  PROT 6.5  --  5.8* 5.0*  --   ALBUMIN 3.3*  --  2.7* 2.4*  --   AST 26  --  38 42*  --   ALT 20  --  20 19  --   ALKPHOS 80  --  70 55  --   BILITOT 0.9  --  1.2 0.9  --   GFRNONAA 59*   < > 47* 54* 59*  GFRAA >60   < > 54* >60 >60  ANIONGAP 9   < > 9 8 9    < > = values in this interval not displayed.     Hematology Recent Labs  Lab 04/18/18 1219 04/19/18 0742 04/19/18 1052 04/20/18 0633  WBC 8.3 8.5  --  9.3  RBC 3.39* 3.16* 3.16* 3.20*  HGB 9.6* 9.2*  --  9.0*  HCT 29.9* 27.8*  --  27.9*  MCV 88.2 88.0  --  87.2  MCH 28.3 29.1  --  28.1  MCHC 32.1 33.1  --  32.3  RDW 15.2 15.2  --  15.1  PLT 149* 138*  --  142*    Cardiac Enzymes Recent Labs  Lab 04/18/18 1551 04/18/18 1859 04/18/18 2201  TROPONINI 4.48* 4.01* 3.59*   No results for input(s): TROPIPOC in the last 168 hours.   BNP Recent Labs  Lab 04/19/18 0205  BNP 553.6*     DDimer No results for input(s): DDIMER in the last 168 hours.   Radiology    Ct Head Wo Contrast  Result Date: 04/18/2018 CLINICAL DATA:  Altered mental status beginning 0800 hours today. Fever. EXAM: CT HEAD WITHOUT CONTRAST TECHNIQUE: Contiguous axial images were obtained from the base of the skull through the vertex without intravenous contrast. COMPARISON:  MRI 01/26/2018 FINDINGS: Brain: Chronic brain atrophy. Chronic small-vessel ischemic changes of the cerebral hemispheric white matter. Chronic ventriculomegaly presumed secondary to central atrophy. No sign of acute infarction, mass lesion, change in ventricular size or extra-axial collection.  Vascular: There is atherosclerotic calcification of the major vessels at the base of the brain. Skull: Negative Sinuses/Orbits: Clear/normal Other: None IMPRESSION: No acute finding by CT. Atrophy and chronic small-vessel ischemic changes. Chronic ventriculomegaly felt consistent with central atrophy. Electronically Signed   By: Nelson Chimes M.D.   On: 04/18/2018 16:24   Dg Chest Port 1 View  Result Date: 04/18/2018 CLINICAL DATA:  Rales at right lung base. EXAM: PORTABLE CHEST 1 VIEW COMPARISON:  04/16/2018 FINDINGS: Mild cardiac enlargement and aortic atherosclerosis. There is diffuse pulmonary vascular congestion there is asymmetric opacification of the left midlung and left base. IMPRESSION: 1. Pulmonary vascular congestion with asymmetric increased opacification of the left midlung and left base. Electronically Signed   By: Kerby Moors M.D.   On: 04/18/2018 13:18   Telemetry    04/20/2018 NSR HR 70- Personally Reviewed  ECG    No acute changes as of 04/20/2018  - Personally Reviewed  Cardiac Studies   Echocardiogram 01/2018: - Left ventricle: The cavity size was normal. Wall thickness was increased in a pattern of mild LVH. Systolic function was vigorous. The estimated ejection fraction was in the range of 65% to 70%. Wall motion was normal; there were no regional wall motion abnormalities. Doppler parameters are consistent with abnormal left ventricular relaxation (grade 1 diastolic dysfunction). - Aortic valve: A 23 mm [facility] Ease pericardial prosthesis is in the aortic position with grossly normal function based on gradients. There was no significant regurgitation. Mean gradient (S): 18 mm Hg. Peak gradient (S): 35 mm Hg. Valve area (VTI): 1.47 cm^2. - Mitral valve: Mildly to moderately calcified annulus. There was trivial regurgitation. - Left atrium: The atrium was moderately dilated. - Right atrium: Central venous pressure (est): 3 mm Hg. -  Atrial septum: No defect or patent foramen ovale was identified. - Tricuspid valve: There was trivial regurgitation. - Pulmonary arteries: Systolic pressure could not be accurately estimated. - Pericardium, extracardiac: There was no pericardial effusion  Patient Profile     85 y.o. male with a hx of CAD, AS s/p CABG-AVR in 2012, HTN, HLD, DM2, dementia, who is being seen today for the evaluation of elevated troponin at the request of Dr. Denton Brick.  Assessment & Plan    1. Elevated troponin with new ECG changes- concerning for Demand  Ischemia (Myocardial injury due to Bacteremia/Sepsis) vs. ? late presenting NSTEMI with prior hx of CABG (2012): -Pt presented with fever and confusion one day after discharge for similar presentation, found to have elevated troponin level at 4.8>>trended to 3.6 -Initial EKG with large Q waves with ST elevations, however per chart review, after repeat EKG on arrival to [facility], appears to be more consistent with prior tracing suggesting lead placement rather than recent MI with recommendations to continue intermittent EKG to follow ; prefer to consider Demand Ischemia over MI. -Denies chest pain  -Trop, 4.48>4.01>3.59 -Continue statin,   -Hep gtt>>>to be stopped 04/21/2018 -Will likely need further ischemic workup however currently limited by acute infection -- can assess in OP setting once infection cleared.  -Continue to follow for timing   2. Sepsis with negative staph bacteremia with hx of AVR: -Prior hx of AVR, currently on broad spectrum abx per primary team  -Plan for TEE for further evaluation tomorrow to access prior AVR, 04/21/2018 -ID following>>>to have PICC placed for IV abx infusion   3. HTN: -Low on presentation>>attributed to sepsis>>101/73>112/70>105/65 -Antihypertensives currently on hold secondary to soft BP -Echo with pending results>>TEE tomorrow   4. HLD: -Continue statin   5. Anemia: -Hb, 9.0 today>>prior transfusions>>on PPI    6. Hypokalmeia: -K+, 3.3 today>>>replaced per primary team    Signed, Kathyrn Drown NP-C [facility] Pager: [phone] 04/20/2018, 10:41 AM     ATTENDING ATTESTATION  I have seen, examined and evaluated the patient this PM along with Kathyrn Drown, NP-C.  After reviewing all the available data and chart, we discussed the patients laboratory, study & physical findings as well as symptoms in detail. I agree with her findings, examination as well as impression recommendations as per our discussion.    Attending adjustments noted in italics.   Demand Ischemia vs. ? Silent MI (has known CAD) in setting of MV CAD-CABG.  -- with ongoing infection will need to delay ischemic evaluation until more stable. --> relatively normal LV Fnx on Echo with no RWMA - would argue against significant MI.  Prior AVR with bacteremia - agree with TEE.  Will follow    Glenetta Hew, M.D., M.S. Interventional Cardiologist   Pager # [phone] Phone # [phone] [address] [place], [place] [address] ]  For questions or updates, please contact   Please consult [email or web address] for contact info under Cardiology/STEMI.

## 2018-04-21 ENCOUNTER — Inpatient Hospital Stay: Payer: Self-pay

## 2018-04-21 ENCOUNTER — Other Ambulatory Visit: Payer: Self-pay | Admitting: *Deleted

## 2018-04-21 ENCOUNTER — Encounter (HOSPITAL_COMMUNITY)
Admission: EM | Disposition: A | Discharge status: Home Health | Payer: Self-pay | Source: Home / Self Care | Attending: Internal Medicine

## 2018-04-21 ENCOUNTER — Encounter: Payer: Self-pay | Admitting: *Deleted

## 2018-04-21 ENCOUNTER — Inpatient Hospital Stay (HOSPITAL_COMMUNITY): Payer: Medicare HMO

## 2018-04-21 ENCOUNTER — Telehealth: Payer: Self-pay | Admitting: Family Medicine

## 2018-04-21 ENCOUNTER — Ambulatory Visit: Payer: Medicare HMO | Admitting: Neurology

## 2018-04-21 DIAGNOSIS — I10 Essential (primary) hypertension: Secondary | ICD-10-CM

## 2018-04-21 DIAGNOSIS — I342 Nonrheumatic mitral (valve) stenosis: Secondary | ICD-10-CM

## 2018-04-21 DIAGNOSIS — B9561 Methicillin susceptible Staphylococcus aureus infection as the cause of diseases classified elsewhere: Secondary | ICD-10-CM

## 2018-04-21 HISTORY — PX: TEE WITHOUT CARDIOVERSION: SHX5443

## 2018-04-21 LAB — BASIC METABOLIC PANEL
Anion gap: 9 (ref 5–15)
BUN: 13 mg/dL (ref 8–23)
CALCIUM: 7.8 mg/dL — AB (ref 8.9–10.3)
CO2: 23 mmol/L (ref 22–32)
Chloride: 104 mmol/L (ref 98–111)
Creatinine, Ser: 1.01 mg/dL (ref 0.61–1.24)
GFR calc Af Amer: >60 mL/min (ref >60)
GFR calc non Af Amer: >60 mL/min (ref >60)
Glucose, Bld: 108 mg/dL — ABNORMAL HIGH (ref 70–99)
Potassium: 3.4 mmol/L — ABNORMAL LOW (ref 3.5–5.1)
Sodium: 136 mmol/L (ref 135–145)

## 2018-04-21 LAB — GLUCOSE, CAPILLARY
Glucose-Capillary: 100 mg/dL — ABNORMAL HIGH (ref 70–99)
Glucose-Capillary: 112 mg/dL — ABNORMAL HIGH (ref 70–99)
Glucose-Capillary: 127 mg/dL — ABNORMAL HIGH (ref 70–99)
Glucose-Capillary: 139 mg/dL — ABNORMAL HIGH (ref 70–99)
Glucose-Capillary: 94 mg/dL (ref 70–99)
Glucose-Capillary: 97 mg/dL (ref 70–99)

## 2018-04-21 LAB — CBC
HCT: 27 % — ABNORMAL LOW (ref 39.0–52.0)
Hemoglobin: 8.8 g/dL — ABNORMAL LOW (ref 13.0–17.0)
MCH: 28.4 pg (ref 26.0–34.0)
MCHC: 32.6 g/dL (ref 30.0–36.0)
MCV: 87.1 fL (ref 80.0–100.0)
Platelets: 144 10*3/uL — ABNORMAL LOW (ref 150–400)
RBC: 3.1 MIL/uL — ABNORMAL LOW (ref 4.22–5.81)
RDW: 15.2 % (ref 11.5–15.5)
WBC: 6.8 10*3/uL (ref 4.0–10.5)
nRBC: 0 % (ref 0.0–0.2)

## 2018-04-21 LAB — HEPARIN LEVEL (UNFRACTIONATED): Heparin Unfractionated: 0.33 IU/mL (ref 0.30–0.70)

## 2018-04-21 LAB — MAGNESIUM: Magnesium: 1.8 mg/dL (ref 1.7–2.4)

## 2018-04-21 SURGERY — ECHOCARDIOGRAM, TRANSESOPHAGEAL
Anesthesia: Moderate Sedation

## 2018-04-21 MED ORDER — MIDAZOLAM HCL (PF) 5 MG/ML IJ SOLN
INTRAMUSCULAR | Status: AC
  Filled 2018-04-21: qty 2

## 2018-04-21 MED ORDER — FENTANYL CITRATE (PF) 100 MCG/2ML IJ SOLN
INTRAMUSCULAR | Status: AC
  Filled 2018-04-21: qty 2

## 2018-04-21 MED ORDER — HEPARIN SODIUM (PORCINE) 5000 UNIT/ML IJ SOLN
5000.0000 [IU] | Freq: Three times a day (TID) | INTRAMUSCULAR | Status: DC
  Administered 2018-04-21 (×2): 5000 [IU] via SUBCUTANEOUS
  Filled 2018-04-21 (×3): qty 1

## 2018-04-21 MED ORDER — POTASSIUM CHLORIDE CRYS ER 20 MEQ PO TBCR
40.0000 meq | EXTENDED_RELEASE_TABLET | Freq: Once | ORAL | Status: AC
  Administered 2018-04-21: 40 meq via ORAL
  Filled 2018-04-21: qty 2

## 2018-04-21 MED ORDER — BUTAMBEN-TETRACAINE-BENZOCAINE 2-2-14 % EX AERO
INHALATION_SPRAY | CUTANEOUS | Status: DC | PRN
  Administered 2018-04-21: 2 via TOPICAL

## 2018-04-21 MED ORDER — MIDAZOLAM HCL (PF) 10 MG/2ML IJ SOLN
INTRAMUSCULAR | Status: DC | PRN
  Administered 2018-04-21 (×3): 1 mg via INTRAVENOUS

## 2018-04-21 MED ORDER — SODIUM CHLORIDE 0.9 % IV SOLN: INTRAVENOUS | Status: DC

## 2018-04-21 MED ORDER — FENTANYL CITRATE (PF) 100 MCG/2ML IJ SOLN
INTRAMUSCULAR | Status: DC | PRN
  Administered 2018-04-21 (×2): 12.5 ug via INTRAVENOUS

## 2018-04-21 NOTE — Progress Notes (Signed)
[facility] for Infectious Disease   Reason for visit: Follow up on bacteremia  Interval History: repeat blood cultures remain no growth. TEE completed this AM and no evidence of valve infection.   He is ready to leave and go home. OK with home IV antibiotics.   Physical Exam: Constitutional:  Vitals:   04/21/18 1055 04/21/18 1221  BP: (!) 109/57   Pulse: 78   Resp: 20   Temp:  98.2 F (36.8 C)  SpO2: 98%    Resting comfortably in bed. Appears in NAD Eyes: anicteric HENT: no thrush Respiratory: Normal respiratory effort; CTA B Cardiovascular: RRR GI: soft, nt, nd  Review of Systems: Constitutional: negative for fevers and chills Gastrointestinal: negative for nausea, vomiting diarrhea Musculoskeletal: negative for myalgias and arthralgias Integumentary: no rashes   Lab Results  Component Value Date   WBC 6.8 04/21/2018   HGB 8.8 (L) 04/21/2018   HCT 27.0 (L) 04/21/2018   MCV 87.1 04/21/2018   PLT 144 (L) 04/21/2018    Lab Results  Component Value Date   CREATININE 1.01 04/21/2018   BUN 13 04/21/2018   NA 136 04/21/2018   K 3.4 (L) 04/21/2018   CL 104 04/21/2018   CO2 23 04/21/2018    Lab Results  Component Value Date   ALT 19 04/19/2018   AST 42 (H) 04/19/2018   ALKPHOS 55 04/19/2018     Microbiology: Recent Results (from the past 240 hour(s))  Blood Culture (routine x 2)     Status: Abnormal   Collection Time: 04/16/18  6:30 PM  Result Value Ref Range Status   Specimen Description   Final    BLOOD LEFT WRIST Performed at [facility], [address] [place], [place] [address]    Special Requests   Final    BOTTLES DRAWN AEROBIC AND ANAEROBIC Blood Culture adequate volume Performed at [facility], [address], [place], [place] [address]    Culture  Setup Time   Final    GRAM POSITIVE COCCI AEROBIC , ANAEROBIC Gram Stain Report Called to,Read Back By and Verified With: KEMP @ 3016  [id number] BY HENDERSON L. CRITICAL RESULT CALLED TO,  READ BACK BY AND VERIFIED WITH: C KEMP 04/17/18 1728 JDW    Culture (A)  Final    STAPHYLOCOCCUS EPIDERMIDIS SUSCEPTIBILITIES PERFORMED ON PREVIOUS CULTURE WITHIN THE LAST 5 DAYS. Performed at [facility], [place] [address], [place], [place] [address]    Report Status 04/19/2018 FINAL  Final  Blood Culture (routine x 2)     Status: Abnormal   Collection Time: 04/16/18  6:38 PM  Result Value Ref Range Status   Specimen Description   Final    LEFT ANTECUBITAL Performed at [facility], [address] [place], [place] [address]    Special Requests   Final    BOTTLES DRAWN AEROBIC AND ANAEROBIC Blood Culture adequate volume Performed at [facility], [address] [place], [place] [address]    Culture  Setup Time   Final    GRAM POSITIVE COCCI ANAEROBIC Gram Stain Report Called to,Read Back By and Verified With: CARDWELL L. @1401  ON [id number] BY HENDERSON L. GRAM POSITIVE COCCI AEROBIC RESULT PREV. CALLED CRITICAL RESULT CALLED TO, READ BACK BY AND VERIFIED WITH: C KEMP RN 04/17/18 1728 JDW Performed at [facility], [place] [address], [place], [place] [address]    Culture STAPHYLOCOCCUS EPIDERMIDIS (A)  Final   Report Status 04/19/2018 FINAL  Final   Organism  ID, Bacteria STAPHYLOCOCCUS EPIDERMIDIS  Final      Susceptibility   Staphylococcus epidermidis - MIC*    CIPROFLOXACIN <=0.5 SENSITIVE Sensitive     ERYTHROMYCIN >=8 RESISTANT Resistant     GENTAMICIN <=0.5 SENSITIVE Sensitive     OXACILLIN <=0.25 SENSITIVE Sensitive     TETRACYCLINE <=1 SENSITIVE Sensitive     VANCOMYCIN 1 SENSITIVE Sensitive     TRIMETH/SULFA <=10 SENSITIVE Sensitive     CLINDAMYCIN <=0.25 SENSITIVE Sensitive     RIFAMPIN <=0.5 SENSITIVE Sensitive     Inducible Clindamycin NEGATIVE Sensitive     * STAPHYLOCOCCUS EPIDERMIDIS  Blood Culture ID Panel (Reflexed)     Status: Abnormal   Collection Time: 04/16/18  6:38 PM  Result Value Ref Range Status   Enterococcus species NOT DETECTED NOT DETECTED  Final   Listeria monocytogenes NOT DETECTED NOT DETECTED Final   Staphylococcus species DETECTED (A) NOT DETECTED Final    Comment: Methicillin (oxacillin) susceptible coagulase negative staphylococcus. Possible blood culture contaminant (unless isolated from more than one blood culture draw or clinical case suggests pathogenicity). No antibiotic treatment is indicated for blood  culture contaminants. CRITICAL RESULT CALLED TO, READ BACK BY AND VERIFIED WITH: C KEMP RN ([facility]) 04/17/18 1728 JDW    Staphylococcus aureus (BCID) NOT DETECTED NOT DETECTED Final   Methicillin resistance NOT DETECTED NOT DETECTED Final   Streptococcus species NOT DETECTED NOT DETECTED Final   Streptococcus agalactiae NOT DETECTED NOT DETECTED Final   Streptococcus pneumoniae NOT DETECTED NOT DETECTED Final   Streptococcus pyogenes NOT DETECTED NOT DETECTED Final   Acinetobacter baumannii NOT DETECTED NOT DETECTED Final   Enterobacteriaceae species NOT DETECTED NOT DETECTED Final   Enterobacter cloacae complex NOT DETECTED NOT DETECTED Final   Escherichia coli NOT DETECTED NOT DETECTED Final   Klebsiella oxytoca NOT DETECTED NOT DETECTED Final   Klebsiella pneumoniae NOT DETECTED NOT DETECTED Final   Proteus species NOT DETECTED NOT DETECTED Final   Serratia marcescens NOT DETECTED NOT DETECTED Final   Haemophilus influenzae NOT DETECTED NOT DETECTED Final   Neisseria meningitidis NOT DETECTED NOT DETECTED Final   Pseudomonas aeruginosa NOT DETECTED NOT DETECTED Final   Candida albicans NOT DETECTED NOT DETECTED Final   Candida glabrata NOT DETECTED NOT DETECTED Final   Candida krusei NOT DETECTED NOT DETECTED Final   Candida parapsilosis NOT DETECTED NOT DETECTED Final   Candida tropicalis NOT DETECTED NOT DETECTED Final    Comment: Performed at [facility], [place] [address] [place], [place] [address]  Culture, Urine     Status: None   Collection Time: 04/16/18  6:45 PM  Result Value Ref Range Status    Specimen Description URINE, CLEAN CATCH  Final   Special Requests   Final    NONE Performed at [facility], [address], [place], [place] [address]    Culture NO GROWTH  Final   Report Status 04/18/2018 FINAL  Final  Culture, blood (Routine x 2)     Status: None (Preliminary result)   Collection Time: 04/18/18 12:21 PM  Result Value Ref Range Status   Specimen Description   Final    BLOOD RIGHT ARM BOTTLES DRAWN AEROBIC AND ANAEROBIC   Special Requests Blood Culture adequate volume  Final   Culture   Final    NO GROWTH 3 DAYS Performed at [facility], [address], [place], [place] [address]    Report Status PENDING  Incomplete  Culture, blood (Routine x 2)     Status:  None (Preliminary result)   Collection Time: 04/18/18 12:49 PM  Result Value Ref Range Status   Specimen Description   Final    BLOOD LEFT HAND BOTTLES DRAWN AEROBIC AND ANAEROBIC   Special Requests Blood Culture adequate volume  Final   Culture   Final    NO GROWTH 3 DAYS Performed at [facility], [address], [place], [place] [address]    Report Status PENDING  Incomplete  Culture, Urine     Status: None   Collection Time: 04/18/18  1:22 PM  Result Value Ref Range Status   Specimen Description   Final    URINE, CLEAN CATCH Performed at [facility], [address], [place], [place] [address]    Special Requests   Final    NONE Performed at [facility], [address] [place], [place] [address]    Culture   Final    NO GROWTH Performed at [facility], [place] [address] [place], [place] [address]    Report Status 04/20/2018 FINAL  Final  MRSA PCR Screening     Status: None   Collection Time: 04/19/18 12:27 AM  Result Value Ref Range Status   MRSA by PCR NEGATIVE NEGATIVE Final    Comment:        The GeneXpert MRSA Assay (FDA approved for NASAL specimens only), is one component of a comprehensive MRSA colonization surveillance program. It is not intended to diagnose MRSA infection nor  to guide or monitor treatment for MRSA infections. Performed at [facility], [place] [address], [place], [place] [address]     Impression/Plan:  1. Methicillin Sensitive Staph Epidermidis Bacteremia - repeat cultures ngtd. TEE negative. Will stop rifampin. Discussed recommendations for 2 weeks IV therapy. OK to place PICC line (ordered). CM consult for home antibiotics. Will consider checking surveillance cultures at follow up appointment.   2. Medication Management - OPAT orders as outlined below.   3. Discharge Planing - explained PICC line, care and maintenance, recommended course of therapy and plan for follow up care. Arranged and outlined below.   OPAT ORDERS:  Diagnosis: Bacteremia  Culture Result: MSSE  No Known Allergies  Discharge antibiotics: Cefazolin 2gm IV q8h   Duration: 14d   End Date: 05/02/18  [facility] Care and Maintenance Per Protocol _x_ Please pull PIC at completion of IV antibiotics __ Please leave PIC in place until doctor has seen patient or been notified  Labs weekly while on IV antibiotics: _x_ CBC with differential _x_ BMP   Fax weekly labs to [phone]  Clinic Follow Up Appt: 05/12/18 @ 11:00 am with Dr. Bayard Males, MSN, NP-C [facility] for Infectious Disease [email or web address] Pager: [phone] Office: [phone]

## 2018-04-21 NOTE — Progress Notes (Addendum)
Progress Note  Patient Name: [name] Date of Encounter: 04/21/2018  Primary Cardiologist: Minus Breeding, MD  Subjective   Pt well. Wants to go home. PICC to be placed for home infusions. Denies chest pain or SOB   Inpatient Medications    Scheduled Meds: . aspirin EC  81 mg Oral BH-q7a  . Chlorhexidine Gluconate Cloth  6 each Topical Q0600  . furosemide  20 mg Oral Daily  . heparin injection (subcutaneous)  5,000 Units Subcutaneous Q8H  . insulin aspart  0-9 Units Subcutaneous Q6H  . metoprolol succinate  12.5 mg Oral QAC breakfast  . pantoprazole  40 mg Oral Daily  . polyethylene glycol  17 g Oral Daily  . risperiDONE  0.25 mg Oral TID  . rosuvastatin  40 mg Oral q1800   Continuous Infusions: .  ceFAZolin (ANCEF) IV 2 g (04/20/18 2345)   PRN Meds: bisacodyl, nitroGLYCERIN, ondansetron **OR** ondansetron (ZOFRAN) IV   Vital Signs    Vitals:   04/21/18 1048 04/21/18 1050 04/21/18 1055 04/21/18 1221  BP: (!) 122/48 (!) 122/48 (!) 109/57   Pulse: 86 87 78   Resp: 20 20 20    Temp:    98.2 F (36.8 C)  TempSrc:    Oral  SpO2: 94% 98% 98%   Weight:      Height:        Intake/Output Summary (Last 24 hours) at 04/21/2018 1258 Last data filed at 04/21/2018 0700 Gross per 24 hour  Intake 481.59 ml  Output 1030 ml  Net -548.41 ml   Filed Weights   04/18/18 1214 04/19/18 0019  Weight: 77.5 kg 79.4 kg    Physical Exam   General: Well developed, well nourished, NAD Skin: Warm, dry, intact  Head: Normocephalic, atraumatic, clear, moist mucus membranes. Neck: Negative for carotid bruits. No JVD Lungs:Clear to ausculation bilaterally. No wheezes, rales, or rhonchi. Breathing is unlabored. Cardiovascular: RRR with S1 S2. No murmurs, rubs, gallops, or LV heave appreciated. Abdomen: Soft, non-tender, non-distended with normoactive bowel sounds. No obvious abdominal masses. MSK: Strength and tone appear normal for age. 5/5 in all extremities Extremities: No  edema. No clubbing or cyanosis. DP/PT pulses 2+ bilaterally Neuro: Alert and oriented. No focal deficits. No facial asymmetry. MAE spontaneously. Psych: Responds to questions appropriately with normal affect.    Labs    Chemistry Recent Labs  Lab 04/16/18 1837  04/18/18 1219 04/19/18 0742 04/20/18 0633 04/21/18 0647  NA 133*   < > 138 140 136 136  K 3.7   < > 3.0* 3.3* 3.3* 3.4*  CL 100   < > 109 111 106 104  CO2 24   < > 20* 21* 21* 23  GLUCOSE 200*   < > 174* 109* 116* 108*  BUN 21   < > 21 21 18 13   CREATININE 1.14   < > 1.39* 1.23 1.14 1.01  CALCIUM 8.9   < > 8.1* 8.1* 7.8* 7.8*  PROT 6.5  --  5.8* 5.0*  --   --   ALBUMIN 3.3*  --  2.7* 2.4*  --   --   AST 26  --  38 42*  --   --   ALT 20  --  20 19  --   --   ALKPHOS 80  --  70 55  --   --   BILITOT 0.9  --  1.2 0.9  --   --   GFRNONAA 59*   < >  47* 54* 59* >60  GFRAA >60   < > 54* >60 >60 >60  ANIONGAP 9   < > 9 8 9 9    < > = values in this interval not displayed.     Hematology Recent Labs  Lab 04/19/18 0742 04/19/18 1052 04/20/18 0633 04/21/18 0647  WBC 8.5  --  9.3 6.8  RBC 3.16* 3.16* 3.20* 3.10*  HGB 9.2*  --  9.0* 8.8*  HCT 27.8*  --  27.9* 27.0*  MCV 88.0  --  87.2 87.1  MCH 29.1  --  28.1 28.4  MCHC 33.1  --  32.3 32.6  RDW 15.2  --  15.1 15.2  PLT 138*  --  142* 144*    Cardiac Enzymes Recent Labs  Lab 04/18/18 1551 04/18/18 1859 04/18/18 2201  TROPONINI 4.48* 4.01* 3.59*   No results for input(s): TROPIPOC in the last 168 hours.   BNP Recent Labs  Lab 04/19/18 0205  BNP 553.6*     DDimer No results for input(s): DDIMER in the last 168 hours.   Radiology    [place] Ekg Site Rite  Result Date: 04/21/2018 If [facility] image not attached, placement could not be confirmed due to current cardiac rhythm.  Telemetry    04/21/2018 NSR - Personally Reviewed  ECG    No new tracings as of 04/21/2018 - Personally Reviewed  Cardiac Studies   Echocardiogram 01/2018: - Left ventricle:  The cavity size was normal. Wall thickness was increased in a pattern of mild LVH. Systolic function was vigorous. The estimated ejection fraction was in the range of 65% to 70%. Wall motion was normal; there were no regional wall motion abnormalities. Doppler parameters are consistent with abnormal left ventricular relaxation (grade 1 diastolic dysfunction). - Aortic valve: A 23 mm [facility] Ease pericardial prosthesis is in the aortic position with grossly normal function based on gradients. There was no significant regurgitation. Mean gradient (S): 18 mm Hg. Peak gradient (S): 35 mm Hg. Valve area (VTI): 1.47 cm^2. - Mitral valve: Mildly to moderately calcified annulus. There was trivial regurgitation. - Left atrium: The atrium was moderately dilated. - Right atrium: Central venous pressure (est): 3 mm Hg. - Atrial septum: No defect or patent foramen ovale was identified. - Tricuspid valve: There was trivial regurgitation. - Pulmonary arteries: Systolic pressure could not be accurately estimated. - Pericardium, extracardiac: There was no pericardial effusion  Patient Profile     85 y.o. male  with a hx of CAD, AS s/p CABG-AVR in 2012, HTN, HLD, DM2, dementia,who is being seen today for the evaluation ofelevated troponinat the request of Dr. Denton Brick.  Assessment & Plan    1. Elevated troponin with new ECG changes concerning for demand ischemia versus late presenting NSTEMI:-Trop, 4.48>4.01>3.59, without history of CP -Pt presented with fever and confusion one day after discharge -with no mention of any chest discomfort.  This would argue more for demand ischemia over ACS/non-STEMI. -Initial EKG with large Q waves with ST elevations, however per chart review, after repeat EKG on arrival to [facility], appears to be more consistent with prior tracing suggesting lead placement rather than demand ischemia versus MI  -Hep gtt off 04/21/2018>>was continued for 48H  for possible ACS -Continue statin   -Regardless of etiology, with elevated troponin to this level, will likely need further ischemic workup in the OP setting once acute infection has cleared>>timing to be determined based on d/c   2. Sepsis with negative staph bacteremia with hx  of AVR: -Prior hx of AVR, currently on broad spectrum abx per primary team  -TEE performed with no evidence of aortic valve vegetation -ID following>>>to have PICC placed for IV abx infusion   3. HTN: -Low on presentation>>attributed to sepsis>>109/57>122/48>118/46 -Antihypertensives currently on hold secondary to soft BP  4. HLD: -Continue statin   5. Anemia: -Hb, 8.8 today>>down from 9.0 yesterday with hx of prior transfusions>>on PPI   6. Hypokalmeia: -K+, 3.4 today>>>replaced per primary team    Signed, Kathyrn Drown NP-C [facility] Pager: [phone] 04/21/2018, 12:58 PM     ATTENDING ATTESTATION  I have seen, examined and evaluated the patient this PM after initial rounding by Kathyrn Drown, NP-C.  After reviewing all the available data and chart, we discussed the patients laboratory, study & physical findings as well as symptoms in detail. I agree with her findings, examination as well as impression recommendations as per our discussion.    Attending adjustments noted in italics.  I saw him following completion of TEE after reviewing the results.  No evidence of aortic valve prosthesis related vegetation.  EF was read as being 45, but this is contradictory to transthoracic echocardiogram yesterday which is probably more accurate.  The patient himself denies any chest pain has not had any chest pain, therefore my suspicion is that this is probably related to demand ischemia related troponin elevation, especially with no evidence of any regional wall motion normalities.  Blood pressure has not been adequate to restart home antihypertensives.  Can probably restart in the outpatient setting.     Can discuss potential ischemic evaluation at that time once he is completed his IV antibiotic course.   [facility] will sign off - but will follow at a distance. Medication Recommendations: No change, continue to hold antihypertensives Other recommendations (labs, testing, etc): No further inpatient testing, consider outpatient ischemic evaluation with primary cardiologist. Follow up as an outpatient:  We will arrange follow-up with Dr. Percival Spanish in the [place] office.    Glenetta Hew, M.D., M.S. Interventional Cardiologist   Pager # [phone] Phone # [phone] [address] [place], [place] [address]    For questions or updates, please contact   Please consult [email or web address] for contact info under Cardiology/STEMI.

## 2018-04-21 NOTE — Telephone Encounter (Signed)
Please get person on the phone from rocking [facility] [place] services so I can speak to her regarding [name].

## 2018-04-21 NOTE — Progress Notes (Signed)
PT Cancellation Note  Patient Details Name: [name] MRN: [id number] DOB: [birth year]   Cancelled Treatment:    Reason Eval/Treat Not Completed: Patient declined, no reason specified patient asleep in chair, daughters present and provided home/PLOF information. Patient easily woken but extremely rude and acting very agitated towards therapist, refusing to attempt therapy and making multiple statements to the effect of "...this damn shit is crazy, I don't need you." and "...you need to leave, don't come back or I'll run you off then too". Daughters and RN attempted to assist in convincing patient to participate in PT, patient continued to refuse. Plan to attempt to try back on next date of service.     Deniece Ree PT, DPT, CBIS  Supplemental Physical Therapist [facility]    Pager [phone] Acute Rehab Office [phone]

## 2018-04-21 NOTE — Consult Note (Addendum)
   [name] CM Inpatient Consult   04/21/2018  [name] 01-05-1933 [id number]    Patient screened for potential [name] services due to unplanned readmission risk score of 32% (extreme) and multiple hospitalizations.  Went to bedside to speak with [name] and his daughters about [name] services. Patient's daughter [name]) states "he can use all the help he can get."  [name] is agreeable to [name] follow up and [name] written consent obtained. [name] folder provided.   [name] lives alone. However, his daughter [name]) reports patient has caregiver assist during the day. States they are in the process of trying to find more help to stay with patient in the evenings.   Confirmed Primary Care Provider is Dr. Laurance Flatten [facility] office listed as doing transition of care call).  Explained that [place] [name] will not interfere or replace services provided by home health.   [name] denies concerns with medications or with transportation. Patient's daughter [name]) states patient's daytime caregiver transports patient to MD appointments. Also states patient's children assist with appointments if needed. [name] to also contact [employer]'s customer service line to inquire about other potential benefits regarding meals and transportation thru Mr. [employer], if needed. [name] expresses understanding.  Discussed writer will make referral to [name] for follow up regarding complex case [name].   Emergency contacts listed on [name] consent: [name]- daughter/POA [phone] [name]- daughter [phone]   Confirmed best contact telephone number for [name] as [phone].  Made inpatient RNCM aware [name] will follow up post hospital discharge.    Marthenia Rolling, MSN-Ed, RN,BSN [facility] Liaison [phone]

## 2018-04-21 NOTE — CV Procedure (Signed)
    Transesophageal Echocardiogram Note  [name] [id number] December 30, 1932  Procedure: Transesophageal Echocardiogram Indications: bacteremia   Procedure Details Consent: Obtained Time Out: Verified patient identification, verified procedure, site/side was marked, verified correct patient position, special equipment/implants available, Radiology Safety Procedures followed,  medications/allergies/relevent history reviewed, required imaging and test results available.  Performed  Medications:  During this procedure the patient is administered a total of Versed 3  mg and Fentanyl 50  mcg  to achieve and maintain moderate conscious sedation.  The patient's heart rate, blood pressure, and oxygen saturation are monitored continuously during the procedure. The period of conscious sedation is 30 minutes, of which I was present face-to-face 100% of this time.  Left Ventrical:  Mild LV dyfunction .   Heart is rotated so assessment is difficult -   EF 45%   Mitral Valve: mild MS,  Mild MR , no vegetation   Aortic Valve: bioprosthetic AV.   Mild thickening .  No obvious vegetatoin   Tricuspid Valve: mild TR,  No vegetation   Pulmonic Valve: no vegetation.   Not well visualized   Left Atrium/ Left atrial appendage:  No LAA thrombi   Atrial septum: intact by color doppler   Aorta: mild calcification    Complications: No apparent complications Patient did tolerate procedure well.   Thayer Headings, Brooke Bonito., MD, [facility] 04/21/2018, 10:41 AM

## 2018-04-21 NOTE — Progress Notes (Signed)
PT Cancellation Note  Patient Details Name: [name] MRN: [id number] DOB: 1932-12-08   Cancelled Treatment:    Reason Eval/Treat Not Completed: Patient at procedure or test/unavailable(at ENDO)   Duncan Dull 04/21/2018, 9:46 AM

## 2018-04-21 NOTE — Telephone Encounter (Signed)
Rexene Alberts with [facility] would like to speak with you regarding [name] and his current condition with decision making.  She is working with his case and needs a little more insight to his abilities.

## 2018-04-21 NOTE — Telephone Encounter (Signed)
LM for Mel Almond to [facility] 04/21/18.

## 2018-04-21 NOTE — Consult Note (Addendum)
   [facility] CM Inpatient Consult   04/21/2018  KOLTER REAVER 05-26-1932 [id number]   Received message from [place] requesting that writer follow up with patient's daughter regarding [employer] program.  Previously met with Mr. Latulippe and his other daughters at bedside. However, [name] should be primary contact.   Telephone call to speak with [name] (daughter/POA) at [phone]. Made Christy aware that Probation officer has already signed Mr. Fitzpatrick up for [employer]. [name] asks that she be contacted for post discharge calls. Mr. Pineo was previously agreeable to this. [name] also states she would like Education officer, museum follow up regarding meals and transportation. Thamas Jaegers to contact [facility] for potential benefits. However, [name] states she would still like [facility] Social Worker follow up.   Will make additional referral for [facility] social worker per daughter's request. Previous [place] referral was already made.   [name] daughter/POA to be contacted for post hospital discharge calls [phone].  Marthenia Rolling, MSN-Ed, RN,BSN [facility] Liaison [phone]

## 2018-04-21 NOTE — Interval H&P Note (Signed)
History and Physical Interval Note:  04/21/2018 9:50 AM  [name]  has presented today for surgery, with the diagnosis of BACTEREMIA  The various methods of treatment have been discussed with the patient and family. After consideration of risks, benefits and other options for treatment, the patient has consented to  Procedure(s): TRANSESOPHAGEAL ECHOCARDIOGRAM (TEE) (N/A) as a surgical intervention .  The patient's history has been reviewed, patient examined, no change in status, stable for surgery.  I have reviewed the patient's chart and labs.  Questions were answered to the patient's satisfaction.     Mertie Moores

## 2018-04-21 NOTE — Progress Notes (Addendum)
Patient alert and expresses desire to go home. Patient's family is requesting help at home, stating he will need help with ADLs and possibly hysical therapy.

## 2018-04-21 NOTE — Patient Outreach (Signed)
[facility]) Care Management  04/21/2018  [name] 04-01-1932 [id number]   Care coordination   [facility] RN CM received a message from his daughter, [name] abut need of [facility] assistance for transportation to medical appointments and meals [facility] RN CM noted from reviewde of Epic that [name] is hospitalized [facility] RN CM sent in basket message to [facility] liaisons to engage [name] for [place] RN CM  Returned Christy's call. She confirmed [name] remains at [facility] because he has had "a mild heart attack, pneumonia and an infection that they are trying to find the source of" She reports that since the last contact with Cm the male caregiver during the day "did not work out" She reports that her and her sisters will be taking turns to stay with the patient [facility] RN CM discussed the contact with [facility] hospital liaison while at [facility] cone She voiced understanding and appreciation   Kimberly L. Lavina Hamman, RN, BSN, [place] Coordinator Office number [phone] Mobile number [phone]  Main [facility] number [phone] Fax number [phone]

## 2018-04-21 NOTE — Progress Notes (Signed)
ANTICOAGULATION CONSULT NOTE - Follow Up Consult  Pharmacy Consult for Heparin Indication: chest pain/ACS  No Known Allergies  Patient Measurements: Height: 5\' 7"  (170.2 cm) Weight: 175 lb 0.7 oz (79.4 kg) IBW/kg (Calculated) : 66.1 Heparin Dosing Weight:    Vital Signs: Temp: 98.4 F (36.9 C) (01/28 0400) Temp Source: Oral (01/28 0400) BP: 113/65 (01/28 0044) Pulse Rate: 76 (01/27 2029)  Labs: Recent Labs    04/18/18 1219 04/18/18 1551 04/18/18 1859 04/18/18 2201 04/19/18 0742  04/20/18 0633 04/20/18 1155 04/20/18 1854 04/21/18 0647  HGB 9.6*  --   --   --  9.2*  --  9.0*  --   --  8.8*  HCT 29.9*  --   --   --  27.8*  --  27.9*  --   --  27.0*  PLT 149*  --   --   --  138*  --  142*  --   --  144*  LABPROT 14.5  --   --   --   --   --   --   --   --   --   INR 1.14  --   --   --   --   --   --   --   --   --   HEPARINUNFRC  --   --   --   --   --    < > 0.23* 0.14* 0.42 0.33  CREATININE 1.39*  --   --   --  1.23  --  1.14  --   --  1.01  TROPONINI  --  4.48* 4.01* 3.59*  --   --   --   --   --   --    < > = values in this interval not displayed.    Estimated Creatinine Clearance: 56 mL/min (by C-G formula based on SCr of 1.01 mg/dL).  Assessment: [name] a 85 y.o.maleon IV heparin for ACS / elevated troponin. Goal 0.3-0.5 because of anemia.   Heparin level this morning is therapeutic. No s/s of bleeding per RN    Goal of Therapy:  Heparin level 0.3-0.5 Monitor platelets by anticoagulation protocol: Yes   Plan:  -Continue Heparin infusion at 1550 units/hr -Check HL and CBC in AM -Per cards, iv heparin to be stopped today 1/28   Erin Hearing PharmD., BCPS Clinical Pharmacist 04/21/2018 8:13 AM  Clinical phone for 04/20/18 until 10:30pm: [phone] If after 10:30pm, please refer to [facility] for unit-specific pharmacist

## 2018-04-21 NOTE — Progress Notes (Signed)
PROGRESS NOTE                                                                                                                                                                                                             Patient Demographics:    [name], is a 85 y.o. male, DOB - [birth year], MCN:[id number]  Admit date - 04/18/2018   Admitting Physician Bethena Roys, MD  Outpatient Primary MD for the patient is Chipper Herb, MD  LOS - 3  Chief Complaint  Patient presents with  . Altered Mental Status       Brief Narrative   [name] is a 85 y.o. male with medical history significant for HTN, AV replacement, BPH, CAD, DM2, CVA, who was brought to the [facility] ED with complaints of confusion and fevers.  Patient was discharged from the hospital yesterday- 1/23 - 1/24, admitted for similar complaints of fevers, ? confusion as the patient has baseline dementia.  Patient had had a transfusion the day before admission 1/22, fevers were attributed to transfusion reaction as focus of infection was not identified.   Through blood cultures blood cultures grew staph epidermidis in 3 out of 4 bottles, he was started on antibiotics, later on his troponin trended up and was transferred to [facility] for further care.   Subjective:   Patient in bed, appears comfortable, denies any headache, no fever, no chest pain or pressure, no shortness of breath , no abdominal pain. No focal weakness.    Assessment  & Plan :     1.  Sepsis with coag negative staph bacteremia.  Has history of AVR, currently on broad-spectrum antibiotics will taper down to vancomycin only at this time.  No cough or shortness of breath, no skin wounds.  Discussed with ID physician, patient currently on cefazolin and rifampin combination, TEE has been ordered and will be done later on 04/21/2018, surveillance cultures negative so far hence will request PICC line, duration course of antibiotic depending  on TEE results will be 2 weeks versus 6 weeks of IV antibiotics.  2.  Non-STEMI in a patient with AVR and CAD with CABG in 2012  history.  No chest pain, troponin seems to have peaked, heparin drip for a total 48 hrs, be stopped on 04/21/2018, seen by cardiology, continue statin for secondary prevention, will add baby aspirin.  Blood pressure too soft for beta-blocker.     3.  Hypokalemia.  replaced will recheck again  tomorrow.  4.  HX of iron deficiency anemia.  Received transfusion on last admission, currently stable placed on PPI will monitor, outpatient age-appropriate work-up.  5.  Severe dementia and depression.  Supportive care at risk for delirium family explained.  6.  DM type II.  Recent A1c was 6.5.  Hold metformin SSI.  CBG (last 3)  Recent Labs    04/21/18 0041 04/21/18 0425 04/21/18 0807  GLUCAP 100* 97 94     Family Communication  :  Daughter  Code Status :  Full  Disposition Plan  :    Consults  : ID, Cardiology  Procedures  :    TEE -   DVT Prophylaxis  :  Heparin gtt  Lab Results  Component Value Date   PLT 144 (L) 04/21/2018    Diet :  Diet Order            Diet NPO time specified  Diet effective now               Inpatient Medications Scheduled Meds: . [MAR Hold] aspirin EC  81 mg Oral BH-q7a  . [MAR Hold] Chlorhexidine Gluconate Cloth  6 each Topical Q0600  . [MAR Hold] furosemide  20 mg Oral Daily  . [MAR Hold] insulin aspart  0-9 Units Subcutaneous Q6H  . [MAR Hold] metoprolol succinate  12.5 mg Oral QAC breakfast  . [MAR Hold] pantoprazole  40 mg Oral Daily  . [MAR Hold] polyethylene glycol  17 g Oral Daily  . [MAR Hold] risperiDONE  0.25 mg Oral TID  . [MAR Hold] rosuvastatin  40 mg Oral q1800   Continuous Infusions: . sodium chloride    . [MAR Hold]  ceFAZolin (ANCEF) IV 2 g (04/20/18 2345)  . heparin 1,550 Units/hr (04/21/18 0601)  . [MAR Hold] rifampin (RIFADIN) IVPB 300 mg (04/21/18 0043)   PRN Meds:.[MAR Hold]  bisacodyl, [MAR Hold] nitroGLYCERIN, [MAR Hold] ondansetron **OR** [MAR Hold] ondansetron (ZOFRAN) IV  Antibiotics  :   Anti-infectives (From admission, onward)   Start     Dose/Rate Route Frequency Ordered Stop   04/19/18 1733  metroNIDAZOLE (FLAGYL) IVPB 500 mg  Status:  Discontinued     500 mg 100 mL/hr over 60 Minutes Intravenous Every 12 hours 04/19/18 0843 04/19/18 0856   04/19/18 1600  [MAR Hold]  rifampin (RIFADIN) 300 mg in sodium chloride 0.9 % 100 mL IVPB     (MAR Hold since Tue 04/21/2018 at 0850. Reason: Transfer to a Procedural area.)   300 mg 200 mL/hr over 30 Minutes Intravenous Every 8 hours 04/19/18 1456     04/19/18 1530  [MAR Hold]  ceFAZolin (ANCEF) IVPB 2g/100 mL premix     (MAR Hold since Tue 04/21/2018 at 0850. Reason: Transfer to a Procedural area.)   2 g 200 mL/hr over 30 Minutes Intravenous Every 8 hours 04/19/18 1456     04/19/18 1330  metroNIDAZOLE (FLAGYL) IVPB 500 mg  Status:  Discontinued     500 mg 100 mL/hr over 60 Minutes Intravenous Every 8 hours 04/19/18 0856 04/19/18 1452   04/19/18 1200  vancomycin (VANCOCIN) IVPB 1000 mg/200 mL premix  Status:  Discontinued     1,000 mg 200 mL/hr over 60 Minutes Intravenous Every 24 hours 04/18/18 1252 04/19/18 1452   04/18/18 2100  ceFEPIme (MAXIPIME) 2 g in sodium chloride 0.9 % 100 mL IVPB  Status:  Discontinued     2 g 200 mL/hr over 30 Minutes Intravenous Every 12 hours 04/18/18 1252 04/18/18  1432   04/18/18 2100  ceFEPIme (MAXIPIME) 2 g in sodium chloride 0.9 % 100 mL IVPB  Status:  Discontinued     2 g 200 mL/hr over 30 Minutes Intravenous Every 24 hours 04/18/18 1432 04/19/18 1452   04/18/18 1245  ceFEPIme (MAXIPIME) 2 g in sodium chloride 0.9 % 100 mL IVPB     2 g 200 mL/hr over 30 Minutes Intravenous  Once 04/18/18 1240 04/18/18 1341   04/18/18 1245  metroNIDAZOLE (FLAGYL) IVPB 500 mg  Status:  Discontinued     500 mg 100 mL/hr over 60 Minutes Intravenous Every 8 hours 04/18/18 1240 04/19/18 0843    04/18/18 1245  vancomycin (VANCOCIN) IVPB 1000 mg/200 mL premix  Status:  Discontinued     1,000 mg 200 mL/hr over 60 Minutes Intravenous  Once 04/18/18 1240 04/18/18 1244   04/18/18 1245  vancomycin (VANCOCIN) 1,500 mg in sodium chloride 0.9 % 500 mL IVPB     1,500 mg 250 mL/hr over 120 Minutes Intravenous  Once 04/18/18 1244 04/18/18 1910          Objective:   Vitals:   04/21/18 0044 04/21/18 0400 04/21/18 0830 04/21/18 0856  BP: 113/65   136/64  Pulse:    82  Resp:  (!) 23  18  Temp: 97.9 F (36.6 C) 98.4 F (36.9 C) 98.7 F (37.1 C) 97.9 F (36.6 C)  TempSrc: Axillary Oral Oral Oral  SpO2: 98% 99%  98%  Weight:      Height:        Wt Readings from Last 3 Encounters:  04/19/18 79.4 kg  04/16/18 77.5 kg  04/10/18 77.6 kg     Intake/Output Summary (Last 24 hours) at 04/21/2018 0924 Last data filed at 04/21/2018 0700 Gross per 24 hour  Intake 481.59 ml  Output 1030 ml  Net -548.41 ml     Physical Exam  Awake Alert, Oriented X 3, No new F.N deficits, Normal affect [place].AT,PERRAL Supple Neck,No JVD, No cervical lymphadenopathy appriciated.  Symmetrical Chest wall movement, Good air movement bilaterally, CTAB RRR,No Gallops, Rubs, mild aortic systolic murmur, No Parasternal Heave +ve B.Sounds, Abd Soft, No tenderness, No organomegaly appriciated, No rebound - guarding or rigidity. No Cyanosis, Clubbing or edema, No new Rash or bruise     Data Review:    CBC  Recent Labs  Lab 04/16/18 1837 04/17/18 0553 04/18/18 1219 04/19/18 0742 04/20/18 0633 04/21/18 0647  WBC 11.5* 8.2 8.3 8.5 9.3 6.8  HGB 9.3* 7.5* 9.6* 9.2* 9.0* 8.8*  HCT 28.9* 23.7* 29.9* 27.8* 27.9* 27.0*  PLT 190 146* 149* 138* 142* 144*  MCV 90.3 90.8 88.2 88.0 87.2 87.1  MCH 29.1 28.7 28.3 29.1 28.1 28.4  MCHC 32.2 31.6 32.1 33.1 32.3 32.6  RDW 14.5 14.7 15.2 15.2 15.1 15.2  LYMPHSABS 0.8  --  0.6*  --   --   --   MONOABS 1.0  --  0.6  --   --   --   EOSABS 0.0  --  0.0  --   --   --    BASOSABS 0.0  --  0.0  --   --   --     Chemistries  Recent Labs  Lab 04/16/18 1837 04/17/18 0553 04/18/18 1219 04/18/18 1551 04/19/18 0742 04/20/18 0633 04/21/18 0647  NA 133* 138 138  --  140 136 136  K 3.7 3.4* 3.0*  --  3.3* 3.3* 3.4*  CL 100 109 109  --  111 106  104  CO2 24 23 20*  --  21* 21* 23  GLUCOSE 200* 118* 174*  --  109* 116* 108*  BUN 21 18 21   --  21 18 13   CREATININE 1.14 0.99 1.39*  --  1.23 1.14 1.01  CALCIUM 8.9 8.0* 8.1*  --  8.1* 7.8* 7.8*  MG  --   --   --  1.6* 2.1  --  1.8  AST 26  --  38  --  42*  --   --   ALT 20  --  20  --  19  --   --   ALKPHOS 80  --  70  --  55  --   --   BILITOT 0.9  --  1.2  --  0.9  --   --    ------------------------------------------------------------------------------------------------------------------ Recent Labs    04/19/18 0205  CHOL 69  HDL 16*  LDLCALC 39  TRIG 70  CHOLHDL 4.3    Lab Results  Component Value Date   HGBA1C 5.5 04/19/2018   ------------------------------------------------------------------------------------------------------------------ No results for input(s): TSH, T4TOTAL, T3FREE, THYROIDAB in the last 72 hours.  Invalid input(s): FREET3 ------------------------------------------------------------------------------------------------------------------ Recent Labs    04/19/18 1052  VITAMINB12 229  FOLATE 19.7  FERRITIN 349*  TIBC 192*  IRON 82  RETICCTPCT 1.3    Coagulation profile  Recent Labs  Lab 04/18/18 1219  INR 1.14    No results for input(s): DDIMER in the last 72 hours.  Cardiac Enzymes Recent Labs  Lab 04/18/18 1551 04/18/18 1859 04/18/18 2201  TROPONINI 4.48* 4.01* 3.59*   ------------------------------------------------------------------------------------------------------------------    Component Value Date/Time   BNP 553.6 (H) 04/19/2018 0205    Micro Results Recent Results (from the past 240 hour(s))  Blood Culture (routine x 2)     Status:  Abnormal   Collection Time: 04/16/18  6:30 PM  Result Value Ref Range Status   Specimen Description   Final    BLOOD LEFT WRIST Performed at [facility], [address], [place], [place] [address]    Special Requests   Final    BOTTLES DRAWN AEROBIC AND ANAEROBIC Blood Culture adequate volume Performed at [facility], [address] [place], [place] [address]    Culture  Setup Time   Final    GRAM POSITIVE COCCI AEROBIC , ANAEROBIC Gram Stain Report Called to,Read Back By and Verified With: KEMP @ 1761  [id number] BY HENDERSON L. CRITICAL RESULT CALLED TO, READ BACK BY AND VERIFIED WITH: C KEMP 04/17/18 1728 JDW    Culture (A)  Final    STAPHYLOCOCCUS EPIDERMIDIS SUSCEPTIBILITIES PERFORMED ON PREVIOUS CULTURE WITHIN THE LAST 5 DAYS. Performed at [facility], [place] [address] [place], [place] [address]    Report Status 04/19/2018 FINAL  Final  Blood Culture (routine x 2)     Status: Abnormal   Collection Time: 04/16/18  6:38 PM  Result Value Ref Range Status   Specimen Description   Final    LEFT ANTECUBITAL Performed at [facility], [address] [place], [place] [address]    Special Requests   Final    BOTTLES DRAWN AEROBIC AND ANAEROBIC Blood Culture adequate volume Performed at [facility], [address], [place], [place] [address]    Culture  Setup Time   Final    GRAM POSITIVE COCCI ANAEROBIC Gram Stain Report Called to,Read Back By and Verified With: CARDWELL L. @1401  ON [id number] BY HENDERSON L. GRAM POSITIVE COCCI AEROBIC RESULT PREV. CALLED  CRITICAL RESULT CALLED TO, READ BACK BY AND VERIFIED WITH: C KEMP RN 04/17/18 1728 JDW Performed at [facility], [place] [address], [place], [place] [address]    Culture STAPHYLOCOCCUS EPIDERMIDIS (A)  Final   Report Status 04/19/2018 FINAL  Final   Organism ID, Bacteria STAPHYLOCOCCUS EPIDERMIDIS  Final      Susceptibility   Staphylococcus epidermidis - MIC*    CIPROFLOXACIN <=0.5 SENSITIVE Sensitive     ERYTHROMYCIN  >=8 RESISTANT Resistant     GENTAMICIN <=0.5 SENSITIVE Sensitive     OXACILLIN <=0.25 SENSITIVE Sensitive     TETRACYCLINE <=1 SENSITIVE Sensitive     VANCOMYCIN 1 SENSITIVE Sensitive     TRIMETH/SULFA <=10 SENSITIVE Sensitive     CLINDAMYCIN <=0.25 SENSITIVE Sensitive     RIFAMPIN <=0.5 SENSITIVE Sensitive     Inducible Clindamycin NEGATIVE Sensitive     * STAPHYLOCOCCUS EPIDERMIDIS  Blood Culture ID Panel (Reflexed)     Status: Abnormal   Collection Time: 04/16/18  6:38 PM  Result Value Ref Range Status   Enterococcus species NOT DETECTED NOT DETECTED Final   Listeria monocytogenes NOT DETECTED NOT DETECTED Final   Staphylococcus species DETECTED (A) NOT DETECTED Final    Comment: Methicillin (oxacillin) susceptible coagulase negative staphylococcus. Possible blood culture contaminant (unless isolated from more than one blood culture draw or clinical case suggests pathogenicity). No antibiotic treatment is indicated for blood  culture contaminants. CRITICAL RESULT CALLED TO, READ BACK BY AND VERIFIED WITH: C KEMP RN ([facility]) 04/17/18 1728 JDW    Staphylococcus aureus (BCID) NOT DETECTED NOT DETECTED Final   Methicillin resistance NOT DETECTED NOT DETECTED Final   Streptococcus species NOT DETECTED NOT DETECTED Final   Streptococcus agalactiae NOT DETECTED NOT DETECTED Final   Streptococcus pneumoniae NOT DETECTED NOT DETECTED Final   Streptococcus pyogenes NOT DETECTED NOT DETECTED Final   Acinetobacter baumannii NOT DETECTED NOT DETECTED Final   Enterobacteriaceae species NOT DETECTED NOT DETECTED Final   Enterobacter cloacae complex NOT DETECTED NOT DETECTED Final   Escherichia coli NOT DETECTED NOT DETECTED Final   Klebsiella oxytoca NOT DETECTED NOT DETECTED Final   Klebsiella pneumoniae NOT DETECTED NOT DETECTED Final   Proteus species NOT DETECTED NOT DETECTED Final   Serratia marcescens NOT DETECTED NOT DETECTED Final   Haemophilus influenzae NOT DETECTED NOT DETECTED Final    Neisseria meningitidis NOT DETECTED NOT DETECTED Final   Pseudomonas aeruginosa NOT DETECTED NOT DETECTED Final   Candida albicans NOT DETECTED NOT DETECTED Final   Candida glabrata NOT DETECTED NOT DETECTED Final   Candida krusei NOT DETECTED NOT DETECTED Final   Candida parapsilosis NOT DETECTED NOT DETECTED Final   Candida tropicalis NOT DETECTED NOT DETECTED Final    Comment: Performed at [facility], [place] [address] [place], [place] [address]  Culture, Urine     Status: None   Collection Time: 04/16/18  6:45 PM  Result Value Ref Range Status   Specimen Description URINE, CLEAN CATCH  Final   Special Requests   Final    NONE Performed at [facility], [address], [place], [place] [address]    Culture NO GROWTH  Final   Report Status 04/18/2018 FINAL  Final  Culture, blood (Routine x 2)     Status: None (Preliminary result)   Collection Time: 04/18/18 12:21 PM  Result Value Ref Range Status   Specimen Description   Final    BLOOD RIGHT ARM BOTTLES DRAWN AEROBIC AND ANAEROBIC   Special Requests  Blood Culture adequate volume  Final   Culture   Final    NO GROWTH 3 DAYS Performed at [facility], [address] [place], [place] [address]    Report Status PENDING  Incomplete  Culture, blood (Routine x 2)     Status: None (Preliminary result)   Collection Time: 04/18/18 12:49 PM  Result Value Ref Range Status   Specimen Description   Final    BLOOD LEFT HAND BOTTLES DRAWN AEROBIC AND ANAEROBIC   Special Requests Blood Culture adequate volume  Final   Culture   Final    NO GROWTH 3 DAYS Performed at [facility], [address] [place], [place] [address]    Report Status PENDING  Incomplete  Culture, Urine     Status: None   Collection Time: 04/18/18  1:22 PM  Result Value Ref Range Status   Specimen Description   Final    URINE, CLEAN CATCH Performed at [facility], [address], [place], [place] [address]    Special Requests   Final    NONE Performed at  [facility], [address] [place], [place] [address]    Culture   Final    NO GROWTH Performed at [facility], [place] [address] [place],  [address]    Report Status 04/20/2018 FINAL  Final  MRSA PCR Screening     Status: None   Collection Time: 04/19/18 12:27 AM  Result Value Ref Range Status   MRSA by PCR NEGATIVE NEGATIVE Final    Comment:        The GeneXpert MRSA Assay (FDA approved for NASAL specimens only), is one component of a comprehensive MRSA colonization surveillance program. It is not intended to diagnose MRSA infection nor to guide or monitor treatment for MRSA infections. Performed at [facility], [place] [address] [place],  [address]     Radiology Reports  Dg Chest 2 View  Result Date: 04/16/2018 CLINICAL DATA:  Possible sepsis EXAM: CHEST - 2 VIEW COMPARISON:  01/26/2018 FINDINGS: Post sternotomy changes. Mild cardiomegaly with aortic atherosclerosis. Linear scarring or atelectasis at the left base. Degenerative changes of the spine. No pneumothorax. IMPRESSION: Scarring at the left lung base.  Mild cardiomegaly. Electronically Signed   By: Donavan Foil M.D.   On: 04/16/2018 19:31   Ct Head Wo Contrast  Result Date: 04/18/2018 CLINICAL DATA:  Altered mental status beginning 0800 hours today. Fever. EXAM: CT HEAD WITHOUT CONTRAST TECHNIQUE: Contiguous axial images were obtained from the base of the skull through the vertex without intravenous contrast. COMPARISON:  MRI 01/26/2018 FINDINGS: Brain: Chronic brain atrophy. Chronic small-vessel ischemic changes of the cerebral hemispheric white matter. Chronic ventriculomegaly presumed secondary to central atrophy. No sign of acute infarction, mass lesion, change in ventricular size or extra-axial collection. Vascular: There is atherosclerotic calcification of the major vessels at the base of the brain. Skull: Negative Sinuses/Orbits: Clear/normal Other: None IMPRESSION: No acute finding by CT.  Atrophy and chronic small-vessel ischemic changes. Chronic ventriculomegaly felt consistent with central atrophy. Electronically Signed   By: Nelson Chimes M.D.   On: 04/18/2018 16:24   Dg Chest Port 1 View  Result Date: 04/18/2018 CLINICAL DATA:  Rales at right lung base. EXAM: PORTABLE CHEST 1 VIEW COMPARISON:  04/16/2018 FINDINGS: Mild cardiac enlargement and aortic atherosclerosis. There is diffuse pulmonary vascular congestion there is asymmetric opacification of the left midlung and left base. IMPRESSION: 1. Pulmonary vascular congestion with asymmetric increased opacification of the left  midlung and left base. Electronically Signed   By: Kerby Moors M.D.   On: 04/18/2018 13:18   [place] Ekg Site Rite  Result Date: 04/21/2018 If Site Rite image not attached, placement could not be confirmed due to current cardiac rhythm.   Time Spent in minutes  30   Lala Lund M.D on 04/21/2018 at 9:24 AM  To page go to [email or web address] - password [facility]

## 2018-04-21 NOTE — Progress Notes (Signed)
PHARMACY CONSULT NOTE FOR:  OUTPATIENT  PARENTERAL ANTIBIOTIC THERAPY (OPAT)  Indication: MSSE bacteremia  Regimen: Cefazolin 2gm IV q8h  End date: 05/02/2018  IV antibiotic discharge orders are pended. To discharging provider:  please sign these orders via discharge navigator,  Select New Orders & click on the button choice - Manage This Unsigned Work.     Thank you for allowing pharmacy to be a part of this patient's care.  Harrietta Guardian, PharmD PGY1 Pharmacy Resident 04/21/2018    1:08 PM Please check AMION for all [place] numbers

## 2018-04-21 NOTE — Progress Notes (Addendum)
  Echocardiogram Echocardiogram Transesophageal has been performed.  [name] 04/21/2018, 11:16 AM

## 2018-04-21 NOTE — Progress Notes (Signed)
Spoke with pharmacy concerning heparin gtt being off a couple of hours due to patient pulling PIV's out. IV team was able to get 2 more PIV's in and medications were administered late. Gauze wraps are over PIV sites to help prevent patient from pulling them out again.  Next dose antibiotic times were adjusted. Pharmacy rescheduled heparin level.

## 2018-04-22 ENCOUNTER — Ambulatory Visit: Payer: Medicare HMO | Admitting: Cardiology

## 2018-04-22 DIAGNOSIS — A4101 Sepsis due to Methicillin susceptible Staphylococcus aureus: Secondary | ICD-10-CM

## 2018-04-22 LAB — BLOOD CULTURE ID PANEL (REFLEXED)
ACINETOBACTER BAUMANNII: NOT DETECTED
CANDIDA KRUSEI: NOT DETECTED
Candida albicans: NOT DETECTED
Candida glabrata: NOT DETECTED
Candida parapsilosis: NOT DETECTED
Candida tropicalis: NOT DETECTED
Enterobacter cloacae complex: NOT DETECTED
Enterobacteriaceae species: NOT DETECTED
Enterococcus species: NOT DETECTED
Escherichia coli: NOT DETECTED
Haemophilus influenzae: NOT DETECTED
Klebsiella oxytoca: NOT DETECTED
Klebsiella pneumoniae: NOT DETECTED
Listeria monocytogenes: NOT DETECTED
Methicillin resistance: NOT DETECTED
Neisseria meningitidis: NOT DETECTED
Proteus species: NOT DETECTED
Pseudomonas aeruginosa: NOT DETECTED
SERRATIA MARCESCENS: NOT DETECTED
STREPTOCOCCUS AGALACTIAE: NOT DETECTED
Staphylococcus aureus (BCID): NOT DETECTED
Staphylococcus species: DETECTED — AB
Streptococcus pneumoniae: NOT DETECTED
Streptococcus pyogenes: NOT DETECTED
Streptococcus species: NOT DETECTED

## 2018-04-22 LAB — GLUCOSE, CAPILLARY
GLUCOSE-CAPILLARY: 90 mg/dL (ref 70–99)
Glucose-Capillary: 101 mg/dL — ABNORMAL HIGH (ref 70–99)
Glucose-Capillary: 108 mg/dL — ABNORMAL HIGH (ref 70–99)

## 2018-04-22 MED ORDER — SODIUM CHLORIDE 0.9% FLUSH: 10.0000 mL | INTRAVENOUS | Status: DC | PRN

## 2018-04-22 MED ORDER — CEFAZOLIN IV (FOR PTA / DISCHARGE USE ONLY): 2.0000 g | Freq: Three times a day (TID) | INTRAVENOUS | 0 refills | Status: AC

## 2018-04-22 NOTE — Care Management Note (Addendum)
Case Management Note  Patient Details  Name: [name] MRN: [id number] Date of Birth: 24-Apr-1932  Subjective/Objective:                    Action/Plan:  Spoke w Carolynn Sayers [facility] to notify that patient will DC today after PICC line placed. Referral previously made to [facility] for [facility] RN. Pam will see patient prior to DC for teaching.  Spoke w patient's daughter. She states that his three daughters will be providing 24/7 supervision and assistance with IV abx administration. Daughter will be coming in at 12:30 to provide transport home. Pam notified so visit can be coordinated and she can receive education on line care. Daughter states that patient has all needed DME at home, RW toilet seat, shower seat and grab bars in bathroom.   Expected Discharge Date:  04/22/18               Expected Discharge Plan:  [place]  In-House Referral:  NA  Discharge planning Services  CM Consult  Post Acute Care Choice:  NA Choice offered to:  Patient  DME Arranged:  IV pump/equipment DME Agency:  [place]:  RN [facility] Agency:  [place]  Status of Service:  Completed, signed off  If discussed at [place] of Stay Meetings, dates discussed:    Additional Comments:  Carles Collet, RN 04/22/2018, 11:11 AM

## 2018-04-22 NOTE — Telephone Encounter (Signed)
DWM spoke with [name] today -= 04/22/18.

## 2018-04-22 NOTE — Progress Notes (Signed)
Peripherally Inserted Central Catheter/Midline Placement  The IV Nurse has discussed with the patient and/or persons authorized to consent for the patient, the purpose of this procedure and the potential benefits and risks involved with this procedure.  The benefits include less needle sticks, lab draws from the catheter, and the patient may be discharged home with the catheter. Risks include, but not limited to, infection, bleeding, blood clot (thrombus formation), and puncture of an artery; nerve damage and irregular heartbeat and possibility to perform a PICC exchange if needed/ordered by physician.  Alternatives to this procedure were also discussed.  Bard Power PICC patient education guide, fact sheet on infection prevention and patient information card has been provided to patient /or left at bedside.    PICC/Midline Placement Documentation    Consent obtained with daughter    [name] 04/22/2018, 11:58 AM

## 2018-04-22 NOTE — Evaluation (Signed)
Physical Therapy Evaluation Patient Details Name: [name] MRN: [id number] DOB: 06-10-1932 Today's Date: 04/22/2018   History of Present Illness  85 yo male with onset of sepsis for bacteremia with AMS and concern for cardiac vegetations was cleared by TEE.  He was given dx of demand ischemia instead of NSTEMI, PICC line placed and released by cardiology for home.  Family has agreed to take pt home with 24/7 care.  PMHx:  CAD, BPH, CAD, DM, CVA, aortic valve replacement, CABG with AVR,  Clinical Impression  Pt was assessed for gait with HHA only as he declines to use an AD.  He is less safe than ideal, would benefit from RW use.  Has a RW at home and will be cared for by his daughters, who hopefully will be able to convince him to use the walker.  Otherwise recommend HHPT if pt will agree, and this will allow them to strengthen him in a more comfortable setting for him.  Follow acutely as able for gait and strengthening to decrease fall risk.    Follow Up Recommendations Home health PT;Supervision for mobility/OOB    Equipment Recommendations  None recommended by PT(has RW for home)    Recommendations for Other Services       Precautions / Restrictions Precautions Precautions: Fall Precaution Comments: telemetry Restrictions Weight Bearing Restrictions: No      Mobility  Bed Mobility Overal bed mobility: Modified Independent                Transfers Overall transfer level: Modified independent Equipment used: 1 person hand held assist(wall bars)             General transfer comment: transfer in BR with wall rails  Ambulation/Gait Ambulation/Gait assistance: Min guard;Min assist Gait Distance (Feet): 100 Feet Assistive device: 1 person hand held assist Gait Pattern/deviations: Step-through pattern;Step-to pattern;Decreased stride length;Wide base of support;Drifts right/left Gait velocity: redcued Gait velocity interpretation: <1.8 ft/sec, indicate of risk  for recurrent falls General Gait Details: requires cues to use wall bar in hall or hold PT hand  Stairs            Wheelchair Mobility    Modified Rankin (Stroke Patients Only)       Balance Overall balance assessment: History of Falls;Needs assistance Sitting-balance support: Feet supported Sitting balance-Leahy Scale: Good     Standing balance support: Bilateral upper extremity supported;During functional activity;Single extremity supported Standing balance-Leahy Scale: Fair Standing balance comment: poor dynamically, would be better with RW                             Pertinent Vitals/Pain Pain Assessment: No/denies pain    Home Living Family/patient expects to be discharged to:: Private residence Living Arrangements: Alone Available Help at Discharge: Family;Available 24 hours/day Type of Home: House Home Access: Stairs to enter Entrance Stairs-Rails: None Entrance Stairs-Number of Steps: 1 Home Layout: Two level;Able to live on main level with bedroom/bathroom Home Equipment: Walker - 4 wheels;Cane - single point;Walker - 2 wheels Additional Comments: family is aware that pt is requiring assistance of walker    Prior Function Level of Independence: Independent(but has fallen)               Hand Dominance   Dominant Hand: Right    Extremity/Trunk Assessment   Upper Extremity Assessment Upper Extremity Assessment: Overall WFL for tasks assessed    Lower Extremity Assessment Lower Extremity Assessment: Generalized weakness  Cervical / Trunk Assessment Cervical / Trunk Assessment: Kyphotic  Communication   Communication: No difficulties  Cognition Arousal/Alertness: Awake/alert Behavior During Therapy: Impulsive Overall Cognitive Status: Impaired/Different from baseline Area of Impairment: Memory;Attention;Safety/judgement;Following commands;Awareness;Problem solving                   Current Attention Level:  Selective Memory: Decreased short-term memory;Decreased recall of precautions Following Commands: Follows one step commands inconsistently;Follows one step commands with increased time Safety/Judgement: Decreased awareness of deficits;Decreased awareness of safety Awareness: Intellectual Problem Solving: Slow processing;Requires verbal cues;Requires tactile cues        General Comments      Exercises     Assessment/Plan    PT Assessment Patient needs continued PT services  PT Problem List Decreased strength;Decreased range of motion;Decreased activity tolerance;Decreased balance;Decreased mobility;Decreased coordination;Decreased knowledge of use of DME;Decreased cognition;Decreased safety awareness;Decreased knowledge of precautions;Cardiopulmonary status limiting activity       PT Treatment Interventions DME instruction;Gait training;Stair training;Functional mobility training;Therapeutic activities;Therapeutic exercise;Balance training;Neuromuscular re-education;Patient/family education    PT Goals (Current goals can be found in the Care Plan section)  Acute Rehab PT Goals Patient Stated Goal: none stated x to go home soon PT Goal Formulation: Patient unable to participate in goal setting Time For Goal Achievement: 05/06/18 Potential to Achieve Goals: Good    Frequency Min 3X/week   Barriers to discharge Inaccessible home environment stairs in home environment    Co-evaluation               AM-PAC PT "6 Clicks" Mobility  Outcome Measure Help needed turning from your back to your side while in a flat bed without using bedrails?: None Help needed moving from lying on your back to sitting on the side of a flat bed without using bedrails?: A Little Help needed moving to and from a bed to a chair (including a wheelchair)?: A Little Help needed standing up from a chair using your arms (e.g., wheelchair or bedside chair)?: A Little Help needed to walk in hospital room?:  A Little Help needed climbing 3-5 steps with a railing? : A Lot 6 Click Score: 18    End of Session Equipment Utilized During Treatment: Other (comment)(refused gait belt) Activity Tolerance: Patient limited by fatigue;Treatment limited secondary to medical complications (Comment) Patient left: in bed;with call bell/phone within reach;with bed alarm set;with nursing/sitter in room Nurse Communication: Mobility status PT Visit Diagnosis: Unsteadiness on feet (R26.81);Muscle weakness (generalized) (M62.81);Difficulty in walking, not elsewhere classified (R26.2)    Time: 6314-9702 PT Time Calculation (min) (ACUTE ONLY): 23 min   Charges:   PT Evaluation $PT Eval Moderate Complexity: 1 Mod         Ramond Dial 04/22/2018, 2:12 PM  Mee Hives, PT MS Acute Rehab Dept. Number: [facility]

## 2018-04-22 NOTE — Progress Notes (Signed)
Discharge teaching complete. Meds, diet, activity, follow up appointments reviewed and all questions answered. Copy of instructions given to daughter.

## 2018-04-22 NOTE — Discharge Summary (Signed)
Physician Discharge Summary  DEVLIN BRINK XBD:[id number] DOB: Aug 01, 1932 DOA: 04/18/2018  PCP: Chipper Herb, MD  Admit date: 04/18/2018 Discharge date: 04/22/2018  Admitted From: Home Disposition:  Home  Recommendations for Outpatient Follow-up:  1. Follow up with PCP in 1 week 2. Follow-up with ID and cardiology as an outpatient 3. Follow-up in the ED if symptoms worsen or new appear   Home Health: No Equipment/Devices: PICC line to be placed today on 04/22/2018 Discharge Condition: Stable CODE STATUS: DNR Diet recommendation: Heart Healthy / Carb Modified Brief/Interim Summary: 85 year old male with history of hypertension, AV replacement, BPH, CAD, diabetes mellitus type 2, CVA was brought to the [facility] ED with complaints of confusion and fevers.  Blood cultures grew staph epidermidis and 3 out of 4 bottles, he was started on antibiotics.  Troponins trended up and he was transferred to [facility].  Cardiology and ID were consulted.  Patient had a TEE which was negative for vegetations.  He is currently on IV Ancef and once the PICC line is placed, he will be discharged home on IV Ancef.  Cardiology has cleared the patient for discharge with outpatient follow-up for his positive troponins.  Discharge Diagnoses:  Principal Problem:   Sepsis ([place]) Active Problems:   Aortic valve disorder   MV CAD - s/p CABG with AVR   H/O aortic valve replacement   Type 2 diabetes mellitus ([place])   AMS (altered mental status)   Demand ischemia of myocardium (HCC)   Bacteremia  Sepsis -From bacteremia.  Resolved.  Hemodynamically currently stable.  Antibiotic plan as below  MSSE bacteremia -Repeat cultures negative.  TEE negative for vegetation.  PICC line to be placed today.  Continue IV Ancef for 2 weeks, stop date is 05/02/2018.  Outpatient follow-up with ID with repeat blood work.  Currently afebrile.  Non-STEMI in a patient with AVR and CAD with history of CABG in  2012 -Cardiology evaluated the patient.  No chest pain.  Treated with heparin drip for 48 hours which was stopped on 04/21/2018.  Continue statin and aspirin.  Cardiology recommended outpatient follow-up once current infection has been treated.  Diabetes mellitus type 2 -Recent A1c was 6.5.  Resume  metformin on discharge.  Outpatient follow-up  Severe dementia and depression -outpatient follow-up.  Continue Lexapro  History of iron deficiency anemia -Hemoglobin stable.  Outpatient follow-up.  Discharge Instructions  Discharge Instructions    AMB Referral to [place] Management   Complete by:  As directed    Please assign to [facility] for complex case management. Unplanned readmission risk score 32% (extreme). Mult co- morbidities and hospitalizations. Currently at [facility]. Written consent obtained. PCP office ([place]) listed as doing toc.Please call with questions. Thanks. Marthenia Rolling, [place], [facility] [phone]   Reason for consult:  Please assign to [place]   Diagnoses of:   Diabetes Other     Other Diagnosis:  HTN   Expected date of contact:  1-3 days (reserved for hospital discharges)   AMB Referral to [place] [facility] Management   Complete by:  As directed    Please also assign to [employer] Worker for transportation and meal needs per daughter's request. Previously assigned to [facility] for follow up. [name] daughter/POA to be contacted for post hospital discharge calls [phone]. Written consent was obtained. Please call with questions. Thanks.Marthenia Rolling, MSN-Ed, [facility] [phone]   Reason for consult:  Please assign to  [facility] Social Worker   Diagnoses of:   Diabetes Other     Other Diagnosis:  htn   Expected date of contact:  1-3 days (reserved for hospital discharges)   Call MD for:  difficulty breathing, headache or visual disturbances   Complete by:  As  directed    Call MD for:  extreme fatigue   Complete by:  As directed    Call MD for:  hives   Complete by:  As directed    Call MD for:  persistant dizziness or light-headedness   Complete by:  As directed    Call MD for:  persistant nausea and vomiting   Complete by:  As directed    Call MD for:  severe uncontrolled pain   Complete by:  As directed    Call MD for:  temperature >100.4   Complete by:  As directed    Diet - low sodium heart healthy   Complete by:  As directed    Diet Carb Modified   Complete by:  As directed    Home infusion instructions [place] May follow [place] Dosing Protocol; May administer Cathflo as needed to maintain patency of vascular access device.; Flushing of vascular access device: per [facility] Protocol: 0.9% NaCl pre/post medica...   Complete by:  As directed    Instructions:  May follow [place] Dosing Protocol   Instructions:  May administer Cathflo as needed to maintain patency of vascular access device.   Instructions:  Flushing of vascular access device: per [facility] Protocol: 0.9% NaCl pre/post medication administration and prn patency; Heparin 100 u/ml, 11m for implanted ports and Heparin 10u/ml, 568mfor all other central venous catheters.   Instructions:  May follow [facility] Anaphylaxis Protocol for First Dose Administration in the home: 0.9% NaCl at 25-50 ml/hr to maintain IV access for protocol meds. Epinephrine 0.3 ml IV/IM PRN and Benadryl 25-50 IV/IM PRN s/s of anaphylaxis.   Instructions:  [facility] Coordinator (RN) to assist per patient IV care needs in the home PRN.   Increase activity slowly   Complete by:  As directed      Allergies as of 04/22/2018   No Known Allergies     Medication List    TAKE these medications   ACCU-CHEK SOFTCLIX LANCETS lancets Use as instructed   aspirin 81 MG tablet Take 1 tablet (81 mg total) by mouth every morning.   ceFAZolin  IVPB Commonly known as:  ANCEF Inject 2 g into  the vein every 8 (eight) hours for 11 days. Indication:  MSSE Bacteremia Last Day of Therapy:  05/02/2018 Labs - Once weekly:  CBC/D and BMP, Labs - Every other week:  ESR and CRP   escitalopram 20 MG tablet Commonly known as:  LEXAPRO Take 20 mg by mouth daily.   furosemide 20 MG tablet Commonly known as:  LASIX Take 1 tablet (20 mg total) by mouth daily. Take 40 mg on Mon, Wed, Fri and 20 mg all other days.   glucose blood test strip CHECK BLOOD SUGAR UP TO 4 TIMES A DAY   metFORMIN 500 MG tablet Commonly known as:  GLUCOPHAGE Take 1 tablet (500 mg total) by mouth 2 (two) times daily.   metoprolol succinate 25 MG 24 hr tablet Commonly known as:  TOPROL-XL Take 0.5 tablets (12.5 mg total) by mouth daily before breakfast.   nitroGLYCERIN 0.4 MG SL tablet Commonly known as:  NITROSTAT Place 1 tablet (0.4 mg total) under the  tongue every 5 (five) minutes x 3 doses as needed for chest pain.   pantoprazole 40 MG tablet Commonly known as:  PROTONIX TAKE (1) TABLET TWICE A DAY BEFOR A MEAL   polyethylene glycol packet Commonly known as:  MIRALAX / GLYCOLAX Take 17 g by mouth daily.   risperiDONE 0.25 MG tablet Commonly known as:  RISPERDAL Take 1 tablet (0.25 mg total) by mouth 3 (three) times daily.   rosuvastatin 40 MG tablet Commonly known as:  CRESTOR Take 1 tablet (40 mg total) by mouth every evening.            Home Infusion Instuctions  (From admission, onward)         Start     Ordered   04/22/18 0000  Home infusion instructions [facility] May follow [place] Dosing Protocol; May administer Cathflo as needed to maintain patency of vascular access device.; Flushing of vascular access device: per [facility] Protocol: 0.9% NaCl pre/post medica...    Question Answer Comment  Instructions May follow [place] Dosing Protocol   Instructions May administer Cathflo as needed to maintain patency of vascular access device.   Instructions Flushing of vascular  access device: per [facility] Protocol: 0.9% NaCl pre/post medication administration and prn patency; Heparin 100 u/ml, 39m for implanted ports and Heparin 10u/ml, 560mfor all other central venous catheters.   Instructions May follow [facility] Anaphylaxis Protocol for First Dose Administration in the home: 0.9% NaCl at 25-50 ml/hr to maintain IV access for protocol meds. Epinephrine 0.3 ml IV/IM PRN and Benadryl 25-50 IV/IM PRN s/s of anaphylaxis.   Instructions [facility] Infusion Coordinator (RN) to assist per patient IV care needs in the home PRN.      04/22/18 0936         Follow-up Information    Comer, RoOkey RegalMD Follow up on 05/12/2018.   Specialty:  Infectious Diseases Why:  11:00 am appointment. Please arrive 15 minutes early to register.  Contact information: [id number]      BaLonn GeorgiaPA-C Follow up on 04/28/2018.   Specialties:  Cardiology, Radiology Why:  at 0820am Contact information: [address]TBayou [phone]        MoChipper HerbMD. Schedule an appointment as soon as possible for a visit in 1 week(s).   Specialty:  Family Medicine Contact information: [phone]        HoMinus BreedingMD .   Specialty:  Cardiology Contact information: [address]TMc[phone]        No Known Allergies  Consultations:  ID/cardiology   Procedures/Studies: Dg Chest 2 View  Result Date: 04/16/2018 CLINICAL DATA:  Possible sepsis EXAM: CHEST - 2 VIEW COMPARISON:  01/26/2018 FINDINGS: Post sternotomy changes. Mild cardiomegaly with aortic atherosclerosis. Linear scarring or atelectasis at the left base. Degenerative changes of the spine. No pneumothorax. IMPRESSION: Scarring at the left lung base.  Mild cardiomegaly. Electronically Signed   By: KiDonavan Foil.D.   On: 04/16/2018 19:31   Ct  Head Wo Contrast  Result Date: 04/18/2018 CLINICAL DATA:  Altered mental status beginning 0800 hours today. Fever. EXAM: CT HEAD WITHOUT CONTRAST TECHNIQUE: Contiguous axial images were obtained from the base of the skull through the vertex without intravenous contrast. COMPARISON:  MRI 01/26/2018 FINDINGS: Brain: Chronic brain atrophy. Chronic small-vessel ischemic  changes of the cerebral hemispheric white matter. Chronic ventriculomegaly presumed secondary to central atrophy. No sign of acute infarction, mass lesion, change in ventricular size or extra-axial collection. Vascular: There is atherosclerotic calcification of the major vessels at the base of the brain. Skull: Negative Sinuses/Orbits: Clear/normal Other: None IMPRESSION: No acute finding by CT. Atrophy and chronic small-vessel ischemic changes. Chronic ventriculomegaly felt consistent with central atrophy. Electronically Signed   By: Nelson Chimes M.D.   On: 04/18/2018 16:24   Dg Chest Port 1 View  Result Date: 04/18/2018 CLINICAL DATA:  Rales at right lung base. EXAM: PORTABLE CHEST 1 VIEW COMPARISON:  04/16/2018 FINDINGS: Mild cardiac enlargement and aortic atherosclerosis. There is diffuse pulmonary vascular congestion there is asymmetric opacification of the left midlung and left base. IMPRESSION: 1. Pulmonary vascular congestion with asymmetric increased opacification of the left midlung and left base. Electronically Signed   By: Kerby Moors M.D.   On: 04/18/2018 13:18   [place] Ekg Site Rite  Result Date: 04/21/2018 If Site Rite image not attached, placement could not be confirmed due to current cardiac rhythm.   TEE on 04/21/2018 Left Ventrical:  Mild LV dyfunction .   Heart is rotated so assessment is difficult -   EF 45%   Mitral Valve: mild MS,  Mild MR , no vegetation   Aortic Valve: bioprosthetic AV.   Mild thickening .  No obvious vegetatoin   Tricuspid Valve: mild TR,  No vegetation   Pulmonic Valve: no vegetation.    Not well visualized   Left Atrium/ Left atrial appendage:  No LAA thrombi   Atrial septum: intact by color doppler   Aorta: mild calcification   Subjective: Patient seen and examined at bedside.  Sitting on chair, wants to go home.  No overnight fever, nausea or vomiting or chest pain.  He is a poor historian.  Discharge Exam: Vitals:   04/21/18 2202 04/22/18 0613  BP: 125/71 119/66  Pulse: 75 83  Resp:    Temp: 98.1 F (36.7 C) 98 F (36.7 C)  SpO2: 100% 98%   Vitals:   04/21/18 1055 04/21/18 1221 04/21/18 2202 04/22/18 0613  BP: (!) 109/57  125/71 119/66  Pulse: 78  75 83  Resp: 20     Temp:  98.2 F (36.8 C) 98.1 F (36.7 C) 98 F (36.7 C)  TempSrc:  Oral Oral   SpO2: 98%  100% 98%  Weight:      Height:        General: Pt is alert, awake, not in acute distress.  Poor historian Cardiovascular: rate controlled, S1/S2 + Respiratory: bilateral decreased breath sounds at bases, some scattered crackles Abdominal: Soft, NT, ND, bowel sounds + Extremities: no edema, no cyanosis    The results of significant diagnostics from this hospitalization (including imaging, microbiology, ancillary and laboratory) are listed below for reference.     Microbiology: Recent Results (from the past 240 hour(s))  Blood Culture (routine x 2)     Status: Abnormal   Collection Time: 04/16/18  6:30 PM  Result Value Ref Range Status   Specimen Description   Final    BLOOD LEFT WRIST Performed at [facility], [address], [place], [place] [address]    Special Requests   Final    BOTTLES DRAWN AEROBIC AND ANAEROBIC Blood Culture adequate volume Performed at [facility], [address], [place],  [address]    Culture  Setup Time   Final    [facility] POSITIVE COCCI  AEROBIC , ANAEROBIC Gram Stain Report Called to,Read Back By and Verified With: KEMP @ 1694  [id number] BY HENDERSON L. CRITICAL RESULT CALLED TO, READ BACK BY AND VERIFIED WITH: C KEMP 04/17/18 1728 JDW    Culture (A)   Final    STAPHYLOCOCCUS EPIDERMIDIS SUSCEPTIBILITIES PERFORMED ON PREVIOUS CULTURE WITHIN THE LAST 5 DAYS. Performed at [facility], [place] [address], [place], [place] [address]    Report Status 04/19/2018 FINAL  Final  Blood Culture (routine x 2)     Status: Abnormal   Collection Time: 04/16/18  6:38 PM  Result Value Ref Range Status   Specimen Description   Final    LEFT ANTECUBITAL Performed at [facility], [address] [place], [place] [address]    Special Requests   Final    BOTTLES DRAWN AEROBIC AND ANAEROBIC Blood Culture adequate volume Performed at [facility], [address], [place], [place] [address]    Culture  Setup Time   Final    GRAM POSITIVE COCCI ANAEROBIC Gram Stain Report Called to,Read Back By and Verified With: CARDWELL L. '@1401'  ON [id number] BY HENDERSON L. GRAM POSITIVE COCCI AEROBIC RESULT PREV. CALLED CRITICAL RESULT CALLED TO, READ BACK BY AND VERIFIED WITH: C KEMP RN 04/17/18 1728 JDW Performed at [facility], [place] [address], [place], [place] [address]    Culture STAPHYLOCOCCUS EPIDERMIDIS (A)  Final   Report Status 04/19/2018 FINAL  Final   Organism ID, Bacteria STAPHYLOCOCCUS EPIDERMIDIS  Final      Susceptibility   Staphylococcus epidermidis - MIC*    CIPROFLOXACIN <=0.5 SENSITIVE Sensitive     ERYTHROMYCIN >=8 RESISTANT Resistant     GENTAMICIN <=0.5 SENSITIVE Sensitive     OXACILLIN <=0.25 SENSITIVE Sensitive     TETRACYCLINE <=1 SENSITIVE Sensitive     VANCOMYCIN 1 SENSITIVE Sensitive     TRIMETH/SULFA <=10 SENSITIVE Sensitive     CLINDAMYCIN <=0.25 SENSITIVE Sensitive     RIFAMPIN <=0.5 SENSITIVE Sensitive     Inducible Clindamycin NEGATIVE Sensitive     * STAPHYLOCOCCUS EPIDERMIDIS  Blood Culture ID Panel (Reflexed)     Status: Abnormal   Collection Time: 04/16/18  6:38 PM  Result Value Ref Range Status   Enterococcus species NOT DETECTED NOT DETECTED Final   Listeria monocytogenes NOT DETECTED NOT DETECTED Final    Staphylococcus species DETECTED (A) NOT DETECTED Final    Comment: Methicillin (oxacillin) susceptible coagulase negative staphylococcus. Possible blood culture contaminant (unless isolated from more than one blood culture draw or clinical case suggests pathogenicity). No antibiotic treatment is indicated for blood  culture contaminants. CRITICAL RESULT CALLED TO, READ BACK BY AND VERIFIED WITH: C KEMP RN ([facility]) 04/17/18 1728 JDW    Staphylococcus aureus (BCID) NOT DETECTED NOT DETECTED Final   Methicillin resistance NOT DETECTED NOT DETECTED Final   Streptococcus species NOT DETECTED NOT DETECTED Final   Streptococcus agalactiae NOT DETECTED NOT DETECTED Final   Streptococcus pneumoniae NOT DETECTED NOT DETECTED Final   Streptococcus pyogenes NOT DETECTED NOT DETECTED Final   Acinetobacter baumannii NOT DETECTED NOT DETECTED Final   Enterobacteriaceae species NOT DETECTED NOT DETECTED Final   Enterobacter cloacae complex NOT DETECTED NOT DETECTED Final   Escherichia coli NOT DETECTED NOT DETECTED Final   Klebsiella oxytoca NOT DETECTED NOT DETECTED Final   Klebsiella pneumoniae NOT DETECTED NOT DETECTED Final   Proteus species NOT DETECTED NOT DETECTED Final   Serratia marcescens NOT DETECTED NOT DETECTED Final   Haemophilus influenzae NOT DETECTED  NOT DETECTED Final   Neisseria meningitidis NOT DETECTED NOT DETECTED Final   Pseudomonas aeruginosa NOT DETECTED NOT DETECTED Final   Candida albicans NOT DETECTED NOT DETECTED Final   Candida glabrata NOT DETECTED NOT DETECTED Final   Candida krusei NOT DETECTED NOT DETECTED Final   Candida parapsilosis NOT DETECTED NOT DETECTED Final   Candida tropicalis NOT DETECTED NOT DETECTED Final    Comment: Performed at [facility], [place] [address] [place], [place] [address]  Culture, Urine     Status: None   Collection Time: 04/16/18  6:45 PM  Result Value Ref Range Status   Specimen Description URINE, CLEAN CATCH  Final   Special Requests    Final    NONE Performed at [facility], [address], [place], [place] [address]    Culture NO GROWTH  Final   Report Status 04/18/2018 FINAL  Final  Culture, blood (Routine x 2)     Status: None (Preliminary result)   Collection Time: 04/18/18 12:21 PM  Result Value Ref Range Status   Specimen Description   Final    BLOOD RIGHT ARM BOTTLES DRAWN AEROBIC AND ANAEROBIC   Special Requests Blood Culture adequate volume  Final   Culture   Final    NO GROWTH 4 DAYS Performed at [facility], [address] [place], [place] [address]    Report Status PENDING  Incomplete  Culture, blood (Routine x 2)     Status: None (Preliminary result)   Collection Time: 04/18/18 12:49 PM  Result Value Ref Range Status   Specimen Description   Final    BLOOD LEFT HAND BOTTLES DRAWN AEROBIC AND ANAEROBIC   Special Requests Blood Culture adequate volume  Final   Culture  Setup Time   Final    GRAM POSITIVE COCCI IN CLUSTERS Gram Stain Report Called to,Read Back By and Verified With: MCLEAN,S AT 0740 BY HUFFINES,S ON 04/22/18. Performed at [facility], [address], [place], [place] [address]    Culture PENDING  Incomplete   Report Status PENDING  Incomplete  Culture, Urine     Status: None   Collection Time: 04/18/18  1:22 PM  Result Value Ref Range Status   Specimen Description   Final    URINE, CLEAN CATCH Performed at [facility], [address] [place], [place] [address]    Special Requests   Final    NONE Performed at [facility], [address], [place], [place] [address]    Culture   Final    NO GROWTH Performed at [facility], [place] [address], [place], [place] [address]    Report Status 04/20/2018 FINAL  Final  MRSA PCR Screening     Status: None   Collection Time: 04/19/18 12:27 AM  Result Value Ref Range Status   MRSA by PCR NEGATIVE NEGATIVE Final    Comment:        The GeneXpert MRSA Assay (FDA approved for NASAL specimens only), is one component of  a comprehensive MRSA colonization surveillance program. It is not intended to diagnose MRSA infection nor to guide or monitor treatment for MRSA infections. Performed at [facility], [place] [address] [place], [place] [address]      Labs: BNP (last 3 results) Recent Labs    01/06/18 1812 04/02/18 1508 04/19/18 0205  BNP 128.0* 211.1* 676.1*   Basic Metabolic Panel: Recent Labs  Lab 04/17/18 9509 04/18/18 1219 04/18/18 1551 04/19/18 3267 04/20/18 1245 04/21/18 8099  NA 138 138  --  140 136 136  K 3.4* 3.0*  --  3.3* 3.3* 3.4*  CL 109 109  --  111 106 104  CO2 23 20*  --  21* 21* 23  GLUCOSE 118* 174*  --  109* 116* 108*  BUN 18 21  --  '21 18 13  ' CREATININE 0.99 1.39*  --  1.23 1.14 1.01  CALCIUM 8.0* 8.1*  --  8.1* 7.8* 7.8*  MG  --   --  1.6* 2.1  --  1.8   Liver Function Tests: Recent Labs  Lab 04/16/18 1837 04/18/18 1219 04/19/18 0742  AST 26 38 42*  ALT '20 20 19  ' ALKPHOS 80 70 55  BILITOT 0.9 1.2 0.9  PROT 6.5 5.8* 5.0*  ALBUMIN 3.3* 2.7* 2.4*   No results for input(s): LIPASE, AMYLASE in the last 168 hours. No results for input(s): AMMONIA in the last 168 hours. CBC: Recent Labs  Lab 04/16/18 1837 04/17/18 0553 04/18/18 1219 04/19/18 0742 04/20/18 0633 04/21/18 0647  WBC 11.5* 8.2 8.3 8.5 9.3 6.8  NEUTROABS 9.6*  --  7.0  --   --   --   HGB 9.3* 7.5* 9.6* 9.2* 9.0* 8.8*  HCT 28.9* 23.7* 29.9* 27.8* 27.9* 27.0*  MCV 90.3 90.8 88.2 88.0 87.2 87.1  PLT 190 146* 149* 138* 142* 144*   Cardiac Enzymes: Recent Labs  Lab 04/18/18 1551 04/18/18 1859 04/18/18 2201  TROPONINI 4.48* 4.01* 3.59*   BNP: Invalid input(s): POCBNP CBG: Recent Labs  Lab 04/21/18 1220 04/21/18 1753 04/21/18 2018 04/22/18 0021 04/22/18 0611  GLUCAP 112* 127* 139* 90 101*   D-Dimer No results for input(s): DDIMER in the last 72 hours. Hgb A1c No results for input(s): HGBA1C in the last 72 hours. Lipid Profile No results for input(s): CHOL, HDL,  LDLCALC, TRIG, CHOLHDL, LDLDIRECT in the last 72 hours. Thyroid function studies No results for input(s): TSH, T4TOTAL, T3FREE, THYROIDAB in the last 72 hours.  Invalid input(s): FREET3 Anemia work up Recent Labs    04/19/18 1052  VITAMINB12 229  FOLATE 19.7  FERRITIN 349*  TIBC 192*  IRON 82  RETICCTPCT 1.3   Urinalysis    Component Value Date/Time   COLORURINE YELLOW 04/18/2018 1316   APPEARANCEUR CLOUDY (A) 04/18/2018 1316   APPEARANCEUR Clear 07/24/2016 1110   LABSPEC 1.017 04/18/2018 1316   PHURINE 5.0 04/18/2018 1316   GLUCOSEU NEGATIVE 04/18/2018 1316   HGBUR LARGE (A) 04/18/2018 1316   BILIRUBINUR NEGATIVE 04/18/2018 1316   BILIRUBINUR Negative 07/24/2016 1110   KETONESUR 20 (A) 04/18/2018 1316   PROTEINUR 30 (A) 04/18/2018 1316   UROBILINOGEN negative 08/23/2014 1030   UROBILINOGEN 0.2 08/21/2010 1150   NITRITE NEGATIVE 04/18/2018 1316   LEUKOCYTESUR NEGATIVE 04/18/2018 1316   LEUKOCYTESUR Negative 07/24/2016 1110   Sepsis Labs Invalid input(s): PROCALCITONIN,  WBC,  LACTICIDVEN Microbiology Recent Results (from the past 240 hour(s))  Blood Culture (routine x 2)     Status: Abnormal   Collection Time: 04/16/18  6:30 PM  Result Value Ref Range Status   Specimen Description   Final    BLOOD LEFT WRIST Performed at [facility], [address] [place], [place] [address]    Special Requests   Final    BOTTLES DRAWN AEROBIC AND ANAEROBIC Blood Culture adequate volume Performed at [facility], [address] [place], [place] [address]    Culture  Setup Time   Final    GRAM POSITIVE COCCI AEROBIC , ANAEROBIC  Gram Stain Report Called to,Read Back By and Verified With: KEMP @ 8144  [id number] BY HENDERSON L. CRITICAL RESULT CALLED TO, READ BACK BY AND VERIFIED WITH: C KEMP 04/17/18 1728 JDW    Culture (A)  Final    STAPHYLOCOCCUS EPIDERMIDIS SUSCEPTIBILITIES PERFORMED ON PREVIOUS CULTURE WITHIN THE LAST 5 DAYS. Performed at [facility], [place] [address], [place], [place] [address]    Report Status 04/19/2018 FINAL  Final  Blood Culture (routine x 2)     Status: Abnormal   Collection Time: 04/16/18  6:38 PM  Result Value Ref Range Status   Specimen Description   Final    LEFT ANTECUBITAL Performed at [facility], [address] [place], [place] [address]    Special Requests   Final    BOTTLES DRAWN AEROBIC AND ANAEROBIC Blood Culture adequate volume Performed at [facility], [address] [place], [place] [address]    Culture  Setup Time   Final    GRAM POSITIVE COCCI ANAEROBIC Gram Stain Report Called to,Read Back By and Verified With: CARDWELL L. '@[id number]'  ON [id number] BY HENDERSON L. GRAM POSITIVE COCCI AEROBIC RESULT PREV. CALLED CRITICAL RESULT CALLED TO, READ BACK BY AND VERIFIED WITH: C KEMP RN 04/17/18 1728 JDW Performed at [facility], [place] [address], [place],  [address]    Culture STAPHYLOCOCCUS EPIDERMIDIS (A)  Final   Report Status 04/19/2018 FINAL  Final   Organism ID, Bacteria STAPHYLOCOCCUS EPIDERMIDIS  Final      Susceptibility   Staphylococcus epidermidis - MIC*    CIPROFLOXACIN <=0.5 SENSITIVE Sensitive     ERYTHROMYCIN >=8 RESISTANT Resistant     GENTAMICIN <=0.5 SENSITIVE Sensitive     OXACILLIN <=0.25 SENSITIVE Sensitive     TETRACYCLINE <=1 SENSITIVE Sensitive     VANCOMYCIN 1 SENSITIVE Sensitive     TRIMETH/SULFA <=10 SENSITIVE Sensitive     CLINDAMYCIN <=0.25 SENSITIVE Sensitive     RIFAMPIN <=0.5 SENSITIVE Sensitive     Inducible Clindamycin NEGATIVE Sensitive     * STAPHYLOCOCCUS EPIDERMIDIS  Blood Culture ID Panel (Reflexed)     Status: Abnormal   Collection Time: 04/16/18  6:38 PM  Result Value Ref Range Status   Enterococcus species NOT DETECTED NOT DETECTED Final   Listeria monocytogenes NOT DETECTED NOT DETECTED Final   Staphylococcus species DETECTED (A) NOT DETECTED Final    Comment: Methicillin (oxacillin) susceptible coagulase negative staphylococcus. Possible blood culture  contaminant (unless isolated from more than one blood culture draw or clinical case suggests pathogenicity). No antibiotic treatment is indicated for blood  culture contaminants. CRITICAL RESULT CALLED TO, READ BACK BY AND VERIFIED WITH: C KEMP RN ([facility]) 04/17/18 1728 JDW    Staphylococcus aureus (BCID) NOT DETECTED NOT DETECTED Final   Methicillin resistance NOT DETECTED NOT DETECTED Final   Streptococcus species NOT DETECTED NOT DETECTED Final   Streptococcus agalactiae NOT DETECTED NOT DETECTED Final   Streptococcus pneumoniae NOT DETECTED NOT DETECTED Final   Streptococcus pyogenes NOT DETECTED NOT DETECTED Final   Acinetobacter baumannii NOT DETECTED NOT DETECTED Final   Enterobacteriaceae species NOT DETECTED NOT DETECTED Final   Enterobacter cloacae complex NOT DETECTED NOT DETECTED Final   Escherichia coli NOT DETECTED NOT DETECTED Final   Klebsiella oxytoca NOT DETECTED NOT DETECTED Final   Klebsiella pneumoniae NOT DETECTED NOT DETECTED Final   Proteus species NOT DETECTED NOT DETECTED Final   Serratia marcescens NOT DETECTED NOT DETECTED Final   Haemophilus influenzae NOT DETECTED NOT DETECTED Final  Neisseria meningitidis NOT DETECTED NOT DETECTED Final   Pseudomonas aeruginosa NOT DETECTED NOT DETECTED Final   Candida albicans NOT DETECTED NOT DETECTED Final   Candida glabrata NOT DETECTED NOT DETECTED Final   Candida krusei NOT DETECTED NOT DETECTED Final   Candida parapsilosis NOT DETECTED NOT DETECTED Final   Candida tropicalis NOT DETECTED NOT DETECTED Final    Comment: Performed at [facility], [place] [address], [place], [place] [address]  Culture, Urine     Status: None   Collection Time: 04/16/18  6:45 PM  Result Value Ref Range Status   Specimen Description URINE, CLEAN CATCH  Final   Special Requests   Final    NONE Performed at [facility], [address] [place], [place] [address]    Culture NO GROWTH  Final   Report Status 04/18/2018 FINAL  Final   Culture, blood (Routine x 2)     Status: None (Preliminary result)   Collection Time: 04/18/18 12:21 PM  Result Value Ref Range Status   Specimen Description   Final    BLOOD RIGHT ARM BOTTLES DRAWN AEROBIC AND ANAEROBIC   Special Requests Blood Culture adequate volume  Final   Culture   Final    NO GROWTH 4 DAYS Performed at [facility], [address], [place], [place] [address]    Report Status PENDING  Incomplete  Culture, blood (Routine x 2)     Status: None (Preliminary result)   Collection Time: 04/18/18 12:49 PM  Result Value Ref Range Status   Specimen Description   Final    BLOOD LEFT HAND BOTTLES DRAWN AEROBIC AND ANAEROBIC   Special Requests Blood Culture adequate volume  Final   Culture  Setup Time   Final    GRAM POSITIVE COCCI IN CLUSTERS Gram Stain Report Called to,Read Back By and Verified With: MCLEAN,S AT 0740 BY HUFFINES,S ON 04/22/18. Performed at [facility], [address] [place], [place] [address]    Culture PENDING  Incomplete   Report Status PENDING  Incomplete  Culture, Urine     Status: None   Collection Time: 04/18/18  1:22 PM  Result Value Ref Range Status   Specimen Description   Final    URINE, CLEAN CATCH Performed at [facility], [address], [place], [place] [address]    Special Requests   Final    NONE Performed at [facility], [address] [place], [place] [address]    Culture   Final    NO GROWTH Performed at [facility], [place] [address] [place], [place] [address]    Report Status 04/20/2018 FINAL  Final  MRSA PCR Screening     Status: None   Collection Time: 04/19/18 12:27 AM  Result Value Ref Range Status   MRSA by PCR NEGATIVE NEGATIVE Final    Comment:        The GeneXpert MRSA Assay (FDA approved for NASAL specimens only), is one component of a comprehensive MRSA colonization surveillance program. It is not intended to diagnose MRSA infection nor to guide or monitor treatment for MRSA  infections. Performed at [facility], [place] [address] [place], [place] [address]      Time coordinating discharge: 35 minutes  SIGNED:   Aline August, MD  [facility] 04/22/2018, 9:37 AM Pager: [phone]  If 7PM-7AM, please contact night-coverage [email or web address] Password [email or web address]

## 2018-04-22 NOTE — Progress Notes (Signed)
Patient discharged home via wheelchair.  

## 2018-04-22 NOTE — Progress Notes (Signed)
PT Cancellation Note  Patient Details Name: [name] MRN: [id number] DOB: 09/05/1932   Cancelled Treatment:    Reason Eval/Treat Not Completed: Other (comment).  Pt is working on self care and will try back in a later time.   Ramond Dial 04/22/2018, 9:52 AM  Mee Hives, PT MS Acute Rehab Dept. Number: [place] and [place]

## 2018-04-23 ENCOUNTER — Ambulatory Visit (HOSPITAL_COMMUNITY): Payer: Medicare HMO

## 2018-04-23 DIAGNOSIS — E785 Hyperlipidemia, unspecified: Secondary | ICD-10-CM | POA: Diagnosis not present

## 2018-04-23 DIAGNOSIS — I10 Essential (primary) hypertension: Secondary | ICD-10-CM | POA: Diagnosis not present

## 2018-04-23 DIAGNOSIS — E1169 Type 2 diabetes mellitus with other specified complication: Secondary | ICD-10-CM | POA: Diagnosis not present

## 2018-04-23 DIAGNOSIS — A411 Sepsis due to other specified staphylococcus: Secondary | ICD-10-CM | POA: Diagnosis not present

## 2018-04-23 DIAGNOSIS — F329 Major depressive disorder, single episode, unspecified: Secondary | ICD-10-CM | POA: Diagnosis not present

## 2018-04-23 DIAGNOSIS — F0390 Unspecified dementia without behavioral disturbance: Secondary | ICD-10-CM | POA: Diagnosis not present

## 2018-04-23 DIAGNOSIS — I214 Non-ST elevation (NSTEMI) myocardial infarction: Secondary | ICD-10-CM | POA: Diagnosis not present

## 2018-04-23 DIAGNOSIS — I2510 Atherosclerotic heart disease of native coronary artery without angina pectoris: Secondary | ICD-10-CM | POA: Diagnosis not present

## 2018-04-23 DIAGNOSIS — R7881 Bacteremia: Secondary | ICD-10-CM | POA: Diagnosis not present

## 2018-04-23 DIAGNOSIS — D509 Iron deficiency anemia, unspecified: Secondary | ICD-10-CM | POA: Diagnosis not present

## 2018-04-23 LAB — TYPE AND SCREEN
ABO/RH(D): O POS
Antibody Screen: POSITIVE
DAT, IGG: NEGATIVE
Donor AG Type: NEGATIVE
Donor AG Type: NEGATIVE
Donor AG Type: NEGATIVE
Donor AG Type: NEGATIVE
Unit division: 0
Unit division: 0
Unit division: 0
Unit division: 0

## 2018-04-23 LAB — BPAM RBC
BLOOD PRODUCT EXPIRATION DATE: 202002232359
BLOOD PRODUCT EXPIRATION DATE: 202003032359
Blood Product Expiration Date: 202002232359
Blood Product Expiration Date: 202003032359
UNIT TYPE AND RH: 5100
Unit Type and Rh: 5100
Unit Type and Rh: 5100
Unit Type and Rh: 5100

## 2018-04-23 LAB — TRANSFUSION REACTION
DAT C3: NEGATIVE
Post RXN DAT IgG: POSITIVE

## 2018-04-23 LAB — CULTURE, BLOOD (ROUTINE X 2)
Culture: NO GROWTH
Special Requests: ADEQUATE

## 2018-04-24 ENCOUNTER — Encounter (HOSPITAL_COMMUNITY): Payer: Self-pay | Admitting: Cardiovascular Disease

## 2018-04-24 ENCOUNTER — Other Ambulatory Visit: Payer: Self-pay | Admitting: *Deleted

## 2018-04-24 LAB — CULTURE, BLOOD (ROUTINE X 2): Special Requests: ADEQUATE

## 2018-04-24 NOTE — Patient Outreach (Signed)
Referral received from hospital liason, pt hospitalized 1/25-1/29/20 for sepsis related to bacteremia, demand ischemia of myocardium vs. NSTEMI, HTN, CAD, dementia, diabetes type 2, history of falls. Daughter [name] is contact person and POA, RN CM spoke with [place] and screening completed, MD office provides transition of care. [name] feels home visit is appropriate, RN CM scheduled initial home visit for next week.  PLAN See pt for initial home visit next week  [name] [facility], [place] Coordinator [phone]

## 2018-04-27 DIAGNOSIS — E785 Hyperlipidemia, unspecified: Secondary | ICD-10-CM | POA: Diagnosis not present

## 2018-04-27 DIAGNOSIS — D509 Iron deficiency anemia, unspecified: Secondary | ICD-10-CM | POA: Diagnosis not present

## 2018-04-27 DIAGNOSIS — I2510 Atherosclerotic heart disease of native coronary artery without angina pectoris: Secondary | ICD-10-CM | POA: Diagnosis not present

## 2018-04-27 DIAGNOSIS — F0390 Unspecified dementia without behavioral disturbance: Secondary | ICD-10-CM | POA: Diagnosis not present

## 2018-04-27 DIAGNOSIS — E1169 Type 2 diabetes mellitus with other specified complication: Secondary | ICD-10-CM | POA: Diagnosis not present

## 2018-04-27 DIAGNOSIS — I10 Essential (primary) hypertension: Secondary | ICD-10-CM | POA: Diagnosis not present

## 2018-04-27 DIAGNOSIS — F329 Major depressive disorder, single episode, unspecified: Secondary | ICD-10-CM | POA: Diagnosis not present

## 2018-04-27 DIAGNOSIS — Z5181 Encounter for therapeutic drug level monitoring: Secondary | ICD-10-CM | POA: Diagnosis not present

## 2018-04-27 DIAGNOSIS — I214 Non-ST elevation (NSTEMI) myocardial infarction: Secondary | ICD-10-CM | POA: Diagnosis not present

## 2018-04-27 DIAGNOSIS — A411 Sepsis due to other specified staphylococcus: Secondary | ICD-10-CM | POA: Diagnosis not present

## 2018-04-28 ENCOUNTER — Encounter: Payer: Self-pay | Admitting: *Deleted

## 2018-04-28 ENCOUNTER — Ambulatory Visit: Payer: Medicare HMO | Admitting: Physician Assistant

## 2018-04-28 ENCOUNTER — Other Ambulatory Visit: Payer: Self-pay

## 2018-04-28 ENCOUNTER — Other Ambulatory Visit: Payer: Self-pay | Admitting: *Deleted

## 2018-04-28 DIAGNOSIS — E1169 Type 2 diabetes mellitus with other specified complication: Secondary | ICD-10-CM | POA: Diagnosis not present

## 2018-04-28 DIAGNOSIS — E785 Hyperlipidemia, unspecified: Secondary | ICD-10-CM | POA: Diagnosis not present

## 2018-04-28 DIAGNOSIS — D509 Iron deficiency anemia, unspecified: Secondary | ICD-10-CM | POA: Diagnosis not present

## 2018-04-28 DIAGNOSIS — I2510 Atherosclerotic heart disease of native coronary artery without angina pectoris: Secondary | ICD-10-CM | POA: Diagnosis not present

## 2018-04-28 DIAGNOSIS — I10 Essential (primary) hypertension: Secondary | ICD-10-CM | POA: Diagnosis not present

## 2018-04-28 DIAGNOSIS — F329 Major depressive disorder, single episode, unspecified: Secondary | ICD-10-CM | POA: Diagnosis not present

## 2018-04-28 DIAGNOSIS — A411 Sepsis due to other specified staphylococcus: Secondary | ICD-10-CM | POA: Diagnosis not present

## 2018-04-28 DIAGNOSIS — F0390 Unspecified dementia without behavioral disturbance: Secondary | ICD-10-CM | POA: Diagnosis not present

## 2018-04-28 DIAGNOSIS — I214 Non-ST elevation (NSTEMI) myocardial infarction: Secondary | ICD-10-CM | POA: Diagnosis not present

## 2018-04-28 NOTE — Patient Outreach (Addendum)
[facility]) Care Management  04/28/2018  [name] 02/28/1933 [id number]   Successful outreach to patient's daughter, [name], regarding social work referral for transportation and meal assistance.  Ms. Benjamin Stain reported that they have secured a new in-home aide who has also agreed to provide transportation to MD appointments.  BSW did educate her about additional transportation resources in case they are needed in the future.  BSW informed her of transportation benefit with [employer] which can be used for county to county transportation that is within 25 miles.  BSW also informed her of [employer] which can be utilized for in-county transportation.   BSW inquired about the particular "meal assistance" that is needed.  She denied the need for a referral to Meals on Wheels and said that [name] has the financial means to afford food.  She said that it is challenging to get him to eat healthy because he is a "picky eater". She said his preference is fast food which is obviously not good for him.  BSW encouraged her to address this with RN during upcoming home visit as she can provide education on the importance of healthy diet.  BSW is closing case at this time due to no social work needs being identified.   [name], BSW Social Worker [phone]
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
## 2018-04-28 NOTE — Patient Outreach (Signed)
[facility]) Care Management   04/28/2018  [name] March 15, 1933 [id number]  [name] is an 85 y.o. male  Subjective: Initial home visit with pt, CG Annette present, HIPAA verified, CG reports she stays with pt daily and daughters spend the night, pt has 24 hour assistance due to dementia, reports pt memory, forgetfulness comes and goes and sometimes pt has aggression and refuses to drink fluids or walk.  CG states she checks CBG daily " if he will let me and readings after eating have been around 160"  Home health RN continues to oversee IV ancef administration with CG assisting, PT working with pt.  CG reports pt does not drink enough fluids and appetite can vary day to day depending on what type of mood pt has.  Objective:   Vitals:   04/28/18 1227  BP: (!) 88/54  Pulse: 96  Resp: 18  SpO2: 98%  Weight: 183 lb (83 kg)  Height: 1.803 m ('5\' 11"' )   ROS  Physical Exam  Constitutional: He is oriented to person, place, and time. He appears well-developed and well-nourished.  HENT:  Head: Normocephalic.  Neck: Normal range of motion. Neck supple.  Cardiovascular: Normal rate.  GI: Soft. Bowel sounds are normal.  Musculoskeletal: Normal range of motion.  Neurological: He is alert and oriented to person, place, and time.  Pt oriented x 3 today, per CG, dementia varies day to day  Skin: Skin is warm and dry.  Psychiatric: He has a normal mood and affect. His behavior is normal.  dementia    Encounter Medications:   Outpatient Encounter Medications as of 04/28/2018  Medication Sig Note  . ACCU-CHEK SOFTCLIX LANCETS lancets Use as instructed   . aspirin 81 MG tablet Take 1 tablet (81 mg total) by mouth every morning.   Marland Kitchen ceFAZolin (ANCEF) IVPB Inject 2 g into the vein every 8 (eight) hours for 11 days. Indication:  MSSE Bacteremia Last Day of Therapy:  05/02/2018 Labs - Once weekly:  CBC/D and BMP, Labs - Every other week:  ESR and CRP   . escitalopram  (LEXAPRO) 20 MG tablet Take 5 mg by mouth daily.  04/02/2018: 10 mg daily   . furosemide (LASIX) 20 MG tablet Take 1 tablet (20 mg total) by mouth daily. Take 40 mg on Mon, Wed, Fri and 20 mg all other days.   Marland Kitchen glucose blood test strip CHECK BLOOD SUGAR UP TO 4 TIMES A DAY   . metFORMIN (GLUCOPHAGE) 500 MG tablet Take 1 tablet (500 mg total) by mouth 2 (two) times daily.   . metoprolol succinate (TOPROL-XL) 25 MG 24 hr tablet Take 0.5 tablets (12.5 mg total) by mouth daily before breakfast.   . nitroGLYCERIN (NITROSTAT) 0.4 MG SL tablet Place 1 tablet (0.4 mg total) under the tongue every 5 (five) minutes x 3 doses as needed for chest pain. 04/17/2018: Patient keeps on hand  . pantoprazole (PROTONIX) 40 MG tablet TAKE (1) TABLET TWICE A DAY BEFOR A MEAL   . polyethylene glycol (MIRALAX / GLYCOLAX) packet Take 17 g by mouth daily.   . risperiDONE (RISPERDAL) 0.25 MG tablet Take 1 tablet (0.25 mg total) by mouth 3 (three) times daily.   . rosuvastatin (CRESTOR) 40 MG tablet Take 1 tablet (40 mg total) by mouth every evening.    No facility-administered encounter medications on file as of 04/28/2018.     Functional Status:   In your present state of health, do you have any  difficulty performing the following activities: 04/28/2018 04/18/2018  Hearing? - N  Vision? - N  Difficulty concentrating or making decisions? - N  Comment - -  Walking or climbing stairs? - N  Dressing or bathing? - N  Doing errands, shopping? - Y  Preparing Food and eating ? Y -  Using the Toilet? N -  In the past six months, have you accidently leaked urine? N -  Do you have problems with loss of bowel control? N -  Managing your Medications? Y -  Managing your Finances? Y -  Housekeeping or managing your Housekeeping? Y -  Some recent data might be hidden    Fall/Depression Screening:    Fall Risk  04/28/2018 04/02/2018 02/06/2018  Falls in the past year? '1 1 1  ' Number falls in past yr: '1 1 1  ' Injury with Fall? 0 0 0   Risk for fall due to : History of fall(s) - -   PHQ 2/9 Scores 04/28/2018 04/02/2018 02/17/2018 02/06/2018 02/03/2018 01/21/2018 01/08/2018  PHQ - 2 Score '2 2 3 4 2 4 5  ' PHQ- 9 Score - '15 14 11 ' - 10 19    Assessment:  RN CM reviewed prepackaged medications with caregiver,  CG is going to check BP daily (if pt will allow) and record in [facility] calendar,  Family wants to keep pt at home with 24 hour caregivers, RN CM focused on safety, nutrition and adequate fluids.  RN CM sent in basket to primary MD and reported today's BP, sent barrier letter and initial home visit to primary MD.  [facility] CM Care Plan Problem One     Most Recent Value  Care Plan Problem One  Knowledge deficit related to dementia  Role Documenting the Problem One  Care Management [place] for Problem One  Active  [facility]N Long Term Goal   Pt, family will effectively manage pt at home with improved self care related to dementia, pneumonia, falls  [facility]N Long Term Goal Start Date  04/28/18  Interventions for Problem One Long Term Goal  RN CM gave CG [facility] calendar for recording CBG, BP and keeping up with appointments, pt has 24 hour nurse advice line magnet on refrigerator, reviewed plan of care with CG, gave EMMI handouts  [facility] CM Short Term Goal #1   Pt will work with home health PT for strength training, increased endurance and complete presribed exercises within 30 days  [facility] CM Short Term Goal #1 Start Date  04/28/18  Interventions for Short Term Goal #1  RN CM reviewed importance of working with home health PT and walking daily, using walker   [facility]N CM Short Term Goal #2   CG, family will verbalize sign/s symptoms pneumonia, infection within 30 days  [facility] CM Short Term Goal #2 Start Date  04/28/18  Interventions for Short Term Goal #2   RN CM reviewed signs/ symptoms infection and reportable signs/ symptoms, pt continues to receive IV ancef through 05/02/18  [facility] CM Short Term Goal #3  CG family will practice and verbalize safety  precautions within 30 days  [facility]N CM Short Term Goal #3 Start Date  04/28/18  Interventions for Short Tern Goal #3  RN CM reviewed safety precautions, importance of assisting pt with ambulating as needed, using DME    [facility] CM Care Plan Problem Two     Most Recent Value  Care Plan Problem Two  Hypotension  Role Documenting the Problem Two  Care Management Coordinator  Care Plan for Problem Two  Active  [facility]N CM Short Term Goal #1   Pt will drink adequate fluids within 30 days to help maintain normal BP  [facility]N CM Short Term Goal #1 Start Date  04/28/18  Interventions for Short Term Goal #2   RN CM asked CG to offer fluids throughout the day, (Pt refuses to drink if he does not want to), ask CG to offer beverages pt likes and pt sipping on beverage during the day is better than not drinking at all  [facility] CM Short Term Goal #2   CG will check BP daily and record in [facility] calendar within 30 days  [facility]N CM Short Term Goal #2 Start Date  04/28/18  Interventions for Short Term Goal #2  RN CM ask CG to check BP daily and record in [facility] calendar, take to MD appointments, RN CM spoke with [facility] RN Juliane Poot who reports she has gotten few low readings and pt is not drinking adequate fluids,  RN CM reported today's BP 88/54 to primary MD      Plan: follow up on blood pressure within the next week Call for telephonic assessment next month  [name] [facility], [place] Coordinator [phone]

## 2018-04-29 ENCOUNTER — Other Ambulatory Visit: Payer: Self-pay | Admitting: *Deleted

## 2018-04-29 NOTE — Patient Outreach (Signed)
Call received from [facility] stating she checked pt BP today with reading 84/61, she states pt did drink more fluids today and she will continue checking BP daily, [name] will call RN CM back in morning with report of BP.  RN CM called primary MD office Dr. Laurance Flatten, spoke with Juliann Pulse and reported today's BP,  RN CM faxed note reporting the same.  PLAN Speak with CG tomorrow for BP reading  [name] [facility], [place] Coordinator [phone]

## 2018-04-30 ENCOUNTER — Other Ambulatory Visit: Payer: Self-pay | Admitting: *Deleted

## 2018-04-30 ENCOUNTER — Ambulatory Visit (INDEPENDENT_AMBULATORY_CARE_PROVIDER_SITE_OTHER): Payer: Medicare HMO

## 2018-04-30 ENCOUNTER — Ambulatory Visit (HOSPITAL_COMMUNITY): Payer: Medicare HMO

## 2018-04-30 DIAGNOSIS — A411 Sepsis due to other specified staphylococcus: Secondary | ICD-10-CM

## 2018-04-30 DIAGNOSIS — I10 Essential (primary) hypertension: Secondary | ICD-10-CM | POA: Diagnosis not present

## 2018-04-30 DIAGNOSIS — E785 Hyperlipidemia, unspecified: Secondary | ICD-10-CM | POA: Diagnosis not present

## 2018-04-30 DIAGNOSIS — E1169 Type 2 diabetes mellitus with other specified complication: Secondary | ICD-10-CM

## 2018-04-30 DIAGNOSIS — D509 Iron deficiency anemia, unspecified: Secondary | ICD-10-CM | POA: Diagnosis not present

## 2018-04-30 DIAGNOSIS — N400 Enlarged prostate without lower urinary tract symptoms: Secondary | ICD-10-CM

## 2018-04-30 DIAGNOSIS — I214 Non-ST elevation (NSTEMI) myocardial infarction: Secondary | ICD-10-CM | POA: Diagnosis not present

## 2018-04-30 DIAGNOSIS — F0390 Unspecified dementia without behavioral disturbance: Secondary | ICD-10-CM

## 2018-04-30 DIAGNOSIS — F329 Major depressive disorder, single episode, unspecified: Secondary | ICD-10-CM

## 2018-04-30 DIAGNOSIS — I951 Orthostatic hypotension: Secondary | ICD-10-CM

## 2018-04-30 DIAGNOSIS — I2510 Atherosclerotic heart disease of native coronary artery without angina pectoris: Secondary | ICD-10-CM | POA: Diagnosis not present

## 2018-04-30 DIAGNOSIS — Z7982 Long term (current) use of aspirin: Secondary | ICD-10-CM

## 2018-04-30 DIAGNOSIS — M1990 Unspecified osteoarthritis, unspecified site: Secondary | ICD-10-CM

## 2018-04-30 DIAGNOSIS — Z792 Long term (current) use of antibiotics: Secondary | ICD-10-CM

## 2018-04-30 DIAGNOSIS — Z452 Encounter for adjustment and management of vascular access device: Secondary | ICD-10-CM

## 2018-04-30 NOTE — Patient Outreach (Signed)
Outreach call to patient's home for follow up on BP, spoke with caregiver [name] (stays with pt at least 8 hours daily), [name] states pt is in the bathroom and she has not been able to check BP yet, [name] will call RN CM when she obtains readings. [name] called back and states BP today 107/60 and 127/69, pulse rate 78, Annette verbalizes understanding to continue checking BP daily and logging and call MD office for any concerns, low or high readings outside normal limits.  RN CM faxed today's note to primary MD office.  PLAN Outreach pt for follow up on BP next week  [name] [facility], [place] Coordinator [phone]

## 2018-05-01 DIAGNOSIS — I2510 Atherosclerotic heart disease of native coronary artery without angina pectoris: Secondary | ICD-10-CM | POA: Diagnosis not present

## 2018-05-01 DIAGNOSIS — I214 Non-ST elevation (NSTEMI) myocardial infarction: Secondary | ICD-10-CM | POA: Diagnosis not present

## 2018-05-01 DIAGNOSIS — A411 Sepsis due to other specified staphylococcus: Secondary | ICD-10-CM | POA: Diagnosis not present

## 2018-05-01 DIAGNOSIS — D509 Iron deficiency anemia, unspecified: Secondary | ICD-10-CM | POA: Diagnosis not present

## 2018-05-01 DIAGNOSIS — R7881 Bacteremia: Secondary | ICD-10-CM | POA: Diagnosis not present

## 2018-05-01 DIAGNOSIS — F329 Major depressive disorder, single episode, unspecified: Secondary | ICD-10-CM | POA: Diagnosis not present

## 2018-05-01 DIAGNOSIS — F0390 Unspecified dementia without behavioral disturbance: Secondary | ICD-10-CM | POA: Diagnosis not present

## 2018-05-01 DIAGNOSIS — E1169 Type 2 diabetes mellitus with other specified complication: Secondary | ICD-10-CM | POA: Diagnosis not present

## 2018-05-01 DIAGNOSIS — I10 Essential (primary) hypertension: Secondary | ICD-10-CM | POA: Diagnosis not present

## 2018-05-01 DIAGNOSIS — E785 Hyperlipidemia, unspecified: Secondary | ICD-10-CM | POA: Diagnosis not present

## 2018-05-04 ENCOUNTER — Encounter: Payer: Self-pay | Admitting: Family Medicine

## 2018-05-04 ENCOUNTER — Ambulatory Visit (INDEPENDENT_AMBULATORY_CARE_PROVIDER_SITE_OTHER): Payer: Medicare HMO | Admitting: Family Medicine

## 2018-05-04 VITALS — BP 98/60 | HR 93 | Temp 96.7°F | Ht 71.0 in | Wt 156.0 lb

## 2018-05-04 DIAGNOSIS — D509 Iron deficiency anemia, unspecified: Secondary | ICD-10-CM | POA: Diagnosis not present

## 2018-05-04 DIAGNOSIS — F329 Major depressive disorder, single episode, unspecified: Secondary | ICD-10-CM | POA: Diagnosis not present

## 2018-05-04 DIAGNOSIS — R609 Edema, unspecified: Secondary | ICD-10-CM

## 2018-05-04 DIAGNOSIS — I10 Essential (primary) hypertension: Secondary | ICD-10-CM | POA: Diagnosis not present

## 2018-05-04 DIAGNOSIS — D508 Other iron deficiency anemias: Secondary | ICD-10-CM

## 2018-05-04 DIAGNOSIS — R531 Weakness: Secondary | ICD-10-CM

## 2018-05-04 DIAGNOSIS — A411 Sepsis due to other specified staphylococcus: Secondary | ICD-10-CM | POA: Diagnosis not present

## 2018-05-04 DIAGNOSIS — I2510 Atherosclerotic heart disease of native coronary artery without angina pectoris: Secondary | ICD-10-CM | POA: Diagnosis not present

## 2018-05-04 DIAGNOSIS — E876 Hypokalemia: Secondary | ICD-10-CM

## 2018-05-04 DIAGNOSIS — R2681 Unsteadiness on feet: Secondary | ICD-10-CM | POA: Diagnosis not present

## 2018-05-04 DIAGNOSIS — E1169 Type 2 diabetes mellitus with other specified complication: Secondary | ICD-10-CM | POA: Diagnosis not present

## 2018-05-04 DIAGNOSIS — R296 Repeated falls: Secondary | ICD-10-CM | POA: Diagnosis not present

## 2018-05-04 DIAGNOSIS — F0390 Unspecified dementia without behavioral disturbance: Secondary | ICD-10-CM | POA: Diagnosis not present

## 2018-05-04 DIAGNOSIS — E785 Hyperlipidemia, unspecified: Secondary | ICD-10-CM | POA: Diagnosis not present

## 2018-05-04 DIAGNOSIS — I214 Non-ST elevation (NSTEMI) myocardial infarction: Secondary | ICD-10-CM | POA: Diagnosis not present

## 2018-05-04 NOTE — Patient Instructions (Addendum)
Please follow-up on when next iron infusion is due--- actually it is this coming Thursday  encourage the patient to use walker or cane for stability Encourage the patient to drink plenty of water and fluids and stay well-hydrated No driving Monitor closely so as he is not apt to fall

## 2018-05-04 NOTE — Progress Notes (Signed)
Subjective:    Patient ID: [name], male    DOB: 18-Aug-1932, 85 y.o.   MRN: [id number]  HPI Patient here today for 4 week follow up on edema and recent hospital visits. He has a new caregiver with him today, her name Is Coralyn Mark and this is her first day.  Patient is calm and relaxed and did not know the name of his new caregiver who to started today.  Today he denies any chest pain pressure tightness or shortness of breath.  He denies any trouble with his bowels moving nausea vomiting diarrhea or blood in the stool.  He is passing his water without problems.  He denies any shortness of breath.  The caregiver who is only been with him for short period of time is not aware of any new problems or complaints.  He is due to get his next iron infusion this Thursday and the patient and the caregiver were reminded of this.  He is also due to get his PICC line removed from a recent bout with sepsis.  We will get a CBC and a BMP on him today.  He was reminded that he is not supposed to be driving.  I am not sure who the responsible family member is but in our records it is [name] but the caregiver was hired by [employer].   Patient Active Problem List   Diagnosis Date Noted  . Demand ischemia of myocardium ([place]) 04/20/2018  . Bacteremia 04/20/2018  . AMS (altered mental status) 04/18/2018  . Sepsis ([place]) 04/18/2018  . Fever 04/16/2018  . Vomiting 04/16/2018  . Type 2 diabetes mellitus ([place]) 04/16/2018  . Cerebrovascular accident (CVA) ([place]) 02/11/2018  . Dementia without behavioral disturbance ([place]) 01/27/2018  . Falls frequently 01/27/2018  . DNR (do not resuscitate) 01/27/2018  . Diabetes mellitus due to underlying condition with diabetic autonomic neuropathy, without long-term current use of insulin ([place]) 01/21/2018  . Depression 12/26/2017  . SIRS (systemic inflammatory response syndrome) ([place]) 12/26/2017  . Syncope   . Syncope and collapse   . Fall   . Microcytic anemia 12/25/2017  .  Dyslipidemia 10/15/2017  . Leg swelling 10/15/2017  . Palpitations 08/21/2016  . HTN (hypertension) 08/21/2016  . Carotid bruit 08/21/2016  . H/O aortic valve replacement 08/21/2016  . Anemia, iron deficiency 04/11/2014  . Orthostatic hypotension 06/22/2012  . Dizzy 06/22/2012  . Precordial pain 09/18/2011  . Unstable angina ([place]) 07/24/2011  . MV CAD - s/p CABG with AVR 01/30/2011  . COLONIC POLYPS 06/23/2008  . Hyperlipidemia LDL goal <70 06/23/2008  . Aortic valve disorder 06/23/2008  . BENIGN PROSTATIC HYPERTROPHY, HX OF 06/23/2008   Outpatient Encounter Medications as of 05/04/2018  Medication Sig  . ACCU-CHEK SOFTCLIX LANCETS lancets Use as instructed  . aspirin 81 MG tablet Take 1 tablet (81 mg total) by mouth every morning.  . escitalopram (LEXAPRO) 20 MG tablet Take 5 mg by mouth daily.   . furosemide (LASIX) 20 MG tablet Take 1 tablet (20 mg total) by mouth daily. Take 40 mg on Mon, Wed, Fri and 20 mg all other days.  Marland Kitchen glucose blood test strip CHECK BLOOD SUGAR UP TO 4 TIMES A DAY  . metFORMIN (GLUCOPHAGE) 500 MG tablet Take 1 tablet (500 mg total) by mouth 2 (two) times daily.  . metoprolol succinate (TOPROL-XL) 25 MG 24 hr tablet Take 0.5 tablets (12.5 mg total) by mouth daily before breakfast.  . nitroGLYCERIN (NITROSTAT) 0.4 MG SL tablet Place 1 tablet (  0.4 mg total) under the tongue every 5 (five) minutes x 3 doses as needed for chest pain.  . pantoprazole (PROTONIX) 40 MG tablet TAKE (1) TABLET TWICE A DAY BEFOR A MEAL  . polyethylene glycol (MIRALAX / GLYCOLAX) packet Take 17 g by mouth daily.  . risperiDONE (RISPERDAL) 0.25 MG tablet Take 1 tablet (0.25 mg total) by mouth 3 (three) times daily.  . rosuvastatin (CRESTOR) 40 MG tablet Take 1 tablet (40 mg total) by mouth every evening.   No facility-administered encounter medications on file as of 05/04/2018.       Review of Systems  Constitutional: Negative.   HENT: Negative.   Eyes: Negative.   Respiratory:  Negative.   Cardiovascular: Negative.  Negative for leg swelling.  Gastrointestinal: Negative.   Endocrine: Negative.   Genitourinary: Negative.   Musculoskeletal: Negative.   Skin: Negative.   Allergic/Immunologic: Negative.   Neurological: Negative.   Hematological: Negative.   Psychiatric/Behavioral: Negative.        Objective:   Physical Exam Vitals signs and nursing note reviewed.  Constitutional:      General: He is not in acute distress.    Appearance: Normal appearance. He is well-developed and normal weight. He is not ill-appearing.     Comments: The patient looks good and carried on a conversation that was appropriate and did know my name.  HENT:     Head: Normocephalic and atraumatic.     Right Ear: Tympanic membrane, ear canal and external ear normal. There is no impacted cerumen.     Left Ear: Tympanic membrane, ear canal and external ear normal. There is no impacted cerumen.     Nose: Nose normal.     Mouth/Throat:     Mouth: Mucous membranes are moist.     Pharynx: Oropharynx is clear. No oropharyngeal exudate.  Eyes:     General: No scleral icterus.       Right eye: No discharge.        Left eye: No discharge.     Conjunctiva/sclera: Conjunctivae normal.     Pupils: Pupils are equal, round, and reactive to light.  Neck:     Musculoskeletal: Normal range of motion and neck supple.     Thyroid: No thyromegaly.     Vascular: No carotid bruit.     Trachea: No tracheal deviation.  Cardiovascular:     Rate and Rhythm: Normal rate and regular rhythm.     Heart sounds: Normal heart sounds. No murmur. No gallop.      Comments: The heart was regular at 72/min with no pedal edema or pretibial edema. Pulmonary:     Effort: Pulmonary effort is normal. No respiratory distress.     Breath sounds: Normal breath sounds. No wheezing or rales.     Comments: Clear anteriorly and posteriorly Abdominal:     General: Abdomen is flat. Bowel sounds are normal.     Palpations:  Abdomen is soft. There is no mass.     Tenderness: There is no abdominal tenderness.  Musculoskeletal: Normal range of motion.        General: No tenderness.     Right lower leg: No edema.     Left lower leg: No edema.     Comments: Range of motion of all extremities was good but patient was extremely weak and needed assistance with getting on the table and off the table and helping to steady his gait.  Lymphadenopathy:     Cervical: No cervical adenopathy.  Skin:    General: Skin is warm and dry.     Findings: No bruising or rash.  Neurological:     General: No focal deficit present.     Mental Status: He is alert and oriented to person, place, and time. Mental status is at baseline.     Cranial Nerves: No cranial nerve deficit.     Motor: Weakness present.     Gait: Gait abnormal.     Deep Tendon Reflexes: Reflexes are normal and symmetric. Reflexes normal.     Comments: Patient answers questions appropriately today but he definitely has some mild dementia.  Psychiatric:        Mood and Affect: Mood normal.        Behavior: Behavior normal.        Thought Content: Thought content normal.        Judgment: Judgment normal.    BP 98/60 (BP Location: Left Arm)   Pulse 93   Temp (!) 96.7 F (35.9 C) (Oral)   Ht '5\' 11"'  (1.803 m)   Wt 156 lb (70.8 kg)   BMI 21.76 kg/m         Assessment & Plan:  1. General weakness -Patient is due another iron infusion this week.  He is going to have the PICC line removed soon.  The caregiver was encouraged to give him lots of fluids and keep him well-hydrated. - BMP8+EGFR - CBC with Differential/Platelet  2. Decreased potassium in the blood - BMP8+EGFR  3. Gait instability -The patient should use a walker or cane when he is outside of the home on a regular basis.  4. Edema, unspecified type -No edema today.  5. Frequent falls -No falls for over 1 month.  6. Other iron deficiency anemia -Follow-up with hematology this week for  iron infusion and continue regular visits for this.  Patient Instructions  Please follow-up on when next iron infusion is due--- actually it is this coming Thursday  encourage the patient to use walker or cane for stability Encourage the patient to drink plenty of water and fluids and stay well-hydrated No driving Monitor closely so as he is not apt to fall  Arrie Senate MD

## 2018-05-05 ENCOUNTER — Other Ambulatory Visit: Payer: Self-pay | Admitting: *Deleted

## 2018-05-05 LAB — CBC WITH DIFFERENTIAL/PLATELET
Basophils Absolute: 0.1 10*3/uL (ref 0.0–0.2)
Basos: 1 %
EOS (ABSOLUTE): 0.4 10*3/uL (ref 0.0–0.4)
Eos: 4 %
HEMOGLOBIN: 11.4 g/dL — AB (ref 13.0–17.7)
Hematocrit: 34.4 % — ABNORMAL LOW (ref 37.5–51.0)
IMMATURE GRANS (ABS): 0 10*3/uL (ref 0.0–0.1)
Immature Granulocytes: 0 %
Lymphocytes Absolute: 1.9 10*3/uL (ref 0.7–3.1)
Lymphs: 20 %
MCH: 29.4 pg (ref 26.6–33.0)
MCHC: 33.1 g/dL (ref 31.5–35.7)
MCV: 89 fL (ref 79–97)
Monocytes Absolute: 0.7 10*3/uL (ref 0.1–0.9)
Monocytes: 7 %
Neutrophils Absolute: 6.3 10*3/uL (ref 1.4–7.0)
Neutrophils: 68 %
Platelets: 280 10*3/uL (ref 150–450)
RBC: 3.88 x10E6/uL — ABNORMAL LOW (ref 4.14–5.80)
RDW: 13.9 % (ref 11.6–15.4)
WBC: 9.3 10*3/uL (ref 3.4–10.8)

## 2018-05-05 LAB — BMP8+EGFR
BUN/Creatinine Ratio: 17 (ref 10–24)
BUN: 26 mg/dL (ref 8–27)
CHLORIDE: 100 mmol/L (ref 96–106)
CO2: 24 mmol/L (ref 20–29)
Calcium: 9.4 mg/dL (ref 8.6–10.2)
Creatinine, Ser: 1.54 mg/dL — ABNORMAL HIGH (ref 0.76–1.27)
GFR calc Af Amer: 48 mL/min/{1.73_m2} — ABNORMAL LOW (ref >59)
GFR calc non Af Amer: 41 mL/min/{1.73_m2} — ABNORMAL LOW (ref >59)
Glucose: 102 mg/dL — ABNORMAL HIGH (ref 65–99)
Potassium: 4.3 mmol/L (ref 3.5–5.2)
Sodium: 139 mmol/L (ref 134–144)

## 2018-05-05 NOTE — Patient Outreach (Signed)
Outreach call to pt for follow up on blood pressure, spoke with patient's daughter [name], HIPAA verified, [name] reports patient's caregiver [name] is no longer staying with pt due to pt being rude with her.  [name] and her sister are the primary caregivers at present.  [name] states pt saw primary care yesterday, blood pressure not being checked daily, maybe few times weekly, reports " the blood pressure is much better".  PLAN Outreach pt next month for telephone assessment  [name] [facility], [place] Coordinator [phone]

## 2018-05-06 ENCOUNTER — Inpatient Hospital Stay (HOSPITAL_COMMUNITY): Payer: Medicare HMO | Attending: Hematology

## 2018-05-06 VITALS — BP 96/48 | HR 87 | Temp 97.8°F | Resp 18

## 2018-05-06 DIAGNOSIS — I10 Essential (primary) hypertension: Secondary | ICD-10-CM | POA: Diagnosis not present

## 2018-05-06 DIAGNOSIS — I214 Non-ST elevation (NSTEMI) myocardial infarction: Secondary | ICD-10-CM | POA: Diagnosis not present

## 2018-05-06 DIAGNOSIS — D509 Iron deficiency anemia, unspecified: Secondary | ICD-10-CM | POA: Diagnosis not present

## 2018-05-06 DIAGNOSIS — F329 Major depressive disorder, single episode, unspecified: Secondary | ICD-10-CM | POA: Diagnosis not present

## 2018-05-06 DIAGNOSIS — E1169 Type 2 diabetes mellitus with other specified complication: Secondary | ICD-10-CM | POA: Diagnosis not present

## 2018-05-06 DIAGNOSIS — A411 Sepsis due to other specified staphylococcus: Secondary | ICD-10-CM | POA: Diagnosis not present

## 2018-05-06 DIAGNOSIS — F0390 Unspecified dementia without behavioral disturbance: Secondary | ICD-10-CM | POA: Diagnosis not present

## 2018-05-06 DIAGNOSIS — I2510 Atherosclerotic heart disease of native coronary artery without angina pectoris: Secondary | ICD-10-CM | POA: Diagnosis not present

## 2018-05-06 DIAGNOSIS — E785 Hyperlipidemia, unspecified: Secondary | ICD-10-CM | POA: Diagnosis not present

## 2018-05-06 MED ORDER — SODIUM CHLORIDE 0.9 % IV SOLN
510.0000 mg | Freq: Once | INTRAVENOUS | Status: AC
  Administered 2018-05-06: 510 mg via INTRAVENOUS
  Filled 2018-05-06: qty 510

## 2018-05-06 MED ORDER — SODIUM CHLORIDE 0.9 % IV SOLN
INTRAVENOUS | Status: DC
  Administered 2018-05-06: 10:00:00 via INTRAVENOUS

## 2018-05-06 NOTE — Progress Notes (Signed)
Feraheme given today per orders.  Patient tolerated it well without problems. Vitals stable and discharged home from clinic ambulatory with caregiver.  Follow up as scheduled.

## 2018-05-06 NOTE — Patient Instructions (Signed)
[facility]  Discharge Instructions:   _______________________________________________________________  Thank you for choosing [facility] at [facility] to provide your oncology and hematology care.  To afford each patient quality time with our providers, please arrive at least 15 minutes before your scheduled appointment.  You need to re-schedule your appointment if you arrive 10 or more minutes late.  We strive to give you quality time with our providers, and arriving late affects you and other patients whose appointments are after yours.  Also, if you no show three or more times for appointments you may be dismissed from the clinic.  Again, thank you for choosing [facility] at [facility]. Our hope is that these requests will allow you access to exceptional care and in a timely manner. _______________________________________________________________  If you have questions after your visit, please contact our office at [phone] between the hours of 8:30 a.m. and 5:00 p.m. Voicemails left after 4:30 p.m. will not be returned until the following business day. _______________________________________________________________  For prescription refill requests, have your pharmacy contact our office. _______________________________________________________________  Recommendations made by the consultant and any test results will be sent to your referring physician. _______________________________________________________________ 

## 2018-05-07 ENCOUNTER — Ambulatory Visit (HOSPITAL_COMMUNITY): Payer: Medicare HMO

## 2018-05-07 DIAGNOSIS — I2510 Atherosclerotic heart disease of native coronary artery without angina pectoris: Secondary | ICD-10-CM | POA: Diagnosis not present

## 2018-05-07 DIAGNOSIS — A411 Sepsis due to other specified staphylococcus: Secondary | ICD-10-CM | POA: Diagnosis not present

## 2018-05-07 DIAGNOSIS — I214 Non-ST elevation (NSTEMI) myocardial infarction: Secondary | ICD-10-CM | POA: Diagnosis not present

## 2018-05-07 DIAGNOSIS — D509 Iron deficiency anemia, unspecified: Secondary | ICD-10-CM | POA: Diagnosis not present

## 2018-05-07 DIAGNOSIS — I10 Essential (primary) hypertension: Secondary | ICD-10-CM | POA: Diagnosis not present

## 2018-05-07 DIAGNOSIS — E785 Hyperlipidemia, unspecified: Secondary | ICD-10-CM | POA: Diagnosis not present

## 2018-05-07 DIAGNOSIS — F329 Major depressive disorder, single episode, unspecified: Secondary | ICD-10-CM | POA: Diagnosis not present

## 2018-05-07 DIAGNOSIS — F0390 Unspecified dementia without behavioral disturbance: Secondary | ICD-10-CM | POA: Diagnosis not present

## 2018-05-07 DIAGNOSIS — E1169 Type 2 diabetes mellitus with other specified complication: Secondary | ICD-10-CM | POA: Diagnosis not present

## 2018-05-12 ENCOUNTER — Ambulatory Visit (INDEPENDENT_AMBULATORY_CARE_PROVIDER_SITE_OTHER): Payer: Medicare HMO | Admitting: Internal Medicine

## 2018-05-12 ENCOUNTER — Other Ambulatory Visit: Payer: Self-pay

## 2018-05-12 ENCOUNTER — Encounter: Payer: Self-pay | Admitting: Internal Medicine

## 2018-05-12 VITALS — BP 102/58 | HR 86 | Temp 97.9°F | Ht 71.0 in | Wt 156.0 lb

## 2018-05-12 DIAGNOSIS — Z452 Encounter for adjustment and management of vascular access device: Secondary | ICD-10-CM | POA: Insufficient documentation

## 2018-05-12 DIAGNOSIS — R7881 Bacteremia: Secondary | ICD-10-CM

## 2018-05-12 DIAGNOSIS — Z952 Presence of prosthetic heart valve: Secondary | ICD-10-CM

## 2018-05-12 NOTE — Assessment & Plan Note (Signed)
He had no issues with the picc line and is now removed.

## 2018-05-12 NOTE — Assessment & Plan Note (Signed)
Repeat blood culture turned positive so will repeat again today off of antibiotics.   If again positive, he knows he will need to return to the hospital

## 2018-05-12 NOTE — Progress Notes (Signed)
   Subjective:    Patient ID: [name], male    DOB: [birth year], 85 y.o.   MRN: [id number]  HPI Here for follow up of recent hospitalization. MSSE bacteremia with a history of AVR and TEE negative for vegetation.  Has completed 2 weeks of IV antibiotics with cefazolin.  No new issues, no fever, no chills.  He did have one repeat blood culture that was again positive.  Feels well though.     Review of Systems  Constitutional: Negative for chills, fatigue and fever.  Gastrointestinal: Negative for diarrhea.  Skin: Negative for rash.       Objective:   Physical Exam Constitutional:      Appearance: Normal appearance.  Eyes:     General: No scleral icterus. Cardiovascular:     Rate and Rhythm: Normal rate and regular rhythm.     Heart sounds: No murmur.  Pulmonary:     Effort: Pulmonary effort is normal.     Breath sounds: Normal breath sounds.  Musculoskeletal:     Right lower leg: No edema.     Left lower leg: No edema.  Skin:    Findings: No rash.  Neurological:     Mental Status: He is alert.   SH: former smoker        Assessment & Plan:

## 2018-05-12 NOTE — Assessment & Plan Note (Signed)
Negative TEE.  No concerns at this time.   Will do surveillance blood cultures x 2 today and if negative, no follow up needed.

## 2018-05-13 ENCOUNTER — Ambulatory Visit (INDEPENDENT_AMBULATORY_CARE_PROVIDER_SITE_OTHER): Payer: Medicare HMO | Admitting: Cardiology

## 2018-05-13 ENCOUNTER — Encounter: Payer: Self-pay | Admitting: Cardiology

## 2018-05-13 VITALS — BP 104/58 | HR 92 | Ht 71.0 in | Wt 158.0 lb

## 2018-05-13 DIAGNOSIS — I214 Non-ST elevation (NSTEMI) myocardial infarction: Secondary | ICD-10-CM | POA: Diagnosis not present

## 2018-05-13 DIAGNOSIS — D509 Iron deficiency anemia, unspecified: Secondary | ICD-10-CM

## 2018-05-13 DIAGNOSIS — Z951 Presence of aortocoronary bypass graft: Secondary | ICD-10-CM | POA: Diagnosis not present

## 2018-05-13 DIAGNOSIS — A412 Sepsis due to unspecified staphylococcus: Secondary | ICD-10-CM | POA: Diagnosis not present

## 2018-05-13 NOTE — Assessment & Plan Note (Signed)
Admitted with fever and MS changes 04/19/2018-BC positive for Staph epidermidis-treated with PCII and ABs- ID following

## 2018-05-13 NOTE — Assessment & Plan Note (Signed)
08/22/2010- CABG x 3-L-LAD, S-RI, S-Dx2 [facility] Ease tissue AVR

## 2018-05-13 NOTE — Assessment & Plan Note (Signed)
Troponin peak 4.48 Jan 2020 in the setting of Staph sepsis LVF normal by echo

## 2018-05-13 NOTE — Progress Notes (Signed)
05/13/2018 [name]   25-Dec-1932  [id number]  Primary Physician Chipper Herb, MD Primary Cardiologist: Dr Percival Spanish  HPI: [name] is a pleasant 85 year old male followed in [place] by Dr. Percival Spanish.  He has a history of CABG x3 as well as a tissue AVR May 2012.  Other medical issues include chronic anemia.  He has been noted to be frail.  He had some falls earlier this year.  He has chronic anemia and in January had transfusion as an outpatient.  A few days later he presented to [facility] with sepsis and mental status changes.  Blood cultures were positive for staph.  A PICC line was placed and the patient was seen by ID and started on systemic antibiotics.  During that admission his troponin was also noted to be elevated, it peaked at 4.48, suspicious for a NSTEMI.  Transthoracic echocardiogram revealed an ejection fraction of 65 to 70%.  TEE showed no evidence of of prosthetic valve endocarditis.  The patient was discharged April 22, 2018.  Hospital notes indicate that further ischemic work-up could be considered as an outpatient.  Patient is in the office today for follow-up.  He saw Dr. Linus Salmons with ID yesterday.  His PICC line has been removed.  The patient denies any chest pain or unusual shortness of breath.  His home aide accompanied him to the office.  She indicated he has been doing pretty well although still very weak.   Current Outpatient Medications  Medication Sig Dispense Refill  . ACCU-CHEK SOFTCLIX LANCETS lancets Use as instructed 100 each 12  . aspirin 81 MG tablet Take 1 tablet (81 mg total) by mouth every morning. 30 tablet 11  . escitalopram (LEXAPRO) 20 MG tablet Take 5 mg by mouth daily.     . furosemide (LASIX) 20 MG tablet Take 1 tablet (20 mg total) by mouth daily. Take 40 mg on Mon, Wed, Fri and 20 mg all other days.    Marland Kitchen glucose blood test strip CHECK BLOOD SUGAR UP TO 4 TIMES A DAY 100 each 3  . metFORMIN (GLUCOPHAGE) 500 MG tablet Take 1 tablet (500 mg  total) by mouth 2 (two) times daily. 60 tablet 11  . metoprolol succinate (TOPROL-XL) 25 MG 24 hr tablet Take 0.5 tablets (12.5 mg total) by mouth daily before breakfast. 45 tablet 3  . nitroGLYCERIN (NITROSTAT) 0.4 MG SL tablet Place 1 tablet (0.4 mg total) under the tongue every 5 (five) minutes x 3 doses as needed for chest pain. 25 tablet prn  . pantoprazole (PROTONIX) 40 MG tablet TAKE (1) TABLET TWICE A DAY BEFOR A MEAL 60 tablet 4  . polyethylene glycol (MIRALAX / GLYCOLAX) packet Take 17 g by mouth daily. 30 each 3  . risperiDONE (RISPERDAL) 0.25 MG tablet Take 1 tablet (0.25 mg total) by mouth 3 (three) times daily. 90 tablet 3  . rosuvastatin (CRESTOR) 40 MG tablet Take 1 tablet (40 mg total) by mouth every evening. 30 tablet 11   No current facility-administered medications for this visit.     No Known Allergies  Past Medical History:  Diagnosis Date  . Anemia    Iron deficiency  . Aortic stenosis    s/p pericardial AVR  . Aortic valve disorder 06/23/2008   Qualifier: Diagnosis of  By: Mare Ferrari, RMA, Sherri    . Arthritis   . Benign prostatic hypertrophy   . CAD (coronary artery disease) 01/30/2011  . Carotid bruit 08/21/2016  . COLONIC POLYPS 06/23/2008  Qualifier: Diagnosis of  By: Mare Ferrari, [place], [place]    . Coronary artery disease    s/p CABG 08/2010    . Diabetes mellitus   . Dyslipidemia 10/15/2017  . Essential hypertension 08/21/2016  . H/O aortic valve replacement 08/21/2016  . Hyperlipidemia   . Hyperlipidemia LDL goal <70 06/23/2008   Qualifier: Diagnosis of  By: Mare Ferrari, RMA, Sherri    . Leg swelling 10/15/2017  . Orthostatic hypotension 06/22/2012  . Palpitations 08/21/2016  . Precordial pain 09/18/2011  . Type 2 diabetes mellitus with hyperlipidemia ([place]) 06/23/2008   Qualifier: Diagnosis of  By: Mare Ferrari, RMA, Sherri    . Unstable angina ([place]) 07/24/2011    Social History   Socioeconomic History  . Marital status: Widowed    Spouse name: Not on file  . Number of  children: 4  . Years of education: 56  . Highest education level: 11th grade  Occupational History  . Occupation: Retired  Scientific laboratory technician  . Financial resource strain: Not on file  . Food insecurity:    Worry: Not on file    Inability: Not on file  . Transportation needs:    Medical: Not on file    Non-medical: Not on file  Tobacco Use  . Smoking status: Former Smoker    Types: Cigars    Last attempt to quit: 09/17/1993    Years since quitting: 24.6  . Smokeless tobacco: Never Used  Substance and Sexual Activity  . Alcohol use: Yes    Comment: occassional  . Drug use: No  . Sexual activity: Yes  Lifestyle  . Physical activity:    Days per week: Not on file    Minutes per session: Not on file  . Stress: Not on file  Relationships  . Social connections:    Talks on phone: Not on file    Gets together: Not on file    Attends religious service: Not on file    Active member of club or organization: Not on file    Attends meetings of clubs or organizations: Not on file    Relationship status: Not on file  . Intimate partner violence:    Fear of current or ex partner: Not on file    Emotionally abused: Not on file    Physically abused: Not on file    Forced sexual activity: Not on file  Other Topics Concern  . Not on file  Social History Narrative  . Not on file     Family History  Problem Relation Age of Onset  . Cancer Mother   . Diabetes Father   . Hypertension Other   . Diabetes Other      Review of Systems: General: negative for chills, fever, night sweats or weight changes.  Cardiovascular: negative for chest pain, dyspnea on exertion, edema, orthopnea, palpitations, paroxysmal nocturnal dyspnea or shortness of breath Dermatological: negative for rash Respiratory: negative for cough or wheezing Urologic: negative for hematuria Abdominal: negative for nausea, vomiting, diarrhea, bright red blood per rectum, melena, or hematemesis Neurologic: negative for  visual changes, syncope, or dizziness All other systems reviewed and are otherwise negative except as noted above.    Blood pressure (!) 104/58, pulse 92, height 5\' 11"  (1.803 m), weight 158 lb (71.7 kg).  General appearance: alert, cooperative, cachectic, no distress and frail Lungs: clear to auscultation bilaterally Heart: regular rate and rhythm and soft systolic mumur AOV Extremities: no edema Skin: pale, warm, dry Neurologic: Grossly normal  EKG NSR-HR 92, incomplete  LBBB  ASSESSMENT AND PLAN:   NSTEMI (non-ST elevated myocardial infarction) ([place]) Troponin peak 4.48 Jan 2020 in the setting of Staph sepsis LVF normal by echo  Sepsis due to Staphylococcus [facility]) Admitted with fever and MS changes 04/19/2018-BC positive for Staph epidermidis-treated with PCII and ABs- ID following  Hx of CABG and AVR 08/22/2010- CABG x 3-L-LAD, S-RI, S-Dx2 [facility] Ease tissue AVR  Anemia, iron deficiency Require occasional transfusion and IV Iron infusions   [facility] notes indicate the possibility of an ischemic work-up as an outpatient but to me the patient appears frail and he is currently asymptomatic.  I suggested we allow him to continue to recover.  If he develops unusual chest pain or shortness of breath we could reconsider an ischemic evaluation.  I will arrange for him to follow-up with Dr. Percival Spanish in [place].  Kerin Ransom PA-C 05/13/2018 10:25 AM

## 2018-05-13 NOTE — Assessment & Plan Note (Signed)
Require occasional transfusion and IV Iron infusions

## 2018-05-13 NOTE — Patient Instructions (Signed)
Medication Instructions:   If you need a refill on your cardiac medications before your next appointment, please call your pharmacy.   Lab work: None  If you have labs (blood work) drawn today and your tests are completely normal, you will receive your results only by: Marland Kitchen MyChart Message (if you have MyChart) OR . A paper copy in the mail If you have any lab test that is abnormal or we need to change your treatment, we will call you to review the results.  Testing/Procedures: None   Follow-Up: At [facility], you and your health needs are our priority.  As part of our continuing mission to provide you with exceptional heart care, we have created designated Provider Care Teams.  These Care Teams include your primary Cardiologist (physician) and Advanced Practice Providers (APPs -  Physician Assistants and Nurse Practitioners) who all work together to provide you with the care you need, when you need it..  Your physician recommends that you schedule a follow-up appointment in: [place].  Any Other Special Instructions Will Be Listed Below (If Applicable).

## 2018-05-14 ENCOUNTER — Ambulatory Visit (HOSPITAL_COMMUNITY): Payer: Medicare HMO

## 2018-05-15 ENCOUNTER — Telehealth: Payer: Self-pay | Admitting: Family Medicine

## 2018-05-15 NOTE — Telephone Encounter (Signed)
Christy called - appt made for MON

## 2018-05-18 ENCOUNTER — Encounter: Payer: Self-pay | Admitting: Family Medicine

## 2018-05-18 ENCOUNTER — Ambulatory Visit (INDEPENDENT_AMBULATORY_CARE_PROVIDER_SITE_OTHER): Payer: Medicare HMO | Admitting: Family Medicine

## 2018-05-18 ENCOUNTER — Encounter (HOSPITAL_COMMUNITY): Payer: Self-pay

## 2018-05-18 ENCOUNTER — Inpatient Hospital Stay (HOSPITAL_COMMUNITY): Payer: Medicare HMO

## 2018-05-18 VITALS — BP 90/41 | HR 82 | Temp 97.5°F | Resp 16

## 2018-05-18 VITALS — BP 99/57 | HR 87 | Temp 96.6°F | Ht 71.0 in | Wt 155.0 lb

## 2018-05-18 DIAGNOSIS — D508 Other iron deficiency anemias: Secondary | ICD-10-CM | POA: Diagnosis not present

## 2018-05-18 DIAGNOSIS — R531 Weakness: Secondary | ICD-10-CM

## 2018-05-18 DIAGNOSIS — R2681 Unsteadiness on feet: Secondary | ICD-10-CM | POA: Diagnosis not present

## 2018-05-18 DIAGNOSIS — D509 Iron deficiency anemia, unspecified: Secondary | ICD-10-CM | POA: Diagnosis not present

## 2018-05-18 DIAGNOSIS — K909 Intestinal malabsorption, unspecified: Secondary | ICD-10-CM | POA: Diagnosis not present

## 2018-05-18 DIAGNOSIS — E876 Hypokalemia: Secondary | ICD-10-CM

## 2018-05-18 DIAGNOSIS — J069 Acute upper respiratory infection, unspecified: Secondary | ICD-10-CM

## 2018-05-18 DIAGNOSIS — K9089 Other intestinal malabsorption: Secondary | ICD-10-CM

## 2018-05-18 LAB — CULTURE, BLOOD (SINGLE)
MICRO NUMBER:: 210618
MICRO NUMBER:: 210658
RESULT: NO GROWTH
Result:: NO GROWTH
SPECIMEN QUALITY:: ADEQUATE
SPECIMEN QUALITY:: ADEQUATE

## 2018-05-18 MED ORDER — SODIUM CHLORIDE 0.9 % IV SOLN
510.0000 mg | Freq: Once | INTRAVENOUS | Status: AC
  Administered 2018-05-18: 510 mg via INTRAVENOUS
  Filled 2018-05-18: qty 17

## 2018-05-18 MED ORDER — SODIUM CHLORIDE 0.9% FLUSH
10.0000 mL | Freq: Once | INTRAVENOUS | Status: AC | PRN
  Administered 2018-05-18: 10 mL

## 2018-05-18 MED ORDER — SODIUM CHLORIDE 0.9 % IV SOLN
Freq: Once | INTRAVENOUS | Status: AC
  Administered 2018-05-18: 13:00:00 via INTRAVENOUS

## 2018-05-18 NOTE — Patient Instructions (Signed)
[facility]  Discharge Instructions:   _______________________________________________________________  Thank you for choosing [facility] at [facility] to provide your oncology and hematology care.  To afford each patient quality time with our providers, please arrive at least 15 minutes before your scheduled appointment.  You need to re-schedule your appointment if you arrive 10 or more minutes late.  We strive to give you quality time with our providers, and arriving late affects you and other patients whose appointments are after yours.  Also, if you no show three or more times for appointments you may be dismissed from the clinic.  Again, thank you for choosing [facility] at [facility]. Our hope is that these requests will allow you access to exceptional care and in a timely manner. _______________________________________________________________  If you have questions after your visit, please contact our office at [phone] between the hours of 8:30 a.m. and 5:00 p.m. Voicemails left after 4:30 p.m. will not be returned until the following business day. _______________________________________________________________  For prescription refill requests, have your pharmacy contact our office. _______________________________________________________________  Recommendations made by the consultant and any test results will be sent to your referring physician. _______________________________________________________________ 

## 2018-05-18 NOTE — Patient Instructions (Signed)
We will get lab work today and make sure that his hematologist receives a copy of this as soon as it is returned He will continue to follow-up with cardiology as planned He will continue to follow-up with hematology as planned and get iron infusions as they recommend For his sinus congestion he will take Mucinex and use nasal saline It is recommended that he take a good multivitamin like Centrum Silver for men. He should continue to drink plenty of water He should reduce his Lasix from 20 mg to 10 mg daily and they should continue to monitor blood pressures at home and any increasing edema with the reduction of the fluid pill.

## 2018-05-18 NOTE — Progress Notes (Signed)
Patient tolerated iron infusion with no complaints voiced. Peripheral site clean and dry with good blood return noted before and after treatment.  Band aid applied.  VSs with discharge and left ambulatory with no s/s of distress noted.

## 2018-05-18 NOTE — Progress Notes (Signed)
Subjective:    Patient ID: [name], male    DOB: 09/05/1932, 85 y.o.   MRN: [id number]  HPI Patient here today for fatigue and weakness. He also complains of nasal drainage.  The daughter brings her father in for the visit today.  He is having more fatigue with decreased energy and weakness.  He is also having some nasal drainage.  We will make sure that we check his electrolytes and most importantly get a CBC.  He seems to always get worse when his hemoglobin drops.  He is still taking metoprolol XL 25 mg.  He is also taking furosemide 20.  The patient had a BMP with an elevated creatinine at 1.542 weeks ago.  His blood sugar is good at 102 and all of the electrolytes including potassium are good.  The CBC at that time had a normal white blood cell count and a hemoglobin that was improved from previously at 11.4.  He was discharged from the hospital after treatment for sepsis.  He had Gram positive cocci.  He was last seen by infectious disease on February 18.  Blood cultures were repeated.  And both of these cultures were negative with no growth after 5 days.  The patient was seen by the cardiology PA on February 19 and it was felt that he needed to continue to recover from the hospitalization and sepsis that he recently experienced.  It is noted that he will need occasional transfusion and IV iron infusions.  The patient is due to get an iron infusion today.  He missed getting his infusion last week.  He denies any chest pain pressure tightness or shortness of breath.  He denies any trouble with swallowing heartburn indigestion nausea vomiting diarrhea blood in the stool or black tarry bowel movements and says he is passing his water without problems.  He is using his walker more regularly at the encouragement of his daughter and caregiver.  He has not had any falls.  He answers all questions appropriately.   Patient Active Problem List   Diagnosis Date Noted  . NSTEMI (non-ST elevated myocardial  infarction) ([place]) 05/13/2018  . Sepsis due to Staphylococcus ([place]) 05/13/2018  . PICC (peripherally inserted central catheter) in place 05/12/2018  . Demand ischemia of myocardium ([place]) 04/20/2018  . Bacteremia 04/20/2018  . AMS (altered mental status) 04/18/2018  . Vomiting 04/16/2018  . Type 2 diabetes mellitus ([place]) 04/16/2018  . Cerebrovascular accident (CVA) ([place]) 02/11/2018  . Dementia without behavioral disturbance ([place]) 01/27/2018  . Falls frequently 01/27/2018  . DNR (do not resuscitate) 01/27/2018  . Diabetes mellitus due to underlying condition with diabetic autonomic neuropathy, without long-term current use of insulin ([place]) 01/21/2018  . Depression 12/26/2017  . Syncope   . Syncope and collapse   . Fall   . Microcytic anemia 12/25/2017  . Dyslipidemia 10/15/2017  . Leg swelling 10/15/2017  . Palpitations 08/21/2016  . HTN (hypertension) 08/21/2016  . Carotid bruit 08/21/2016  . H/O aortic valve replacement 08/21/2016  . Anemia, iron deficiency 04/11/2014  . Orthostatic hypotension 06/22/2012  . Dizzy 06/22/2012  . Precordial pain 09/18/2011  . Unstable angina ([place]) 07/24/2011  . Hx of CABG and AVR 01/30/2011  . COLONIC POLYPS 06/23/2008  . Hyperlipidemia LDL goal <70 06/23/2008  . Aortic valve disorder 06/23/2008  . BENIGN PROSTATIC HYPERTROPHY, HX OF 06/23/2008   Outpatient Encounter Medications as of 05/18/2018  Medication Sig  . ACCU-CHEK SOFTCLIX LANCETS lancets Use as instructed  . aspirin  81 MG tablet Take 1 tablet (81 mg total) by mouth every morning.  . escitalopram (LEXAPRO) 20 MG tablet Take 5 mg by mouth daily.   . furosemide (LASIX) 20 MG tablet Take 1 tablet (20 mg total) by mouth daily. Take 40 mg on Mon, Wed, Fri and 20 mg all other days.  Marland Kitchen glucose blood test strip CHECK BLOOD SUGAR UP TO 4 TIMES A DAY  . metFORMIN (GLUCOPHAGE) 500 MG tablet Take 1 tablet (500 mg total) by mouth 2 (two) times daily.  . metoprolol succinate (TOPROL-XL) 25 MG 24 hr  tablet Take 0.5 tablets (12.5 mg total) by mouth daily before breakfast.  . nitroGLYCERIN (NITROSTAT) 0.4 MG SL tablet Place 1 tablet (0.4 mg total) under the tongue every 5 (five) minutes x 3 doses as needed for chest pain.  . pantoprazole (PROTONIX) 40 MG tablet TAKE (1) TABLET TWICE A DAY BEFOR A MEAL  . polyethylene glycol (MIRALAX / GLYCOLAX) packet Take 17 g by mouth daily.  . risperiDONE (RISPERDAL) 0.25 MG tablet Take 1 tablet (0.25 mg total) by mouth 3 (three) times daily.  . rosuvastatin (CRESTOR) 40 MG tablet Take 1 tablet (40 mg total) by mouth every evening.   No facility-administered encounter medications on file as of 05/18/2018.       Review of Systems  Constitutional: Positive for fatigue.  HENT: Positive for postnasal drip.   Eyes: Negative.   Respiratory: Negative.   Cardiovascular: Negative.   Gastrointestinal: Negative.   Endocrine: Negative.   Genitourinary: Negative.   Musculoskeletal: Negative.   Skin: Negative.   Allergic/Immunologic: Negative.   Neurological: Positive for weakness.  Hematological: Negative.   Psychiatric/Behavioral: Negative.        Objective:   Physical Exam Vitals signs and nursing note reviewed.  Constitutional:      General: He is not in acute distress.    Appearance: Normal appearance. He is well-developed. He is not ill-appearing.  HENT:     Head: Normocephalic and atraumatic.     Right Ear: Tympanic membrane, ear canal and external ear normal. There is no impacted cerumen.     Left Ear: Tympanic membrane, ear canal and external ear normal. There is no impacted cerumen.     Nose: Nose normal. No congestion or rhinorrhea.  Eyes:     General: No scleral icterus.       Right eye: No discharge.        Left eye: No discharge.     Extraocular Movements: Extraocular movements intact.     Conjunctiva/sclera: Conjunctivae normal.     Pupils: Pupils are equal, round, and reactive to light.  Neck:     Musculoskeletal: Normal range  of motion and neck supple.     Thyroid: No thyromegaly.     Vascular: No carotid bruit.     Trachea: No tracheal deviation.  Cardiovascular:     Rate and Rhythm: Normal rate and regular rhythm.     Heart sounds: Normal heart sounds. No murmur.     Comments: The heart is 72/min with a regular rate and rhythm no pedal edema. Pulmonary:     Effort: Pulmonary effort is normal. No respiratory distress.     Breath sounds: Normal breath sounds. No wheezing or rales.     Comments: Clear anteriorly and posteriorly Abdominal:     General: Bowel sounds are normal.     Palpations: Abdomen is soft. There is no mass.     Tenderness: There is no abdominal tenderness.  Musculoskeletal:        General: No tenderness.     Right lower leg: No edema.     Left lower leg: No edema.     Comments: Patient was examined today in the chair.  He is using his walker at the encouragement of his caregiver and daughter and granddaughter.  Lymphadenopathy:     Cervical: No cervical adenopathy.  Skin:    General: Skin is warm and dry.  Neurological:     General: No focal deficit present.     Mental Status: He is alert and oriented to person, place, and time. Mental status is at baseline.     Cranial Nerves: No cranial nerve deficit.     Deep Tendon Reflexes: Reflexes are normal and symmetric.     Comments: Responded appropriately to questions asked of him today.  He has been through a lot over the past year and everything has been worse since his wife died.  Since getting his hemoglobin up and keeping it stable he certainly seems to be stronger and his thinking seems to be much clearer.  Psychiatric:        Mood and Affect: Mood normal.        Behavior: Behavior normal.        Thought Content: Thought content normal.        Judgment: Judgment normal.     Comments: Mood affect and behavior are somewhat flat otherwise he appears stable.    BP (!) 99/57 (BP Location: Left Arm)   Pulse 87   Temp (!) 96.6 F (35.9  C) (Oral)   Ht '5\' 11"'  (1.803 m)   Wt 155 lb (70.3 kg)   BMI 21.62 kg/m         Assessment & Plan:  1. General weakness -According to caregiver and daughter, he seems to be stable with his strength and may be improving slightly. - CBC with Differential/Platelet - CMP14+EGFR - Vitamin B12 - Folate - Iron and TIBC(Labcorp/Sunquest) - Lactate Dehydrogenase - Ferritin  2. Decreased potassium in the blood -We will make sure that all of this blood work is forwarded to his hematologist, Dr. Wendelyn Breslow.... - CMP14+EGFR - Vitamin B12 - Folate - Iron and TIBC([facility]corp/Sunquest) - Lactate Dehydrogenase - Ferritin  3. Other iron deficiency anemia - Vitamin B12 - Folate - Iron and TIBC(Labcorp/Sunquest) - Lactate Dehydrogenase - Ferritin  4. Poor iron absorption - Vitamin B12 - Folate - Iron and TIBC(Labcorp/Sunquest) - Lactate Dehydrogenase - Ferritin  5.  URI -Take Mucinex and use nasal saline regularly and drink plenty of water and fluids  6.  Gait instability -Continue using walker regularly  No orders of the defined types were placed in this encounter.  For the time being, patient will reduce his Lasix from 20-10 daily just because of weakness that he has with arising.  The caregiver and daughter were told of this.  Patient Instructions  We will get lab work today and make sure that his hematologist receives a copy of this as soon as it is returned He will continue to follow-up with cardiology as planned He will continue to follow-up with hematology as planned and get iron infusions as they recommend For his sinus congestion he will take Mucinex and use nasal saline It is recommended that he take a good multivitamin like Centrum Silver for men. He should continue to drink plenty of water He should reduce his Lasix from 20 mg to 10 mg daily and they should continue to monitor  blood pressures at home and any increasing edema with the reduction of the fluid pill.  Arrie Senate MD

## 2018-05-19 ENCOUNTER — Ambulatory Visit: Payer: Medicare HMO | Admitting: Family Medicine

## 2018-05-19 LAB — CMP14+EGFR
ALT: 10 IU/L (ref 0–44)
AST: 24 IU/L (ref 0–40)
Albumin/Globulin Ratio: 1.5 (ref 1.2–2.2)
Albumin: 4.1 g/dL (ref 3.6–4.6)
Alkaline Phosphatase: 91 IU/L (ref 39–117)
BUN/Creatinine Ratio: 13 (ref 10–24)
BUN: 27 mg/dL (ref 8–27)
Bilirubin Total: 0.4 mg/dL (ref 0.0–1.2)
CO2: 27 mmol/L (ref 20–29)
Calcium: 9.9 mg/dL (ref 8.6–10.2)
Chloride: 93 mmol/L — ABNORMAL LOW (ref 96–106)
Creatinine, Ser: 2.04 mg/dL — ABNORMAL HIGH (ref 0.76–1.27)
GFR calc Af Amer: 34 mL/min/{1.73_m2} — ABNORMAL LOW (ref >59)
GFR calc non Af Amer: 29 mL/min/{1.73_m2} — ABNORMAL LOW (ref >59)
GLUCOSE: 117 mg/dL — AB (ref 65–99)
Globulin, Total: 2.7 g/dL (ref 1.5–4.5)
Potassium: 3.8 mmol/L (ref 3.5–5.2)
Sodium: 138 mmol/L (ref 134–144)
Total Protein: 6.8 g/dL (ref 6.0–8.5)

## 2018-05-19 LAB — CBC WITH DIFFERENTIAL/PLATELET
BASOS ABS: 0.1 10*3/uL (ref 0.0–0.2)
BASOS: 1 %
EOS (ABSOLUTE): 0.7 10*3/uL — AB (ref 0.0–0.4)
Eos: 8 %
Hematocrit: 33.3 % — ABNORMAL LOW (ref 37.5–51.0)
Hemoglobin: 11.2 g/dL — ABNORMAL LOW (ref 13.0–17.7)
Immature Grans (Abs): 0 10*3/uL (ref 0.0–0.1)
Immature Granulocytes: 0 %
Lymphocytes Absolute: 2.1 10*3/uL (ref 0.7–3.1)
Lymphs: 27 %
MCH: 30.1 pg (ref 26.6–33.0)
MCHC: 33.6 g/dL (ref 31.5–35.7)
MCV: 90 fL (ref 79–97)
Monocytes Absolute: 0.8 10*3/uL (ref 0.1–0.9)
Monocytes: 10 %
Neutrophils Absolute: 4.1 10*3/uL (ref 1.4–7.0)
Neutrophils: 54 %
PLATELETS: 189 10*3/uL (ref 150–450)
RBC: 3.72 x10E6/uL — ABNORMAL LOW (ref 4.14–5.80)
RDW: 14 % (ref 11.6–15.4)
WBC: 7.8 10*3/uL (ref 3.4–10.8)

## 2018-05-19 LAB — FOLATE: Folate: 10.7 ng/mL (ref >3.0)

## 2018-05-19 LAB — IRON AND TIBC
Iron Saturation: 34 % (ref 15–55)
Iron: 66 ug/dL (ref 38–169)
Total Iron Binding Capacity: 192 ug/dL — ABNORMAL LOW (ref 250–450)
UIBC: 126 ug/dL (ref 111–343)

## 2018-05-19 LAB — FERRITIN: FERRITIN: 566 ng/mL — AB (ref 30–400)

## 2018-05-19 LAB — VITAMIN B12: Vitamin B-12: 454 pg/mL (ref 232–1245)

## 2018-05-19 LAB — LACTATE DEHYDROGENASE: LDH: 278 IU/L — ABNORMAL HIGH (ref 121–224)

## 2018-05-20 ENCOUNTER — Other Ambulatory Visit (HOSPITAL_COMMUNITY): Payer: Medicare HMO

## 2018-05-21 ENCOUNTER — Other Ambulatory Visit: Payer: Self-pay | Admitting: Family Medicine

## 2018-05-21 DIAGNOSIS — F32A Depression, unspecified: Secondary | ICD-10-CM

## 2018-05-21 DIAGNOSIS — F329 Major depressive disorder, single episode, unspecified: Secondary | ICD-10-CM

## 2018-05-26 ENCOUNTER — Ambulatory Visit (HOSPITAL_COMMUNITY): Payer: Medicare HMO | Admitting: Internal Medicine

## 2018-05-29 ENCOUNTER — Ambulatory Visit: Payer: Medicare HMO | Admitting: Family Medicine

## 2018-06-01 ENCOUNTER — Other Ambulatory Visit: Payer: Self-pay | Admitting: *Deleted

## 2018-06-01 NOTE — Patient Outreach (Signed)
Outreach call to pt for telephone assessment, no answer to telephone and received message " voicemail not set up"  RN CM mailed unsuccessful outreach letter to pt home.  PLAN Outreach pt 3-4 business days  [name] [facility], [place], [place] Coordinator [phone]

## 2018-06-02 ENCOUNTER — Ambulatory Visit (HOSPITAL_COMMUNITY): Payer: Medicare HMO | Admitting: Internal Medicine

## 2018-06-04 ENCOUNTER — Other Ambulatory Visit: Payer: Self-pay | Admitting: *Deleted

## 2018-06-04 NOTE — Patient Outreach (Signed)
Outreach call to patient for telephone assessment/  2nd attempt, no answer to telephone and received message " voicemail not set up".  PLAN Outreach pt in 3-4 business days  [name] [facility], [place] [phone]

## 2018-06-06 ENCOUNTER — Telehealth: Payer: Self-pay | Admitting: Family Medicine

## 2018-06-06 NOTE — Telephone Encounter (Signed)
See if a urine can be collected at home and bring it to the office for testing

## 2018-06-06 NOTE — Telephone Encounter (Signed)
See below.  Patient has caregiver who is unable to bring him in today.  Please advise.

## 2018-06-06 NOTE — Telephone Encounter (Signed)
Spoke with caregiver, she is going to try to bring in a urine before we close today.

## 2018-06-07 ENCOUNTER — Encounter (HOSPITAL_COMMUNITY): Payer: Self-pay | Admitting: Emergency Medicine

## 2018-06-07 ENCOUNTER — Emergency Department (HOSPITAL_COMMUNITY)
Admission: EM | Admit: 2018-06-07 | Discharge: 2018-06-08 | Disposition: A | Discharge status: Home / Self Care | Payer: Medicare HMO | Attending: Emergency Medicine | Admitting: Emergency Medicine

## 2018-06-07 ENCOUNTER — Other Ambulatory Visit: Payer: Self-pay

## 2018-06-07 ENCOUNTER — Emergency Department (HOSPITAL_COMMUNITY)
Admission: EM | Admit: 2018-06-07 | Discharge: 2018-06-07 | Disposition: A | Discharge status: Home / Self Care | Payer: Medicare HMO

## 2018-06-07 DIAGNOSIS — I10 Essential (primary) hypertension: Secondary | ICD-10-CM | POA: Insufficient documentation

## 2018-06-07 DIAGNOSIS — I2510 Atherosclerotic heart disease of native coronary artery without angina pectoris: Secondary | ICD-10-CM | POA: Insufficient documentation

## 2018-06-07 DIAGNOSIS — Z96653 Presence of artificial knee joint, bilateral: Secondary | ICD-10-CM | POA: Insufficient documentation

## 2018-06-07 DIAGNOSIS — E119 Type 2 diabetes mellitus without complications: Secondary | ICD-10-CM | POA: Diagnosis not present

## 2018-06-07 DIAGNOSIS — R52 Pain, unspecified: Secondary | ICD-10-CM | POA: Diagnosis not present

## 2018-06-07 DIAGNOSIS — M25552 Pain in left hip: Secondary | ICD-10-CM | POA: Insufficient documentation

## 2018-06-07 DIAGNOSIS — W19XXXA Unspecified fall, initial encounter: Secondary | ICD-10-CM | POA: Diagnosis not present

## 2018-06-07 HISTORY — DX: Type 2 diabetes mellitus without complications: E11.9

## 2018-06-07 NOTE — ED Provider Notes (Signed)
[place] Provider Note   CSN: [id number] Arrival date & time: 06/07/18  1759    History   Chief Complaint Chief Complaint  Patient presents with  . Hip Pain    HPI RODEL GLASPY is a 85 y.o. male.     HPI Patient is presenting with left hip pain.  States he had a fall few days ago.  States he hit it.  States however on the way here the ambulance he had a bump and pain began to feel better.  States he feels better can walk again and does not want any work-up.  No other injury.  No chest pain.  No abdominal pain.  States he just wants to go home "H O M E". Past Medical History:  Diagnosis Date  . Anemia   . Aortic valve disorder   . Arthritis   . Benign prostate hyperplasia   . Carotid bruit   . Colonic polyp   . Coronary artery disease   . Diabetes mellitus without complication ([place])   . Dyslipidemia   . Essential hypertension   . Leg swelling   . Orthostatic hypotension   . Palpitations   . Precordial pain   . Unstable angina (H[facility])     There are no active problems to display for this patient.   Past Surgical History:  Procedure Laterality Date  . AORTIC VALVE REPLACEMENT    . COLONOSCOPY WITH PROPOFOL    . CORONARY ARTERY BYPASS GRAFT    . ESOPHAGOGASTRODUODENOSCOPY (EGD) WITH PROPOFOL    . HERNIA REPAIR    . polypectomy    . REPLACEMENT TOTAL KNEE BILATERAL    . TEE WITHOUT CARDIOVERSION          Home Medications    Prior to Admission medications   Not on File    Family History No family history on file.  Social History Social History   Tobacco Use  . Smoking status: Never Smoker  . Smokeless tobacco: Never Used  Substance Use Topics  . Alcohol use: Never    Frequency: Never  . Drug use: Never     Allergies   Patient has no known allergies.   Review of Systems Review of Systems  Constitutional: Negative for appetite change and fever.  Cardiovascular: Negative for chest pain.  Gastrointestinal: Negative  for abdominal pain.  Musculoskeletal:       Left hip pain  Neurological: Negative for seizures and weakness.     Physical Exam Updated Vital Signs BP (!) 103/55 (BP Location: Left Arm)   Pulse 81   Temp (!) 97.4 F (36.3 C) (Oral)   Resp 15   Ht 5\' 11"  (1.803 m)   Wt 69.9 kg   SpO2 99%   BMI 21.48 kg/m   Physical Exam HENT:     Head: Atraumatic.  Neck:     Musculoskeletal: Neck supple.  Cardiovascular:     Rate and Rhythm: Regular rhythm.  Pulmonary:     Breath sounds: No wheezing, rhonchi or rales.  Abdominal:     Tenderness: There is no abdominal tenderness.  Musculoskeletal:        General: Tenderness present.     Comments: Tenderness over left lateral hip.  Good range of motion.  There is a mobile firmness however over the lateral hip.  Skin:    General: Skin is warm.  Neurological:     General: No focal deficit present.     Mental Status: He is alert.  ED Treatments / Results  Labs (all labs ordered are listed, but only abnormal results are displayed) Labs Reviewed - No data to display  EKG None  Radiology No results found.  Procedures Procedures (including critical care time)  Medications Ordered in ED Medications - No data to display   Initial Impression / Assessment and Plan / ED Course  I have reviewed the triage vital signs and the nursing notes.  Pertinent labs & imaging results that were available during my care of the patient were reviewed by me and considered in my medical decision making (see chart for details).        Patient has left hip pain.  Reportedly had a trauma to it.  There is a mobile piece of could be greater trochanter fracture, however patient states the pain is much improved since a bump on the ambulance.  States he does not want x-ray or further work-up and just wants to go home.  Will discharge home.  Final Clinical Impressions(s) / ED Diagnoses   Final diagnoses:  Left hip pain    ED Discharge Orders     None       Davonna Belling, MD 06/07/18 2010

## 2018-06-07 NOTE — Discharge Instructions (Addendum)
Followup with your doctor as needed

## 2018-06-07 NOTE — ED Notes (Signed)
Rip Harbour RN spoke with pt's daughter who requested for the er to let pt stay all night, RN advised daughter that pt was unable to spend the night in the er and that she would need to come and pick pt up, daughter stated that she would try to find a ride for pt,

## 2018-06-07 NOTE — ED Triage Notes (Signed)
Patient brought in ED via EMS from home from left hip pain. Per patient bumped hip on car door 2-3 days ago and had pain since. No shortening or rotation noted. Patient able to ambulate. Per EMS personal patient has small hard area over left pelvic crest with palpitation. Patient states "They hit a bump in the EMS and the pain has stopped." Denies any hip pain at this time.

## 2018-06-07 NOTE — ED Notes (Addendum)
Rn spoke with pt's daughter again who advised that they would not be coming to pick pt up, requesting to have pt evaluated, Rn advised that someone would need to speak with EDP to let the edp know there concerns, family advised that they could not come back to visit pt RN advised that she would allow a visitor for pt so that they could speak with the EDP but that the pt could not be dropped off at the er and just left, If that would to occur that APS would have to be called,

## 2018-06-07 NOTE — ED Notes (Addendum)
Pt wandering in hallway at triage and registration, attempts made to redirect pt, pt cursing at staff, stating " I have been here all day" pt moved to hallway in treatment area for visualization,

## 2018-06-08 ENCOUNTER — Other Ambulatory Visit: Payer: Self-pay

## 2018-06-08 ENCOUNTER — Telehealth: Payer: Self-pay | Admitting: Family Medicine

## 2018-06-08 ENCOUNTER — Ambulatory Visit (INDEPENDENT_AMBULATORY_CARE_PROVIDER_SITE_OTHER): Payer: Medicare HMO | Admitting: Family Medicine

## 2018-06-08 VITALS — BP 103/60 | HR 90 | Temp 96.7°F | Ht 70.0 in | Wt 153.0 lb

## 2018-06-08 DIAGNOSIS — F0150 Vascular dementia without behavioral disturbance: Secondary | ICD-10-CM | POA: Diagnosis not present

## 2018-06-08 DIAGNOSIS — R8290 Unspecified abnormal findings in urine: Secondary | ICD-10-CM

## 2018-06-08 DIAGNOSIS — L89221 Pressure ulcer of left hip, stage 1: Secondary | ICD-10-CM | POA: Diagnosis not present

## 2018-06-08 DIAGNOSIS — R296 Repeated falls: Secondary | ICD-10-CM | POA: Diagnosis not present

## 2018-06-08 NOTE — Patient Instructions (Signed)
I have placed blood orders and orders for urine testing.  Bring the urine in ASAP so we can determine if he has infection and send it for culture.

## 2018-06-08 NOTE — ED Notes (Signed)
Pt D/C with daughter.

## 2018-06-08 NOTE — Progress Notes (Signed)
Subjective: CC: ?UTI PCP: Chipper Herb, MD IEP:PIRJJ [name] is a 85 y.o. male presenting to clinic today for:  1. ?UTI Patient is brought in by his daughter who notes that he has had a 2 week h/o abnormal urine odor with frequent incontinent episodes.  Patient denies any dysuria, hematuria, fevers, abdominal pain.  He went to the emergency department but per her report he declined all checks.  She notes that he has some waxing and waning mental status over the last couple weeks as well.  His gait seems a little bit more unsteady as well.  She wonders if he would benefit from placement in the [facility] for a while.  Additionally, she points to a lesion on his left posterior hip that she would like to have evaluated.  She reports that he sits or lays down quite frequently.  He does not often change positions.   ROS: Per HPI  No Known Allergies Past Medical History:  Diagnosis Date  . Anemia    Iron deficiency  . Aortic stenosis    s/p pericardial AVR  . Aortic valve disorder 06/23/2008   Qualifier: Diagnosis of  By: Mare Ferrari, RMA, Sherri    . Arthritis   . Benign prostatic hypertrophy   . CAD (coronary artery disease) 01/30/2011  . Carotid bruit 08/21/2016  . COLONIC POLYPS 06/23/2008   Qualifier: Diagnosis of  By: Mare Ferrari, RMA, Sherri    . Coronary artery disease    s/p CABG 08/2010    . Diabetes mellitus   . Dyslipidemia 10/15/2017  . Essential hypertension 08/21/2016  . H/O aortic valve replacement 08/21/2016  . Hyperlipidemia   . Hyperlipidemia LDL goal <70 06/23/2008   Qualifier: Diagnosis of  By: Mare Ferrari, RMA, Sherri    . Leg swelling 10/15/2017  . Orthostatic hypotension 06/22/2012  . Palpitations 08/21/2016  . Precordial pain 09/18/2011  . Type 2 diabetes mellitus with hyperlipidemia ([place]) 06/23/2008   Qualifier: Diagnosis of  By: Mare Ferrari, RMA, Sherri    . Unstable angina ([place]) 07/24/2011    Current Outpatient Medications:  .  ACCU-CHEK SOFTCLIX LANCETS lancets, Use as  instructed, Disp: 100 each, Rfl: 12 .  aspirin 81 MG tablet, Take 1 tablet (81 mg total) by mouth every morning., Disp: 30 tablet, Rfl: 11 .  escitalopram (LEXAPRO) 20 MG tablet, Take 5 mg by mouth daily. , Disp: , Rfl:  .  escitalopram (LEXAPRO) 5 MG tablet, Take 1 tablet (5 mg total) by mouth every evening., Disp: 30 tablet, Rfl: 0 .  furosemide (LASIX) 20 MG tablet, Take 1 tablet (20 mg total) by mouth daily. Take 40 mg on Mon, Wed, Fri and 20 mg all other days., Disp: , Rfl:  .  glucose blood test strip, CHECK BLOOD SUGAR UP TO 4 TIMES A DAY, Disp: 100 each, Rfl: 3 .  metFORMIN (GLUCOPHAGE) 500 MG tablet, Take 1 tablet (500 mg total) by mouth 2 (two) times daily., Disp: 60 tablet, Rfl: 11 .  metoprolol succinate (TOPROL-XL) 25 MG 24 hr tablet, Take 0.5 tablets (12.5 mg total) by mouth daily before breakfast., Disp: 45 tablet, Rfl: 3 .  nitroGLYCERIN (NITROSTAT) 0.4 MG SL tablet, Place 1 tablet (0.4 mg total) under the tongue every 5 (five) minutes x 3 doses as needed for chest pain., Disp: 25 tablet, Rfl: prn .  pantoprazole (PROTONIX) 40 MG tablet, TAKE (1) TABLET TWICE A DAY BEFOR A MEAL, Disp: 60 tablet, Rfl: 4 .  polyethylene glycol (MIRALAX / GLYCOLAX) packet, Take  17 g by mouth daily., Disp: 30 each, Rfl: 3 .  risperiDONE (RISPERDAL) 0.25 MG tablet, Take 1 tablet (0.25 mg total) by mouth 3 (three) times daily., Disp: 90 tablet, Rfl: 3 .  rosuvastatin (CRESTOR) 40 MG tablet, Take 1 tablet (40 mg total) by mouth every evening., Disp: 30 tablet, Rfl: 11 Social History   Socioeconomic History  . Marital status: Widowed    Spouse name: Not on file  . Number of children: 4  . Years of education: 59  . Highest education level: 11th grade  Occupational History  . Occupation: Retired  Scientific laboratory technician  . Financial resource strain: Not on file  . Food insecurity:    Worry: Not on file    Inability: Not on file  . Transportation needs:    Medical: Not on file    Non-medical: Not on file   Tobacco Use  . Smoking status: Former Smoker    Types: Cigars    Last attempt to quit: 09/17/1993    Years since quitting: 24.7  . Smokeless tobacco: Never Used  Substance and Sexual Activity  . Alcohol use: Yes    Comment: occassional  . Drug use: No  . Sexual activity: Yes  Lifestyle  . Physical activity:    Days per week: Not on file    Minutes per session: Not on file  . Stress: Not on file  Relationships  . Social connections:    Talks on phone: Not on file    Gets together: Not on file    Attends religious service: Not on file    Active member of club or organization: Not on file    Attends meetings of clubs or organizations: Not on file    Relationship status: Not on file  . Intimate partner violence:    Fear of current or ex partner: Not on file    Emotionally abused: Not on file    Physically abused: Not on file    Forced sexual activity: Not on file  Other Topics Concern  . Not on file  Social History Narrative  . Not on file   Family History  Problem Relation Age of Onset  . Cancer Mother   . Diabetes Father   . Hypertension Other   . Diabetes Other     Objective: Office vital signs reviewed. BP 103/60   Pulse 90   Temp (!) 96.7 F (35.9 C) (Oral)   Ht 5\' 10"  (1.778 m)   Wt 153 lb (69.4 kg)   BMI 21.95 kg/m   Physical Examination:  General: Awake, alert, nontoxic, No acute distress Cardio: regular rate Pulm: normal work of breathing on room air MSK: unsteady gait.  Hold hand rail to ambulate.  Poor core tone.  Requires handrails to get up from seated position. Skin: dry; intact. He has an inflamed, erythematous quarter sized pressure sore (stage 1) on left posterior hip. No skin breakdown. Neuro: AOx3, responds to questioning. Some abrasive behavior towards daughter.  Assessment/ Plan: 85 y.o. male   1. Abnormal urine odor AOx3.  Afebrile. Nontoxic appearing.  Will evaluate for UTI but is unable to provide sample. Offered straight cath but  this was declined.  I sent home with a urine cup and instructions for collection.  Check BMP, CBC.  Treatment pending results.  We discussed reasons to seek medical attention in the ED.  His daughter voiced good understanding. - Urinalysis, Complete; Future - Urine Culture; Future - Basic Metabolic Panel; Future - CBC; Future  2. Vascular dementia without behavioral disturbance ([place]) Concern for possible progression given waxing and waning mental status over the last 2 weeks.  Though cough be secondary to infection.  The family is interested in possible placement at [facility] for a couple of weeks for rehab.  I think this would be reasonable if symptoms are not related to acute infection.  I will cc chart to PCP for consideration of this.  3. Frequent falls As above  4. Pressure injury of left hip, stage 1 We discussed offsetting pressure of hip/ sacrum.  Encouraged frequent position changes.  Consider donut pillow.     Orders Placed This Encounter  Procedures  . Urine Culture    Standing Status:   Future    Standing Expiration Date:   06/08/2019  . Urinalysis, Complete    Standing Status:   Future    Standing Expiration Date:   06/08/2019  . Basic Metabolic Panel    Standing Status:   Future    Standing Expiration Date:   06/08/2019  . CBC    Standing Status:   Future    Standing Expiration Date:   06/08/2019   No orders of the defined types were placed in this encounter.    Janora Norlander, DO [place] [phone]

## 2018-06-08 NOTE — Telephone Encounter (Signed)
Per M[place]i and [place], patient is now being brought in to be seen

## 2018-06-09 ENCOUNTER — Other Ambulatory Visit: Payer: Self-pay | Admitting: Family Medicine

## 2018-06-09 ENCOUNTER — Other Ambulatory Visit: Payer: Medicare HMO

## 2018-06-09 DIAGNOSIS — R8290 Unspecified abnormal findings in urine: Secondary | ICD-10-CM

## 2018-06-09 LAB — URINALYSIS, COMPLETE
Bilirubin, UA: NEGATIVE
Glucose, UA: NEGATIVE
Ketones, UA: NEGATIVE
Leukocytes, UA: NEGATIVE
Nitrite, UA: NEGATIVE
Specific Gravity, UA: 1.015 (ref 1.005–1.030)
Urobilinogen, Ur: 1 mg/dL (ref 0.2–1.0)
pH, UA: 6.5 (ref 5.0–7.5)

## 2018-06-09 LAB — BASIC METABOLIC PANEL
BUN/Creatinine Ratio: 15 (ref 10–24)
BUN: 28 mg/dL — ABNORMAL HIGH (ref 8–27)
CO2: 25 mmol/L (ref 20–29)
CREATININE: 1.91 mg/dL — AB (ref 0.76–1.27)
Calcium: 9 mg/dL (ref 8.6–10.2)
Chloride: 98 mmol/L (ref 96–106)
GFR calc Af Amer: 37 mL/min/{1.73_m2} — ABNORMAL LOW (ref >59)
GFR calc non Af Amer: 32 mL/min/{1.73_m2} — ABNORMAL LOW (ref >59)
Glucose: 129 mg/dL — ABNORMAL HIGH (ref 65–99)
Potassium: 5.4 mmol/L — ABNORMAL HIGH (ref 3.5–5.2)
Sodium: 140 mmol/L (ref 134–144)

## 2018-06-09 LAB — MICROSCOPIC EXAMINATION
Bacteria, UA: NONE SEEN
Renal Epithel, UA: NONE SEEN /hpf

## 2018-06-09 LAB — CBC
HEMOGLOBIN: 10.9 g/dL — AB (ref 13.0–17.7)
Hematocrit: 33.1 % — ABNORMAL LOW (ref 37.5–51.0)
MCH: 29.4 pg (ref 26.6–33.0)
MCHC: 32.9 g/dL (ref 31.5–35.7)
MCV: 89 fL (ref 79–97)
Platelets: 241 10*3/uL (ref 150–450)
RBC: 3.71 x10E6/uL — ABNORMAL LOW (ref 4.14–5.80)
RDW: 13.8 % (ref 11.6–15.4)
WBC: 9.5 10*3/uL (ref 3.4–10.8)

## 2018-06-09 MED ORDER — TAMSULOSIN HCL 0.4 MG PO CAPS: 0.4000 mg | ORAL_CAPSULE | Freq: Every day | ORAL | 0 refills | Status: DC

## 2018-06-10 ENCOUNTER — Other Ambulatory Visit: Payer: Self-pay | Admitting: *Deleted

## 2018-06-10 DIAGNOSIS — F329 Major depressive disorder, single episode, unspecified: Secondary | ICD-10-CM

## 2018-06-10 DIAGNOSIS — F32A Depression, unspecified: Secondary | ICD-10-CM

## 2018-06-10 MED ORDER — ESCITALOPRAM OXALATE 5 MG PO TABS: 5.0000 mg | ORAL_TABLET | Freq: Every evening | ORAL | 1 refills | Status: DC

## 2018-06-10 NOTE — Telephone Encounter (Signed)
Christy called.   She needed advice on nursing home placement. Info given

## 2018-06-10 NOTE — Patient Outreach (Signed)
Outreach call to patient's daughter [name] [facility]) for telephone assessment, no answer to telephone and received message " voicemail not set up"  PLAN Close case in 3-4 business days.  [name] [facility], [place] Coordinator [phone]

## 2018-06-10 NOTE — Telephone Encounter (Signed)
[name] wanting to speak with you.

## 2018-06-11 ENCOUNTER — Other Ambulatory Visit: Payer: Self-pay | Admitting: *Deleted

## 2018-06-11 NOTE — Patient Outreach (Signed)
[facility] [facility]) Care Management  06/11/2018  [name] [birth year] [id number]   Care coordination  [facility] telephonic RN CM received a voice message from [name]'s daughter [name] to call her at his home number CM called and there was not an answer and CM was not able to leave a voice message  CM noted [name] called from her work number [facility] RN CM reached [place] Staff is able to verify HIPAA She informs CM that Mr Card, she and the family are ready for placement for [name]. She inquired about what [employer] would pay for facility placement. CM discussed that [facility] may be able to assist more efficiently than this [facility] RN CM   CM noted Bryn Gulling, [place] RN CM has been attempting to contact the pt and family. CM referred Christy to [employer]. [name] confirms she has a [place] manager from [employer] to include the contact number and will call her    Plan Cm updated Bryn Gulling of the call from [name]'s daughter and the request for placement with referral to [place] In basket message also sent with best number to reach [name]'s daughter Melford Aase. Lavina Hamman, RN, BSN, [place] Coordinator Office number [phone] Mobile number [phone]  Main [facility] number [phone] Fax number [phone]

## 2018-06-12 ENCOUNTER — Other Ambulatory Visit: Payer: Self-pay | Admitting: *Deleted

## 2018-06-12 ENCOUNTER — Other Ambulatory Visit: Payer: Self-pay | Admitting: Family Medicine

## 2018-06-12 MED ORDER — CEPHALEXIN 250 MG PO CAPS: 250.0000 mg | ORAL_CAPSULE | Freq: Three times a day (TID) | ORAL | 0 refills | Status: DC

## 2018-06-12 MED ORDER — CEFDINIR 300 MG PO CAPS: 300.0000 mg | ORAL_CAPSULE | Freq: Every day | ORAL | 0 refills | Status: AC

## 2018-06-12 NOTE — Progress Notes (Signed)
Estimated Creatinine Clearance: 28.8 mL/min (A) (by C-G formula based on SCr of 1.91 mg/dL (H)).

## 2018-06-12 NOTE — Patient Outreach (Addendum)
[place] [facility]) Care Management  06/12/2018  [name] 09-23-1932 [id number]   Care coordination/collaboration with [facility] RN CM  Myna Hidalgo, community [facility] RN CM, consulted with this CM after she was unable to reach [name] daughter, [name] at the listed home and mobile number for her This [facility] RN CM also attempted to reach [name] unsuccessfully on 06/12/18 at the number [phone]) in which she contacted CM at on 06/11/18 On 06/11/18 [name] discussed [name], she and family were interested in facility placement. Previously when [facility] had been involved they had not be ready for assistance with placement  Community [facility] RN CM and this CM discussed this need and difficulty with making contact with [name] and [name] today and previously   Plan [facility] RN CM will refer [name], daughter and family to [facility] for assistance with facility placement Routed noted to primary MD Sent message to embedded RN CM and [facility] at [employer] family practice   Kimberly L. Lavina Hamman, RN, BSN, [place] Coordinator Office number [phone] Mobile number [phone]  Main [facility] number [phone] Fax number [phone]

## 2018-06-13 LAB — URINE CULTURE

## 2018-06-15 ENCOUNTER — Other Ambulatory Visit: Payer: Self-pay | Admitting: *Deleted

## 2018-06-15 NOTE — Patient Outreach (Signed)
Case closure for RN CM services, unable to maintain contact with pt, daughter, Co-worker Joellyn Quails received call from patient's daughter that she is ready for placement for pt and would like assistance of social work, Leisure centre manager placed order for social work.  RN CM mailed case closure letter to pts daughter [name] informing of RN CM closure and social work is now active with pt, RN CM faxed case closure letter for RN CM services to primary MD Dr. Laurance Flatten.   PLAN Case closed for RN CM services [facility] CSW to follow  [name] [facility], [place] Coordinator [phone]

## 2018-06-18 ENCOUNTER — Other Ambulatory Visit: Payer: Self-pay | Admitting: *Deleted

## 2018-06-18 NOTE — Patient Outreach (Signed)
[place] [facility]) Care Management  06/18/2018  [name] Jun 06, 1932 [id number]   CSW was able to make brief contact with patient's daughter, [name] today to try and perform the initial phone assessment on patient, as well as assess and assist with social work needs and services.  HIPAA compliant identifiers verified.  Mrs. [name] admitted that she was unable to talk at the time of CSW's call, as she was working at the time.  Mrs. [name] encouraged CSW to try her again on Friday, June 19, 2018 around 12:00PM.  CSW voiced understanding and was agreeable to this plan.  [name], BSW, MSW, LCSW  Licensed Education officer, environmental Health System  Mailing [place] [address], [place], [place] [address] Physical Address-[address], [place], [place] [address] Toll Free Main # [phone] Fax # [phone] Cell # [phone]  Office # [phone] [name]

## 2018-06-19 ENCOUNTER — Other Ambulatory Visit: Payer: Self-pay | Admitting: *Deleted

## 2018-06-19 NOTE — Patient Outreach (Signed)
[place] [facility]) Care Management  06/19/2018  NOLE ROBEY 25-Jul-1932 [id number]   CSW made an attempt to try and contact patient's daughter, [name] today to perform the initial phone assessment on patient, as well as assess and assist with social work needs and services; however, Mrs. Benjamin Stain was unavailable at the time of CSW's call.  CSW left a HIPAA compliant message for Mrs. Benjamin Stain and is currently awaiting a return call.  CSW will make arrangements to contact Mrs. Benjamin Stain again in 3-4 business days, if a return call is not received from Mrs. Benjamin Stain in the meantime.  Nat Christen, BSW, MSW, LCSW  Licensed Education officer, environmental Health System  Mailing [place] [address], [place], [place] [address] Physical Address[address], [place],  [address] Toll Free Main # [phone] Fax # [phone] Cell # [phone]  Office # [phone] [email or web address]

## 2018-06-22 ENCOUNTER — Ambulatory Visit (HOSPITAL_COMMUNITY): Payer: Medicare HMO | Admitting: Nurse Practitioner

## 2018-06-23 ENCOUNTER — Other Ambulatory Visit: Payer: Self-pay | Admitting: *Deleted

## 2018-06-23 ENCOUNTER — Encounter: Payer: Self-pay | Admitting: *Deleted

## 2018-06-23 NOTE — Patient Outreach (Signed)
[facility]) Care Management  06/23/2018  [name] 10-21-1932 [id number]  CSW received a return call from patient's daughter, [name] today, in response to the HIPAA compliant message left on voicemail for [name] by CSW on Friday, June 19, 2018.  CSW was able to perform the initial phone assessment on patient, as well as assess and assist with social work needs and services.  CSW introduced self, explained role and types of services provided through [employer] Care Management [facility] Care Management).  CSW further explained to [name] that CSW works with patient's RNCM, also with [facility] Care Management, Edd Arbour. CSW then explained the reason for the call, indicating that [name] thought that patient would benefit from social work services and resources to assist with long-term care placement into an assisted living facility.  CSW obtained two HIPAA compliant identifiers from [name], which included patient's name and date of birth.  [name] stated, "I am new at this and really don't know where to start, I just know that we need to get my dad into a facility where they are able to monitor his care".  [name] went on to say that patient currently lives at home alone, but receives in-home caregiver services 7 days per week, from 8:00AM until 5:00PM, each day.  [name] is concerned about patient not receiving care and supervision in the evenings, reporting that he has become increasingly confused and that he is falling a lot. [name] indicated that just this morning, prior to her going into work, she had to stop by patient's home to help him get off the floor, as the caregiver was unable to do so.  [name] reported that just this weekend, patient stated, "I think I am getting really bad off and that I need to go somewhere".  [name] assured CSW that she has already spoken with her two sisters and one brother and that all are in agreement with  placement.    [name] reported that she is patient's Midwife, as well as patient's Lexicographer, and that she manages all of patient's affairs.  [name] further reported that patient only makes a little over 11 hundred dollars per month in [employer], but that he has an IRA, owns his own home and recently purchased a 2019 Chevrolet truck.  Based on patient's income, [name] believes that patient will qualify for Special Assistance Long-Term Care Medicaid through the [facility] Department of Social Services, but realizes that patient will need to sell his home and vehicle, as well as cash in his IRA.  [name] is worried about selling patient's home, as patient has already left in his Will that he would like for his home to go to his son, currently serving an additional 8 years in a [address] in [place].     CSW encouraged [name] to meet with a Medicaid Case Worker at the [facility] of Social Services to discuss further, as well as apply for the Special Assistance Long-Term Care Medicaid, as they would be able to best advise [name] as to what she needs to do.  [name] reported that she will make arrangements to go and apply for the Special Assistance Long-Term Care Medicaid this Friday, June 26, 2018.  CSW also encouraged [name] to coordinate efforts with her two sisters to go and tour assisted living facilities in [place],  before deciding where they would like for patient to reside.  [name] indicated that she may be interested in placing patient at [facility] or [facility], but agreed to get with her sister to go and tour additional assisted living facilities of interest.    In the meantime, CSW agreed to contact patient's Primary Care Physician, Dr. Rudi Heap to request completion of an FL-2 Form, in addition to an order for a TB Skin  Test to be placed.  CSW will then fax patient's FL-2 Form to [facility] and [facility] to pursue bed offers.  [name] indicated that she would be able to transport patient to Dr. Kathi Der office to have the TB Skin Test administered, and then back a few days later to have it read.  CSW will follow-up with [name] again on Tuesday, June 30, 2018, around 11:00AM to continue to assist with long-term care placement arrangements for patient.  CSW was able to ensure that [name] has the correct contact information for CSW, encouraging her to contact CSW directly if additional social work needs arise in the meantime.  Danford Bad, BSW, MSW, LCSW  Licensed Restaurant manager, fast food Health System  Mailing [place] [address] [place], [place] [address] Physical Address-[address], [place], [place] [address] Toll Free Main # [phone] Fax # [phone] Cell # [phone]  Office # [phone] [email or web address]

## 2018-06-25 ENCOUNTER — Ambulatory Visit: Payer: Self-pay | Admitting: *Deleted

## 2018-06-26 ENCOUNTER — Encounter: Payer: Self-pay | Admitting: *Deleted

## 2018-06-26 ENCOUNTER — Other Ambulatory Visit: Payer: Self-pay | Admitting: Family Medicine

## 2018-06-30 ENCOUNTER — Other Ambulatory Visit: Payer: Self-pay | Admitting: *Deleted

## 2018-06-30 NOTE — Patient Outreach (Signed)
[facility]) Care Management  06/30/2018  [name] 23-Sep-1932 [id number]   CSW made an attempt to try and contact patient's daughter, [name] today to follow-up regarding long-term care placement arrangements for patient in an assisted living facility; however, Mrs. [name] was not available at the time of CSW's call.  CSW left a HIPAA compliant message for Mrs. [name] on voicemail and is currently awaiting a return call.  CSW will make a second outreach attempt within the next 3-4 business days, if a return call is not received from Mrs. [name] in the meantime.  [name], BSW, MSW, LCSW  Licensed Education officer, environmental Health System  Mailing [place] [address] [place], [place] [address] Physical Address-[address], [place], [place] [address] Toll Free Main # [phone] Fax # [phone] Cell # [phone]  Office # [phone] [name]

## 2018-07-01 ENCOUNTER — Other Ambulatory Visit: Payer: Self-pay | Admitting: Family Medicine

## 2018-07-02 ENCOUNTER — Telehealth: Payer: Self-pay | Admitting: Cardiology

## 2018-07-02 NOTE — Telephone Encounter (Signed)
Virtual Visit Pre-Appointment Phone Call  Steps For Call:  1. Confirm consent - "In the setting of the current Covid19 crisis, you are scheduled for a (phone or video) visit with your provider on (date) at (time).  Just as we do with many in-office visits, in order for you to participate in this visit, we must obtain consent.  If you'd like, I can send this to your mychart (if signed up) or email for you to review.  Otherwise, I can obtain your verbal consent now.  All virtual visits are billed to your insurance company just like a normal visit would be.  By agreeing to a virtual visit, we'd like you to understand that the technology does not allow for your provider to perform an examination, and thus may limit your provider's ability to fully assess your condition.  Finally, though the technology is pretty good, we cannot assure that it will always work on either your or our end, and in the setting of a video visit, we may have to convert it to a phone-only visit.  In either situation, we cannot ensure that we have a secure connection.  Are you willing to proceed?"  2. Give patient instructions for WebEx download to smartphone as below if video visit  3. Advise patient to be prepared with any vital sign or heart rhythm information, their current medicines, and a piece of paper and pen handy for any instructions they may receive the day of their visit  4. Inform patient they will receive a phone call 15 minutes prior to their appointment time (may be from unknown caller ID) so they should be prepared to answer  5. Confirm that appointment type is correct in Epic appointment notes (video vs telephone)    TELEPHONE CALL NOTE  [name] has been deemed a candidate for a follow-up tele-health visit to limit community exposure during the Covid-19 pandemic. I spoke with the patient via phone to ensure availability of phone/video source, confirm preferred email & phone number, and discuss  instructions and expectations.  I reminded [name] to be prepared with any vital sign and/or heart rhythm information that could potentially be obtained via home monitoring, at the time of his visit. I reminded [name] to expect a phone call at the time of his visit if his visit.  Did the patient verbally acknowledge consent to treatment? Yes  Orinda Kenner 07/02/2018 12:04 PM   DOWNLOADING THE [place], go to [employer] and type in WebEx in the search bar. [place] [employer], the blue/green circle. The app is free but as with any other app downloads, their phone may require them to verify saved payment information or Apple password. The patient does NOT have to create an account.  - If Android, ask patient to go to [employer] and type in WebEx in the search bar. [place] [employer], the blue/green circle. The app is free but as with any other app downloads, their phone may require them to verify saved payment information or Android password. The patient does NOT have to create an account.   CONSENT FOR TELE-HEALTH VISIT - PLEASE REVIEW  I hereby voluntarily request, consent and authorize [facility] and its employed or contracted physicians, physician assistants, nurse practitioners or other licensed health care professionals (the Practitioner), to provide me with telemedicine health care services (the Services") as deemed necessary by the treating Practitioner. I  acknowledge and consent to receive the Services by the Practitioner via telemedicine. I understand that the telemedicine visit will involve communicating with the Practitioner through live audiovisual communication technology and the disclosure of certain medical information by electronic transmission. I acknowledge that I have been given the opportunity to request an in-person assessment or other available alternative prior to the telemedicine visit and am  voluntarily participating in the telemedicine visit.  I understand that I have the right to withhold or withdraw my consent to the use of telemedicine in the course of my care at any time, without affecting my right to future care or treatment, and that the Practitioner or I may terminate the telemedicine visit at any time. I understand that I have the right to inspect all information obtained and/or recorded in the course of the telemedicine visit and may receive copies of available information for a reasonable fee.  I understand that some of the potential risks of receiving the Services via telemedicine include:   Delay or interruption in medical evaluation due to technological equipment failure or disruption;  Information transmitted may not be sufficient (e.g. poor resolution of images) to allow for appropriate medical decision making by the Practitioner; and/or   In rare instances, security protocols could fail, causing a breach of personal health information.  Furthermore, I acknowledge that it is my responsibility to provide information about my medical history, conditions and care that is complete and accurate to the best of my ability. I acknowledge that Practitioner's advice, recommendations, and/or decision may be based on factors not within their control, such as incomplete or inaccurate data provided by me or distortions of diagnostic images or specimens that may result from electronic transmissions. I understand that the practice of medicine is not an exact science and that Practitioner makes no warranties or guarantees regarding treatment outcomes. I acknowledge that I will receive a copy of this consent concurrently upon execution via email to the email address I last provided but may also request a printed copy by calling the office of [facility].    I understand that my insurance will be billed for this visit.   I have read or had this consent read to me.  I understand the  contents of this consent, which adequately explains the benefits and risks of the Services being provided via telemedicine.   I have been provided ample opportunity to ask questions regarding this consent and the Services and have had my questions answered to my satisfaction.  I give my informed consent for the services to be provided through the use of telemedicine in my medical care  By participating in this telemedicine visit I agree to the above.

## 2018-07-06 ENCOUNTER — Other Ambulatory Visit: Payer: Self-pay | Admitting: *Deleted

## 2018-07-06 ENCOUNTER — Encounter: Payer: Self-pay | Admitting: *Deleted

## 2018-07-06 NOTE — Patient Outreach (Signed)
[facility]) Care Management  07/06/2018  MAKS CAVALLERO [birth year] [id number]   CSW made a second attempt to try and contact patient's daughter, [name] today to follow-up regarding long-term care placement arrangements for patient in an assisted living facility.  However, Mrs. Benjamin Stain was not available at the time of CSW's call.  A HIPAA compliant message was left on voicemail for Mrs. Benjamin Stain and CSW is currently awaiting a return call.  CSW will make a third and final outreach attempt to Mrs. Gann within the next 3-4 business days, as well as mail an Economist to patient's home, encouraging patient or Mrs. Benjamin Stain to contact CSW directly if they are interested in continuing to receive social work services through [facility].  Nat Christen, BSW, MSW, LCSW  Licensed Education officer, environmental Health System  Mailing [place] [address] [place], [place] [address] Physical Address-[address], [place], [place] [address] Toll Free Main # [phone] Fax # [phone] Cell # [phone]  Office # [phone] [email or web address]

## 2018-07-07 ENCOUNTER — Other Ambulatory Visit: Payer: Self-pay | Admitting: Family Medicine

## 2018-07-07 ENCOUNTER — Encounter: Payer: Self-pay | Admitting: Cardiology

## 2018-07-07 NOTE — Progress Notes (Signed)
Virtual Visit via Telephone Note   This visit type was conducted due to national recommendations for restrictions regarding the COVID-19 Pandemic (e.g. social distancing) in an effort to limit this patient's exposure and mitigate transmission in our community.  Due to his co-morbid illnesses, this patient is at least at moderate risk for complications without adequate follow up.  This format is felt to be most appropriate for this patient at this time.  The patient did not have access to video technology/had technical difficulties with video requiring transitioning to audio format only (telephone).  All issues noted in this document were discussed and addressed.  No physical exam could be performed with this format.  Please refer to the patient's chart for his  consent to telehealth for [facility].   Evaluation Performed:  Follow-up visit  Date:  07/08/2018   ID:  Jewell, Ryans Sep 19, 1932, MRN [id number]  Patient Location: Home Provider Location: Home  PCP:  System, Provider Not In  Cardiologist:  Minus Breeding, MD  Electrophysiologist:  None   Chief Complaint:  CAD  History of Present Illness:    [name] is a 85 y.o. male for follow up of CAD.  He has a history of CABG x3 as well as a tissue AVR May 2012.    Other medical issues include chronic anemia.  He is frail and has had falls earlier this year.   He has chronic anemia and in January had a transfusion as an outpatient.  A few days later he presented to [facility] with sepsis and mental status changes.  Blood cultures were positive for staph.  A PICC line was placed and the patient was seen by ID and started on systemic antibiotics.  During that admission his troponin was also noted to be elevated, it peaked at 4.48, suspicious for a NSTEMI.  Transthoracic echocardiogram revealed an ejection fraction of 65 to 70%.  TEE showed no evidence of of prosthetic valve endocarditis.  The patient was discharged April 22, 2018.   Hospital notes indicate that further ischemic work-up could be considered as an outpatient. However, it was decided to manage him conservatively.     Since then he is done well.  His blood pressure has been running low in the 90s but he tolerates this.  He denies any cardiovascular symptoms such as presyncope or syncope.  Is not having palpitations.  He denies any chest pain.  He was out working in the yard when I called him.  He says he has been followed up and has had no fevers or evidence of recurrent or continued infection.  He has no shortness of breath, PND or orthopnea.  He has had no weight gain or edema.  The patient does not have symptoms concerning for COVID-19 infection (fever, chills, cough, or new shortness of breath).    Past Medical History:  Diagnosis Date   Anemia    Aortic stenosis    s/p pericardial AVR   Aortic valve disorder    Arthritis    Benign prostatic hypertrophy    CAD (coronary artery disease) 01/30/2011   Carotid bruit    COLONIC POLYPS 06/23/2008   Qualifier: Diagnosis of  By: Mare Ferrari, RMA, Sherri     Coronary artery disease    s/p CABG 08/2010     Diabetes mellitus without complication ([place])    Dyslipidemia 10/15/2017   Essential hypertension 08/21/2016   H/O aortic valve replacement 08/21/2016   Hyperlipidemia LDL goal <70 06/23/2008  Qualifier: Diagnosis of  By: Mare Ferrari, RMA, Sherri     Orthostatic hypotension 06/22/2012   Palpitations 08/21/2016   Type 2 diabetes mellitus with hyperlipidemia ([place]) 06/23/2008   Qualifier: Diagnosis of  By: Mare Ferrari, RMA, Sherri     Unstable angina ([place]) 07/24/2011   Past Surgical History:  Procedure Laterality Date   AORTIC VALVE REPLACEMENT     AORTIC VALVE REPLACEMENT  08/24/10   68mm pericardial tissue valve   COLONOSCOPY WITH PROPOFOL     COLONOSCOPY WITH PROPOFOL N/A 12/28/2017   Procedure: COLONOSCOPY WITH PROPOFOL;  Surgeon: Rogene Houston, MD;  [facility];  Service: Endoscopy;   Laterality: N/A;   CORONARY ARTERY BYPASS GRAFT     CORONARY ARTERY BYPASS GRAFT  08/24/10   LIMA to LAD, SVG to ramus intermediate, SVG to diagonal   ESOPHAGOGASTRODUODENOSCOPY (EGD) WITH PROPOFOL     ESOPHAGOGASTRODUODENOSCOPY (EGD) WITH PROPOFOL N/A 12/28/2017   Procedure: ESOPHAGOGASTRODUODENOSCOPY (EGD) WITH PROPOFOL;  Surgeon: Rogene Houston, MD;  [facility];  Service: Endoscopy;  Laterality: N/A;   HERNIA REPAIR     knee replacements     x 2   polypectomy     POLYPECTOMY  12/28/2017   Procedure: POLYPECTOMY;  Surgeon: Rogene Houston, MD;  [facility];  Service: Endoscopy;;  transverse colon    REPLACEMENT TOTAL KNEE BILATERAL     TEE WITHOUT CARDIOVERSION     TEE WITHOUT CARDIOVERSION N/A 04/21/2018   Procedure: TRANSESOPHAGEAL ECHOCARDIOGRAM (TEE);  Surgeon: Thayer Headings, MD;  Location: [facility] ENDOSCOPY;  Service: Cardiovascular;  Laterality: N/A;     Current Meds  Medication Sig   ACCU-CHEK SOFTCLIX LANCETS lancets Use as instructed   aspirin 81 MG tablet Take 1 tablet (81 mg total) by mouth every morning.   escitalopram (LEXAPRO) 5 MG tablet Take 1 tablet (5 mg total) by mouth every evening.   furosemide (LASIX) 20 MG tablet TAKE 2 TABLETS ON MONDAY, [place]. TAKE 1 TABLET ALL OTHERDAYS.   glucose blood test strip CHECK BLOOD SUGAR UP TO 4 TIMES A DAY   metFORMIN (GLUCOPHAGE) 500 MG tablet Take 1 tablet (500 mg total) by mouth 2 (two) times daily.   metoprolol succinate (TOPROL-XL) 25 MG 24 hr tablet Take 0.5 tablets (12.5 mg total) by mouth daily before breakfast.   nitroGLYCERIN (NITROSTAT) 0.4 MG SL tablet Place 1 tablet (0.4 mg total) under the tongue every 5 (five) minutes x 3 doses as needed for chest pain.   pantoprazole (PROTONIX) 40 MG tablet TAKE (1) TABLET TWICE A DAY BEFOR A MEAL (Patient taking differently: 40 mg 2 (two) times daily. )   polyethylene glycol (MIRALAX / GLYCOLAX) packet Take 17 g by mouth  daily.   risperiDONE (RISPERDAL) 0.25 MG tablet TAKE  (1)  TABLET  THREE TIMES DAILY.   tamsulosin (FLOMAX) 0.4 MG CAPS capsule TAKE (1) CAPSULE DAILY   [DISCONTINUED] escitalopram (LEXAPRO) 20 MG tablet Take 5 mg by mouth daily.      Allergies:   Patient has no known allergies.   Social History   Tobacco Use   Smoking status: Never Smoker   Smokeless tobacco: Never Used  Substance Use Topics   Alcohol use: Yes    Frequency: Never    Comment: occassional   Drug use: No     Family Hx: The patient's family history includes Cancer in his mother; Diabetes in his father and another family member; Hypertension in an other family member.  ROS:  Please see the history of present illness.    As stated in the HPI and negative for all other systems.  Prior CV studies:   The following studies were reviewed today:    Labs/Other Tests and Data Reviewed:    EKG:  No ECG reviewed.  Recent Labs: 08/07/2017: TSH 3.500 04/19/2018: B Natriuretic Peptide 553.6 04/21/2018: Magnesium 1.8 05/18/2018: ALT 10 06/08/2018: BUN 28; Creatinine, Ser 1.91; Hemoglobin 10.9; Platelets 241; Potassium 5.4; Sodium 140   Recent Lipid Panel Lab Results  Component Value Date/Time   CHOL 69 04/19/2018 02:05 AM   CHOL 198 08/07/2017 11:52 AM   TRIG 70 04/19/2018 02:05 AM   TRIG 73 06/05/2015 08:54 AM   HDL 16 (L) 04/19/2018 02:05 AM   HDL 56 08/07/2017 11:52 AM   HDL 53 06/05/2015 08:54 AM   CHOLHDL 4.3 04/19/2018 02:05 AM   LDLCALC 39 04/19/2018 02:05 AM   LDLCALC 124 (H) 08/07/2017 11:52 AM   LDLCALC 69 11/25/2013 08:59 AM    Wt Readings from Last 3 Encounters:  07/08/18 156 lb (70.8 kg)  06/08/18 153 lb (69.4 kg)  06/07/18 154 lb (69.9 kg)     Objective:    Vital Signs:  BP (!) 83/61 (BP Location: Left Arm, Patient Position: Sitting, Cuff Size: Normal)    Pulse (!) 102    Ht 5\' 11"  (1.803 m)    Wt 156 lb (70.8 kg)    BMI 21.76 kg/m      ASSESSMENT & PLAN:    NSTEMI (non-ST  elevated myocardial infarction) ([place]) The plan was to treat this conservatively.   Given the absence of symptoms no further testing is indicated.   CKD III:  Creat recently was 1.91 which was fairly stable since Feb.  No change in therapy.    Sepsis due to Staphylococcus [facility]) He has had resolution of his symptoms.  He had negative follow up blood cultures.  I reviewed ID records.   Hx of CABG and AVR The patient has no new sypmtoms.  No further cardiovascular testing is indicated.  We will continue with aggressive risk reduction and meds as listed.  Anemia, iron deficiency He remains anemic but stable.    COVID-19 Education: The signs and symptoms of COVID-19 were discussed with the patient and how to seek care for testing (follow up with PCP or arrange E-visit).  The importance of social distancing was discussed today.  Time:   Today, I have spent 21 minutes with the patient with telehealth technology discussing the above problems.     Medication Adjustments/Labs and Tests Ordered: Current medicines are reviewed at length with the patient today.  Concerns regarding medicines are outlined above.   Tests Ordered: No orders of the defined types were placed in this encounter.   Medication Changes: No orders of the defined types were placed in this encounter.   Disposition:  Follow up in 4 month(s)  Signed, Minus Breeding, MD  07/08/2018 3:14 PM     [facility]

## 2018-07-08 ENCOUNTER — Encounter: Payer: Self-pay | Admitting: Cardiology

## 2018-07-08 ENCOUNTER — Telehealth (INDEPENDENT_AMBULATORY_CARE_PROVIDER_SITE_OTHER): Payer: Medicare HMO | Admitting: Cardiology

## 2018-07-08 VITALS — BP 83/61 | HR 102 | Ht 71.0 in | Wt 156.0 lb

## 2018-07-08 DIAGNOSIS — N183 Chronic kidney disease, stage 3 unspecified: Secondary | ICD-10-CM | POA: Insufficient documentation

## 2018-07-08 DIAGNOSIS — Z7189 Other specified counseling: Secondary | ICD-10-CM | POA: Insufficient documentation

## 2018-07-08 DIAGNOSIS — I2510 Atherosclerotic heart disease of native coronary artery without angina pectoris: Secondary | ICD-10-CM | POA: Diagnosis not present

## 2018-07-08 DIAGNOSIS — Z952 Presence of prosthetic heart valve: Secondary | ICD-10-CM

## 2018-07-08 NOTE — Patient Instructions (Signed)
Medication Instructions:  The current medical regimen is effective;  continue present plan and medications.  If you need a refill on your cardiac medications before your next appointment, please call your pharmacy.   Follow-Up: Follow up in 4 months with Dr. Percival Spanish in [place].  You will receive a letter in the mail 2 months before you are due.  Please call us when you receive this letter to schedule your follow up appointment.  Thank you for choosing [facility]!!

## 2018-07-13 ENCOUNTER — Other Ambulatory Visit: Payer: Self-pay | Admitting: *Deleted

## 2018-07-13 NOTE — Patient Outreach (Signed)
[facility]) Care Management  07/13/2018  GUIDO COMP 1932-05-26 [id number]    CSW made a third and final attempt to try and contact patient's daughter, [name] today to follow-up regarding long-term care placement arrangements for patient, without success.  A HIPAA compliant message was left for [name] on voicemail.  CSW is currently awaiting a return call.  CSW will proceed with case closure in two business days, if a return call is not received from [name] in the meantime, as required number of phone attempts have been made and an outreach letter was mailed to patient's home allowing 10 business days for a response. Nat Christen, BSW, MSW, LCSW  Licensed Education officer, [facility]  Mailing [place] [address] [place], [place] [address] Physical Address-[address], [place], [place] [address] Toll Free Main # [phone] Fax # [phone] Cell # [phone]  Office # [phone] [email or web address]

## 2018-07-15 ENCOUNTER — Encounter (HOSPITAL_COMMUNITY): Payer: Self-pay

## 2018-07-15 ENCOUNTER — Other Ambulatory Visit: Payer: Self-pay

## 2018-07-15 ENCOUNTER — Inpatient Hospital Stay (HOSPITAL_COMMUNITY)
Admission: EM | Admit: 2018-07-15 | Discharge: 2018-07-21 | DRG: 378 | Disposition: A | Discharge status: Skilled Nursing Facility | Payer: Medicare HMO | Attending: Internal Medicine | Admitting: Internal Medicine

## 2018-07-15 ENCOUNTER — Encounter: Payer: Self-pay | Admitting: *Deleted

## 2018-07-15 ENCOUNTER — Other Ambulatory Visit: Payer: Self-pay | Admitting: *Deleted

## 2018-07-15 DIAGNOSIS — K264 Chronic or unspecified duodenal ulcer with hemorrhage: Principal | ICD-10-CM | POA: Diagnosis present

## 2018-07-15 DIAGNOSIS — Z952 Presence of prosthetic heart valve: Secondary | ICD-10-CM

## 2018-07-15 DIAGNOSIS — D649 Anemia, unspecified: Secondary | ICD-10-CM | POA: Diagnosis not present

## 2018-07-15 DIAGNOSIS — D509 Iron deficiency anemia, unspecified: Secondary | ICD-10-CM | POA: Diagnosis present

## 2018-07-15 DIAGNOSIS — D179 Benign lipomatous neoplasm, unspecified: Secondary | ICD-10-CM | POA: Diagnosis present

## 2018-07-15 DIAGNOSIS — Z809 Family history of malignant neoplasm, unspecified: Secondary | ICD-10-CM | POA: Diagnosis not present

## 2018-07-15 DIAGNOSIS — E119 Type 2 diabetes mellitus without complications: Secondary | ICD-10-CM | POA: Diagnosis not present

## 2018-07-15 DIAGNOSIS — F419 Anxiety disorder, unspecified: Secondary | ICD-10-CM | POA: Diagnosis present

## 2018-07-15 DIAGNOSIS — N183 Chronic kidney disease, stage 3 unspecified: Secondary | ICD-10-CM | POA: Diagnosis present

## 2018-07-15 DIAGNOSIS — K449 Diaphragmatic hernia without obstruction or gangrene: Secondary | ICD-10-CM | POA: Diagnosis not present

## 2018-07-15 DIAGNOSIS — Z951 Presence of aortocoronary bypass graft: Secondary | ICD-10-CM

## 2018-07-15 DIAGNOSIS — K6389 Other specified diseases of intestine: Secondary | ICD-10-CM | POA: Diagnosis not present

## 2018-07-15 DIAGNOSIS — D638 Anemia in other chronic diseases classified elsewhere: Secondary | ICD-10-CM | POA: Diagnosis present

## 2018-07-15 DIAGNOSIS — K644 Residual hemorrhoidal skin tags: Secondary | ICD-10-CM | POA: Diagnosis present

## 2018-07-15 DIAGNOSIS — D62 Acute posthemorrhagic anemia: Secondary | ICD-10-CM | POA: Diagnosis present

## 2018-07-15 DIAGNOSIS — I2510 Atherosclerotic heart disease of native coronary artery without angina pectoris: Secondary | ICD-10-CM | POA: Diagnosis present

## 2018-07-15 DIAGNOSIS — K921 Melena: Secondary | ICD-10-CM | POA: Diagnosis not present

## 2018-07-15 DIAGNOSIS — K922 Gastrointestinal hemorrhage, unspecified: Secondary | ICD-10-CM | POA: Diagnosis present

## 2018-07-15 DIAGNOSIS — Z96653 Presence of artificial knee joint, bilateral: Secondary | ICD-10-CM | POA: Diagnosis present

## 2018-07-15 DIAGNOSIS — K319 Disease of stomach and duodenum, unspecified: Secondary | ICD-10-CM | POA: Diagnosis present

## 2018-07-15 DIAGNOSIS — E1169 Type 2 diabetes mellitus with other specified complication: Secondary | ICD-10-CM | POA: Diagnosis present

## 2018-07-15 DIAGNOSIS — I129 Hypertensive chronic kidney disease with stage 1 through stage 4 chronic kidney disease, or unspecified chronic kidney disease: Secondary | ICD-10-CM | POA: Diagnosis present

## 2018-07-15 DIAGNOSIS — E1122 Type 2 diabetes mellitus with diabetic chronic kidney disease: Secondary | ICD-10-CM | POA: Diagnosis not present

## 2018-07-15 DIAGNOSIS — K219 Gastro-esophageal reflux disease without esophagitis: Secondary | ICD-10-CM | POA: Diagnosis present

## 2018-07-15 DIAGNOSIS — R4182 Altered mental status, unspecified: Secondary | ICD-10-CM | POA: Diagnosis not present

## 2018-07-15 DIAGNOSIS — Z7401 Bed confinement status: Secondary | ICD-10-CM | POA: Diagnosis not present

## 2018-07-15 DIAGNOSIS — R1312 Dysphagia, oropharyngeal phase: Secondary | ICD-10-CM | POA: Diagnosis present

## 2018-07-15 DIAGNOSIS — E785 Hyperlipidemia, unspecified: Secondary | ICD-10-CM | POA: Diagnosis present

## 2018-07-15 DIAGNOSIS — Z7982 Long term (current) use of aspirin: Secondary | ICD-10-CM | POA: Diagnosis not present

## 2018-07-15 DIAGNOSIS — K3189 Other diseases of stomach and duodenum: Secondary | ICD-10-CM | POA: Diagnosis not present

## 2018-07-15 DIAGNOSIS — D631 Anemia in chronic kidney disease: Secondary | ICD-10-CM | POA: Diagnosis present

## 2018-07-15 DIAGNOSIS — I959 Hypotension, unspecified: Secondary | ICD-10-CM | POA: Diagnosis not present

## 2018-07-15 DIAGNOSIS — Z7984 Long term (current) use of oral hypoglycemic drugs: Secondary | ICD-10-CM | POA: Diagnosis not present

## 2018-07-15 DIAGNOSIS — Z66 Do not resuscitate: Secondary | ICD-10-CM | POA: Diagnosis present

## 2018-07-15 DIAGNOSIS — N400 Enlarged prostate without lower urinary tract symptoms: Secondary | ICD-10-CM | POA: Diagnosis present

## 2018-07-15 DIAGNOSIS — R531 Weakness: Secondary | ICD-10-CM | POA: Diagnosis not present

## 2018-07-15 DIAGNOSIS — I252 Old myocardial infarction: Secondary | ICD-10-CM

## 2018-07-15 DIAGNOSIS — R5381 Other malaise: Secondary | ICD-10-CM | POA: Diagnosis not present

## 2018-07-15 DIAGNOSIS — I10 Essential (primary) hypertension: Secondary | ICD-10-CM | POA: Diagnosis not present

## 2018-07-15 DIAGNOSIS — F0390 Unspecified dementia without behavioral disturbance: Secondary | ICD-10-CM | POA: Diagnosis present

## 2018-07-15 DIAGNOSIS — K5750 Diverticulosis of both small and large intestine without perforation or abscess without bleeding: Secondary | ICD-10-CM | POA: Diagnosis not present

## 2018-07-15 DIAGNOSIS — Z833 Family history of diabetes mellitus: Secondary | ICD-10-CM

## 2018-07-15 DIAGNOSIS — K633 Ulcer of intestine: Secondary | ICD-10-CM | POA: Diagnosis not present

## 2018-07-15 DIAGNOSIS — R58 Hemorrhage, not elsewhere classified: Secondary | ICD-10-CM | POA: Diagnosis not present

## 2018-07-15 DIAGNOSIS — M6281 Muscle weakness (generalized): Secondary | ICD-10-CM | POA: Diagnosis not present

## 2018-07-15 HISTORY — DX: Disorder of kidney and ureter, unspecified: N28.9

## 2018-07-15 LAB — TYPE AND SCREEN
ABO/RH(D): O POS
Antibody Screen: NEGATIVE

## 2018-07-15 LAB — CBC WITH DIFFERENTIAL/PLATELET
Abs Immature Granulocytes: 0.02 10*3/uL (ref 0.00–0.07)
Basophils Absolute: 0 10*3/uL (ref 0.0–0.1)
Basophils Relative: 0 %
Eosinophils Absolute: 0.5 10*3/uL (ref 0.0–0.5)
Eosinophils Relative: 6 %
HCT: 29.1 % — ABNORMAL LOW (ref 39.0–52.0)
Hemoglobin: 9.6 g/dL — ABNORMAL LOW (ref 13.0–17.0)
Immature Granulocytes: 0 %
Lymphocytes Relative: 16 %
Lymphs Abs: 1.2 10*3/uL (ref 0.7–4.0)
MCH: 31.4 pg (ref 26.0–34.0)
MCHC: 33 g/dL (ref 30.0–36.0)
MCV: 95.1 fL (ref 80.0–100.0)
Monocytes Absolute: 0.7 10*3/uL (ref 0.1–1.0)
Monocytes Relative: 9 %
Neutro Abs: 5.2 10*3/uL (ref 1.7–7.7)
Neutrophils Relative %: 69 %
Platelets: 176 10*3/uL (ref 150–400)
RBC: 3.06 MIL/uL — ABNORMAL LOW (ref 4.22–5.81)
RDW: 14.3 % (ref 11.5–15.5)
WBC: 7.6 10*3/uL (ref 4.0–10.5)
nRBC: 0 % (ref 0.0–0.2)

## 2018-07-15 LAB — COMPREHENSIVE METABOLIC PANEL
ALT: 13 U/L (ref 0–44)
AST: 23 U/L (ref 15–41)
Albumin: 3.2 g/dL — ABNORMAL LOW (ref 3.5–5.0)
Alkaline Phosphatase: 72 U/L (ref 38–126)
Anion gap: 10 (ref 5–15)
BUN: 23 mg/dL (ref 8–23)
CO2: 24 mmol/L (ref 22–32)
Calcium: 8.4 mg/dL — ABNORMAL LOW (ref 8.9–10.3)
Chloride: 101 mmol/L (ref 98–111)
Creatinine, Ser: 1.53 mg/dL — ABNORMAL HIGH (ref 0.61–1.24)
GFR calc Af Amer: 48 mL/min — ABNORMAL LOW (ref >60)
GFR calc non Af Amer: 41 mL/min — ABNORMAL LOW (ref >60)
Glucose, Bld: 119 mg/dL — ABNORMAL HIGH (ref 70–99)
Potassium: 3.7 mmol/L (ref 3.5–5.1)
Sodium: 135 mmol/L (ref 135–145)
Total Bilirubin: 0.7 mg/dL (ref 0.3–1.2)
Total Protein: 6.2 g/dL — ABNORMAL LOW (ref 6.5–8.1)

## 2018-07-15 LAB — PROTIME-INR
INR: 1 (ref 0.8–1.2)
Prothrombin Time: 13.4 seconds (ref 11.4–15.2)

## 2018-07-15 LAB — MRSA PCR SCREENING: MRSA by PCR: NEGATIVE

## 2018-07-15 LAB — POC OCCULT BLOOD, ED: Fecal Occult Bld: POSITIVE — AB

## 2018-07-15 LAB — GLUCOSE, CAPILLARY
Glucose-Capillary: 109 mg/dL — ABNORMAL HIGH (ref 70–99)
Glucose-Capillary: 158 mg/dL — ABNORMAL HIGH (ref 70–99)

## 2018-07-15 MED ORDER — ACETAMINOPHEN 650 MG RE SUPP: 650.0000 mg | Freq: Four times a day (QID) | RECTAL | Status: DC | PRN

## 2018-07-15 MED ORDER — HALOPERIDOL LACTATE 5 MG/ML IJ SOLN
2.0000 mg | Freq: Four times a day (QID) | INTRAMUSCULAR | Status: DC | PRN
  Administered 2018-07-15 – 2018-07-16 (×2): 2 mg via INTRAVENOUS
  Filled 2018-07-15 (×2): qty 1

## 2018-07-15 MED ORDER — INSULIN ASPART 100 UNIT/ML ~~LOC~~ SOLN: 0.0000 [IU] | Freq: Every day | SUBCUTANEOUS | Status: DC

## 2018-07-15 MED ORDER — RISPERIDONE 0.25 MG PO TABS
0.2500 mg | ORAL_TABLET | Freq: Once | ORAL | Status: AC
  Administered 2018-07-15: 15:00:00 0.25 mg via ORAL
  Filled 2018-07-15: qty 1

## 2018-07-15 MED ORDER — ONDANSETRON HCL 4 MG PO TABS: 4.0000 mg | ORAL_TABLET | Freq: Four times a day (QID) | ORAL | Status: DC | PRN

## 2018-07-15 MED ORDER — ACETAMINOPHEN 325 MG PO TABS
650.0000 mg | ORAL_TABLET | Freq: Four times a day (QID) | ORAL | Status: DC | PRN
  Administered 2018-07-16 – 2018-07-20 (×3): 650 mg via ORAL
  Filled 2018-07-15 (×3): qty 2

## 2018-07-15 MED ORDER — ESCITALOPRAM OXALATE 10 MG PO TABS
5.0000 mg | ORAL_TABLET | Freq: Every evening | ORAL | Status: DC
  Administered 2018-07-16 – 2018-07-20 (×5): 5 mg via ORAL
  Filled 2018-07-15 (×5): qty 1

## 2018-07-15 MED ORDER — RISPERIDONE 0.5 MG PO TABS
0.2500 mg | ORAL_TABLET | Freq: Three times a day (TID) | ORAL | Status: DC
  Administered 2018-07-15 – 2018-07-20 (×13): 0.25 mg via ORAL
  Filled 2018-07-15 (×13): qty 1

## 2018-07-15 MED ORDER — INSULIN ASPART 100 UNIT/ML ~~LOC~~ SOLN
0.0000 [IU] | Freq: Three times a day (TID) | SUBCUTANEOUS | Status: DC
  Administered 2018-07-19 – 2018-07-20 (×2): 1 [IU] via SUBCUTANEOUS
  Administered 2018-07-21: 13:00:00 2 [IU] via SUBCUTANEOUS
  Administered 2018-07-21: 1 [IU] via SUBCUTANEOUS

## 2018-07-15 MED ORDER — LACTATED RINGERS IV SOLN
INTRAVENOUS | Status: AC
  Administered 2018-07-15 – 2018-07-16 (×3): via INTRAVENOUS

## 2018-07-15 MED ORDER — NITROGLYCERIN 0.4 MG SL SUBL: 0.4000 mg | SUBLINGUAL_TABLET | SUBLINGUAL | Status: DC | PRN

## 2018-07-15 MED ORDER — ONDANSETRON HCL 4 MG/2ML IJ SOLN
4.0000 mg | Freq: Four times a day (QID) | INTRAMUSCULAR | Status: DC | PRN
  Administered 2018-07-15: 4 mg via INTRAVENOUS
  Filled 2018-07-15: qty 2

## 2018-07-15 MED ORDER — PANTOPRAZOLE SODIUM 40 MG IV SOLR
40.0000 mg | Freq: Two times a day (BID) | INTRAVENOUS | Status: DC
  Administered 2018-07-15 – 2018-07-17 (×4): 40 mg via INTRAVENOUS
  Filled 2018-07-15 (×4): qty 40

## 2018-07-15 NOTE — ED Triage Notes (Signed)
Pt brought in by EMS due to dark stools and low blood pressure since Sunday. EMS report sys BP 95. Denies pain, but is weak

## 2018-07-15 NOTE — Progress Notes (Signed)
Spoke with patient's POA Christy, given update on patient condition

## 2018-07-15 NOTE — ED Notes (Signed)
Dr Pickering at bedside 

## 2018-07-15 NOTE — ED Notes (Signed)
Pt is getting very aggressive and upset about having to stay. Have notified Dr. Alvino Chapel. Pt is sliding to the end of the bed.

## 2018-07-15 NOTE — ED Notes (Signed)
Have notified Crystal (daughter) of patient

## 2018-07-15 NOTE — ED Provider Notes (Addendum)
[facility] EMERGENCY DEPARTMENT Provider Note   CSN: [id number] Arrival date & time: 07/15/18  1232    History   Chief Complaint Chief Complaint  Patient presents with  . Melena    HPI [name] is a 85 y.o. male.     HPI Patient with reportedly black stool.  Has low blood pressure here.  Patient is without complaints.  Has a history of anemia.  States he is here because his daughter told him that his stool is black.  He is on sure how long this been black for.  Has had to have previous transfusions for anemia.  Seen Dr. Laural Golden and had upper and lower scopes back in October.  No abdominal pain.  Not on anticoagulation. Past Medical History:  Diagnosis Date  . Anemia   . Aortic stenosis    s/p pericardial AVR  . Aortic valve disorder   . Arthritis   . Benign prostatic hypertrophy   . CAD (coronary artery disease) 01/30/2011  . Carotid bruit   . COLONIC POLYPS 06/23/2008   Qualifier: Diagnosis of  By: Mare Ferrari, RMA, Sherri    . Coronary artery disease    s/p CABG 08/2010    . Diabetes mellitus without complication ([place])   . Dyslipidemia 10/15/2017  . Essential hypertension 08/21/2016  . H/O aortic valve replacement 08/21/2016  . Hyperlipidemia LDL goal <70 06/23/2008   Qualifier: Diagnosis of  By: Mare Ferrari, RMA, Sherri    . Orthostatic hypotension 06/22/2012  . Palpitations 08/21/2016  . Renal disorder    KIDNEY STONE  . Type 2 diabetes mellitus with hyperlipidemia ([place]) 06/23/2008   Qualifier: Diagnosis of  By: Mare Ferrari, RMA, Sherri    . Unstable angina ([place]) 07/24/2011    Patient Active Problem List   Diagnosis Date Noted  . Coronary artery disease involving native coronary artery of native heart without angina pectoris 07/08/2018  . CKD (chronic kidney disease), stage III ([place]) 07/08/2018  . Educated About Covid-19 Virus Infection 07/08/2018  . NSTEMI (non-ST elevated myocardial infarction) ([place]) 05/13/2018  . Sepsis due to Staphylococcus ([place]) 05/13/2018  . PICC  (peripherally inserted central catheter) in place 05/12/2018  . Demand ischemia of myocardium ([place]) 04/20/2018  . Bacteremia 04/20/2018  . AMS (altered mental status) 04/18/2018  . Vomiting 04/16/2018  . Type 2 diabetes mellitus ([place]) 04/16/2018  . Cerebrovascular accident (CVA) ([place]) 02/11/2018  . Dementia without behavioral disturbance ([place]) 01/27/2018  . Falls frequently 01/27/2018  . DNR (do not resuscitate) 01/27/2018  . Diabetes mellitus due to underlying condition with diabetic autonomic neuropathy, without long-term current use of insulin ([place]) 01/21/2018  . Depression 12/26/2017  . Syncope   . Syncope and collapse   . Fall   . Microcytic anemia 12/25/2017  . Dyslipidemia 10/15/2017  . Leg swelling 10/15/2017  . Palpitations 08/21/2016  . HTN (hypertension) 08/21/2016  . Carotid bruit 08/21/2016  . H/O aortic valve replacement 08/21/2016  . Anemia, iron deficiency 04/11/2014  . Orthostatic hypotension 06/22/2012  . Dizzy 06/22/2012  . Precordial pain 09/18/2011  . Unstable angina ([place]) 07/24/2011  . Hx of CABG and AVR 01/30/2011  . COLONIC POLYPS 06/23/2008  . Hyperlipidemia LDL goal <70 06/23/2008  . Aortic valve disorder 06/23/2008  . BENIGN PROSTATIC HYPERTROPHY, HX OF 06/23/2008    Past Surgical History:  Procedure Laterality Date  . AORTIC VALVE REPLACEMENT    . AORTIC VALVE REPLACEMENT  08/24/10   88mm pericardial tissue valve  . COLONOSCOPY WITH PROPOFOL    .  COLONOSCOPY WITH PROPOFOL N/A 12/28/2017   Procedure: COLONOSCOPY WITH PROPOFOL;  Surgeon: Rogene Houston, MD;  [facility];  Service: Endoscopy;  Laterality: N/A;  . CORONARY ARTERY BYPASS GRAFT    . CORONARY ARTERY BYPASS GRAFT  08/24/10   LIMA to LAD, SVG to ramus intermediate, SVG to diagonal  . ESOPHAGOGASTRODUODENOSCOPY (EGD) WITH PROPOFOL    . ESOPHAGOGASTRODUODENOSCOPY (EGD) WITH PROPOFOL N/A 12/28/2017   Procedure: ESOPHAGOGASTRODUODENOSCOPY (EGD) WITH PROPOFOL;  Surgeon: Rogene Houston, MD;  Location: [facility];  Service: Endoscopy;  Laterality: N/A;  . HERNIA REPAIR    . knee replacements     x 2  . polypectomy    . POLYPECTOMY  12/28/2017   Procedure: POLYPECTOMY;  Surgeon: Rogene Houston, MD;  Location: [facility] END[facility];  Service: Endoscopy;;  transverse colon   . REPLACEMENT TOTAL KNEE BILATERAL    . TEE WITHOUT CARDIOVERSION    . TEE WITHOUT CARDIOVERSION N/A 04/21/2018   Procedure: TRANSESOPHAGEAL ECHOCARDIOGRAM (TEE);  Surgeon: Acie Fredrickson Wonda Cheng, MD;  Location: [facility] ENDOSCOPY;  Service: Cardiovascular;  Laterality: N/A;        Home Medications    Prior to Admission medications   Medication Sig Start Date End Date Taking? Authorizing Provider  ACCU-CHEK SOFTCLIX LANCETS lancets Use as instructed 09/15/15  Yes Chipper Herb, MD  aspirin 81 MG tablet Take 1 tablet (81 mg total) by mouth every morning. 12/25/17  Yes Chipper Herb, MD  escitalopram (LEXAPRO) 5 MG tablet Take 1 tablet (5 mg total) by mouth every evening. 06/10/18  Yes Chipper Herb, MD  furosemide (LASIX) 20 MG tablet TAKE 2 TABLETS ON MONDAY, [place]. TAKE 1 TABLET ALL OTHERDAYS. Patient taking differently: Take 20-40 mg by mouth daily. Patient takes 40mg  on Monday, Wednesday, Friday; patient takes 20mg  on all other days 07/07/18  Yes Chipper Herb, MD  glucose blood test strip CHECK BLOOD SUGAR UP TO 4 TIMES A DAY 03/31/18  Yes Chipper Herb, MD  metFORMIN (GLUCOPHAGE) 500 MG tablet Take 1 tablet (500 mg total) by mouth 2 (two) times daily. 12/25/17  Yes Chipper Herb, MD  metoprolol succinate (TOPROL-XL) 25 MG 24 hr tablet Take 0.5 tablets (12.5 mg total) by mouth daily before breakfast. 01/21/18  Yes Chipper Herb, MD  nitroGLYCERIN (NITROSTAT) 0.4 MG SL tablet Place 1 tablet (0.4 mg total) under the tongue every 5 (five) minutes x 3 doses as needed for chest pain. 08/21/16  Yes Hochrein, Jeneen Rinks, MD  pantoprazole (PROTONIX) 40 MG tablet TAKE (1) TABLET TWICE A DAY BEFOR A  MEAL Patient taking differently: 40 mg 2 (two) times daily.  07/07/18  Yes Chipper Herb, MD  polyethylene glycol [facility] / Floria Raveling) packet Take 17 g by mouth daily. 02/06/18  Yes Chipper Herb, MD  risperiDONE (RISPERDAL) 0.25 MG tablet TAKE  (1)  TABLET  THREE TIMES DAILY. Patient taking differently: Take 0.25 mg by mouth 3 (three) times daily.  07/02/18  Yes Chipper Herb, MD  rosuvastatin (CRESTOR) 40 MG tablet Take 1 tablet (40 mg total) by mouth every evening. 12/25/17 07/15/18 Yes Chipper Herb, MD  tamsulosin (FLOMAX) 0.4 MG CAPS capsule TAKE (1) CAPSULE DAILY Patient taking differently: Take 0.4 mg by mouth daily.  06/26/18  Yes Janora Norlander, DO    Family History Family History  Problem Relation Age of Onset  . Cancer Mother   . Diabetes Father   . Hypertension Other   .  Diabetes Other     Social History Social History   Tobacco Use  . Smoking status: Never Smoker  . Smokeless tobacco: Never Used  Substance Use Topics  . Alcohol use: Yes    Frequency: Never    Comment: occassional  . Drug use: No     Allergies   Patient has no known allergies.   Review of Systems Review of Systems  Constitutional: Negative for appetite change.  HENT: Negative for congestion.   Respiratory: Negative for shortness of breath.   Cardiovascular: Negative for chest pain.  Gastrointestinal: Positive for blood in stool.  Genitourinary: Negative for flank pain.  Musculoskeletal: Negative for back pain.  Skin: Negative for rash.  Neurological: Negative for weakness.     Physical Exam Updated Vital Signs BP (!) 98/55 (BP Location: Left Arm)   Pulse 76   Temp 97.8 F (36.6 C) (Oral)   Resp 20   Ht 5\' 11"  (1.803 m)   Wt 70.8 kg   SpO2 100%   BMI 21.76 kg/m   Physical Exam Vitals signs and nursing note reviewed.  Constitutional:      Appearance: Normal appearance.  HENT:     Head: Atraumatic.     Mouth/Throat:     Mouth: Mucous membranes are moist.  Neck:      Musculoskeletal: Neck supple.  Cardiovascular:     Rate and Rhythm: Normal rate.  Abdominal:     Tenderness: There is no abdominal tenderness.  Genitourinary:    Comments: Patient has black but not tarry stool. Musculoskeletal:     Right lower leg: No edema.     Left lower leg: No edema.  Skin:    General: Skin is warm.  Neurological:     General: No focal deficit present.      ED Treatments / Results  Labs (all labs ordered are listed, but only abnormal results are displayed) Labs Reviewed  CBC WITH DIFFERENTIAL/PLATELET - Abnormal; Notable for the following components:      Result Value   RBC 3.06 (*)    Hemoglobin 9.6 (*)    HCT 29.1 (*)    All other components within normal limits  COMPREHENSIVE METABOLIC PANEL - Abnormal; Notable for the following components:   Glucose, Bld 119 (*)    Creatinine, Ser 1.53 (*)    Calcium 8.4 (*)    Total Protein 6.2 (*)    Albumin 3.2 (*)    GFR calc non Af Amer 41 (*)    GFR calc Af Amer 48 (*)    All other components within normal limits  POC OCCULT BLOOD, ED - Abnormal; Notable for the following components:   Fecal Occult Bld POSITIVE (*)    All other components within normal limits  PROTIME-INR  TYPE AND SCREEN    EKG None  Radiology No results found.  Procedures Procedures (including critical care time)  Medications Ordered in ED Medications - No data to display   Initial Impression / Assessment and Plan / ED Course  I have reviewed the triage vital signs and the nursing notes.  Pertinent labs & imaging results that were available during my care of the patient were reviewed by me and considered in my medical decision making (see chart for details).       Patient with worsening anemia GI bleed.  Hypotensive but patient feels well.  States he is not lightheaded.  Discussed with Dr. Laural Golden, who will see the patient in consult.  Likely have Giv[place] capsule  tomorrow.  Will admit to hospitalist  Reviewing  records patient apparently has a history of dementia.  Has become more aggressive here.  May require some medications.  Blood pressure has however improved.  Final Clinical Impressions(s) / ED Diagnoses   Final diagnoses:  Gastrointestinal hemorrhage, unspecified gastrointestinal hemorrhage type  Anemia, unspecified type    ED Discharge Orders    None       Davonna Belling, MD 07/15/18 1411    Davonna Belling, MD 07/15/18 1432

## 2018-07-15 NOTE — Patient Outreach (Signed)
[facility]) Care Management  07/15/2018  [name] 1932-06-02 [id number]   CSW will perform a case closure on patient, due to inability to maintain phone contact with patient, despite required number of phone attempts made and outreach letter mailed to patient's home allowing 10 business days for a response.  CSW will fax an update to patient's Primary Care Physician, Dr. Redge Gainer to ensure that they are aware of CSW's involvement with patient's plan of care.  Nat Christen, BSW, MSW, LCSW  Licensed Education officer, environmental [facility]  Mailing [place] [address] [place], [place] [address] Physical Address-[address], [place], [place] [address] Toll Free Main # [phone] Fax # [phone] Cell # [phone]  Office # [phone] [email or web address]

## 2018-07-15 NOTE — H&P (Addendum)
History and Physical    [name] DJS:[id number] DOB: Jun 08, 1932 DOA: 07/15/2018  PCP: Chipper Herb, MD   Patient coming from: Home  Chief Complaint: Melena  HPI: [name] is a 85 y.o. male with medical history significant for CAD with prior CABG/AVR, CKD stage III, type 2 diabetes, dyslipidemia, hypertension, along with diverticulosis with external hemorrhoids, and duodenal ulcerations noted on EGD and colonoscopy on 12/2017.  He was noted to have dark stools at home per the patient's daughter and the patient is unsure how long this is been going on for.  He denies any abdominal pain, nausea, vomiting, diarrhea, or constipation.  No fevers or chills noted.  He denies any lightheadedness, dizziness, chest pain, or shortness of breath.   ED Course: Vital signs are noted to be stable aside from some soft blood pressure readings.  He is having some agitation and demands to go home and will be given some Risperdal per EDP.  He appears to be alert and oriented only x1 to himself and does not recall the year or where he is currently.  He has been seen by GI Dr. Melony Overly who plans to perform capsule study in a.m.  Review of Systems: Cannot be adequately obtained given patient condition.  Past Medical History:  Diagnosis Date  . Anemia   . Aortic stenosis    s/p pericardial AVR  . Aortic valve disorder   . Arthritis   . Benign prostatic hypertrophy   . CAD (coronary artery disease) 01/30/2011  . Carotid bruit   . COLONIC POLYPS 06/23/2008   Qualifier: Diagnosis of  By: Mare Ferrari, RMA, Sherri    . Coronary artery disease    s/p CABG 08/2010    . Diabetes mellitus without complication ([place])   . Dyslipidemia 10/15/2017  . Essential hypertension 08/21/2016  . H/O aortic valve replacement 08/21/2016  . Hyperlipidemia LDL goal <70 06/23/2008   Qualifier: Diagnosis of  By: Mare Ferrari, RMA, Sherri    . Orthostatic hypotension 06/22/2012  . Palpitations 08/21/2016  . Renal disorder    KIDNEY STONE  .  Type 2 diabetes mellitus with hyperlipidemia ([place]) 06/23/2008   Qualifier: Diagnosis of  By: Mare Ferrari, RMA, Sherri    . Unstable angina ([place]) 07/24/2011    Past Surgical History:  Procedure Laterality Date  . AORTIC VALVE REPLACEMENT    . AORTIC VALVE REPLACEMENT  08/24/10   18mm pericardial tissue valve  . COLONOSCOPY WITH PROPOFOL    . COLONOSCOPY WITH PROPOFOL N/A 12/28/2017   Procedure: COLONOSCOPY WITH PROPOFOL;  Surgeon: Rogene Houston, MD;  [facility];  Service: Endoscopy;  Laterality: N/A;  . CORONARY ARTERY BYPASS GRAFT    . CORONARY ARTERY BYPASS GRAFT  08/24/10   LIMA to LAD, SVG to ramus intermediate, SVG to diagonal  . ESOPHAGOGASTRODUODENOSCOPY (EGD) WITH PROPOFOL    . ESOPHAGOGASTRODUODENOSCOPY (EGD) WITH PROPOFOL N/A 12/28/2017   Procedure: ESOPHAGOGASTRODUODENOSCOPY (EGD) WITH PROPOFOL;  Surgeon: Rogene Houston, MD;  [facility];  Service: Endoscopy;  Laterality: N/A;  . HERNIA REPAIR    . knee replacements     x 2  . polypectomy    . POLYPECTOMY  12/28/2017   Procedure: POLYPECTOMY;  Surgeon: Rogene Houston, MD;  [facility];  Service: Endoscopy;;  transverse colon   . REPLACEMENT TOTAL KNEE BILATERAL    . TEE WITHOUT CARDIOVERSION    . TEE WITHOUT CARDIOVERSION N/A 04/21/2018   Procedure: TRANSESOPHAGEAL ECHOCARDIOGRAM (TEE);  Surgeon: Acie Fredrickson,  Wonda Cheng, MD;  Location: [facility] ENDOSCOPY;  Service: Cardiovascular;  Laterality: N/A;     reports that he has never smoked. He has never used smokeless tobacco. He reports current alcohol use. He reports that he does not use drugs.  No Known Allergies  Family History  Problem Relation Age of Onset  . Cancer Mother   . Diabetes Father   . Hypertension Other   . Diabetes Other     Prior to Admission medications   Medication Sig Start Date End Date Taking? Authorizing Provider  ACCU-CHEK SOFTCLIX LANCETS lancets Use as instructed 09/15/15  Yes Chipper Herb, MD  aspirin 81 MG tablet Take  1 tablet (81 mg total) by mouth every morning. 12/25/17  Yes Chipper Herb, MD  escitalopram (LEXAPRO) 5 MG tablet Take 1 tablet (5 mg total) by mouth every evening. 06/10/18  Yes Chipper Herb, MD  furosemide (LASIX) 20 MG tablet TAKE 2 TABLETS ON MONDAY, [place]. TAKE 1 TABLET ALL OTHERDAYS. Patient taking differently: Take 20-40 mg by mouth daily. Patient takes 40mg  on Monday, Wednesday, Friday; patient takes 20mg  on all other days 07/07/18  Yes Chipper Herb, MD  glucose blood test strip CHECK BLOOD SUGAR UP TO 4 TIMES A DAY 03/31/18  Yes Chipper Herb, MD  metFORMIN (GLUCOPHAGE) 500 MG tablet Take 1 tablet (500 mg total) by mouth 2 (two) times daily. 12/25/17  Yes Chipper Herb, MD  metoprolol succinate (TOPROL-XL) 25 MG 24 hr tablet Take 0.5 tablets (12.5 mg total) by mouth daily before breakfast. 01/21/18  Yes Chipper Herb, MD  nitroGLYCERIN (NITROSTAT) 0.4 MG SL tablet Place 1 tablet (0.4 mg total) under the tongue every 5 (five) minutes x 3 doses as needed for chest pain. 08/21/16  Yes Hochrein, Jeneen Rinks, MD  pantoprazole (PROTONIX) 40 MG tablet TAKE (1) TABLET TWICE A DAY BEFOR A MEAL Patient taking differently: 40 mg 2 (two) times daily.  07/07/18  Yes Chipper Herb, MD  polyethylene glycol [facility] / Floria Raveling) packet Take 17 g by mouth daily. 02/06/18  Yes Chipper Herb, MD  risperiDONE (RISPERDAL) 0.25 MG tablet TAKE  (1)  TABLET  THREE TIMES DAILY. Patient taking differently: Take 0.25 mg by mouth 3 (three) times daily.  07/02/18  Yes Chipper Herb, MD  rosuvastatin (CRESTOR) 40 MG tablet Take 1 tablet (40 mg total) by mouth every evening. 12/25/17 07/15/18 Yes Chipper Herb, MD  tamsulosin (FLOMAX) 0.4 MG CAPS capsule TAKE (1) CAPSULE DAILY Patient taking differently: Take 0.4 mg by mouth daily.  06/26/18  Yes Janora Norlander, DO    Physical Exam: Vitals:   07/15/18 1235 07/15/18 1238 07/15/18 1400  BP:  (!) 98/55 102/65  Pulse:  76 77  Resp:  20 (!) 24   Temp:  97.8 F (36.6 C)   TempSrc:  Oral   SpO2:  100% 96%  Weight: 70.8 kg    Height: 5\' 11"  (1.803 m)      Constitutional: NAD, mildly anxious/agitated Vitals:   07/15/18 1235 07/15/18 1238 07/15/18 1400  BP:  (!) 98/55 102/65  Pulse:  76 77  Resp:  20 (!) 24  Temp:  97.8 F (36.6 C)   TempSrc:  Oral   SpO2:  100% 96%  Weight: 70.8 kg    Height: 5\' 11"  (1.803 m)     Eyes: lids and conjunctivae normal ENMT: Mucous membranes are moist.  Neck: normal, supple Respiratory: clear to auscultation bilaterally. Normal respiratory  effort. No accessory muscle use.  Cardiovascular: Regular rate and rhythm, no murmurs. No extremity edema. Abdomen: no tenderness, no distention. Bowel sounds positive.  Musculoskeletal:  No joint deformity upper and lower extremities.   Skin: no rashes, lesions, ulcers.  Psychiatric: Difficult to assess.  Labs on Admission: I have personally reviewed following labs and imaging studies  CBC: Recent Labs  Lab 07/15/18 1322  WBC 7.6  NEUTROABS 5.2  HGB 9.6*  HCT 29.1*  MCV 95.1  PLT 564   Basic Metabolic Panel: Recent Labs  Lab 07/15/18 1322  NA 135  K 3.7  CL 101  CO2 24  GLUCOSE 119*  BUN 23  CREATININE 1.53*  CALCIUM 8.4*   GFR: Estimated Creatinine Clearance: 36.6 mL/min (A) (by C-G formula based on SCr of 1.53 mg/dL (H)). Liver Function Tests: Recent Labs  Lab 07/15/18 1322  AST 23  ALT 13  ALKPHOS 72  BILITOT 0.7  PROT 6.2*  ALBUMIN 3.2*   No results for input(s): LIPASE, AMYLASE in the last 168 hours. No results for input(s): AMMONIA in the last 168 hours. Coagulation Profile: Recent Labs  Lab 07/15/18 1322  INR 1.0   Cardiac Enzymes: No results for input(s): CKTOTAL, CKMB, CKMBINDEX, TROPONINI in the last 168 hours. BNP (last 3 results) No results for input(s): PROBNP in the last 8760 hours. HbA1C: No results for input(s): HGBA1C in the last 72 hours. CBG: No results for input(s): GLUCAP in the last 168  hours. Lipid Profile: No results for input(s): CHOL, HDL, LDLCALC, TRIG, CHOLHDL, LDLDIRECT in the last 72 hours. Thyroid Function Tests: No results for input(s): TSH, T4TOTAL, FREET4, T3FREE, THYROIDAB in the last 72 hours. Anemia Panel: No results for input(s): VITAMINB12, FOLATE, FERRITIN, TIBC, IRON, RETICCTPCT in the last 72 hours. Urine analysis:    Component Value Date/Time   COLORURINE YELLOW 04/18/2018 1316   APPEARANCEUR Clear 06/09/2018 0944   LABSPEC 1.017 04/18/2018 1316   PHURINE 5.0 04/18/2018 1316   GLUCOSEU Negative 06/09/2018 0944   HGBUR LARGE (A) 04/18/2018 1316   BILIRUBINUR Negative 06/09/2018 0944   KETONESUR 20 (A) 04/18/2018 1316   PROTEINUR 2+ (A) 06/09/2018 0944   PROTEINUR 30 (A) 04/18/2018 1316   UROBILINOGEN negative 08/23/2014 1030   UROBILINOGEN 0.2 08/21/2010 1150   NITRITE Negative 06/09/2018 0944   NITRITE NEGATIVE 04/18/2018 1316   LEUKOCYTESUR Negative 06/09/2018 0944    Radiological Exams on Admission: No results found.  Assessment/Plan Principal Problem:   Acute GI bleeding Active Problems:   Hx of CABG and AVR   Anemia, iron deficiency   Type 2 diabetes mellitus (HCC)   CKD (chronic kidney disease), stage III ([place])    1. Acute GI bleed with melena.  Patient noted to have prior EGD on 12/2017 with Dr. Laural Golden who will continue to follow with patient during this admission.  Appreciate evaluation and management with planned capsule study in a.m.  Will start clear liquid diet as recommended and placed on IV fluid along with PPI twice daily.  SCDs for DVT prophylaxis and avoid any blood thinners.  Follow repeat CBC and transfuse as needed.  Monitor closely in stepdown unit given soft blood pressure readings. 2. Anemia-likely acute blood loss.  Continue to monitor repeat CBC and transfuse for hemoglobin less than 7. 3. Type 2 diabetes.  Maintain on SSI and monitor carefully. 4. CKD stage III.  Appears to be stable and will continue to  monitor carefully.  Monitor input and output and IV fluid. 5. History  of CABG/AVR.  Hold aspirin and Crestor for the time being.  Hold Lasix for now. 6. Dementia.  Continue on Risperdal and will also add Haldol as needed for any agitation.   DVT prophylaxis: SCDs Code Status: DNR Family Communication: Spoke with daughter on phone. Disposition Plan: Capsule study in a.m. to evaluate GI bleed Consults called: GI Admission status: Inpatient, stepdown unit   Pratik Darleen Crocker DO [facility] Pager [phone]  If 7PM-7AM, please contact night-coverage [email or web address] Password [facility]  07/15/2018, 2:46 PM

## 2018-07-16 ENCOUNTER — Inpatient Hospital Stay (HOSPITAL_COMMUNITY): Payer: Medicare HMO | Admitting: Anesthesiology

## 2018-07-16 ENCOUNTER — Encounter (HOSPITAL_COMMUNITY): Payer: Self-pay | Admitting: *Deleted

## 2018-07-16 ENCOUNTER — Encounter (HOSPITAL_COMMUNITY)
Admission: EM | Disposition: A | Discharge status: Skilled Nursing Facility | Payer: Self-pay | Source: Home / Self Care | Attending: Internal Medicine

## 2018-07-16 DIAGNOSIS — D509 Iron deficiency anemia, unspecified: Secondary | ICD-10-CM

## 2018-07-16 DIAGNOSIS — K449 Diaphragmatic hernia without obstruction or gangrene: Secondary | ICD-10-CM

## 2018-07-16 DIAGNOSIS — K3189 Other diseases of stomach and duodenum: Secondary | ICD-10-CM

## 2018-07-16 DIAGNOSIS — K921 Melena: Secondary | ICD-10-CM

## 2018-07-16 HISTORY — PX: GIVENS CAPSULE STUDY: SHX5432

## 2018-07-16 HISTORY — PX: ESOPHAGOGASTRODUODENOSCOPY (EGD) WITH PROPOFOL: SHX5813

## 2018-07-16 LAB — BASIC METABOLIC PANEL
Anion gap: 9 (ref 5–15)
BUN: 19 mg/dL (ref 8–23)
CO2: 26 mmol/L (ref 22–32)
Calcium: 8.7 mg/dL — ABNORMAL LOW (ref 8.9–10.3)
Chloride: 101 mmol/L (ref 98–111)
Creatinine, Ser: 1.33 mg/dL — ABNORMAL HIGH (ref 0.61–1.24)
GFR calc Af Amer: 57 mL/min — ABNORMAL LOW (ref >60)
GFR calc non Af Amer: 49 mL/min — ABNORMAL LOW (ref >60)
Glucose, Bld: 96 mg/dL (ref 70–99)
Potassium: 4.3 mmol/L (ref 3.5–5.1)
Sodium: 136 mmol/L (ref 135–145)

## 2018-07-16 LAB — CBC
HCT: 31.4 % — ABNORMAL LOW (ref 39.0–52.0)
Hemoglobin: 10.3 g/dL — ABNORMAL LOW (ref 13.0–17.0)
MCH: 30.8 pg (ref 26.0–34.0)
MCHC: 32.8 g/dL (ref 30.0–36.0)
MCV: 94 fL (ref 80.0–100.0)
Platelets: 177 10*3/uL (ref 150–400)
RBC: 3.34 MIL/uL — ABNORMAL LOW (ref 4.22–5.81)
RDW: 14.2 % (ref 11.5–15.5)
WBC: 7.8 10*3/uL (ref 4.0–10.5)
nRBC: 0 % (ref 0.0–0.2)

## 2018-07-16 LAB — GLUCOSE, CAPILLARY
Glucose-Capillary: 92 mg/dL (ref 70–99)
Glucose-Capillary: 109 mg/dL — ABNORMAL HIGH (ref 70–99)
Glucose-Capillary: 134 mg/dL — ABNORMAL HIGH (ref 70–99)

## 2018-07-16 SURGERY — ESOPHAGOGASTRODUODENOSCOPY (EGD) WITH PROPOFOL
Anesthesia: General

## 2018-07-16 SURGERY — IMAGING PROCEDURE, GI TRACT, INTRALUMINAL, VIA CAPSULE

## 2018-07-16 MED ORDER — SODIUM CHLORIDE 0.9 % IV SOLN: INTRAVENOUS | Status: DC

## 2018-07-16 MED ORDER — PROPOFOL 10 MG/ML IV BOLUS
INTRAVENOUS | Status: AC
  Filled 2018-07-16: qty 20

## 2018-07-16 MED ORDER — KETAMINE HCL 50 MG/5ML IJ SOSY
PREFILLED_SYRINGE | INTRAMUSCULAR | Status: AC
  Filled 2018-07-16: qty 5

## 2018-07-16 MED ORDER — PROPOFOL 500 MG/50ML IV EMUL
INTRAVENOUS | Status: DC | PRN
  Administered 2018-07-16: 100 ug/kg/min via INTRAVENOUS

## 2018-07-16 NOTE — Anesthesia Postprocedure Evaluation (Signed)
Anesthesia Post Note  Patient: Britian R Kelch  Procedure(s) Performed: ESOPHAGOGASTRODUODENOSCOPY (EGD) WITH PROPOFOL (N/A ) GIVENS CAPSULE STUDY (N/A )  Patient location during evaluation: PACU Anesthesia Type: General Level of consciousness: patient cooperative Pain management: pain level controlled Vital Signs Assessment: post-procedure vital signs reviewed and stable Respiratory status: spontaneous breathing, nonlabored ventilation and respiratory function stable Cardiovascular status: blood pressure returned to baseline Postop Assessment: no apparent nausea or vomiting Anesthetic complications: no     Last Vitals:  Vitals:   07/16/18 0945 07/16/18 1109  BP: 115/70 (!) 85/57  Pulse: (!) 101   Resp: (!) 24 (!) 23  Temp: 36.9 C 36.9 C  SpO2: 91% 98%    Last Pain:  Vitals:   07/16/18 1109  TempSrc:   PainSc: Asleep                 Jimie Kuwahara J

## 2018-07-16 NOTE — Op Note (Signed)
[facility] Patient Name: [name] Procedure Date: 07/16/2018 9:31 AM MRN: [id number] Date of Birth: 03-Feb-1933 Attending MD: Hildred Laser , MD CSN: [id number] Age: 85 Admit Type: Inpatient Procedure:                Upper GI endoscopy Indications:              Melena Providers:                Hildred Laser, MD Referring MD:             Heath Lark, DO Medicines:                Propofol per Anesthesia Complications:            No immediate complications. Estimated Blood Loss:     Estimated blood loss: none. Procedure:                Pre-Anesthesia Assessment:                           - Prior to the procedure, a History and Physical                            was performed, and patient medications and                            allergies were reviewed. The patient's tolerance of                            previous anesthesia was also reviewed. The risks                            and benefits of the procedure and the sedation                            options and risks were discussed with the patient.                            All questions were answered, and informed consent                            was obtained. Prior Anticoagulants: The patient has                            taken no previous anticoagulant or antiplatelet                            agents except for aspirin. ASA Grade Assessment:                            III - A patient with severe systemic disease. After                            reviewing the risks and benefits, the patient was  deemed in satisfactory condition to undergo the                            procedure.                           After obtaining informed consent, the endoscope was                            passed under direct vision. Throughout the                            procedure, the patient's blood pressure, pulse, and                            oxygen saturations were monitored continuously. The                     [id number]-H190 ([id number]) scope was introduced through the                            mouth, and advanced to the third part of duodenum.                            The upper GI endoscopy was accomplished without                            difficulty. The patient tolerated the procedure                            well. Scope In: 10:49:56 AM Scope Out: 10:58:58 AM Total Procedure Duration: 0 hours 9 minutes 2 seconds  Findings:      The examined esophagus was normal.      The Z-line was regular and was found 34 cm from the incisors.      A 6 cm hiatal hernia was present.      A single, non-bleeding erosion was found in the gastric antrum. There       were no stigmata of recent bleeding.      The exam of the stomach was otherwise normal.      The duodenal bulb, second portion of the duodenum and third portion of       the duodenum were normal.      Using the endoscope, the video capsule enteroscope was advanced into the       second portion of the duodenum. Impression:               - Normal esophagus.                           - Z-line regular, 34 cm from the incisors.                           - 6 cm hiatal hernia/ moderate size hiatla hernia.                           - Non-bleeding erosive gastropathy.                           -  Normal duodenal bulb, second portion of the                            duodenum and third portion of the duodenum.                           - Successful completion of the Video Capsule                            Enteroscope placement.                           - No specimens collected. Moderate Sedation:      Per Anesthesia Care Recommendation:           - Return patient to ICU for ongoing care.                           - Diabetic (ADA) diet for 3 hours.                           - Continue present medications.                           - Will review study tomorrow morning. Procedure Code(s):        --- Professional ---                            [phone], Esophagogastroduodenoscopy, flexible,                            transoral; diagnostic, including collection of                            specimen(s) by brushing or washing, when performed                            (separate procedure) Diagnosis Code(s):        --- Professional ---                           K44.9, Diaphragmatic hernia without obstruction or                            gangrene                           K31.89, Other diseases of stomach and duodenum                           K92.1, Melena (includes Hematochezia) CPT copyright 2019 American [employer] Association. All rights reserved. The codes documented in this report are preliminary and upon coder review may  be revised to meet current compliance requirements. Hildred Laser, MD Hildred Laser, MD 07/16/2018 11:30:43 AM This report has been signed electronically. Number of Addenda: 0

## 2018-07-16 NOTE — Progress Notes (Signed)
Brief EGD note:  Normal mucosa of the esophagus. Moderate size sliding hiatal hernia. Single antral erosion without stigmata of bleed. Normal D1 D2 and D3. Given capsule placed endoscopically into second part of duodenum with any difficulty.

## 2018-07-16 NOTE — Consult Note (Signed)
Referring Provider: Heath Lark, DO Primary Care Physician:  Chipper Herb, MD Primary Gastroenterologist:  Dr. Laural Golden  Reason for Consultation:    Melena and anemia.  HPI:   History is obtained from patient's chart as below the patient although he does not provide any details.  Patient is 85 year old Caucasian male who was brought to emergency room yesterday after he was noted to have black stool and low blood pressure.  Patient has dementia.  He lives alone and he has sitters round-the-clock. In emergency room he was noted to have black heme positive stool.  His hemoglobin was 9.6 g.  I went to see patient in emergency room yesterday afternoon but he was quite upset and angry and and insisted on going home.  I therefore had to delay my evaluation.  Patient was admitted to ICU.  He was begun on IV fluids and IV pantoprazole.  He has not had any emesis or melena during the night. Patient denies abdominal pain or nausea.  Patient is on low-dose aspirin. According to records his health has been deteriorated and family has asked for him to be placed in a nursing home.  Patient's GI history is as follows. Patient is well-known to me from evaluation in October 2019 when he was admitted with iron deficiency anemia but stool was guaiac negative.  He had EGD and colonoscopy and no definite source was identified.  He received 1 unit of PRBCs. Patient was diagnosed with staph sepsis back in January this year.  ECHO was negative for vegetations.  During that visit he also developed bump in his troponin levels.  He was initially treated at this facility and subsequently transferred to [facility].  He was felt to have non-STEMI and treated medically.  During that admission his hemoglobin dropped to 7.5 and he was given 1 unit of PRBCs.  He apparently had delayed reaction. Patient's hemoglobin peaked to 11.4 on 05/04/2018.  Since then has been gradually dropping.   Past Medical History:  Diagnosis Date   . Anemia   . Aortic stenosis    s/p pericardial AVR  . Aortic valve disorder   . Arthritis   . Benign prostatic hypertrophy   . CAD (coronary artery disease) 01/30/2011  . Carotid bruit   . COLONIC POLYPS 06/23/2008   Qualifier: Diagnosis of  By: Mare Ferrari, RMA, Sherri    . Coronary artery disease    s/p CABG 08/2010    . Diabetes mellitus without complication ([place])   . Dyslipidemia 10/15/2017  . Essential hypertension 08/21/2016  . H/O aortic valve replacement 08/21/2016  . Hyperlipidemia LDL goal <70 06/23/2008   Qualifier: Diagnosis of  By: Mare Ferrari, RMA, Sherri    . Orthostatic hypotension 06/22/2012  . Palpitations 08/21/2016  . Renal disorder    KIDNEY STONE  . Type 2 diabetes mellitus with hyperlipidemia ([place]) 06/23/2008   Qualifier: Diagnosis of  By: Mare Ferrari, RMA, Sherri    . Unstable angina ([place]) 07/24/2011    Past Surgical History:  Procedure Laterality Date  . AORTIC VALVE REPLACEMENT    . AORTIC VALVE REPLACEMENT  08/24/10   18mm pericardial tissue valve  . COLONOSCOPY WITH PROPOFOL    . COLONOSCOPY WITH PROPOFOL N/A 12/28/2017   Procedure: COLONOSCOPY WITH PROPOFOL;  Surgeon: Rogene Houston, MD;  Location: [facility];  Service: Endoscopy;  Laterality: N/A;  . CORONARY ARTERY BYPASS GRAFT    . CORONARY ARTERY BYPASS GRAFT  08/24/10   LIMA to LAD, SVG to ramus intermediate, SVG  to diagonal  . ESOPHAGOGASTRODUODENOSCOPY (EGD) WITH PROPOFOL    . ESOPHAGOGASTRODUODENOSCOPY (EGD) WITH PROPOFOL N/A 12/28/2017   Procedure: ESOPHAGOGASTRODUODENOSCOPY (EGD) WITH PROPOFOL;  Surgeon: Rogene Houston, MD;  Location: [facility];  Service: Endoscopy;  Laterality: N/A;  . HERNIA REPAIR    . knee replacements     x 2  . polypectomy    . POLYPECTOMY  12/28/2017   Procedure: POLYPECTOMY;  Surgeon: Rogene Houston, MD;  Location: [facility];  Service: Endoscopy;;  transverse colon   . REPLACEMENT TOTAL KNEE BILATERAL    . TEE WITHOUT CARDIOVERSION    . TEE WITHOUT CARDIOVERSION N/A  04/21/2018   Procedure: TRANSESOPHAGEAL ECHOCARDIOGRAM (TEE);  Surgeon: Acie Fredrickson Wonda Cheng, MD;  Location: [facility] ENDOSCOPY;  Service: Cardiovascular;  Laterality: N/A;    Prior to Admission medications   Medication Sig Start Date End Date Taking? Authorizing Provider  ACCU-CHEK SOFTCLIX LANCETS lancets Use as instructed 09/15/15  Yes Chipper Herb, MD  aspirin 81 MG tablet Take 1 tablet (81 mg total) by mouth every morning. 12/25/17  Yes Chipper Herb, MD  escitalopram (LEXAPRO) 5 MG tablet Take 1 tablet (5 mg total) by mouth every evening. 06/10/18  Yes Chipper Herb, MD  furosemide (LASIX) 20 MG tablet TAKE 2 TABLETS ON MONDAY, [place]. TAKE 1 TABLET ALL OTHERDAYS. Patient taking differently: Take 20-40 mg by mouth daily. Patient takes 40mg  on Monday, Wednesday, Friday; patient takes 20mg  on all other days 07/07/18  Yes Chipper Herb, MD  glucose blood test strip CHECK BLOOD SUGAR UP TO 4 TIMES A DAY 03/31/18  Yes Chipper Herb, MD  metFORMIN (GLUCOPHAGE) 500 MG tablet Take 1 tablet (500 mg total) by mouth 2 (two) times daily. 12/25/17  Yes Chipper Herb, MD  metoprolol succinate (TOPROL-XL) 25 MG 24 hr tablet Take 0.5 tablets (12.5 mg total) by mouth daily before breakfast. 01/21/18  Yes Chipper Herb, MD  nitroGLYCERIN (NITROSTAT) 0.4 MG SL tablet Place 1 tablet (0.4 mg total) under the tongue every 5 (five) minutes x 3 doses as needed for chest pain. 08/21/16  Yes Hochrein, Jeneen Rinks, MD  pantoprazole (PROTONIX) 40 MG tablet TAKE (1) TABLET TWICE A DAY BEFOR A MEAL Patient taking differently: 40 mg 2 (two) times daily.  07/07/18  Yes Chipper Herb, MD  polyethylene glycol [facility] / Floria Raveling) packet Take 17 g by mouth daily. 02/06/18  Yes Chipper Herb, MD  risperiDONE (RISPERDAL) 0.25 MG tablet TAKE  (1)  TABLET  THREE TIMES DAILY. Patient taking differently: Take 0.25 mg by mouth 3 (three) times daily.  07/02/18  Yes Chipper Herb, MD  rosuvastatin (CRESTOR) 40 MG tablet Take 1  tablet (40 mg total) by mouth every evening. 12/25/17 07/15/18 Yes Chipper Herb, MD  tamsulosin (FLOMAX) 0.4 MG CAPS capsule TAKE (1) CAPSULE DAILY Patient taking differently: Take 0.4 mg by mouth daily.  06/26/18  Yes Ronnie Doss M, DO    Current Facility-Administered Medications  Medication Dose Route Frequency Provider Last Rate Last Dose  . acetaminophen (TYLENOL) tablet 650 mg  650 mg Oral Q6H PRN Manuella Ghazi, Pratik D, DO       Or  . acetaminophen (TYLENOL) suppository 650 mg  650 mg Rectal Q6H PRN Manuella Ghazi, Pratik D, DO      . escitalopram (LEXAPRO) tablet 5 mg  5 mg Oral QPM Shah, Pratik D, DO      . haloperidol lactate (HALDOL) injection 2 mg  2 mg  Intravenous Q6H PRN Heath Lark D, DO   2 mg at 07/15/18 1535  . insulin aspart (novoLOG) injection 0-5 Units  0-5 Units Subcutaneous QHS Manuella Ghazi, Pratik D, DO      . insulin aspart (novoLOG) injection 0-9 Units  0-9 Units Subcutaneous TID WC Shah, Pratik D, DO      . lactated ringers infusion   Intravenous Continuous Manuella Ghazi, Pratik D, DO 75 mL/hr at 07/16/18 0400    . nitroGLYCERIN (NITROSTAT) SL tablet 0.4 mg  0.4 mg Sublingual Q5 Min x 3 PRN Manuella Ghazi, Pratik D, DO      . ondansetron (ZOFRAN) tablet 4 mg  4 mg Oral Q6H PRN Manuella Ghazi, Pratik D, DO       Or  . ondansetron (ZOFRAN) injection 4 mg  4 mg Intravenous Q6H PRN Manuella Ghazi, Pratik D, DO   4 mg at 07/15/18 1616  . pantoprazole (PROTONIX) injection 40 mg  40 mg Intravenous Q12H Shah, Pratik D, DO   40 mg at 07/15/18 2127  . risperiDONE (RISPERDAL) tablet 0.25 mg  0.25 mg Oral TID Heath Lark D, DO   0.25 mg at 07/15/18 2128    Allergies as of 07/15/2018  . (No Known Allergies)    Family History  Problem Relation Age of Onset  . Cancer Mother   . Diabetes Father   . Hypertension Other   . Diabetes Other     Social History   Socioeconomic History  . Marital status: Widowed    Spouse name: Not on file  . Number of children: 4  . Years of education: 18  . Highest education level: 11th grade   Occupational History  . Occupation: Retired  Scientific laboratory technician  . Financial resource strain: Not on file  . Food insecurity:    Worry: Not on file    Inability: Not on file  . Transportation needs:    Medical: Not on file    Non-medical: Not on file  Tobacco Use  . Smoking status: Never Smoker  . Smokeless tobacco: Never Used  Substance and Sexual Activity  . Alcohol use: Yes    Frequency: Never    Comment: occassional  . Drug use: No  . Sexual activity: Yes  Lifestyle  . Physical activity:    Days per week: Not on file    Minutes per session: Not on file  . Stress: Not on file  Relationships  . Social connections:    Talks on phone: Not on file    Gets together: Not on file    Attends religious service: Not on file    Active member of club or organization: Not on file    Attends meetings of clubs or organizations: Not on file    Relationship status: Not on file  . Intimate partner violence:    Fear of current or ex partner: Not on file    Emotionally abused: Not on file    Physically abused: Not on file    Forced sexual activity: Not on file  Other Topics Concern  . Not on file  Social History Narrative   ** Merged History Encounter **        Review of Systems: See HPI, otherwise normal ROS  Physical Exam: Temp:  [97.5 F (36.4 C)-99.4 F (37.4 C)] 99.4 F (37.4 C) (04/23 0400) Pulse Rate:  [76-101] 98 (04/23 0500) Resp:  [11-24] 21 (04/23 0500) BP: (98-127)/(55-94) 104/63 (04/23 0500) SpO2:  [90 %-100 %] 97 % (04/23 0500) Weight:  [66.9 kg-70.8  kg] 66.9 kg (04/23 0500) Last BM Date: 07/15/18  Well-developed thin Caucasian male who is in no acute distress. He is quiet and and answers yes or no to questions. Conjunctivae is pale.  Sclerae nonicteric. Pharyngeal mucosa was not examined as he would not open his mouth. No thyromegaly or lymphadenopathy noted. Chest is symmetrical with midsternal scar. Cardiac exam reveals normal S1 and prominent S2.  He has  faint systolic ejection murmur best heard at aortic area. Auscultation of lungs reveal vesicular breath sounds bilaterally.  No rales or rhonchi. Abdomen is scaphoid.  Bowel sounds are normal.  On palpation it soft and nontender with organomegaly or masses. Extremities are thin.  No peripheral edema noted.    Lab Results: Recent Labs    07/15/18 1322 07/16/18 0557  WBC 7.6 7.8  HGB 9.6* 10.3*  HCT 29.1* 31.4*  PLT 176 177   BMET Recent Labs    07/15/18 1322 07/16/18 0557  NA 135 136  K 3.7 4.3  CL 101 101  CO2 24 26  GLUCOSE 119* 96  BUN 23 19  CREATININE 1.53* 1.33*  CALCIUM 8.4* 8.7*   LFT Recent Labs    07/15/18 1322  PROT 6.2*  ALBUMIN 3.2*  AST 23  ALT 13  ALKPHOS 72  BILITOT 0.7   PT/INR Recent Labs    07/15/18 1322  LABPROT 13.4  INR 1.0    Assessment;  Patient is 85 year old Caucasian male with multiple medical problems including dementia coronary artery disease status post aortic valve replacement CABG(2012) who was evaluated for iron deficiency anemia in October 2019 by me.  Iron studies revealed low serum ferritin low serum iron and low saturation.  However his stool was guaiac negative.  He underwent EGD and a colonoscopy and no definite lesion was identified.  He presents with melena.  His hemoglobin has now dropped.  In fact it has gone up despite him getting IV fluids.  Therefore I do not believe he is actively bleeding.  Given history of valve disease he possibly has small bowel angiodysplasia.  Given that he had EGD and colonoscopy in October 2019 without finding source of GI bleed it would be reasonable to proceed with small bowel given capsule study. I have talked with Ms. [name] patient's granddaughter who is a power of attorney.  She is agreeable to proceed with this test.  Recommendations;  Small bowel given capsule study today. We will try to repeat study this afternoon.   LOS: 1 day      07/16/2018, 7:54 AM

## 2018-07-16 NOTE — Transfer of Care (Signed)
Immediate Anesthesia Transfer of Care Note  Patient: [name]  Procedure(s) Performed: ESOPHAGOGASTRODUODENOSCOPY (EGD) WITH PROPOFOL (N/A ) GIVENS CAPSULE STUDY (N/A )  Patient Location: PACU  Anesthesia Type:MAC  Level of Consciousness: drowsy  Airway & Oxygen Therapy: Patient Spontanous Breathing and Patient connected to nasal cannula oxygen  Post-op Assessment: Report given to RN and Post -op Vital signs reviewed and stable  Post vital signs: Reviewed and stable  Last Vitals:  Vitals Value Taken Time  BP 85/57 07/16/2018 11:10 AM  Temp    Pulse 79 07/16/2018 11:11 AM  Resp 21 07/16/2018 11:11 AM  SpO2 98 % 07/16/2018 11:11 AM  Vitals shown include unvalidated device data.  Last Pain:  Vitals:   07/16/18 0945  TempSrc: Oral  PainSc:       Patients Stated Pain Goal: 4 (34/19/62 2297)  Complications: No apparent anesthesia complications

## 2018-07-16 NOTE — Progress Notes (Signed)
Patient arrived to unit.  He is drowsy at this time.  Will continue to monitor.

## 2018-07-16 NOTE — Progress Notes (Signed)
Patient unable to swallow given capsule. I went back to ICU and encouraged him 1 more time to swallow it. He is able to swallow water without any difficulty with capsule stays in his mouth. Suspect oropharyngeal dysphagia due to dementia. I have talked with Ms. [name], power of attorney and she requested endoscopy be done for capsule placement in order to find the source of blood loss. We will do so.

## 2018-07-16 NOTE — Anesthesia Preprocedure Evaluation (Signed)
Anesthesia Evaluation  Patient identified by MRN, date of birth, ID band Patient confused    Reviewed: Allergy & Precautions, NPO status , Patient's Chart, lab work & pertinent test results, reviewed documented beta blocker date and time , Unable to perform ROS - Chart review only  Airway Mallampati: II  TM Distance: >3 FB     Dental   Pulmonary    breath sounds clear to auscultation       Cardiovascular hypertension, + CAD (CABG) and + CABG   Rhythm:Regular Rate:Normal     Neuro/Psych    GI/Hepatic   Endo/Other  diabetes, Well Controlled, Type 2  Renal/GU      Musculoskeletal   Abdominal   Peds  Hematology   Anesthesia Other Findings   Reproductive/Obstetrics                             Anesthesia Physical Anesthesia Plan  ASA: III  Anesthesia Plan: General   Post-op Pain Management:    Induction: Intravenous  PONV Risk Score and Plan: 1  Airway Management Planned:   Additional Equipment:   Intra-op Plan:   Post-operative Plan:   Informed Consent: I have reviewed the patients History and Physical, chart, labs and discussed the procedure including the risks, benefits and alternatives for the proposed anesthesia with the patient or authorized representative who has indicated his/her understanding and acceptance.       Plan Discussed with: CRNA and Anesthesiologist  Anesthesia Plan Comments:         Anesthesia Quick Evaluation

## 2018-07-16 NOTE — Progress Notes (Signed)
PROGRESS NOTE    [name]  ZDG:[id number] DOB: [birth year] DOA: 07/15/2018 PCP: [name], MD   Brief Narrative:  Per HPI: [name] is a 85 y.o. male with medical history significant for CAD with prior CABG/AVR, CKD stage III, type 2 diabetes, dyslipidemia, hypertension, along with diverticulosis with external hemorrhoids, and duodenal ulcerations noted on EGD and colonoscopy on 12/2017.  He was noted to have dark stools at home per the patient's daughter and the patient is unsure how long this is been going on for.  He denies any abdominal pain, nausea, vomiting, diarrhea, or constipation.  No fevers or chills noted.  He denies any lightheadedness, dizziness, chest pain, or shortness of breath.  Patient was admitted for evaluation of acute GI bleed with melena.  Plans for small capsule study per GI today.  Patient has had some mild agitation since admission.  Assessment & Plan:   Principal Problem:   Acute GI bleeding Active Problems:   Hx of CABG and AVR   Anemia, iron deficiency   Type 2 diabetes mellitus (HCC)   CKD (chronic kidney disease), stage III (HCC)   GI bleed   1. Acute GI bleed with melena-stable.  Patient noted to have prior EGD on 12/2017 with Dr. Karilyn Cota who will continue to follow with patient during this admission.  Appreciate evaluation and management with planned capsule study today.  Continue on PPI twice daily.  SCDs for DVT prophylaxis and avoid any blood thinners.  Follow repeat CBC and transfuse as needed.    Stable for transfer to MedSurg today.  Plan to restart clear liquid diet this morning per GI. 2. Anemia-likely acute blood loss-stable.  Continue to monitor repeat CBC and transfuse for hemoglobin less than 7. 3. Type 2 diabetes.  Maintain on SSI and monitor carefully. 4. CKD stage III.  Appears to be stable and will continue to monitor carefully.  Monitor input and output and IV fluid. 5. History of CABG/AVR.  Hold aspirin and Crestor for the  time being.  Hold Lasix for now. 6. Dementia.  Continue on Risperdal and will also add Haldol as needed for any agitation.   DVT prophylaxis: SCDs Code Status: DNR Family Communication: Spoke with daughter on phone Disposition Plan: Small capsule study per GI.  Can transfer to MedSurg today.   Consultants:   GI  Procedures:   None  Antimicrobials:   None   Subjective: Patient seen and evaluated today with no new acute complaints or concerns. No acute concerns or events noted overnight.  Mild agitation on admission.  Blood pressures have remained stable.  Objective: Vitals:   07/16/18 0200 07/16/18 0300 07/16/18 0400 07/16/18 0500  BP: 114/66 111/66 115/66 104/63  Pulse: 98 98 97 98  Resp: (!) 21 (!) 21 (!) 21 (!) 21  Temp:   99.4 F (37.4 C)   TempSrc:   Oral   SpO2: 97% 98% 98% 97%  Weight:    66.9 kg  Height:        Intake/Output Summary (Last 24 hours) at 07/16/2018 0750 Last data filed at 07/16/2018 0500 Gross per 24 hour  Intake 1055.3 ml  Output 700 ml  Net 355.3 ml   Filed Weights   07/15/18 1235 07/15/18 1535 07/16/18 0500  Weight: 70.8 kg 67.1 kg 66.9 kg    Examination:  General exam: Appears calm and comfortable  Respiratory system: Clear to auscultation. Respiratory effort normal. Cardiovascular system: S1 & S2 heard, RRR. No JVD, murmurs,  rubs, gallops or clicks. No pedal edema. Gastrointestinal system: Abdomen is nondistended, soft and nontender. No organomegaly or masses felt. Normal bowel sounds heard. Central nervous system: Alert and awake Extremities: Symmetric 5 x 5 power. Skin: No rashes, lesions or ulcers Psychiatry: Cannot be evaluated    Data Reviewed: I have personally reviewed following labs and imaging studies  CBC: Recent Labs  Lab 07/15/18 1322 07/16/18 0557  WBC 7.6 7.8  NEUTROABS 5.2  --   HGB 9.6* 10.3*  HCT 29.1* 31.4*  MCV 95.1 94.0  PLT 176 177   Basic Metabolic Panel: Recent Labs  Lab 07/15/18 1322  07/16/18 0557  NA 135 136  K 3.7 4.3  CL 101 101  CO2 24 26  GLUCOSE 119* 96  BUN 23 19  CREATININE 1.53* 1.33*  CALCIUM 8.4* 8.7*   GFR: Estimated Creatinine Clearance: 39.8 mL/min (A) (by C-G formula based on SCr of 1.33 mg/dL (H)). Liver Function Tests: Recent Labs  Lab 07/15/18 1322  AST 23  ALT 13  ALKPHOS 72  BILITOT 0.7  PROT 6.2*  ALBUMIN 3.2*   No results for input(s): LIPASE, AMYLASE in the last 168 hours. No results for input(s): AMMONIA in the last 168 hours. Coagulation Profile: Recent Labs  Lab 07/15/18 1322  INR 1.0   Cardiac Enzymes: No results for input(s): CKTOTAL, CKMB, CKMBINDEX, TROPONINI in the last 168 hours. BNP (last 3 results) No results for input(s): PROBNP in the last 8760 hours. HbA1C: No results for input(s): HGBA1C in the last 72 hours. CBG: Recent Labs  Lab 07/15/18 1642 07/15/18 2115  GLUCAP 109* 158*   Lipid Profile: No results for input(s): CHOL, HDL, LDLCALC, TRIG, CHOLHDL, LDLDIRECT in the last 72 hours. Thyroid Function Tests: No results for input(s): TSH, T4TOTAL, FREET4, T3FREE, THYROIDAB in the last 72 hours. Anemia Panel: No results for input(s): VITAMINB12, FOLATE, FERRITIN, TIBC, IRON, RETICCTPCT in the last 72 hours. Sepsis Labs: No results for input(s): PROCALCITON, LATICACIDVEN in the last 168 hours.  Recent Results (from the past 240 hour(s))  MRSA PCR Screening     Status: None   Collection Time: 07/15/18  8:40 PM  Result Value Ref Range Status   MRSA by PCR NEGATIVE NEGATIVE Final    Comment:        The GeneXpert MRSA Assay (FDA approved for NASAL specimens only), is one component of a comprehensive MRSA colonization surveillance program. It is not intended to diagnose MRSA infection nor to guide or monitor treatment for MRSA infections. Performed at [facility], [address], [place], [place] [address]          Radiology Studies: No results found.      Scheduled Meds: .  escitalopram  5 mg Oral QPM  . insulin aspart  0-5 Units Subcutaneous QHS  . insulin aspart  0-9 Units Subcutaneous TID WC  . pantoprazole (PROTONIX) IV  40 mg Intravenous Q12H  . risperiDONE  0.25 mg Oral TID   Continuous Infusions: . lactated ringers 75 mL/hr at 07/16/18 0400     LOS: 1 day    Time spent: 30 minutes    Joseph Mickle Hoover Brunette, DO [facility] Pager [phone]  If 7PM-7AM, please contact night-coverage [email or web address] Password [facility] 07/16/2018, 7:50 AM

## 2018-07-17 ENCOUNTER — Encounter (HOSPITAL_COMMUNITY): Payer: Self-pay | Admitting: Internal Medicine

## 2018-07-17 DIAGNOSIS — K633 Ulcer of intestine: Secondary | ICD-10-CM

## 2018-07-17 DIAGNOSIS — K6389 Other specified diseases of intestine: Secondary | ICD-10-CM

## 2018-07-17 LAB — CBC
HCT: 28.2 % — ABNORMAL LOW (ref 39.0–52.0)
Hemoglobin: 9.1 g/dL — ABNORMAL LOW (ref 13.0–17.0)
MCH: 30.2 pg (ref 26.0–34.0)
MCHC: 32.3 g/dL (ref 30.0–36.0)
MCV: 93.7 fL (ref 80.0–100.0)
Platelets: 158 10*3/uL (ref 150–400)
RBC: 3.01 MIL/uL — ABNORMAL LOW (ref 4.22–5.81)
RDW: 14.5 % (ref 11.5–15.5)
WBC: 6.4 10*3/uL (ref 4.0–10.5)
nRBC: 0 % (ref 0.0–0.2)

## 2018-07-17 LAB — BASIC METABOLIC PANEL
Anion gap: 7 (ref 5–15)
BUN: 19 mg/dL (ref 8–23)
CO2: 28 mmol/L (ref 22–32)
Calcium: 8.6 mg/dL — ABNORMAL LOW (ref 8.9–10.3)
Chloride: 104 mmol/L (ref 98–111)
Creatinine, Ser: 1.42 mg/dL — ABNORMAL HIGH (ref 0.61–1.24)
GFR calc Af Amer: 53 mL/min — ABNORMAL LOW (ref >60)
GFR calc non Af Amer: 45 mL/min — ABNORMAL LOW (ref >60)
Glucose, Bld: 94 mg/dL (ref 70–99)
Potassium: 3.8 mmol/L (ref 3.5–5.1)
Sodium: 139 mmol/L (ref 135–145)

## 2018-07-17 LAB — GLUCOSE, CAPILLARY
Glucose-Capillary: 82 mg/dL (ref 70–99)
Glucose-Capillary: 107 mg/dL — ABNORMAL HIGH (ref 70–99)
Glucose-Capillary: 86 mg/dL (ref 70–99)
Glucose-Capillary: 102 mg/dL — ABNORMAL HIGH (ref 70–99)

## 2018-07-17 MED ORDER — PANTOPRAZOLE SODIUM 40 MG PO TBEC
40.0000 mg | DELAYED_RELEASE_TABLET | Freq: Every day | ORAL | Status: DC
  Administered 2018-07-18 – 2018-07-21 (×4): 40 mg via ORAL
  Filled 2018-07-17 (×4): qty 1

## 2018-07-17 MED ORDER — TAB-A-VITE/IRON PO TABS
1.0000 | ORAL_TABLET | Freq: Every day | ORAL | Status: DC
  Administered 2018-07-17 – 2018-07-21 (×5): 1 via ORAL
  Filled 2018-07-17 (×5): qty 1

## 2018-07-17 MED ORDER — HALOPERIDOL LACTATE 5 MG/ML IJ SOLN
2.0000 mg | Freq: Four times a day (QID) | INTRAMUSCULAR | Status: DC | PRN
  Administered 2018-07-18 – 2018-07-20 (×2): 2 mg via INTRAMUSCULAR
  Filled 2018-07-17 (×2): qty 1

## 2018-07-17 NOTE — Progress Notes (Signed)
PROGRESS NOTE    [name]  VWU:[id number] DOB: 04-09-1932 DOA: 07/15/2018 PCP: [name], MD   Brief Narrative:  Per HPI: [name] a 85 y.o.malewith medical history significant forCAD with prior CABG/AVR, CKD stage III, type 2 diabetes, dyslipidemia, hypertension, along with diverticulosis with external hemorrhoids, and duodenal ulcerations noted on EGD and colonoscopy on 12/2017.He was noted to have dark stools at home per the patient's daughter and the patient is unsure how long this is been going on for. He denies any abdominal pain, nausea, vomiting, diarrhea, or constipation. No fevers or chills noted. He denies any lightheadedness, dizziness, chest pain, or shortness of breath.  Patient was admitted for evaluation of acute GI bleed with melena.  Patient has undergone capsule study at this point with small amount of blood noted to originate from duodenal erosion and 1 more distal in the small bowel.  Patient appears to be in stable condition.  Assessment & Plan:   Principal Problem:   Acute GI bleeding Active Problems:   Hx of CABG and AVR   Anemia, iron deficiency   Type 2 diabetes mellitus (HCC)   CKD (chronic kidney disease), stage III (HCC)   GI bleed   1. Acute GI bleed with melena-stable. Patient noted to have prior EGD on 12/2017 with Dr. Karilyn Cota who will continue to follow with patient during this admission. Appreciate evaluation and management with planned capsule study today.  Continue on PPI twice daily.  SCDs for DVT prophylaxis and avoid any blood thinners. Follow repeat CBC and transfuse as needed.  Patient started back on diet.  Capsule study completed with minimal bleeding noted. 2. Anemia-likely acute blood loss-stable. Continue to monitor repeat CBC in a.m. and transfuse for hemoglobin less than 7. Start Flinstones chewables. 3. Type 2 diabetes. Maintain on SSI and monitor carefully. 4. CKD stage III. Appears to be stable and will  continue to monitor carefully. Monitor input and output and IV fluid. 5. History of CABG/AVR. Hold aspirin and Crestor for the time being. Hold Lasix for now.  We will plan to resume Lasix every other day on discharge. 6. Dementia. Continue on Risperdal and will also add Haldol as needed for any agitation.  Sitter at bedside.   DVT prophylaxis: SCDs Code Status: DNR Family Communication: Spoke with daughter on phone Disposition Plan:  Per GI recommendations.  We will plan for discharge by a.m. if hemoglobin stable.   Consultants:   GI  Procedures:   None  Antimicrobials:   None  Subjective: Patient seen and evaluated today with no new acute complaints or concerns. No acute concerns or events noted overnight.  Capsule study has been performed.  Sitter at bedside.  Objective: Vitals:   07/16/18 1145 07/16/18 2016 07/17/18 0500 07/17/18 0551  BP: (!) 85/59 90/61  106/66  Pulse: 65 87  91  Resp: 19 20  20   Temp:  98 F (36.7 C)  98.2 F (36.8 C)  TempSrc:  Oral  Oral  SpO2: 100% 99%  96%  Weight:   70.9 kg   Height:        Intake/Output Summary (Last 24 hours) at 07/17/2018 1101 Last data filed at 07/17/2018 0900 Gross per 24 hour  Intake 700 ml  Output 550 ml  Net 150 ml   Filed Weights   07/16/18 0500 07/16/18 0945 07/17/18 0500  Weight: 66.9 kg 66.9 kg 70.9 kg    Examination:  General exam: Appears calm and comfortable  Respiratory system:  Clear to auscultation. Respiratory effort normal. Cardiovascular system: S1 & S2 heard, RRR. No JVD, murmurs, rubs, gallops or clicks. No pedal edema. Gastrointestinal system: Abdomen is nondistended, soft and nontender. No organomegaly or masses felt. Normal bowel sounds heard. Central nervous system: Alert and oriented. No focal neurological deficits. Extremities: Symmetric 5 x 5 power. Skin: No rashes, lesions or ulcers Psychiatry: Difficult to evaluate.    Data Reviewed: I have personally reviewed  following labs and imaging studies  CBC: Recent Labs  Lab 07/15/18 1322 07/16/18 0557 07/17/18 0457  WBC 7.6 7.8 6.4  NEUTROABS 5.2  --   --   HGB 9.6* 10.3* 9.1*  HCT 29.1* 31.4* 28.2*  MCV 95.1 94.0 93.7  PLT 176 177 158   Basic Metabolic Panel: Recent Labs  Lab 07/15/18 1322 07/16/18 0557 07/17/18 0457  NA 135 136 139  K 3.7 4.3 3.8  CL 101 101 104  CO2 24 26 28   GLUCOSE 119* 96 94  BUN 23 19 19   CREATININE 1.53* 1.33* 1.42*  CALCIUM 8.4* 8.7* 8.6*   GFR: Estimated Creatinine Clearance: 39.5 mL/min (A) (by C-G formula based on SCr of 1.42 mg/dL (H)). Liver Function Tests: Recent Labs  Lab 07/15/18 1322  AST 23  ALT 13  ALKPHOS 72  BILITOT 0.7  PROT 6.2*  ALBUMIN 3.2*   No results for input(s): LIPASE, AMYLASE in the last 168 hours. No results for input(s): AMMONIA in the last 168 hours. Coagulation Profile: Recent Labs  Lab 07/15/18 1322  INR 1.0   Cardiac Enzymes: No results for input(s): CKTOTAL, CKMB, CKMBINDEX, TROPONINI in the last 168 hours. BNP (last 3 results) No results for input(s): PROBNP in the last 8760 hours. HbA1C: No results for input(s): HGBA1C in the last 72 hours. CBG: Recent Labs  Lab 07/15/18 2115 07/16/18 0802 07/16/18 1133 07/16/18 2012 07/17/18 0722  GLUCAP 158* 92 109* 134* 82   Lipid Profile: No results for input(s): CHOL, HDL, LDLCALC, TRIG, CHOLHDL, LDLDIRECT in the last 72 hours. Thyroid Function Tests: No results for input(s): TSH, T4TOTAL, FREET4, T3FREE, THYROIDAB in the last 72 hours. Anemia Panel: No results for input(s): VITAMINB12, FOLATE, FERRITIN, TIBC, IRON, RETICCTPCT in the last 72 hours. Sepsis Labs: No results for input(s): PROCALCITON, LATICACIDVEN in the last 168 hours.  Recent Results (from the past 240 hour(s))  MRSA PCR Screening     Status: None   Collection Time: 07/15/18  8:40 PM  Result Value Ref Range Status   MRSA by PCR NEGATIVE NEGATIVE Final    Comment:        The GeneXpert  MRSA Assay (FDA approved for NASAL specimens only), is one component of a comprehensive MRSA colonization surveillance program. It is not intended to diagnose MRSA infection nor to guide or monitor treatment for MRSA infections. Performed at [facility], [address] [place], [place] [address]          Radiology Studies: No results found.      Scheduled Meds: . escitalopram  5 mg Oral QPM  . insulin aspart  0-5 Units Subcutaneous QHS  . insulin aspart  0-9 Units Subcutaneous TID WC  . pantoprazole (PROTONIX) IV  40 mg Intravenous Q12H  . risperiDONE  0.25 mg Oral TID   Continuous Infusions: . lactated ringers 75 mL/hr at 07/16/18 2011     LOS: 2 days    Time spent: 30 minutes    Saki Legore Hoover Brunette, DO [facility] Pager [phone]  If 7PM-7AM, please contact night-coverage  [email or web address] Password [email or web address] 07/17/2018, 11:01 AM

## 2018-07-17 NOTE — Op Note (Signed)
Small Bowel [place] Capsule Study Procedure date: 07/16/2018  Referring Provider:  Heath Lark, DO PCP:  Dr. Laurance Flatten, Estella Husk, MD  Indication for procedure:    Patient is 85 year old Caucasian male with multiple medical problems who was evaluated for iron deficiency anemia 6 months ago and no bleeding lesion was found.  He was guaiac negative then.  He now presents with melena and anemia.  Patient underwent EGD with endoscopic placement of given capsule to study small bowel.  Findings:    Patient was not able to swallow given capsule therefore it had to be placed endoscopically. Study duration 9 hours 14 minutes and 46 seconds. First duodenal image at 00:02:58. There is small amount of blood noted which seem to originate from erosion seen on image at 01:27:26 as well as 01:27:30. Another area with erosion and streak of blood but no pooling seen at 01:28:24. Small polyp without stigmata of bleeding seen at [id number]. Submucosal lesion most consistent with lipoma best seen on image at [phone].  Overlying mucosa was normal.   First Gastric image: Not applicable as capsule was placed endoscopically First Duodenal image: 2 min and 58 sec First Ileo-Cecal Valve image: 6 hrs 35 min and 46 sec First Cecal image: 6 hrs 36 min and 15 sec Gastric Passage time: Not applicable as given capsule placed into duodenum endoscopically Small Bowel Passage time: 6 hrs 33 min and 17 sec  Summary & Recommendations:  Given capsule was placed into his second part of duodenum endoscopically. Study is complete as given capsule reached cecum.  Small bowel erosions with stigmata of bleed, best seen on images as above. Small polyp without stigmata of bleed seen on image at 02:39:09. Submucosal lesion most likely due to lipoma given its appearance best seen on image at 04:17:56.  Overlying mucosa normal. No further work-up at this time.  Consider holding aspirin for another 72 hours and then resume at lower dose if  feasible i.e. 81 mg every other day or half of a baby aspirin daily. Patient can be started on [place] chewable with iron twice daily. Patient should have H&H every 2 weeks or so for the next 2 months.

## 2018-07-17 NOTE — TOC Initial Note (Addendum)
Transition of Care [facility]) - Initial/Assessment Note    Patient Details  Name: [name] MRN: [id number] Date of Birth: June 17, 1932  [facility] of [facility] CM/SW Contact:    Ihor Gully, LCSW Phone Number: 07/17/2018, 1:10 PM  Clinical Narrative:                 Patient daughter, [name], provided history. Patient has a private caregiver from 8-5. Patient's daughter stays with him from 5:30 through the night and Ms. Gann stays with patient on the weekend. Ms. Benjamin Stain stated that patient is "hard headed" and refuses to ambulate with assistive devices. She stated that at this time they would like LTC for patient. LCSW discussed that his current insurance would only pay for short term rehab for a skilled need. LCSW provided education on skilled needs that would be short term rehab appropriate. LCSW discussed that patient would need LTC insurance or LTC Medicaid or private pay for LTC.  Patient's daughter stated that she has plans to start the LTC Medicaid process but had not yet done so. LCSW discussed that if patient had a skilled need and was authorized by [facility] it would be short term only.  Ms. Benjamin Stain indicated that she felt like patient would qualify for LTC Medicaid and would apply. LCSW encouraged Ms. Benjamin Stain to start the process today if possible.  LCSW requested attending place a PT order when patient is medically appropriate to participate. Patient is active with [facility].  Expected Discharge Plan: Skilled Nursing Facility Barriers to Discharge: No Barriers Identified   Patient Goals and CMS Choice   CMS Medicare.gov Compare Post Acute Care list provided to:: Other (Comment Required)(daughter, [name]) Choice offered to / list presented to : Adult Children  Expected Discharge Plan and Services Expected Discharge Plan: [place] arrangements for the past 2 months: Single Family Home                                      Prior  Living Arrangements/Services Living arrangements for the past 2 months: Single Family Home Lives with:: Self(Patient has around the clock care with a caregiver and two daughters ) Patient language and need for interpreter reviewed:: No Do you feel safe going back to the place where you live?: Yes      Need for Family Participation in Patient Care: Yes (Comment) Care giver support system in place?: Yes (comment)   Criminal Activity/Legal Involvement Pertinent to Current Situation/Hospitalization: No - Comment as needed  Activities of Daily Living Home Assistive Devices/Equipment: Cane (specify quad or straight), Built-in shower seat, Walker (specify type) ADL Screening (condition at time of admission) Patient's cognitive ability adequate to safely complete daily activities?: No Is the patient deaf or have difficulty hearing?: No Does the patient have difficulty seeing, even when wearing glasses/contacts?: No Does the patient have difficulty concentrating, remembering, or making decisions?: Yes Patient able to express need for assistance with ADLs?: No Does the patient have difficulty dressing or bathing?: Yes Independently performs ADLs?: No Communication: Independent Dressing (OT): Needs assistance Is this a change from baseline?: Pre-admission baseline Grooming: Needs assistance Is this a change from baseline?: Pre-admission baseline Feeding: Needs assistance Is this a change from baseline?: Pre-admission baseline Bathing: Needs assistance Is this a change from baseline?: Pre-admission baseline Toileting: Needs assistance Is this a change from baseline?: Pre-admission baseline  In/Out Bed: Needs assistance Is this a change from baseline?: Pre-admission baseline Walks in Home: Needs assistance Is this a change from baseline?: Pre-admission baseline Does the patient have difficulty walking or climbing stairs?: Yes Weakness of Legs: Both Weakness of Arms/Hands: Both  Permission  Sought/Granted Permission sought to share information with : Family Supports    Share Information with NAME: Daughter, [name].           Emotional Assessment Appearance:: Appears stated age   Affect (typically observed): Unable to Assess Orientation: : Oriented to Self Alcohol / Substance Use: Not Applicable Psych Involvement: No (comment)  Admission diagnosis:  Gastrointestinal hemorrhage, unspecified gastrointestinal hemorrhage type [K92.2] Anemia, unspecified type [D64.9] Patient Active Problem List   Diagnosis Date Noted  . Acute GI bleeding 07/15/2018  . GI bleed 07/15/2018  . Coronary artery disease involving native coronary artery of native heart without angina pectoris 07/08/2018  . CKD (chronic kidney disease), stage III ([place]) 07/08/2018  . Educated About Covid-19 Virus Infection 07/08/2018  . NSTEMI (non-ST elevated myocardial infarction) ([place]) 05/13/2018  . Sepsis due to Staphylococcus ([place]) 05/13/2018  . PICC (peripherally inserted central catheter) in place 05/12/2018  . Demand ischemia of myocardium ([place]) 04/20/2018  . Bacteremia 04/20/2018  . AMS (altered mental status) 04/18/2018  . Vomiting 04/16/2018  . Type 2 diabetes mellitus ([place]) 04/16/2018  . Cerebrovascular accident (CVA) ([place]) 02/11/2018  . Dementia without behavioral disturbance ([place]) 01/27/2018  . Falls frequently 01/27/2018  . DNR (do not resuscitate) 01/27/2018  . Diabetes mellitus due to underlying condition with diabetic autonomic neuropathy, without long-term current use of insulin ([place]) 01/21/2018  . Depression 12/26/2017  . Syncope   . Syncope and collapse   . Fall   . Microcytic anemia 12/25/2017  . Dyslipidemia 10/15/2017  . Leg swelling 10/15/2017  . Palpitations 08/21/2016  . HTN (hypertension) 08/21/2016  . Carotid bruit 08/21/2016  . H/O aortic valve replacement 08/21/2016  . Anemia, iron deficiency 04/11/2014  . Orthostatic hypotension 06/22/2012  . Dizzy 06/22/2012  .  Precordial pain 09/18/2011  . Unstable angina ([place]) 07/24/2011  . Hx of CABG and AVR 01/30/2011  . COLONIC POLYPS 06/23/2008  . Hyperlipidemia LDL goal <70 06/23/2008  . Aortic valve disorder 06/23/2008  . BENIGN PROSTATIC HYPERTROPHY, HX OF 06/23/2008   PCP:  Chipper Herb, MD Pharmacy:   [facility], [address] [place] [address] Phone: [phone] Fax: [phone]     Social Determinants of Health (SDOH) Interventions    Readmission Risk Interventions No flowsheet data found.

## 2018-07-17 NOTE — Progress Notes (Signed)
  Subjective:  Patient has no complaints.  He denies abdominal pain nausea or vomiting.  He has sitter in the room.  Patient remembers that he was not able to swallow the given capsule yesterday.  Objective: Blood pressure 106/66, pulse 91, temperature 98.2 F (36.8 C), temperature source Oral, resp. rate 20, height 6' (1.829 m), weight 70.9 kg, SpO2 96 %. Patient is alert and in no acute distress. His abdomen is flat soft and nontender.  Labs/studies Results:  CBC Latest Ref Rng & Units 07/17/2018 07/16/2018 07/15/2018  WBC 4.0 - 10.5 K/uL 6.4 7.8 7.6  Hemoglobin 13.0 - 17.0 g/dL 9.1(L) 10.3(L) 9.6(L)  Hematocrit 39.0 - 52.0 % 28.2(L) 31.4(L) 29.1(L)  Platelets 150 - 400 K/uL 158 177 176    CMP Latest Ref Rng & Units 07/17/2018 07/16/2018 07/15/2018  Glucose 70 - 99 mg/dL 94 96 119(H)  BUN 8 - 23 mg/dL '19 19 23  '$ Creatinine 0.61 - 1.24 mg/dL 1.42(H) 1.33(H) 1.53(H)  Sodium 135 - 145 mmol/L 139 136 135  Potassium 3.5 - 5.1 mmol/L 3.8 4.3 3.7  Chloride 98 - 111 mmol/L 104 101 101  CO2 22 - 32 mmol/L '28 26 24  '$ Calcium 8.9 - 10.3 mg/dL 8.6(L) 8.7(L) 8.4(L)  Total Protein 6.5 - 8.1 g/dL - - 6.2(L)  Total Bilirubin 0.3 - 1.2 mg/dL - - 0.7  Alkaline Phos 38 - 126 U/L - - 72  AST 15 - 41 U/L - - 23  ALT 0 - 44 U/L - - 13    Hepatic Function Latest Ref Rng & Units 07/15/2018 05/18/2018 04/19/2018  Total Protein 6.5 - 8.1 g/dL 6.2(L) 6.8 5.0(L)  Albumin 3.5 - 5.0 g/dL 3.2(L) 4.1 2.4(L)  AST 15 - 41 U/L 23 24 42(H)  ALT 0 - 44 U/L '13 10 19  '$ Alk Phosphatase 38 - 126 U/L 72 91 55  Total Bilirubin 0.3 - 1.2 mg/dL 0.7 0.4 0.9  Bilirubin, Direct 0.0 - 0.2 mg/dL - - -     Assessment:  #1.  GI bleed/melena secondary to small bowel erosions with stigmata of bleed.  There was only small amount of blood without pooling.  Please see separate report.  Drop in hemoglobin not unexpected.  Yesterday's hemoglobin value probably was an error.  Patient is back on oral PPI which needs to be continued given  that he has moderate hiatal hernia and history of GERD.  #2.  Anemia secondary to small bowel bleed.  Also have an element of anemia of chronic disease given diabetes and other health conditions.  #3.  Dementia.  Patient is very calm and responds appropriately.  #4. other issues include diabetes mellitus chronic kidney disease CAD and status post AVR addressed by Dr. Manuella Ghazi.  Recommendations:  Hold aspirin for another 3 days and thereafter begin at a low dose if possible such as 81 mg every other day or half of a baby aspirin daily. He should also be on iron supplements daily.  He can be started on [place] chewable with iron 2 tablets daily when he is discharged. If hemoglobin remains stable he should be able to go home in a.m.

## 2018-07-17 NOTE — Care Management Important Message (Signed)
Important Message  Patient Details  Name: [name] MRN: [id number] Date of Birth: Jan 30, 1933   Medicare Important Message Given:  Yes    Tommy Medal 07/17/2018, 2:51 PM

## 2018-07-18 LAB — BASIC METABOLIC PANEL
Anion gap: 11 (ref 5–15)
BUN: 19 mg/dL (ref 8–23)
CO2: 24 mmol/L (ref 22–32)
Calcium: 8.8 mg/dL — ABNORMAL LOW (ref 8.9–10.3)
Chloride: 104 mmol/L (ref 98–111)
Creatinine, Ser: 1.32 mg/dL — ABNORMAL HIGH (ref 0.61–1.24)
GFR calc Af Amer: 57 mL/min — ABNORMAL LOW (ref >60)
GFR calc non Af Amer: 50 mL/min — ABNORMAL LOW (ref >60)
Glucose, Bld: 85 mg/dL (ref 70–99)
Potassium: 3.5 mmol/L (ref 3.5–5.1)
Sodium: 139 mmol/L (ref 135–145)

## 2018-07-18 LAB — GLUCOSE, CAPILLARY
Glucose-Capillary: 82 mg/dL (ref 70–99)
Glucose-Capillary: 100 mg/dL — ABNORMAL HIGH (ref 70–99)
Glucose-Capillary: 83 mg/dL (ref 70–99)
Glucose-Capillary: 79 mg/dL (ref 70–99)
Glucose-Capillary: 76 mg/dL (ref 70–99)

## 2018-07-18 LAB — CBC
HCT: 31.2 % — ABNORMAL LOW (ref 39.0–52.0)
Hemoglobin: 10 g/dL — ABNORMAL LOW (ref 13.0–17.0)
MCH: 30.2 pg (ref 26.0–34.0)
MCHC: 32.1 g/dL (ref 30.0–36.0)
MCV: 94.3 fL (ref 80.0–100.0)
Platelets: 167 10*3/uL (ref 150–400)
RBC: 3.31 MIL/uL — ABNORMAL LOW (ref 4.22–5.81)
RDW: 14.1 % (ref 11.5–15.5)
WBC: 7 10*3/uL (ref 4.0–10.5)
nRBC: 0 % (ref 0.0–0.2)

## 2018-07-18 MED ORDER — DEXTROSE 50 % IV SOLN
INTRAVENOUS | Status: AC
  Filled 2018-07-18: qty 50

## 2018-07-18 NOTE — Plan of Care (Signed)
  Problem: Acute Rehab PT Goals(only PT should resolve) Goal: Pt Will Go Supine/Side To Sit Flowsheets (Taken 07/18/2018 1227) Pt will go Supine/Side to Sit: with minimal assist; with min guard assist Goal: Pt Will Go Sit To Supine/Side Flowsheets (Taken 07/18/2018 1227) Pt will go Sit to Supine/Side: with minimal assist; with min guard assist Goal: Patient Will Transfer Sit To/From Stand Flowsheets (Taken 07/18/2018 1227) Patient will transfer sit to/from stand: with minimal assist; with moderate assist Goal: Pt Will Transfer Bed To Chair/Chair To Bed Flowsheets (Taken 07/18/2018 1227) Pt will Transfer Bed to Chair/Chair to Bed: with min assist; with mod assist Goal: Pt Will Ambulate Flowsheets (Taken 07/18/2018 1227) Pt will Ambulate: 15 feet; with moderate assist; with rolling walker    Geraldine Solar PT, DPT

## 2018-07-18 NOTE — Progress Notes (Signed)
Was agitated earlier and unable to calm down.  Gave dose of haldol IM.

## 2018-07-18 NOTE — Progress Notes (Signed)
[facility] RN stated that patient was aggressive and combative. Paged [facility] to get an order for restraints incase needed. Patient has since calmed down and restraints have not been applied. Will continue to monitor.

## 2018-07-18 NOTE — Progress Notes (Signed)
PROGRESS NOTE    [name]  ZOX:[id number] DOB: 05-30-1932 DOA: 07/15/2018 PCP: Joseph Penna, MD   Brief Narrative:  Per HPI: [name] a 85 y.o.malewith medical history significant forCAD with prior CABG/AVR, CKD stage III, type 2 diabetes, dyslipidemia, hypertension, along with diverticulosis with external hemorrhoids, and duodenal ulcerations noted on EGD and colonoscopy on 12/2017.He was noted to have dark stools at home per the patient's daughter and the patient is unsure how long this is been going on for. He denies any abdominal pain, nausea, vomiting, diarrhea, or constipation. No fevers or chills noted. He denies any lightheadedness, dizziness, chest pain, or shortness of breath.  Patient was admitted for evaluation of acute GI bleed with melena.  Patient has undergone capsule study at this point with small amount of blood noted to originate from duodenal erosion and 1 more distal in the small bowel.  Patient appears to be in stable condition with stable anemia noted this morning.  PT evaluation pending and will likely require SNF on discharge.   Assessment & Plan:   Principal Problem:   Acute GI bleeding Active Problems:   Hx of CABG and AVR   Anemia, iron deficiency   Type 2 diabetes mellitus (HCC)   CKD (chronic kidney disease), stage III (HCC)   GI bleed   1. Acute GI bleed with melena-stable. Patient noted to have prior EGD on 12/2017 with Dr. Karilyn [name] who will continue to follow with patient during this admission. Appreciate evaluation and management with capsule study and no significant bleeding noted in report. Continue on PPI daily along with multivitamin with iron. SCDs for DVT prophylaxis and avoid any blood thinners. Follow repeat CBC in 48 hours and transfuse as needed.  Patient started back on diet. 2. Anemia-likely acute blood loss-stable. Continue to monitor repeat CBC in a.m. and transfuse for hemoglobin less than 7. Start Flinstones  chewables. 3. Type 2 diabetes. Maintain on SSI and monitor carefully. 4. CKD stage III. Appears to be stable and will continue to monitor carefully. Monitor input and output. 5. History of CABG/AVR. Hold aspirin and Crestor for the time being. Hold Lasix for now.  We will plan to resume Lasix every other day on discharge. 6. Dementia. Continue on Risperdal and will also add Haldol as needed for any agitation.  Sitter at bedside.  Awaiting PT evaluation with need for placement to rehab/SNF.  Discussed this with granddaughter who states that they cannot take care of patient at home.   DVT prophylaxis:SCDs Code Status:DNR Family Communication:Spoke with granddaughter on phone Disposition Plan:  Continue to monitor for any overt bleeding.  PT evaluation pending with SNF placement.   Consultants:  GI  Procedures:  None  Antimicrobials:   None  Subjective: Patient seen and evaluated today with no new acute complaints or concerns. No acute concerns or events noted overnight.  No overt bleeding noted overnight.  Objective: Vitals:   07/17/18 0500 07/17/18 0551 07/17/18 2105 07/18/18 0621  BP:  106/66 109/66 108/66  Pulse:  91 87 85  Resp:  20 17 17   Temp:  98.2 F (36.8 C) 97.8 F (36.6 C) 98.2 F (36.8 C)  TempSrc:  Oral Oral Oral  SpO2:  96% 97% 96%  Weight: 70.9 kg   69 kg  Height:        Intake/Output Summary (Last 24 hours) at 07/18/2018 1122 Last data filed at 07/18/2018 0926 Gross per 24 hour  Intake 600 ml  Output -  Net  600 ml   Filed Weights   07/16/18 0945 07/17/18 0500 07/18/18 0621  Weight: 66.9 kg 70.9 kg 69 kg    Examination:  General exam: Appears calm and comfortable mildly agitated on questioning Respiratory system: Clear to auscultation. Respiratory effort normal. Cardiovascular system: S1 & S2 heard, RRR. No JVD, murmurs, rubs, gallops or clicks. No pedal edema. Gastrointestinal system: Abdomen is nondistended, soft and  nontender. No organomegaly or masses felt. Normal bowel sounds heard. Central nervous system: Alert and awake.  Not oriented. Extremities: Symmetric 5 x 5 power. Skin: No rashes, lesions or ulcers Psychiatry: Difficult to assess.    Data Reviewed: I have personally reviewed following labs and imaging studies  CBC: Recent Labs  Lab 07/15/18 1322 07/16/18 0557 07/17/18 0457 07/18/18 0659  WBC 7.6 7.8 6.4 7.0  NEUTROABS 5.2  --   --   --   HGB 9.6* 10.3* 9.1* 10.0*  HCT 29.1* 31.4* 28.2* 31.2*  MCV 95.1 94.0 93.7 94.3  PLT 176 177 158 167   Basic Metabolic Panel: Recent Labs  Lab 07/15/18 1322 07/16/18 0557 07/17/18 0457 07/18/18 0659  NA 135 136 139 139  K 3.7 4.3 3.8 3.5  CL 101 101 104 104  CO2 24 26 28 24   GLUCOSE 119* 96 94 85  BUN 23 19 19 19   CREATININE 1.53* 1.33* 1.42* 1.32*  CALCIUM 8.4* 8.7* 8.6* 8.8*   GFR: Estimated Creatinine Clearance: 41.4 mL/min (A) (by C-G formula based on SCr of 1.32 mg/dL (H)). Liver Function Tests: Recent Labs  Lab 07/15/18 1322  AST 23  ALT 13  ALKPHOS 72  BILITOT 0.7  PROT 6.2*  ALBUMIN 3.2*   No results for input(s): LIPASE, AMYLASE in the last 168 hours. No results for input(s): AMMONIA in the last 168 hours. Coagulation Profile: Recent Labs  Lab 07/15/18 1322  INR 1.0   Cardiac Enzymes: No results for input(s): CKTOTAL, CKMB, CKMBINDEX, TROPONINI in the last 168 hours. BNP (last 3 results) No results for input(s): PROBNP in the last 8760 hours. HbA1C: No results for input(s): HGBA1C in the last 72 hours. CBG: Recent Labs  Lab 07/17/18 0722 07/17/18 1115 07/17/18 1638 07/17/18 2109 07/18/18 0739  GLUCAP 82 107* 86 102* 82   Lipid Profile: No results for input(s): CHOL, HDL, LDLCALC, TRIG, CHOLHDL, LDLDIRECT in the last 72 hours. Thyroid Function Tests: No results for input(s): TSH, T4TOTAL, FREET4, T3FREE, THYROIDAB in the last 72 hours. Anemia Panel: No results for input(s): VITAMINB12, FOLATE,  FERRITIN, TIBC, IRON, RETICCTPCT in the last 72 hours. Sepsis Labs: No results for input(s): PROCALCITON, LATICACIDVEN in the last 168 hours.  Recent Results (from the past 240 hour(s))  MRSA PCR Screening     Status: None   Collection Time: 07/15/18  8:40 PM  Result Value Ref Range Status   MRSA by PCR NEGATIVE NEGATIVE Final    Comment:        The GeneXpert MRSA Assay (FDA approved for NASAL specimens only), is one component of a comprehensive MRSA colonization surveillance program. It is not intended to diagnose MRSA infection nor to guide or monitor treatment for MRSA infections. Performed at [facility], [address] [place], [place] [address]          Radiology Studies: No results found.      Scheduled Meds: . escitalopram  5 mg Oral QPM  . insulin aspart  0-5 Units Subcutaneous QHS  . insulin aspart  0-9 Units Subcutaneous TID WC  . multivitamins  with iron  1 tablet Oral Daily  . pantoprazole  40 mg Oral Daily  . risperiDONE  0.25 mg Oral TID   Continuous Infusions:   LOS: 3 days    Time spent: 30 minutes    Joseph Fayson Hoover Brunette, DO [facility] Pager [phone]  If 7PM-7AM, please contact night-coverage [email or web address] Password [facility] 07/18/2018, 11:22 AM

## 2018-07-18 NOTE — Evaluation (Addendum)
Physical Therapy Evaluation Patient Details Name: [name] MRN: [id number] DOB: Jul 02, 1932 Today's Date: 07/18/2018   History of Present Illness  [name] is a 85 y.o. male with medical history significant for CAD with prior CABG/AVR, CKD stage III, type 2 diabetes, dyslipidemia, hypertension, along with diverticulosis with external hemorrhoids, and duodenal ulcerations noted on EGD and colonoscopy on 12/2017.  He was noted to have dark stools at home per the patient's daughter and the patient is unsure how long this is been going on for.  He denies any abdominal pain, nausea, vomiting, diarrhea, or constipation.  No fevers or chills noted.  He denies any lightheadedness, dizziness, chest pain, or shortness of breath.  Clinical Impression  Pt received in bed and was agreeable to PT evaluation. Per pt, he's from home alone with caregiver M-F 8-5 who assists with cooking, cleaning, and assists with dressing and bathing pt (even though he states that she doesn't have to do that, she just wants to help him), and he reports that he was ambulatory without devices for limited-full community distances, however, he states he's had multiple falls in the last 1.5years. Currently, pt functioning well below baseline as he required min-mod A for bed mobility and max A for STS with RW due to weakness. He had difficulty maintaining balance while sitting EOB as he continued to have R posterolateral lean and he required almost constant cues to maintain trunk upright over COG. Attempted sidestepping at EOB but due to difficulty following commands and weakness, pt unable to do so and he sat back down; he stated that his HA was keeping him from doing anymore in the session and became slightly agitated stating "I am done. D-O-N-E, done with doing this stuff." PT assisted pt back to bed and repositioned him in bed, bed alarm on, call bell and phone within reach. PT recommending SNF upon d/c due to increased weakness and  risk for falls. Pt stating he wanted to go home and get in his bed, but PT gently explained to him that he wasn't safe to return home being this weak. Pt needs continued skilled PT intervention acutely to maximize strength and balance to promote return to PLOF and reduce risk for falls.       Follow Up Recommendations SNF    Equipment Recommendations  None recommended by PT    Recommendations for Other Services       Precautions / Restrictions Precautions Precautions: Fall Precaution Comments: weak, has potential to be impulsive Restrictions Weight Bearing Restrictions: No      Mobility  Bed Mobility Overal bed mobility: Needs Assistance Bed Mobility: Supine to Sit;Sit to Supine     Supine to sit: Min assist;Mod assist Sit to supine: Min assist;Mod assist   General bed mobility comments: mostly for upper body negotiation  Transfers Overall transfer level: Needs assistance Equipment used: Rolling walker (2 wheeled) Transfers: Sit to/from Stand Sit to Stand: Max assist         General transfer comment: max A to complete 1 sit > stand due to weakness  Ambulation/Gait   Gait Distance (Feet): 0 Feet         General Gait Details: attempted sidestepping at EOB once in standing but pt unable to do so due to weakness and likely cognition causing difficulty with following commands  Stairs            Wheelchair Mobility    Modified Rankin (Stroke Patients Only)  Balance Overall balance assessment: Needs assistance Sitting-balance support: Feet supported;Bilateral upper extremity supported Sitting balance-Leahy Scale: Fair Sitting balance - Comments: mod-max cues to maintain upright trunk, continued to lean R posterolateral, pt did not notice he was doing it, cues to notice and to correct it Postural control: Posterior lean;Right lateral lean Standing balance support: Bilateral upper extremity supported Standing balance-Leahy Scale: Poor Standing  balance comment: RW                             Pertinent Vitals/Pain Pain Assessment: Faces Faces Pain Scale: Hurts a little bit Pain Location: head Pain Descriptors / Indicators: Headache Pain Intervention(s): Limited activity within patient's tolerance;Monitored during session;Repositioned;Patient requesting pain meds-RN notified    Home Living Family/patient expects to be discharged to:: Private residence Living Arrangements: Alone Available Help at Discharge: Family;Available 24 hours/day Type of Home: House Home Access: Stairs to enter Entrance Stairs-Rails: Right Entrance Stairs-Number of Steps: 5 Home Layout: Two level;Able to live on main level with bedroom/bathroom Home Equipment: Gilford Rile - 2 wheels;Cane - single point;Shower seat - built in(thinks he has shower seat in his shower)      Prior Function Level of Independence: Needs assistance   Gait / Transfers Assistance Needed: not using RW or cane to ambulate but reports 10-12 falls in last 1.5 years; states he could walk for 2 hours in community PTA  ADL's / Homemaking Assistance Needed: states his caregiver, [name], will help dress and bathe him; he states that she doesn't have to help him but she does. [name] also cooks and cleans and does his grocery shopping, etc. for pt        Hand Dominance   Dominant Hand: Right    Extremity/Trunk Assessment   Upper Extremity Assessment Upper Extremity Assessment: Generalized weakness    Lower Extremity Assessment Lower Extremity Assessment: Generalized weakness    Cervical / Trunk Assessment Cervical / Trunk Assessment: Kyphotic  Communication   Communication: No difficulties  Cognition Arousal/Alertness: Awake/alert Behavior During Therapy: WFL for tasks assessed/performed Overall Cognitive Status: History of cognitive impairments - at baseline                                 General Comments: oriented to self, not oriented to place,  situation, or date      General Comments      Exercises     Assessment/Plan    PT Assessment Patient needs continued PT services  PT Problem List Decreased strength;Decreased activity tolerance;Decreased balance;Decreased mobility;Decreased cognition;Decreased safety awareness       PT Treatment Interventions DME instruction;Gait training;Functional mobility training;Therapeutic activities;Therapeutic exercise;Balance training;Neuromuscular re-education;Patient/family education;Manual techniques    PT Goals (Current goals can be found in the Care Plan section)  Acute Rehab PT Goals Patient Stated Goal: to go home and get in my bed PT Goal Formulation: With patient Time For Goal Achievement: 07/25/18 Potential to Achieve Goals: Fair    Frequency Min 3X/week   Barriers to discharge Decreased caregiver support      Co-evaluation               AM-PAC PT "6 Clicks" Mobility  Outcome Measure Help needed turning from your back to your side while in a flat bed without using bedrails?: A Little Help needed moving from lying on your back to sitting on the side of a flat bed without using bedrails?: A  Lot Help needed moving to and from a bed to a chair (including a wheelchair)?: A Lot Help needed standing up from a chair using your arms (e.g., wheelchair or bedside chair)?: A Lot Help needed to walk in hospital room?: A Lot Help needed climbing 3-5 steps with a railing? : Total 6 Click Score: 12    End of Session Equipment Utilized During Treatment: Gait belt Activity Tolerance: Patient tolerated treatment well;Patient limited by fatigue Patient left: in bed;with call bell/phone within reach;with bed alarm set Nurse Communication: Mobility status PT Visit Diagnosis: Muscle weakness (generalized) (M62.81);Difficulty in walking, not elsewhere classified (R26.2);Unsteadiness on feet (R26.81)    Time: 2099-0689 PT Time Calculation (min) (ACUTE ONLY): 25 min   Charges:      PT Treatments $Therapeutic Activity: 8-22 mins           Geraldine Solar PT, DPT

## 2018-07-19 LAB — GLUCOSE, CAPILLARY
Glucose-Capillary: 114 mg/dL — ABNORMAL HIGH (ref 70–99)
Glucose-Capillary: 121 mg/dL — ABNORMAL HIGH (ref 70–99)
Glucose-Capillary: 145 mg/dL — ABNORMAL HIGH (ref 70–99)
Glucose-Capillary: 86 mg/dL (ref 70–99)
Glucose-Capillary: 87 mg/dL (ref 70–99)

## 2018-07-19 NOTE — Progress Notes (Signed)
Has not been in restraints today and has been calm, sleeping most of time.  Sitter at bedside

## 2018-07-19 NOTE — Progress Notes (Signed)
PROGRESS NOTE    Joseph DICKERT  [name]:[id number] DOB: [birth year] DOA: 07/15/2018 PCP: [name], MD   Brief Narrative:  Per HPI: Macguire Conquest Mabeis a 85 y.o.malewith medical history significant forCAD with prior CABG/AVR, CKD stage III, type 2 diabetes, dyslipidemia, hypertension, along with diverticulosis with external hemorrhoids, and duodenal ulcerations noted on EGD and colonoscopy on 12/2017.He was noted to have dark stools at home per the patient's daughter and the patient is unsure how long this is been going on for. He denies any abdominal pain, nausea, vomiting, diarrhea, or constipation. No fevers or chills noted. He denies any lightheadedness, dizziness, chest pain, or shortness of breath.  Patient was admitted for evaluation of acute GI bleed with melena.Patient has undergone capsule study at this point with small amount of blood noted to originate from duodenal erosion and 1 more distal in the small bowel. Patient appears to be in stable condition with stable anemia noted this morning.  PT evaluation performed on 4/25 recommending SNF/rehab on discharge.  Assessment & Plan:   Principal Problem:   Acute GI bleeding Active Problems:   Hx of CABG and AVR   Anemia, iron deficiency   Type 2 diabetes mellitus (HCC)   CKD (chronic kidney disease), stage III (HCC)   GI bleed   1. Acute GI bleed with melena-stable. Patient noted to have prior EGD on 12/2017 with Dr. Karilyn Cota who will continue to follow with patient during this admission. Appreciate evaluation and management with capsule study and no significant bleeding noted in report. Continue on PPI daily along with multivitamin with iron. SCDs for DVT prophylaxis and avoid any blood thinners. Follow repeat CBC in 24 hours.  Patient started back on diet. 2. Anemia-likely acute blood loss-stable. Continue to monitor repeat CBC in a.m.and transfuse for hemoglobin less than 7.Start Flinstones chewables with iron on  discharge. 3. Type 2 diabetes. Maintain on SSI and monitor carefully. 4. CKD stage III. Appears to be stable and will continue to monitor carefully. Monitor input and output. 5. History of CABG/AVR. Hold aspirin and Crestor for the time being. Hold Lasix for now.We will plan to resume Lasix every other day on discharge. 6. Dementia. Continue on Risperdal and will also add Haldol as needed for any agitation.Sitter at bedside.  Awaiting PT evaluation with need for placement to rehab/SNF.  Discussed this with granddaughter who states that they cannot take care of patient at home.   DVT prophylaxis:SCDs Code Status:DNR Family Communication:Spoke with granddaughter on phone 4/25 Disposition Plan: Continue to monitor for any overt bleeding.    Recheck CBC in a.m.  Awaiting SNF placement per social work.   Consultants:  GI  Procedures:  None  Antimicrobials:   None  Subjective: Patient seen and evaluated today with no new acute complaints or concerns. No acute concerns or events noted overnight.  He was noted to have some mild agitation overnight, but is resting comfortably this morning.  Objective: Vitals:   07/18/18 0621 07/18/18 1300 07/18/18 2018 07/19/18 0555  BP: 108/66 117/71 110/67 111/68  Pulse: 85  (!) 104 98  Resp: 17 18 16 18   Temp: 98.2 F (36.8 C) 98.1 F (36.7 C) 98.2 F (36.8 C) 97.6 F (36.4 C)  TempSrc: Oral Oral  Oral  SpO2: 96% 97% 100% 96%  Weight: 69 kg   69.8 kg  Height:        Intake/Output Summary (Last 24 hours) at 07/19/2018 1111 Last data filed at 07/19/2018 0900 Gross per 24  hour  Intake 600 ml  Output -  Net 600 ml   Filed Weights   07/17/18 0500 07/18/18 0621 07/19/18 0555  Weight: 70.9 kg 69 kg 69.8 kg    Examination:  General exam: Somnolent Respiratory system: Clear to auscultation. Respiratory effort normal. Cardiovascular system: S1 & S2 heard, RRR. No JVD, murmurs, rubs, gallops or clicks. No pedal edema.  Gastrointestinal system: Abdomen is nondistended, soft and nontender. No organomegaly or masses felt. Normal bowel sounds heard. Central nervous system: Somnolent Extremities: Symmetric 5 x 5 power. Skin: No rashes, lesions or ulcers Psychiatry: Cannot be assessed    Data Reviewed: I have personally reviewed following labs and imaging studies  CBC: Recent Labs  Lab 07/15/18 1322 07/16/18 0557 07/17/18 0457 07/18/18 0659  WBC 7.6 7.8 6.4 7.0  NEUTROABS 5.2  --   --   --   HGB 9.6* 10.3* 9.1* 10.0*  HCT 29.1* 31.4* 28.2* 31.2*  MCV 95.1 94.0 93.7 94.3  PLT 176 177 158 167   Basic Metabolic Panel: Recent Labs  Lab 07/15/18 1322 07/16/18 0557 07/17/18 0457 07/18/18 0659  NA 135 136 139 139  K 3.7 4.3 3.8 3.5  CL 101 101 104 104  CO2 24 26 28 24   GLUCOSE 119* 96 94 85  BUN 23 19 19 19   CREATININE 1.53* 1.33* 1.42* 1.32*  CALCIUM 8.4* 8.7* 8.6* 8.8*   GFR: Estimated Creatinine Clearance: 41.9 mL/min (A) (by C-G formula based on SCr of 1.32 mg/dL (H)). Liver Function Tests: Recent Labs  Lab 07/15/18 1322  AST 23  ALT 13  ALKPHOS 72  BILITOT 0.7  PROT 6.2*  ALBUMIN 3.2*   No results for input(s): LIPASE, AMYLASE in the last 168 hours. No results for input(s): AMMONIA in the last 168 hours. Coagulation Profile: Recent Labs  Lab 07/15/18 1322  INR 1.0   Cardiac Enzymes: No results for input(s): CKTOTAL, CKMB, CKMBINDEX, TROPONINI in the last 168 hours. BNP (last 3 results) No results for input(s): PROBNP in the last 8760 hours. HbA1C: No results for input(s): HGBA1C in the last 72 hours. CBG: Recent Labs  Lab 07/18/18 1624 07/18/18 1635 07/18/18 2137 07/18/18 2358 07/19/18 0559  GLUCAP 83 79 76 87 86   Lipid Profile: No results for input(s): CHOL, HDL, LDLCALC, TRIG, CHOLHDL, LDLDIRECT in the last 72 hours. Thyroid Function Tests: No results for input(s): TSH, T4TOTAL, FREET4, T3FREE, THYROIDAB in the last 72 hours. Anemia Panel: No results for  input(s): VITAMINB12, FOLATE, FERRITIN, TIBC, IRON, RETICCTPCT in the last 72 hours. Sepsis Labs: No results for input(s): PROCALCITON, LATICACIDVEN in the last 168 hours.  Recent Results (from the past 240 hour(s))  MRSA PCR Screening     Status: None   Collection Time: 07/15/18  8:40 PM  Result Value Ref Range Status   MRSA by PCR NEGATIVE NEGATIVE Final    Comment:        The GeneXpert MRSA Assay (FDA approved for NASAL specimens only), is one component of a comprehensive MRSA colonization surveillance program. It is not intended to diagnose MRSA infection nor to guide or monitor treatment for MRSA infections. Performed at [facility], [address], [place], [place] [address]          Radiology Studies: No results found.      Scheduled Meds: . escitalopram  5 mg Oral QPM  . insulin aspart  0-5 Units Subcutaneous QHS  . insulin aspart  0-9 Units Subcutaneous TID WC  . multivitamins with iron  1 tablet Oral Daily  . pantoprazole  40 mg Oral Daily  . risperiDONE  0.25 mg Oral TID   Continuous Infusions:   LOS: 4 days    Time spent: 30 minutes    Ianmichael Amescua Hoover Brunette, DO [facility] Pager [phone]  If 7PM-7AM, please contact night-coverage [email or web address] Password [email or web address] 07/19/2018, 11:12 AM

## 2018-07-19 NOTE — Plan of Care (Signed)
  Problem: Nutrition: Goal: Adequate nutrition will be maintained Outcome: Not Progressing   Problem: Elimination: Goal: Will not experience complications related to bowel motility Outcome: Not Progressing   Problem: Safety: Goal: Ability to remain free from injury will improve Outcome: Not Progressing   Problem: Clinical Measurements: Goal: Complications related to the disease process, condition or treatment will be avoided or minimized Outcome: Not Progressing

## 2018-07-20 LAB — CBC
HCT: 29.1 % — ABNORMAL LOW (ref 39.0–52.0)
Hemoglobin: 9.6 g/dL — ABNORMAL LOW (ref 13.0–17.0)
MCH: 30.3 pg (ref 26.0–34.0)
MCHC: 33 g/dL (ref 30.0–36.0)
MCV: 91.8 fL (ref 80.0–100.0)
Platelets: 169 10*3/uL (ref 150–400)
RBC: 3.17 MIL/uL — ABNORMAL LOW (ref 4.22–5.81)
RDW: 14 % (ref 11.5–15.5)
WBC: 7.1 10*3/uL (ref 4.0–10.5)
nRBC: 0 % (ref 0.0–0.2)

## 2018-07-20 LAB — GLUCOSE, CAPILLARY
Glucose-Capillary: 104 mg/dL — ABNORMAL HIGH (ref 70–99)
Glucose-Capillary: 105 mg/dL — ABNORMAL HIGH (ref 70–99)
Glucose-Capillary: 105 mg/dL — ABNORMAL HIGH (ref 70–99)
Glucose-Capillary: 132 mg/dL — ABNORMAL HIGH (ref 70–99)
Glucose-Capillary: 98 mg/dL (ref 70–99)

## 2018-07-20 MED ORDER — TAB-A-VITE/IRON PO TABS: 2.0000 | ORAL_TABLET | Freq: Every day | ORAL | 0 refills | Status: AC

## 2018-07-20 MED ORDER — RISPERIDONE 0.5 MG PO TABS
0.5000 mg | ORAL_TABLET | Freq: Three times a day (TID) | ORAL | Status: DC
  Administered 2018-07-20 – 2018-07-21 (×4): 0.5 mg via ORAL
  Filled 2018-07-20 (×4): qty 1

## 2018-07-20 MED ORDER — ASPIRIN EC 81 MG PO TBEC: 81.0000 mg | DELAYED_RELEASE_TABLET | ORAL | 2 refills | Status: DC

## 2018-07-20 NOTE — NC FL2 (Signed)
[place] MEDICAID FL2 LEVEL OF CARE SCREENING TOOL     IDENTIFICATION  Patient Name: [name] Birthdate: 1932-08-25 Sex: male Admission Date (Current Location): 07/15/2018  [facility] and [place] Number:  [employer] and Address:  [place] [address], [place]      Provider Number: [id number]  Attending Physician Name and Address:  Rodena Goldmann, DO  Relative Name and Phone Number:       Current Level of Care: Hospital Recommended Level of Care: [place] Prior Approval Number:    Date Approved/Denied:   PASRR Number: [id number]  Discharge Plan: Home    Current Diagnoses: Patient Active Problem List   Diagnosis Date Noted  . Acute GI bleeding 07/15/2018  . GI bleed 07/15/2018  . Coronary artery disease involving native coronary artery of native heart without angina pectoris 07/08/2018  . CKD (chronic kidney disease), stage III ([place]) 07/08/2018  . Educated About Covid-19 Virus Infection 07/08/2018  . NSTEMI (non-ST elevated myocardial infarction) ([place]) 05/13/2018  . Sepsis due to Staphylococcus ([place]) 05/13/2018  . PICC (peripherally inserted central catheter) in place 05/12/2018  . Demand ischemia of myocardium ([place]) 04/20/2018  . Bacteremia 04/20/2018  . AMS (altered mental status) 04/18/2018  . Vomiting 04/16/2018  . Type 2 diabetes mellitus ([place]) 04/16/2018  . Cerebrovascular accident (CVA) ([place]) 02/11/2018  . Dementia without behavioral disturbance ([place]) 01/27/2018  . Falls frequently 01/27/2018  . DNR (do not resuscitate) 01/27/2018  . Diabetes mellitus due to underlying condition with diabetic autonomic neuropathy, without long-term current use of insulin ([place]) 01/21/2018  . Depression 12/26/2017  . Syncope   . Syncope and collapse   . Fall   . Microcytic anemia 12/25/2017  . Dyslipidemia 10/15/2017  . Leg swelling 10/15/2017  . Palpitations 08/21/2016  . HTN (hypertension) 08/21/2016  .  Carotid bruit 08/21/2016  . H/O aortic valve replacement 08/21/2016  . Anemia, iron deficiency 04/11/2014  . Orthostatic hypotension 06/22/2012  . Dizzy 06/22/2012  . Precordial pain 09/18/2011  . Unstable angina ([place]) 07/24/2011  . Hx of CABG and AVR 01/30/2011  . COLONIC POLYPS 06/23/2008  . Hyperlipidemia LDL goal <70 06/23/2008  . Aortic valve disorder 06/23/2008  . BENIGN PROSTATIC HYPERTROPHY, HX OF 06/23/2008    Orientation RESPIRATION BLADDER Height & Weight     Self  Normal Incontinent Weight: 153 lb 14.1 oz (69.8 kg) Height:  6' (182.9 cm)  BEHAVIORAL SYMPTOMS/MOOD NEUROLOGICAL BOWEL NUTRITION STATUS      Incontinent Diet(carb modified, low sodium, heart healthy)  AMBULATORY STATUS COMMUNICATION OF NEEDS Skin   Extensive Assist Verbally Normal                       Personal Care Assistance Level of Assistance  Bathing, Feeding, Dressing Bathing Assistance: Limited assistance Feeding assistance: Independent Dressing Assistance: Limited assistance     Functional Limitations Info  Sight, Speech, Hearing Sight Info: Adequate Hearing Info: Adequate Speech Info: Adequate    SPECIAL CARE FACTORS FREQUENCY  PT (By licensed PT)     PT Frequency: 5x/week              Contractures Contractures Info: Not present    Additional Factors Info  Code Status, Allergies, Psychotropic Code Status Info: DNR Allergies Info: NKA Psychotropic Info: Lexapro, Risperdal         Current Medications (07/20/2018):  This is the current hospital active medication list Current Facility-Administered Medications  Medication Dose Route  Frequency Provider Last Rate Last Dose  . acetaminophen (TYLENOL) tablet 650 mg  650 mg Oral Q6H PRN Rogene [place], MD   650 mg at 07/19/18 2013   Or  . acetaminophen (TYLENOL) suppository 650 mg  650 mg Rectal Q6H PRN Rehman, Najeeb U, MD      . escitalopram (LEXAPRO) tablet 5 mg  5 mg Oral QPM Rehman, Najeeb U, MD   5 mg at 07/19/18 1739   . haloperidol lactate (HALDOL) injection 2 mg  2 mg Intramuscular Q6H PRN Vertis Kelch, NP   2 mg at 07/18/18 1638  . insulin aspart (novoLOG) injection 0-5 Units  0-5 Units Subcutaneous QHS Rehman, Najeeb U, MD      . insulin aspart (novoLOG) injection 0-9 Units  0-9 Units Subcutaneous TID WC Rogene [place], MD   1 Units at 07/19/18 1743  . multivitamins with iron tablet 1 tablet  1 tablet Oral Daily Manuella Ghazi, Pratik D, DO   1 tablet at 07/20/18 1033  . nitroGLYCERIN (NITROSTAT) SL tablet 0.4 mg  0.4 mg Sublingual Q5 Min x 3 PRN Rehman, Najeeb U, MD      . ondansetron (ZOFRAN) tablet 4 mg  4 mg Oral Q6H PRN Rehman, Najeeb U, MD       Or  . ondansetron (ZOFRAN) injection 4 mg  4 mg Intravenous Q6H PRN Rogene [place], MD   4 mg at 07/15/18 1616  . pantoprazole (PROTONIX) EC tablet 40 mg  40 mg Oral Daily Manuella Ghazi, Pratik D, DO   40 mg at 07/20/18 1034  . risperiDONE (RISPERDAL) tablet 0.25 mg  0.25 mg Oral TID Rogene [place], MD   0.25 mg at 07/20/18 1034     Discharge Medications: Please see discharge summary for a list of discharge medications.  Relevant Imaging Results:  Relevant Lab Results:   Additional Information SSN [id number] Clydene Pugh, LCSW

## 2018-07-20 NOTE — Discharge Summary (Signed)
Physician Discharge Summary  [name] ZOX:[id number] DOB: 1933/03/07 DOA: 07/15/2018  PCP: Joseph Herb, MD  Admit date: 07/15/2018  Discharge date: 07/20/2018  Admitted From:Home  Disposition:  SNF  Recommendations for Outpatient Follow-up:  1. Follow up with PCP in 1-2 weeks 2. Follow-up repeat CBC in 1 week 3. Continue on multivitamin and iron supplementation daily 4. Aspirin every other day as prescribed  Home Health: None  Equipment/Devices: None  Discharge Condition: Stable  CODE STATUS: Full  Diet recommendation: Heart Healthy/carb modified  Brief/Interim Summary:Per HPI: Joseph Aikey Mabeis a 85 y.o.malewith medical history significant forCAD with prior CABG/AVR, CKD stage III, type 2 diabetes, dyslipidemia, hypertension, along with diverticulosis with external hemorrhoids, and duodenal ulcerations noted on EGD and colonoscopy on 12/2017.He was noted to have dark stools at home per the patient's daughter and the patient is unsure how long this is been going on for. He denies any abdominal pain, nausea, vomiting, diarrhea, or constipation. No fevers or chills noted. He denies any lightheadedness, dizziness, chest pain, or shortness of breath.  Patient was admitted for evaluation of acute GI bleed with melena.Patient has undergone capsule study at this point with small amount of blood noted to originate from duodenal erosion and 1 more distal in the small bowel.  Please see GI documentation. Patient appears to be in stable conditionwith stable anemia noted this morning. PT evaluation performed on 4/25 recommending SNF/rehab on discharge.  Patient is noted to have some mild periods of anxiety/agitation related to his dementia for which he will resume his Risperdal home dosing at higher frequency.  He has not required any restraints.  Anemia remained stable with no further bleeding otherwise noted.  He is tolerating his diet.  He will remain on multivitamins with  iron supplementation as ordered as well as Protonix and remain on aspirin every other day as recommended by GI.  No other acute events noted during the course of this admission.   Discharge Diagnoses:  Principal Problem:   Acute GI bleeding Active Problems:   Hx of CABG and AVR   Anemia, iron deficiency   Type 2 diabetes mellitus (HCC)   CKD (chronic kidney disease), stage III (HCC)   GI bleed    Discharge Instructions  Discharge Instructions    Diet - low sodium heart healthy   Complete by:  As directed    Increase activity slowly   Complete by:  As directed      Allergies as of 07/20/2018   No Known Allergies     Medication List    STOP taking these medications   aspirin 81 MG tablet Replaced by:  aspirin EC 81 MG tablet   furosemide 20 MG tablet Commonly known as:  LASIX   metoprolol succinate 25 MG 24 hr tablet Commonly known as:  TOPROL-XL     TAKE these medications   Accu-Chek Softclix Lancets lancets Use as instructed   aspirin EC 81 MG tablet Take 1 tablet (81 mg total) by mouth every other day. Start taking on:  July 21, 2018 Replaces:  aspirin 81 MG tablet   escitalopram 5 MG tablet Commonly known as:  LEXAPRO Take 1 tablet (5 mg total) by mouth every evening.   glucose blood test strip CHECK BLOOD SUGAR UP TO 4 TIMES A DAY   metFORMIN 500 MG tablet Commonly known as:  GLUCOPHAGE Take 1 tablet (500 mg total) by mouth 2 (two) times daily.   multivitamins with iron Tabs tablet Take 2 tablets  by mouth daily for 30 days. Start taking on:  July 21, 2018   nitroGLYCERIN 0.4 MG SL tablet Commonly known as:  NITROSTAT Place 1 tablet (0.4 mg total) under the tongue every 5 (five) minutes x 3 doses as needed for chest pain.   pantoprazole 40 MG tablet Commonly known as:  PROTONIX TAKE (1) TABLET TWICE A DAY BEFOR A MEAL What changed:  See the new instructions.   polyethylene glycol 17 g packet Commonly known as:  MIRALAX / GLYCOLAX Take 17  g by mouth daily.   risperiDONE 0.25 MG tablet Commonly known as:  RISPERDAL TAKE  (1)  TABLET  THREE TIMES DAILY. What changed:  See the new instructions.   rosuvastatin 40 MG tablet Commonly known as:  CRESTOR Take 1 tablet (40 mg total) by mouth every evening.   tamsulosin 0.4 MG Caps capsule Commonly known as:  FLOMAX TAKE (1) CAPSULE DAILY What changed:  See the new instructions.      Follow-up Information    Joseph Herb, MD Follow up in 1 week(s).   Specialty:  Family Medicine Contact information: [place] [address] [phone]        Minus Breeding, MD .   Specialty:  Cardiology Contact information: [address] [place] [address] [phone]          No Known Allergies  Consultations:  GI   Procedures/Studies:  No results found.  Discharge Exam: Vitals:   07/19/18 2103 07/20/18 0549  BP: 113/71 111/69  Pulse: 84 81  Resp: 16 17  Temp: 98.2 F (36.8 C) 97.9 F (36.6 C)  SpO2: 99% 100%   Vitals:   07/19/18 0555 07/19/18 1320 07/19/18 2103 07/20/18 0549  BP: 111/68 109/68 113/71 111/69  Pulse: 98 79 84 81  Resp: 18 18 16 17   Temp: 97.6 F (36.4 C) 98.2 F (36.8 C) 98.2 F (36.8 C) 97.9 F (36.6 C)  TempSrc: Oral Axillary Oral Oral  SpO2: 96% 99% 99% 100%  Weight: 69.8 kg     Height:        General: Pt is alert, awake, not in acute distress Cardiovascular: RRR, S1/S2 +, no rubs, no gallops Respiratory: CTA bilaterally, no wheezing, no rhonchi Abdominal: Soft, NT, ND, bowel sounds + Extremities: no edema, no cyanosis    The results of significant diagnostics from this hospitalization (including imaging, microbiology, ancillary and laboratory) are listed below for reference.     Microbiology: Recent Results (from the past 240 hour(s))  MRSA PCR Screening     Status: None   Collection Time: 07/15/18  8:40 PM  Result Value Ref Range Status   MRSA by PCR NEGATIVE NEGATIVE Final     Comment:        The GeneXpert MRSA Assay (FDA approved for NASAL specimens only), is one component of a comprehensive MRSA colonization surveillance program. It is not intended to diagnose MRSA infection nor to guide or monitor treatment for MRSA infections. Performed at [facility], [address], [place], [place] [address]      Labs: BNP (last 3 results) Recent Labs    01/06/18 1812 04/02/18 1508 04/19/18 0205  BNP 128.0* 211.1* 160.1*   Basic Metabolic Panel: Recent Labs  Lab 07/15/18 1322 07/16/18 0557 07/17/18 0457 07/18/18 0659  NA 135 136 139 139  K 3.7 4.3 3.8 3.5  CL 101 101 104 104  CO2 24 26 28 24   GLUCOSE 119* 96 94 85  BUN  23 19 19 19   CREATININE 1.53* 1.33* 1.42* 1.32*  CALCIUM 8.4* 8.7* 8.6* 8.8*   Liver Function Tests: Recent Labs  Lab 07/15/18 1322  AST 23  ALT 13  ALKPHOS 72  BILITOT 0.7  PROT 6.2*  ALBUMIN 3.2*   No results for input(s): LIPASE, AMYLASE in the last 168 hours. No results for input(s): AMMONIA in the last 168 hours. CBC: Recent Labs  Lab 07/15/18 1322 07/16/18 0557 07/17/18 0457 07/18/18 0659 07/20/18 0546  WBC 7.6 7.8 6.4 7.0 7.1  NEUTROABS 5.2  --   --   --   --   HGB 9.6* 10.3* 9.1* 10.0* 9.6*  HCT 29.1* 31.4* 28.2* 31.2* 29.1*  MCV 95.1 94.0 93.7 94.3 91.8  PLT 176 177 158 167 169   Cardiac Enzymes: No results for input(s): CKTOTAL, CKMB, CKMBINDEX, TROPONINI in the last 168 hours. BNP: Invalid input(s): POCBNP CBG: Recent Labs  Lab 07/19/18 1737 07/19/18 2223 07/20/18 0019 07/20/18 0552 07/20/18 0732  GLUCAP 145* 114* 98 104* 105*   D-Dimer No results for input(s): DDIMER in the last 72 hours. Hgb A1c No results for input(s): HGBA1C in the last 72 hours. Lipid Profile No results for input(s): CHOL, HDL, LDLCALC, TRIG, CHOLHDL, LDLDIRECT in the last 72 hours. Thyroid function studies No results for input(s): TSH, T4TOTAL, T3FREE, THYROIDAB in the last 72 hours.  Invalid input(s):  FREET3 Anemia work up No results for input(s): VITAMINB12, FOLATE, FERRITIN, TIBC, IRON, RETICCTPCT in the last 72 hours. Urinalysis    Component Value Date/Time   COLORURINE YELLOW 04/18/2018 1316   APPEARANCEUR Clear 06/09/2018 0944   LABSPEC 1.017 04/18/2018 1316   PHURINE 5.0 04/18/2018 1316   GLUCOSEU Negative 06/09/2018 0944   HGBUR LARGE (A) 04/18/2018 1316   BILIRUBINUR Negative 06/09/2018 0944   KETONESUR 20 (A) 04/18/2018 1316   PROTEINUR 2+ (A) 06/09/2018 0944   PROTEINUR 30 (A) 04/18/2018 1316   UROBILINOGEN negative 08/23/2014 1030   UROBILINOGEN 0.2 08/21/2010 1150   NITRITE Negative 06/09/2018 0944   NITRITE NEGATIVE 04/18/2018 1316   LEUKOCYTESUR Negative 06/09/2018 0944   Sepsis Labs Invalid input(s): PROCALCITONIN,  WBC,  LACTICIDVEN Microbiology Recent Results (from the past 240 hour(s))  MRSA PCR Screening     Status: None   Collection Time: 07/15/18  8:40 PM  Result Value Ref Range Status   MRSA by PCR NEGATIVE NEGATIVE Final    Comment:        The GeneXpert MRSA Assay (FDA approved for NASAL specimens only), is one component of a comprehensive MRSA colonization surveillance program. It is not intended to diagnose MRSA infection nor to guide or monitor treatment for MRSA infections. Performed at [facility], [address] [place], [place] [address]      Time coordinating discharge: 35 minutes  SIGNED:   Rodena Goldmann, DO [facility] 07/20/2018, 10:55 AM  If 7PM-7AM, please contact night-coverage [email or web address] Password [email or web address]

## 2018-07-20 NOTE — Progress Notes (Signed)
Mobility Tech Note  Patient Details Name: GEROME KOKESH MRN: [id number] DOB: [birth year]  Assisted RN to scoot patient up in bed.   5:28 PM, 07/20/18 Lonell Grandchild, MPT Physical Therapist with [facility] 336 [phone] office [phone] mobile phone

## 2018-07-20 NOTE — Care Management Important Message (Signed)
Important Message  Patient Details  Name: [name] MRN: [id number] Date of Birth: [birth year]   Medicare Important Message Given:  Yes    Tommy Medal 07/20/2018, 11:35 AM

## 2018-07-20 NOTE — Progress Notes (Signed)
PT Cancellation Note  Patient Details Name: [name] MRN: [id number] DOB: 1932/05/05   Cancelled Treatment:    Reason Eval/Treat Not Completed: Patient declined, no reason specified Multiple attempts complete to participate with PT today.  Pt agitated at request and refused to participate.  Pt educated on benefits of completing session as well as included some side effects for decreased activity tolerance including weakness, bed sores and possibility of pneumonia, would not participate.  Pt left in bed supine with call bell within reach and sitter in room.  [address] LPTA; [place]  Aldona Lento 07/20/2018, 10:50 AM

## 2018-07-21 DIAGNOSIS — K5750 Diverticulosis of both small and large intestine without perforation or abscess without bleeding: Secondary | ICD-10-CM | POA: Diagnosis not present

## 2018-07-21 DIAGNOSIS — F064 Anxiety disorder due to known physiological condition: Secondary | ICD-10-CM | POA: Diagnosis not present

## 2018-07-21 DIAGNOSIS — N183 Chronic kidney disease, stage 3 (moderate): Secondary | ICD-10-CM | POA: Diagnosis not present

## 2018-07-21 DIAGNOSIS — M545 Low back pain: Secondary | ICD-10-CM | POA: Diagnosis not present

## 2018-07-21 DIAGNOSIS — S0990XA Unspecified injury of head, initial encounter: Secondary | ICD-10-CM | POA: Diagnosis not present

## 2018-07-21 DIAGNOSIS — E119 Type 2 diabetes mellitus without complications: Secondary | ICD-10-CM | POA: Diagnosis not present

## 2018-07-21 DIAGNOSIS — R196 Halitosis: Secondary | ICD-10-CM | POA: Diagnosis not present

## 2018-07-21 DIAGNOSIS — R5383 Other fatigue: Secondary | ICD-10-CM | POA: Diagnosis not present

## 2018-07-21 DIAGNOSIS — I2510 Atherosclerotic heart disease of native coronary artery without angina pectoris: Secondary | ICD-10-CM | POA: Diagnosis not present

## 2018-07-21 DIAGNOSIS — M6281 Muscle weakness (generalized): Secondary | ICD-10-CM | POA: Diagnosis not present

## 2018-07-21 DIAGNOSIS — F419 Anxiety disorder, unspecified: Secondary | ICD-10-CM | POA: Diagnosis not present

## 2018-07-21 DIAGNOSIS — I10 Essential (primary) hypertension: Secondary | ICD-10-CM | POA: Diagnosis not present

## 2018-07-21 DIAGNOSIS — F330 Major depressive disorder, recurrent, mild: Secondary | ICD-10-CM | POA: Diagnosis not present

## 2018-07-21 DIAGNOSIS — Z7401 Bed confinement status: Secondary | ICD-10-CM | POA: Diagnosis not present

## 2018-07-21 DIAGNOSIS — R55 Syncope and collapse: Secondary | ICD-10-CM | POA: Diagnosis not present

## 2018-07-21 DIAGNOSIS — M546 Pain in thoracic spine: Secondary | ICD-10-CM | POA: Diagnosis not present

## 2018-07-21 DIAGNOSIS — D649 Anemia, unspecified: Secondary | ICD-10-CM | POA: Diagnosis not present

## 2018-07-21 DIAGNOSIS — R4182 Altered mental status, unspecified: Secondary | ICD-10-CM | POA: Diagnosis not present

## 2018-07-21 DIAGNOSIS — F0151 Vascular dementia with behavioral disturbance: Secondary | ICD-10-CM | POA: Diagnosis not present

## 2018-07-21 DIAGNOSIS — R031 Nonspecific low blood-pressure reading: Secondary | ICD-10-CM | POA: Diagnosis not present

## 2018-07-21 DIAGNOSIS — K2961 Other gastritis with bleeding: Secondary | ICD-10-CM | POA: Diagnosis not present

## 2018-07-21 DIAGNOSIS — F0390 Unspecified dementia without behavioral disturbance: Secondary | ICD-10-CM | POA: Diagnosis not present

## 2018-07-21 DIAGNOSIS — E785 Hyperlipidemia, unspecified: Secondary | ICD-10-CM | POA: Diagnosis not present

## 2018-07-21 DIAGNOSIS — D500 Iron deficiency anemia secondary to blood loss (chronic): Secondary | ICD-10-CM | POA: Diagnosis not present

## 2018-07-21 DIAGNOSIS — W19XXXA Unspecified fall, initial encounter: Secondary | ICD-10-CM | POA: Diagnosis not present

## 2018-07-21 DIAGNOSIS — L98419 Non-pressure chronic ulcer of buttock with unspecified severity: Secondary | ICD-10-CM | POA: Diagnosis not present

## 2018-07-21 DIAGNOSIS — R451 Restlessness and agitation: Secondary | ICD-10-CM | POA: Diagnosis not present

## 2018-07-21 DIAGNOSIS — F0391 Unspecified dementia with behavioral disturbance: Secondary | ICD-10-CM | POA: Diagnosis not present

## 2018-07-21 DIAGNOSIS — K922 Gastrointestinal hemorrhage, unspecified: Secondary | ICD-10-CM | POA: Diagnosis not present

## 2018-07-21 LAB — GLUCOSE, CAPILLARY
Glucose-Capillary: 106 mg/dL — ABNORMAL HIGH (ref 70–99)
Glucose-Capillary: 116 mg/dL — ABNORMAL HIGH (ref 70–99)
Glucose-Capillary: 117 mg/dL — ABNORMAL HIGH (ref 70–99)
Glucose-Capillary: 126 mg/dL — ABNORMAL HIGH (ref 70–99)
Glucose-Capillary: 164 mg/dL — ABNORMAL HIGH (ref 70–99)

## 2018-07-21 NOTE — Progress Notes (Signed)
Report called to [employer] at this time

## 2018-07-21 NOTE — Progress Notes (Signed)
Second attempt to call report to [facility]  Again the phone was not answered after the receptionist transferred the call.

## 2018-07-21 NOTE — Progress Notes (Signed)
Patient seen and evaluated this a.m.  He appears to be in stable condition and has had no periods of agitation noted overnight and has remained without sitter now 24 hours.  He stable for discharge to SNF.  Please refer to discharge summary 4/27.

## 2018-07-21 NOTE — Progress Notes (Signed)
Attempted to call report to [facility] at [phone].  The phone was not answered after the receptionist transferred the call.

## 2018-07-21 NOTE — Plan of Care (Signed)

## 2018-07-21 NOTE — Progress Notes (Signed)
PT Cancellation Note  Patient Details Name: [name] MRN: [id number] DOB: 09-Mar-1933   Cancelled Treatment:    Reason Eval/Treat Not Completed: Patient declined, no reason specified Multiple attempts made to complete PT session.  Pt declined without reasoning.  Pt educated on benefits of completing therapy though continued to decline.  [address], LPTA; [name]  Aldona Lento 07/21/2018, 2:35 PM

## 2018-07-21 NOTE — TOC Transition Note (Signed)
Transition of Care [facility]) - CM/SW Discharge Note   Patient Details  Name: [name] MRN: [id number] Date of Birth: 1932-04-30  Transition of Care [facility]) CM/SW Contact:  Ihor Gully, LCSW Phone Number: 07/21/2018, 4:35 PM   Clinical Narrative:       Final next level of care: Skilled Nursing Facility Barriers to Discharge: No Barriers Identified   Patient Goals and CMS Choice   CMS Medicare.gov Compare Post Acute Care list provided to:: Other (Comment Required)(daughter, [name]) Choice offered to / list presented to : Adult Children  Discharge Placement PASRR number recieved: 07/20/18            Patient chooses bed at: [facility] Patient to be transferred to facility by: [place] Name of family member notified: [name], daughter Patient and family notified of of transfer: 07/21/18  Discharge Plan and Services                                     Social Determinants of Health (SDOH) Interventions     Readmission Risk Interventions Readmission Risk Prevention Plan 07/17/2018  Transportation Screening Complete  Palliative Care Screening Not Applicable  Some recent data might be hidden

## 2018-07-22 DIAGNOSIS — M6281 Muscle weakness (generalized): Secondary | ICD-10-CM | POA: Diagnosis not present

## 2018-07-22 DIAGNOSIS — R451 Restlessness and agitation: Secondary | ICD-10-CM | POA: Diagnosis not present

## 2018-07-22 DIAGNOSIS — R55 Syncope and collapse: Secondary | ICD-10-CM | POA: Diagnosis not present

## 2018-07-22 DIAGNOSIS — F0390 Unspecified dementia without behavioral disturbance: Secondary | ICD-10-CM | POA: Diagnosis not present

## 2018-07-22 DIAGNOSIS — F419 Anxiety disorder, unspecified: Secondary | ICD-10-CM | POA: Diagnosis not present

## 2018-07-23 DIAGNOSIS — D500 Iron deficiency anemia secondary to blood loss (chronic): Secondary | ICD-10-CM | POA: Diagnosis not present

## 2018-07-23 DIAGNOSIS — N183 Chronic kidney disease, stage 3 (moderate): Secondary | ICD-10-CM | POA: Diagnosis not present

## 2018-07-23 DIAGNOSIS — E119 Type 2 diabetes mellitus without complications: Secondary | ICD-10-CM | POA: Diagnosis not present

## 2018-07-23 DIAGNOSIS — K2961 Other gastritis with bleeding: Secondary | ICD-10-CM | POA: Diagnosis not present

## 2018-07-23 DIAGNOSIS — I10 Essential (primary) hypertension: Secondary | ICD-10-CM | POA: Diagnosis not present

## 2018-07-24 DIAGNOSIS — F0391 Unspecified dementia with behavioral disturbance: Secondary | ICD-10-CM | POA: Diagnosis not present

## 2018-07-24 DIAGNOSIS — R031 Nonspecific low blood-pressure reading: Secondary | ICD-10-CM | POA: Diagnosis not present

## 2018-07-24 DIAGNOSIS — R55 Syncope and collapse: Secondary | ICD-10-CM | POA: Diagnosis not present

## 2018-07-24 DIAGNOSIS — R451 Restlessness and agitation: Secondary | ICD-10-CM | POA: Diagnosis not present

## 2018-07-27 DIAGNOSIS — M546 Pain in thoracic spine: Secondary | ICD-10-CM | POA: Diagnosis not present

## 2018-07-27 DIAGNOSIS — F419 Anxiety disorder, unspecified: Secondary | ICD-10-CM | POA: Diagnosis not present

## 2018-07-27 DIAGNOSIS — F0391 Unspecified dementia with behavioral disturbance: Secondary | ICD-10-CM | POA: Diagnosis not present

## 2018-07-27 DIAGNOSIS — R451 Restlessness and agitation: Secondary | ICD-10-CM | POA: Diagnosis not present

## 2018-07-28 DIAGNOSIS — R5383 Other fatigue: Secondary | ICD-10-CM | POA: Diagnosis not present

## 2018-07-28 DIAGNOSIS — F0391 Unspecified dementia with behavioral disturbance: Secondary | ICD-10-CM | POA: Diagnosis not present

## 2018-07-28 DIAGNOSIS — M546 Pain in thoracic spine: Secondary | ICD-10-CM | POA: Diagnosis not present

## 2018-07-28 DIAGNOSIS — D649 Anemia, unspecified: Secondary | ICD-10-CM | POA: Diagnosis not present

## 2018-07-29 ENCOUNTER — Ambulatory Visit: Payer: Medicare HMO | Admitting: Family Medicine

## 2018-07-29 DIAGNOSIS — M545 Low back pain: Secondary | ICD-10-CM | POA: Diagnosis not present

## 2018-07-29 DIAGNOSIS — F0391 Unspecified dementia with behavioral disturbance: Secondary | ICD-10-CM | POA: Diagnosis not present

## 2018-07-29 DIAGNOSIS — R5383 Other fatigue: Secondary | ICD-10-CM | POA: Diagnosis not present

## 2018-07-30 DIAGNOSIS — K922 Gastrointestinal hemorrhage, unspecified: Secondary | ICD-10-CM | POA: Diagnosis not present

## 2018-07-30 DIAGNOSIS — M546 Pain in thoracic spine: Secondary | ICD-10-CM | POA: Diagnosis not present

## 2018-07-30 DIAGNOSIS — R5383 Other fatigue: Secondary | ICD-10-CM | POA: Diagnosis not present

## 2018-07-30 DIAGNOSIS — F0391 Unspecified dementia with behavioral disturbance: Secondary | ICD-10-CM | POA: Diagnosis not present

## 2018-07-30 DIAGNOSIS — D649 Anemia, unspecified: Secondary | ICD-10-CM | POA: Diagnosis not present

## 2018-08-05 ENCOUNTER — Other Ambulatory Visit: Payer: Self-pay | Admitting: Family Medicine

## 2018-08-05 DIAGNOSIS — F0391 Unspecified dementia with behavioral disturbance: Secondary | ICD-10-CM | POA: Diagnosis not present

## 2018-08-05 DIAGNOSIS — F32A Depression, unspecified: Secondary | ICD-10-CM

## 2018-08-05 DIAGNOSIS — W19XXXA Unspecified fall, initial encounter: Secondary | ICD-10-CM | POA: Diagnosis not present

## 2018-08-05 DIAGNOSIS — R196 Halitosis: Secondary | ICD-10-CM | POA: Diagnosis not present

## 2018-08-05 DIAGNOSIS — S0990XA Unspecified injury of head, initial encounter: Secondary | ICD-10-CM | POA: Diagnosis not present

## 2018-08-05 DIAGNOSIS — F329 Major depressive disorder, single episode, unspecified: Secondary | ICD-10-CM

## 2018-08-05 DIAGNOSIS — M6281 Muscle weakness (generalized): Secondary | ICD-10-CM | POA: Diagnosis not present

## 2018-08-13 ENCOUNTER — Telehealth: Payer: Self-pay | Admitting: Adult Health

## 2018-08-13 DIAGNOSIS — L98419 Non-pressure chronic ulcer of buttock with unspecified severity: Secondary | ICD-10-CM | POA: Diagnosis not present

## 2018-08-13 NOTE — Telephone Encounter (Signed)
Mychart pending, no smartphone (call [name] [phone] or daughter Helene Kelp [phone]),[id number], pre reg complete 08/13/18 AF

## 2018-08-16 NOTE — Progress Notes (Deleted)
{Choose 1 Note Type (Telehealth Visit or Telephone Visit):[phone]}   Date:  08/16/2018   ID:  [name], DOB [birth year], MRN [id number]  {Patient Location:[id number]::"Home"} {Provider Location:[phone]::"Home"}  PCP:  Chipper Herb, MD  Cardiologist:  Minus Breeding, MD  Electrophysiologist:  None   Evaluation Performed:  {Choose Visit Type:[phone]::"Follow-Up Visit"}  Chief Complaint:  ***  History of Present Illness:    [name] is a 85 y.o. male with known history of coronary artery disease with coronary artery bypass grafting x3 and tissue AVR in May 2012.  Other history includes chronic anemia, frailty with frequent falls, dementia, staph sepsis requiring admission and started on IV antibiotics.  He did have a TEE completed which was negative for prosthetic valve endocarditis.  He was last seen by Dr. Percival Spanish on 07/08/2018, was asymptomatic, had no falls dizziness or palpitations.  He was found to have some hypotension with blood pressures running in the 90 systolic.  He was continued on conservative medical management.  He was admitted to [facility] in the setting of GIB in April of 2020 with capsule study revealing small amount of blood originating from a duodenal erosion and one in the distal small bowel. He was placed on iron supplements, PPI and recommended not to be placed on ASA. He was discharged to SNF with PT.   The patient {does/does not:200015} have symptoms concerning for COVID-19 infection (fever, chills, cough, or new shortness of breath).    Past Medical History:  Diagnosis Date  . Anemia   . Aortic stenosis    s/p pericardial AVR  . Aortic valve disorder   . Arthritis   . Benign prostatic hypertrophy   . CAD (coronary artery disease) 01/30/2011  . Carotid bruit   . COLONIC POLYPS 06/23/2008   Qualifier: Diagnosis of  By: Mare Ferrari, RMA, Sherri    . Coronary artery disease    s/p CABG 08/2010    . Diabetes mellitus without complication ([place])   .  Dyslipidemia 10/15/2017  . Essential hypertension 08/21/2016  . H/O aortic valve replacement 08/21/2016  . Hyperlipidemia LDL goal <70 06/23/2008   Qualifier: Diagnosis of  By: Mare Ferrari, RMA, Sherri    . Orthostatic hypotension 06/22/2012  . Palpitations 08/21/2016  . Renal disorder    KIDNEY STONE  . Type 2 diabetes mellitus with hyperlipidemia ([place]) 06/23/2008   Qualifier: Diagnosis of  By: Mare Ferrari, RMA, Sherri    . Unstable angina ([place]) 07/24/2011   Past Surgical History:  Procedure Laterality Date  . AORTIC VALVE REPLACEMENT    . AORTIC VALVE REPLACEMENT  08/24/10   41mm pericardial tissue valve  . COLONOSCOPY WITH PROPOFOL    . COLONOSCOPY WITH PROPOFOL N/A 12/28/2017   Procedure: COLONOSCOPY WITH PROPOFOL;  Surgeon: Rogene Houston, MD;  [facility];  Service: Endoscopy;  Laterality: N/A;  . CORONARY ARTERY BYPASS GRAFT    . CORONARY ARTERY BYPASS GRAFT  08/24/10   LIMA to LAD, SVG to ramus intermediate, SVG to diagonal  . ESOPHAGOGASTRODUODENOSCOPY (EGD) WITH PROPOFOL    . ESOPHAGOGASTRODUODENOSCOPY (EGD) WITH PROPOFOL N/A 12/28/2017   Procedure: ESOPHAGOGASTRODUODENOSCOPY (EGD) WITH PROPOFOL;  Surgeon: Rogene Houston, MD;  [facility];  Service: Endoscopy;  Laterality: N/A;  . ESOPHAGOGASTRODUODENOSCOPY (EGD) WITH PROPOFOL N/A 07/16/2018   Procedure: ESOPHAGOGASTRODUODENOSCOPY (EGD) WITH PROPOFOL;  Surgeon: Rogene Houston, MD;  [facility];  Service: Endoscopy;  Laterality: N/A;  . GIVENS CAPSULE STUDY N/A 07/16/2018   Procedure: GIVENS CAPSULE STUDY;  Surgeon: Rogene Houston, MD;  [facility]: [facility];  Service: Endoscopy;  Laterality: N/A;  . HERNIA REPAIR    . knee replacements     x 2  . polypectomy    . POLYPECTOMY  12/28/2017   Procedure: POLYPECTOMY;  Surgeon: Rogene Houston, MD;  [facility]: [facility];  Service: Endoscopy;;  transverse colon   . REPLACEMENT TOTAL KNEE BILATERAL    . TEE WITHOUT CARDIOVERSION    . TEE WITHOUT  CARDIOVERSION N/A 04/21/2018   Procedure: TRANSESOPHAGEAL ECHOCARDIOGRAM (TEE);  Surgeon: Acie Fredrickson Wonda Cheng, MD;  Location: [facility] ENDOSCOPY;  Service: Cardiovascular;  Laterality: N/A;     No outpatient medications have been marked as taking for the 08/18/18 encounter (Appointment) with Lendon Colonel, NP.     Allergies:   Patient has no known allergies.   Social History   Tobacco Use  . Smoking status: Never Smoker  . Smokeless tobacco: Never Used  Substance Use Topics  . Alcohol use: Yes    Frequency: Never    Comment: occassional  . Drug use: No     Family Hx: The patient's family history includes Cancer in his mother; Diabetes in his father and another family member; Hypertension in an other family member.  ROS:   Please see the history of present illness.    *** All other systems reviewed and are negative.   Prior CV studies:   The following studies were reviewed today: Echocardiogram 04/20/2018 Left ventricle: The cavity size was normal. Systolic function was   vigorous. The estimated ejection fraction was in the range of 65%   to 70%. Wall motion was normal; there were no regional wall   motion abnormalities. Doppler parameters are consistent with   abnormal left ventricular relaxation (grade 1 diastolic   dysfunction). - Aortic valve: A 40mm Edwards Magna Ease bioprosthesis was present   without perivalvular leak. Transvalvular velocity was increased   above what is normal expected velocity. Peak velocity (S): 342   cm/s. Mean gradient (S): 25 mm Hg (prior 24mmHg). Peak gradient   (S): 47 mm Hg. Valve area (VTI): 0.97 cm^2. Valve area (Vmax):   1.03 cm^2. Valve area (Vmean): 0.94 cm^2. - Aorta: Ascending aortic diameter: 38 mm (S). - Ascending aorta: The ascending aorta was mildly dilated. - Mitral valve: Moderately calcified annulus. Mildly thickened   leaflets with shadowing noted on the left atrial surface.   Consider TEE for further clarification if  clinically warranted.   There was trivial regurgitation. Valve area by continuity   equation (using LVOT flow): 1.28 cm^2. - Left atrium: The atrium was severely dilated. - Tricuspid valve: There was mild regurgitation. - Pulmonary arteries: Systolic pressure was mildly increased. PA   peak pressure: 32 mm Hg (S).  Labs/Other Tests and Data Reviewed:    EKG:  {EKG/Telemetry Strips Reviewed:[phone]}  Recent Labs: 04/19/2018: B Natriuretic Peptide 553.6 04/21/2018: Magnesium 1.8 07/15/2018: ALT 13 07/18/2018: BUN 19; Creatinine, Ser 1.32; Potassium 3.5; Sodium 139 07/20/2018: Hemoglobin 9.6; Platelets 169   Recent Lipid Panel Lab Results  Component Value Date/Time   CHOL 69 04/19/2018 02:05 AM   CHOL 198 08/07/2017 11:52 AM   TRIG 70 04/19/2018 02:05 AM   TRIG 73 06/05/2015 08:54 AM   HDL 16 (L) 04/19/2018 02:05 AM   HDL 56 08/07/2017 11:52 AM   HDL 53 06/05/2015 08:54 AM   CHOLHDL 4.3 04/19/2018 02:05 AM   LDLCALC 39 04/19/2018 02:05 AM   LDLCALC 124 (H) 08/07/2017 11:52  AM   LDLCALC 69 11/25/2013 08:59 AM    Wt Readings from Last 3 Encounters:  07/19/18 153 lb 14.1 oz (69.8 kg)  07/08/18 156 lb (70.8 kg)  06/08/18 153 lb (69.4 kg)     Objective:    Vital Signs:  There were no vitals taken for this visit.   {[facility] Virtual Exam (Optional):[phone]::"VITAL SIGNS:  reviewed"}  ASSESSMENT & PLAN:    1. ***  COVID-19 Education: The signs and symptoms of COVID-19 were discussed with the patient and how to seek care for testing (follow up with PCP or arrange E-visit).  ***The importance of social distancing was discussed today.  Time:   Today, I have spent *** minutes with the patient with telehealth technology discussing the above problems.     Medication Adjustments/Labs and Tests Ordered: Current medicines are reviewed at length with the patient today.  Concerns regarding medicines are outlined above.   Tests Ordered: No orders of the defined types were  placed in this encounter.   Medication Changes: No orders of the defined types were placed in this encounter.   Disposition:  Follow up {follow up:15908}  Signed, Phill Myron. West Pugh, ANP, AACC  08/16/2018 5:52 PM    [facility]

## 2018-08-18 ENCOUNTER — Telehealth: Payer: Self-pay

## 2018-08-18 ENCOUNTER — Telehealth: Payer: Medicare HMO | Admitting: Adult Health

## 2018-08-18 NOTE — Telephone Encounter (Signed)
Called patient twice and received no answer. I was not able to leave voicemail to prechart before 3:00 visit.

## 2018-08-20 DIAGNOSIS — L98419 Non-pressure chronic ulcer of buttock with unspecified severity: Secondary | ICD-10-CM | POA: Diagnosis not present

## 2018-08-23 DIAGNOSIS — F0151 Vascular dementia with behavioral disturbance: Secondary | ICD-10-CM | POA: Diagnosis not present

## 2018-08-23 DIAGNOSIS — F330 Major depressive disorder, recurrent, mild: Secondary | ICD-10-CM | POA: Diagnosis not present

## 2018-08-25 ENCOUNTER — Other Ambulatory Visit: Payer: Self-pay | Admitting: Family Medicine

## 2018-08-28 DIAGNOSIS — M6281 Muscle weakness (generalized): Secondary | ICD-10-CM | POA: Diagnosis not present

## 2018-08-28 DIAGNOSIS — F0151 Vascular dementia with behavioral disturbance: Secondary | ICD-10-CM | POA: Diagnosis not present

## 2018-08-28 DIAGNOSIS — I10 Essential (primary) hypertension: Secondary | ICD-10-CM | POA: Diagnosis not present

## 2018-08-28 DIAGNOSIS — D649 Anemia, unspecified: Secondary | ICD-10-CM | POA: Diagnosis not present

## 2018-08-28 DIAGNOSIS — N401 Enlarged prostate with lower urinary tract symptoms: Secondary | ICD-10-CM | POA: Diagnosis not present

## 2018-08-28 DIAGNOSIS — N183 Chronic kidney disease, stage 3 (moderate): Secondary | ICD-10-CM | POA: Diagnosis not present

## 2018-08-28 DIAGNOSIS — F419 Anxiety disorder, unspecified: Secondary | ICD-10-CM | POA: Diagnosis not present

## 2018-08-28 DIAGNOSIS — K922 Gastrointestinal hemorrhage, unspecified: Secondary | ICD-10-CM | POA: Diagnosis not present

## 2018-08-28 DIAGNOSIS — F330 Major depressive disorder, recurrent, mild: Secondary | ICD-10-CM | POA: Diagnosis not present

## 2018-08-28 DIAGNOSIS — I2510 Atherosclerotic heart disease of native coronary artery without angina pectoris: Secondary | ICD-10-CM | POA: Diagnosis not present

## 2018-08-28 DIAGNOSIS — F064 Anxiety disorder due to known physiological condition: Secondary | ICD-10-CM | POA: Diagnosis not present

## 2018-08-31 DIAGNOSIS — R634 Abnormal weight loss: Secondary | ICD-10-CM | POA: Diagnosis not present

## 2018-08-31 DIAGNOSIS — E559 Vitamin D deficiency, unspecified: Secondary | ICD-10-CM | POA: Diagnosis not present

## 2018-08-31 DIAGNOSIS — R799 Abnormal finding of blood chemistry, unspecified: Secondary | ICD-10-CM | POA: Diagnosis not present

## 2018-08-31 DIAGNOSIS — R630 Anorexia: Secondary | ICD-10-CM | POA: Diagnosis not present

## 2018-08-31 DIAGNOSIS — Z79899 Other long term (current) drug therapy: Secondary | ICD-10-CM | POA: Diagnosis not present

## 2018-08-31 DIAGNOSIS — D649 Anemia, unspecified: Secondary | ICD-10-CM | POA: Diagnosis not present

## 2018-08-31 DIAGNOSIS — D519 Vitamin B12 deficiency anemia, unspecified: Secondary | ICD-10-CM | POA: Diagnosis not present

## 2018-08-31 DIAGNOSIS — R627 Adult failure to thrive: Secondary | ICD-10-CM | POA: Diagnosis not present

## 2018-08-31 DIAGNOSIS — E119 Type 2 diabetes mellitus without complications: Secondary | ICD-10-CM | POA: Diagnosis not present

## 2018-08-31 DIAGNOSIS — Z139 Encounter for screening, unspecified: Secondary | ICD-10-CM | POA: Diagnosis not present

## 2018-08-31 DIAGNOSIS — F0151 Vascular dementia with behavioral disturbance: Secondary | ICD-10-CM | POA: Diagnosis not present

## 2018-09-07 DIAGNOSIS — F0151 Vascular dementia with behavioral disturbance: Secondary | ICD-10-CM | POA: Diagnosis not present

## 2018-09-07 DIAGNOSIS — R634 Abnormal weight loss: Secondary | ICD-10-CM | POA: Diagnosis not present

## 2018-09-07 DIAGNOSIS — R627 Adult failure to thrive: Secondary | ICD-10-CM | POA: Diagnosis not present

## 2018-09-07 DIAGNOSIS — R630 Anorexia: Secondary | ICD-10-CM | POA: Diagnosis not present

## 2018-09-09 ENCOUNTER — Inpatient Hospital Stay (HOSPITAL_COMMUNITY)
Admission: EM | Admit: 2018-09-09 | Discharge: 2018-09-15 | DRG: 871 | Disposition: A | Discharge status: Hospice | Payer: Medicare HMO | Attending: Internal Medicine | Admitting: Internal Medicine

## 2018-09-09 ENCOUNTER — Inpatient Hospital Stay (HOSPITAL_COMMUNITY): Payer: Medicare HMO

## 2018-09-09 ENCOUNTER — Emergency Department (HOSPITAL_COMMUNITY): Payer: Medicare HMO

## 2018-09-09 ENCOUNTER — Other Ambulatory Visit: Payer: Self-pay

## 2018-09-09 ENCOUNTER — Encounter (HOSPITAL_COMMUNITY): Payer: Self-pay | Admitting: Emergency Medicine

## 2018-09-09 DIAGNOSIS — E875 Hyperkalemia: Secondary | ICD-10-CM | POA: Diagnosis present

## 2018-09-09 DIAGNOSIS — K760 Fatty (change of) liver, not elsewhere classified: Secondary | ICD-10-CM | POA: Diagnosis not present

## 2018-09-09 DIAGNOSIS — R0902 Hypoxemia: Secondary | ICD-10-CM | POA: Diagnosis not present

## 2018-09-09 DIAGNOSIS — E1165 Type 2 diabetes mellitus with hyperglycemia: Secondary | ICD-10-CM | POA: Diagnosis not present

## 2018-09-09 DIAGNOSIS — E860 Dehydration: Secondary | ICD-10-CM | POA: Diagnosis present

## 2018-09-09 DIAGNOSIS — R0689 Other abnormalities of breathing: Secondary | ICD-10-CM | POA: Diagnosis not present

## 2018-09-09 DIAGNOSIS — Z79899 Other long term (current) drug therapy: Secondary | ICD-10-CM

## 2018-09-09 DIAGNOSIS — Z951 Presence of aortocoronary bypass graft: Secondary | ICD-10-CM | POA: Diagnosis not present

## 2018-09-09 DIAGNOSIS — Z66 Do not resuscitate: Secondary | ICD-10-CM | POA: Diagnosis not present

## 2018-09-09 DIAGNOSIS — F0150 Vascular dementia without behavioral disturbance: Secondary | ICD-10-CM

## 2018-09-09 DIAGNOSIS — L89600 Pressure ulcer of unspecified heel, unstageable: Secondary | ICD-10-CM | POA: Diagnosis not present

## 2018-09-09 DIAGNOSIS — I5022 Chronic systolic (congestive) heart failure: Secondary | ICD-10-CM | POA: Diagnosis present

## 2018-09-09 DIAGNOSIS — L89612 Pressure ulcer of right heel, stage 2: Secondary | ICD-10-CM | POA: Diagnosis present

## 2018-09-09 DIAGNOSIS — Z7982 Long term (current) use of aspirin: Secondary | ICD-10-CM

## 2018-09-09 DIAGNOSIS — I82432 Acute embolism and thrombosis of left popliteal vein: Secondary | ICD-10-CM | POA: Diagnosis present

## 2018-09-09 DIAGNOSIS — E785 Hyperlipidemia, unspecified: Secondary | ICD-10-CM | POA: Diagnosis present

## 2018-09-09 DIAGNOSIS — K824 Cholesterolosis of gallbladder: Secondary | ICD-10-CM | POA: Diagnosis not present

## 2018-09-09 DIAGNOSIS — R4020 Unspecified coma: Secondary | ICD-10-CM | POA: Diagnosis not present

## 2018-09-09 DIAGNOSIS — E861 Hypovolemia: Secondary | ICD-10-CM | POA: Diagnosis present

## 2018-09-09 DIAGNOSIS — E1169 Type 2 diabetes mellitus with other specified complication: Secondary | ICD-10-CM | POA: Diagnosis present

## 2018-09-09 DIAGNOSIS — I130 Hypertensive heart and chronic kidney disease with heart failure and stage 1 through stage 4 chronic kidney disease, or unspecified chronic kidney disease: Secondary | ICD-10-CM | POA: Diagnosis present

## 2018-09-09 DIAGNOSIS — N179 Acute kidney failure, unspecified: Secondary | ICD-10-CM | POA: Diagnosis not present

## 2018-09-09 DIAGNOSIS — N281 Cyst of kidney, acquired: Secondary | ICD-10-CM | POA: Diagnosis not present

## 2018-09-09 DIAGNOSIS — E870 Hyperosmolality and hypernatremia: Secondary | ICD-10-CM | POA: Diagnosis present

## 2018-09-09 DIAGNOSIS — G9341 Metabolic encephalopathy: Secondary | ICD-10-CM | POA: Diagnosis present

## 2018-09-09 DIAGNOSIS — Z952 Presence of prosthetic heart valve: Secondary | ICD-10-CM

## 2018-09-09 DIAGNOSIS — Z515 Encounter for palliative care: Secondary | ICD-10-CM | POA: Diagnosis not present

## 2018-09-09 DIAGNOSIS — Z1159 Encounter for screening for other viral diseases: Secondary | ICD-10-CM | POA: Diagnosis not present

## 2018-09-09 DIAGNOSIS — R0600 Dyspnea, unspecified: Secondary | ICD-10-CM | POA: Diagnosis not present

## 2018-09-09 DIAGNOSIS — Z86010 Personal history of colonic polyps: Secondary | ICD-10-CM

## 2018-09-09 DIAGNOSIS — A411 Sepsis due to other specified staphylococcus: Principal | ICD-10-CM | POA: Diagnosis present

## 2018-09-09 DIAGNOSIS — I82412 Acute embolism and thrombosis of left femoral vein: Secondary | ICD-10-CM | POA: Diagnosis present

## 2018-09-09 DIAGNOSIS — I444 Left anterior fascicular block: Secondary | ICD-10-CM | POA: Diagnosis present

## 2018-09-09 DIAGNOSIS — R404 Transient alteration of awareness: Secondary | ICD-10-CM | POA: Diagnosis not present

## 2018-09-09 DIAGNOSIS — Z7984 Long term (current) use of oral hypoglycemic drugs: Secondary | ICD-10-CM

## 2018-09-09 DIAGNOSIS — E1122 Type 2 diabetes mellitus with diabetic chronic kidney disease: Secondary | ICD-10-CM | POA: Diagnosis present

## 2018-09-09 DIAGNOSIS — F0390 Unspecified dementia without behavioral disturbance: Secondary | ICD-10-CM | POA: Diagnosis present

## 2018-09-09 DIAGNOSIS — I82442 Acute embolism and thrombosis of left tibial vein: Secondary | ICD-10-CM | POA: Diagnosis present

## 2018-09-09 DIAGNOSIS — I2510 Atherosclerotic heart disease of native coronary artery without angina pectoris: Secondary | ICD-10-CM | POA: Diagnosis present

## 2018-09-09 DIAGNOSIS — I824Y2 Acute embolism and thrombosis of unspecified deep veins of left proximal lower extremity: Secondary | ICD-10-CM | POA: Diagnosis present

## 2018-09-09 DIAGNOSIS — Z681 Body mass index (BMI) 19 or less, adult: Secondary | ICD-10-CM

## 2018-09-09 DIAGNOSIS — L8990 Pressure ulcer of unspecified site, unspecified stage: Secondary | ICD-10-CM | POA: Insufficient documentation

## 2018-09-09 DIAGNOSIS — R6520 Severe sepsis without septic shock: Secondary | ICD-10-CM

## 2018-09-09 DIAGNOSIS — R6521 Severe sepsis with septic shock: Secondary | ICD-10-CM | POA: Diagnosis not present

## 2018-09-09 DIAGNOSIS — F329 Major depressive disorder, single episode, unspecified: Secondary | ICD-10-CM | POA: Diagnosis present

## 2018-09-09 DIAGNOSIS — L89126 Pressure-induced deep tissue damage of left upper back: Secondary | ICD-10-CM | POA: Diagnosis present

## 2018-09-09 DIAGNOSIS — Z87442 Personal history of urinary calculi: Secondary | ICD-10-CM

## 2018-09-09 DIAGNOSIS — E43 Unspecified severe protein-calorie malnutrition: Secondary | ICD-10-CM | POA: Diagnosis present

## 2018-09-09 DIAGNOSIS — R000 Tachycardia, unspecified: Secondary | ICD-10-CM | POA: Diagnosis not present

## 2018-09-09 DIAGNOSIS — T380X5A Adverse effect of glucocorticoids and synthetic analogues, initial encounter: Secondary | ICD-10-CM | POA: Diagnosis not present

## 2018-09-09 DIAGNOSIS — Z833 Family history of diabetes mellitus: Secondary | ICD-10-CM

## 2018-09-09 DIAGNOSIS — A419 Sepsis, unspecified organism: Secondary | ICD-10-CM

## 2018-09-09 DIAGNOSIS — N183 Chronic kidney disease, stage 3 unspecified: Secondary | ICD-10-CM | POA: Diagnosis present

## 2018-09-09 DIAGNOSIS — N400 Enlarged prostate without lower urinary tract symptoms: Secondary | ICD-10-CM | POA: Diagnosis present

## 2018-09-09 DIAGNOSIS — R609 Edema, unspecified: Secondary | ICD-10-CM | POA: Diagnosis not present

## 2018-09-09 DIAGNOSIS — M1990 Unspecified osteoarthritis, unspecified site: Secondary | ICD-10-CM | POA: Diagnosis present

## 2018-09-09 DIAGNOSIS — L89221 Pressure ulcer of left hip, stage 1: Secondary | ICD-10-CM | POA: Diagnosis present

## 2018-09-09 DIAGNOSIS — Z8249 Family history of ischemic heart disease and other diseases of the circulatory system: Secondary | ICD-10-CM

## 2018-09-09 DIAGNOSIS — Z7189 Other specified counseling: Secondary | ICD-10-CM | POA: Diagnosis not present

## 2018-09-09 DIAGNOSIS — R4182 Altered mental status, unspecified: Secondary | ICD-10-CM | POA: Diagnosis present

## 2018-09-09 DIAGNOSIS — L89896 Pressure-induced deep tissue damage of other site: Secondary | ICD-10-CM | POA: Diagnosis present

## 2018-09-09 DIAGNOSIS — R579 Shock, unspecified: Secondary | ICD-10-CM | POA: Diagnosis not present

## 2018-09-09 DIAGNOSIS — E876 Hypokalemia: Secondary | ICD-10-CM | POA: Diagnosis not present

## 2018-09-09 LAB — SALICYLATE LEVEL: Salicylate Lvl: <7 mg/dL (ref 2.8–30.0)

## 2018-09-09 LAB — COMPREHENSIVE METABOLIC PANEL
ALT: 18 U/L (ref 0–44)
ALT: 15 U/L (ref 0–44)
AST: 26 U/L (ref 15–41)
AST: 26 U/L (ref 15–41)
Albumin: 2.8 g/dL — ABNORMAL LOW (ref 3.5–5.0)
Albumin: 2.4 g/dL — ABNORMAL LOW (ref 3.5–5.0)
Alkaline Phosphatase: 148 U/L — ABNORMAL HIGH (ref 38–126)
Alkaline Phosphatase: 125 U/L (ref 38–126)
Anion gap: 18 — ABNORMAL HIGH (ref 5–15)
Anion gap: 20 — ABNORMAL HIGH (ref 5–15)
BUN: 111 mg/dL — ABNORMAL HIGH (ref 8–23)
BUN: 131 mg/dL — ABNORMAL HIGH (ref 8–23)
CO2: 17 mmol/L — ABNORMAL LOW (ref 22–32)
CO2: 19 mmol/L — ABNORMAL LOW (ref 22–32)
Calcium: 8.9 mg/dL (ref 8.9–10.3)
Calcium: 7.6 mg/dL — ABNORMAL LOW (ref 8.9–10.3)
Chloride: 112 mmol/L — ABNORMAL HIGH (ref 98–111)
Chloride: 107 mmol/L (ref 98–111)
Creatinine, Ser: 4.99 mg/dL — ABNORMAL HIGH (ref 0.61–1.24)
Creatinine, Ser: 4.76 mg/dL — ABNORMAL HIGH (ref 0.61–1.24)
GFR calc Af Amer: 12 mL/min — ABNORMAL LOW (ref >60)
GFR calc Af Amer: 12 mL/min — ABNORMAL LOW (ref >60)
GFR calc non Af Amer: 10 mL/min — ABNORMAL LOW (ref >60)
GFR calc non Af Amer: 11 mL/min — ABNORMAL LOW (ref >60)
Glucose, Bld: 176 mg/dL — ABNORMAL HIGH (ref 70–99)
Glucose, Bld: 171 mg/dL — ABNORMAL HIGH (ref 70–99)
Potassium: 5.3 mmol/L — ABNORMAL HIGH (ref 3.5–5.1)
Potassium: 5 mmol/L (ref 3.5–5.1)
Sodium: 147 mmol/L — ABNORMAL HIGH (ref 135–145)
Sodium: 146 mmol/L — ABNORMAL HIGH (ref 135–145)
Total Bilirubin: 2.2 mg/dL — ABNORMAL HIGH (ref 0.3–1.2)
Total Bilirubin: 1.7 mg/dL — ABNORMAL HIGH (ref 0.3–1.2)
Total Protein: 7.1 g/dL (ref 6.5–8.1)
Total Protein: 6.5 g/dL (ref 6.5–8.1)

## 2018-09-09 LAB — URINALYSIS, ROUTINE W REFLEX MICROSCOPIC
Bilirubin Urine: NEGATIVE
Glucose, UA: NEGATIVE mg/dL
Hgb urine dipstick: NEGATIVE
Ketones, ur: NEGATIVE mg/dL
Leukocytes,Ua: NEGATIVE
Nitrite: NEGATIVE
Protein, ur: NEGATIVE mg/dL
Specific Gravity, Urine: 1.018 (ref 1.005–1.030)
pH: 5 (ref 5.0–8.0)

## 2018-09-09 LAB — CBC WITH DIFFERENTIAL/PLATELET
Abs Immature Granulocytes: 0.06 10*3/uL (ref 0.00–0.07)
Abs Immature Granulocytes: 0.11 10*3/uL — ABNORMAL HIGH (ref 0.00–0.07)
Basophils Absolute: 0 10*3/uL (ref 0.0–0.1)
Basophils Absolute: 0 10*3/uL (ref 0.0–0.1)
Basophils Relative: 0 %
Basophils Relative: 0 %
Eosinophils Absolute: 0 10*3/uL (ref 0.0–0.5)
Eosinophils Absolute: 0 10*3/uL (ref 0.0–0.5)
Eosinophils Relative: 0 %
Eosinophils Relative: 0 %
HCT: 35.9 % — ABNORMAL LOW (ref 39.0–52.0)
HCT: 35 % — ABNORMAL LOW (ref 39.0–52.0)
Hemoglobin: 11.3 g/dL — ABNORMAL LOW (ref 13.0–17.0)
Hemoglobin: 10.9 g/dL — ABNORMAL LOW (ref 13.0–17.0)
Immature Granulocytes: 1 %
Immature Granulocytes: 1 %
Lymphocytes Relative: 20 %
Lymphocytes Relative: 20 %
Lymphs Abs: 2.7 10*3/uL (ref 0.7–4.0)
Lymphs Abs: 2.6 10*3/uL (ref 0.7–4.0)
MCH: 30.3 pg (ref 26.0–34.0)
MCH: 30.1 pg (ref 26.0–34.0)
MCHC: 31.5 g/dL (ref 30.0–36.0)
MCHC: 31.1 g/dL (ref 30.0–36.0)
MCV: 96.2 fL (ref 80.0–100.0)
MCV: 96.7 fL (ref 80.0–100.0)
Monocytes Absolute: 0.8 10*3/uL (ref 0.1–1.0)
Monocytes Absolute: 0.9 10*3/uL (ref 0.1–1.0)
Monocytes Relative: 6 %
Monocytes Relative: 7 %
Neutro Abs: 9.7 10*3/uL — ABNORMAL HIGH (ref 1.7–7.7)
Neutro Abs: 9.4 10*3/uL — ABNORMAL HIGH (ref 1.7–7.7)
Neutrophils Relative %: 73 %
Neutrophils Relative %: 72 %
Platelets: 219 10*3/uL (ref 150–400)
Platelets: 164 10*3/uL (ref 150–400)
RBC: 3.73 MIL/uL — ABNORMAL LOW (ref 4.22–5.81)
RBC: 3.62 MIL/uL — ABNORMAL LOW (ref 4.22–5.81)
RDW: 15.6 % — ABNORMAL HIGH (ref 11.5–15.5)
RDW: 15.7 % — ABNORMAL HIGH (ref 11.5–15.5)
WBC: 13.2 10*3/uL — ABNORMAL HIGH (ref 4.0–10.5)
WBC: 13 10*3/uL — ABNORMAL HIGH (ref 4.0–10.5)
nRBC: 0 % (ref 0.0–0.2)
nRBC: 0 % (ref 0.0–0.2)

## 2018-09-09 LAB — BASIC METABOLIC PANEL
Anion gap: 17 — ABNORMAL HIGH (ref 5–15)
BUN: 133 mg/dL — ABNORMAL HIGH (ref 8–23)
CO2: 17 mmol/L — ABNORMAL LOW (ref 22–32)
Calcium: 8.2 mg/dL — ABNORMAL LOW (ref 8.9–10.3)
Chloride: 114 mmol/L — ABNORMAL HIGH (ref 98–111)
Creatinine, Ser: 4.73 mg/dL — ABNORMAL HIGH (ref 0.61–1.24)
GFR calc Af Amer: 12 mL/min — ABNORMAL LOW (ref >60)
GFR calc non Af Amer: 11 mL/min — ABNORMAL LOW (ref >60)
Glucose, Bld: 209 mg/dL — ABNORMAL HIGH (ref 70–99)
Potassium: 4.2 mmol/L (ref 3.5–5.1)
Sodium: 148 mmol/L — ABNORMAL HIGH (ref 135–145)

## 2018-09-09 LAB — SARS CORONAVIRUS 2 BY RT PCR (HOSPITAL ORDER, PERFORMED IN ~~LOC~~ HOSPITAL LAB): SARS Coronavirus 2: NEGATIVE

## 2018-09-09 LAB — BILIRUBIN, FRACTIONATED(TOT/DIR/INDIR)
Bilirubin, Direct: 0.9 mg/dL — ABNORMAL HIGH (ref 0.0–0.2)
Indirect Bilirubin: 1.1 mg/dL — ABNORMAL HIGH (ref 0.3–0.9)
Total Bilirubin: 2 mg/dL — ABNORMAL HIGH (ref 0.3–1.2)

## 2018-09-09 LAB — LACTIC ACID, PLASMA
Lactic Acid, Venous: 2.6 mmol/L (ref 0.5–1.9)
Lactic Acid, Venous: 1.6 mmol/L (ref 0.5–1.9)
Lactic Acid, Venous: 2.7 mmol/L (ref 0.5–1.9)

## 2018-09-09 LAB — PROTEIN / CREATININE RATIO, URINE
Creatinine, Urine: 78.46 mg/dL
Protein Creatinine Ratio: 1.16 mg/mg{Cre} — ABNORMAL HIGH (ref 0.00–0.15)
Total Protein, Urine: 91 mg/dL

## 2018-09-09 LAB — OSMOLALITY: Osmolality: 365 mOsm/kg (ref 275–295)

## 2018-09-09 LAB — CREATININE, URINE, RANDOM: Creatinine, Urine: 79.76 mg/dL

## 2018-09-09 LAB — GLUCOSE, CAPILLARY: Glucose-Capillary: 153 mg/dL — ABNORMAL HIGH (ref 70–99)

## 2018-09-09 LAB — MRSA PCR SCREENING: MRSA by PCR: NEGATIVE

## 2018-09-09 LAB — CBG MONITORING, ED: Glucose-Capillary: 163 mg/dL — ABNORMAL HIGH (ref 70–99)

## 2018-09-09 LAB — OSMOLALITY, URINE: Osmolality, Ur: 418 mOsm/kg (ref 300–900)

## 2018-09-09 LAB — NA AND K (SODIUM & POTASSIUM), RAND UR
Potassium Urine: 49 mmol/L
Sodium, Ur: 25 mmol/L

## 2018-09-09 LAB — SODIUM, URINE, RANDOM: Sodium, Ur: 25 mmol/L

## 2018-09-09 LAB — VALPROIC ACID LEVEL: Valproic Acid Lvl: <10 ug/mL — ABNORMAL LOW (ref 50.0–100.0)

## 2018-09-09 MED ORDER — SODIUM CHLORIDE 0.9 % IV SOLN
500.0000 mg | Freq: Once | INTRAVENOUS | Status: AC
  Administered 2018-09-09: 03:00:00 500 mg via INTRAVENOUS
  Filled 2018-09-09: qty 5

## 2018-09-09 MED ORDER — VANCOMYCIN HCL 10 G IV SOLR
1500.0000 mg | Freq: Once | INTRAVENOUS | Status: AC
  Administered 2018-09-09: 03:00:00 1500 mg via INTRAVENOUS
  Filled 2018-09-09: qty 1500

## 2018-09-09 MED ORDER — SODIUM CHLORIDE 0.9 % IV SOLN
2.0000 g | Freq: Once | INTRAVENOUS | Status: AC
  Administered 2018-09-09: 2 g via INTRAVENOUS
  Filled 2018-09-09: qty 2

## 2018-09-09 MED ORDER — SODIUM BICARBONATE 8.4 % IV SOLN
50.0000 meq | Freq: Once | INTRAVENOUS | Status: AC
  Administered 2018-09-09: 03:00:00 50 meq via INTRAVENOUS
  Filled 2018-09-09: qty 50

## 2018-09-09 MED ORDER — ROSUVASTATIN CALCIUM 20 MG PO TABS: 40.0000 mg | ORAL_TABLET | Freq: Every evening | ORAL | Status: DC

## 2018-09-09 MED ORDER — ACETAMINOPHEN 650 MG RE SUPP: 650.0000 mg | Freq: Four times a day (QID) | RECTAL | Status: DC | PRN

## 2018-09-09 MED ORDER — TAMSULOSIN HCL 0.4 MG PO CAPS: 0.4000 mg | ORAL_CAPSULE | Freq: Every day | ORAL | Status: DC

## 2018-09-09 MED ORDER — DEXTROSE-NACL 5-0.45 % IV SOLN
INTRAVENOUS | Status: DC
  Administered 2018-09-09: 10:00:00 via INTRAVENOUS

## 2018-09-09 MED ORDER — AEROCHAMBER Z-STAT PLUS/MEDIUM MISC
Status: AC
  Filled 2018-09-09: qty 1

## 2018-09-09 MED ORDER — AEROCHAMBER PLUS FLO-VU MISC
1.0000 | Freq: Once | Status: AC
  Administered 2018-09-09: 1

## 2018-09-09 MED ORDER — ESCITALOPRAM OXALATE 5 MG PO TABS
5.0000 mg | ORAL_TABLET | Freq: Every day | ORAL | Status: DC
  Filled 2018-09-09: qty 1

## 2018-09-09 MED ORDER — HEPARIN SODIUM (PORCINE) 5000 UNIT/ML IJ SOLN
5000.0000 [IU] | Freq: Three times a day (TID) | INTRAMUSCULAR | Status: DC
  Administered 2018-09-09 – 2018-09-11 (×7): 5000 [IU] via SUBCUTANEOUS
  Filled 2018-09-09 (×7): qty 1

## 2018-09-09 MED ORDER — MIRTAZAPINE 15 MG PO TABS: 7.5000 mg | ORAL_TABLET | Freq: Every day | ORAL | Status: DC

## 2018-09-09 MED ORDER — ALBUTEROL SULFATE HFA 108 (90 BASE) MCG/ACT IN AERS
8.0000 | INHALATION_SPRAY | Freq: Once | RESPIRATORY_TRACT | Status: AC
  Administered 2018-09-09: 04:00:00 8 via RESPIRATORY_TRACT
  Filled 2018-09-09: qty 6.7

## 2018-09-09 MED ORDER — ONDANSETRON HCL 4 MG PO TABS: 4.0000 mg | ORAL_TABLET | Freq: Four times a day (QID) | ORAL | Status: DC | PRN

## 2018-09-09 MED ORDER — SODIUM CHLORIDE 0.9% FLUSH
3.0000 mL | Freq: Two times a day (BID) | INTRAVENOUS | Status: DC
  Administered 2018-09-09 – 2018-09-14 (×8): 3 mL via INTRAVENOUS

## 2018-09-09 MED ORDER — GLUCERNA SHAKE PO LIQD
237.0000 mL | Freq: Three times a day (TID) | ORAL | Status: DC
  Filled 2018-09-09 (×5): qty 237

## 2018-09-09 MED ORDER — ONDANSETRON HCL 4 MG/2ML IJ SOLN: 4.0000 mg | Freq: Four times a day (QID) | INTRAMUSCULAR | Status: DC | PRN

## 2018-09-09 MED ORDER — CHLORHEXIDINE GLUCONATE CLOTH 2 % EX PADS
6.0000 | MEDICATED_PAD | Freq: Every day | CUTANEOUS | Status: DC
  Administered 2018-09-09 – 2018-09-10 (×2): 6 via TOPICAL

## 2018-09-09 MED ORDER — SODIUM CHLORIDE 0.9 % IV BOLUS
500.0000 mL | Freq: Once | INTRAVENOUS | Status: AC
  Administered 2018-09-09: 01:00:00 500 mL via INTRAVENOUS

## 2018-09-09 MED ORDER — ASPIRIN EC 81 MG PO TBEC: 81.0000 mg | DELAYED_RELEASE_TABLET | ORAL | Status: DC

## 2018-09-09 MED ORDER — ORAL CARE MOUTH RINSE
15.0000 mL | Freq: Two times a day (BID) | OROMUCOSAL | Status: DC
  Administered 2018-09-09 – 2018-09-15 (×13): 15 mL via OROMUCOSAL

## 2018-09-09 MED ORDER — SODIUM CHLORIDE 0.9 % IV SOLN
1.0000 g | INTRAVENOUS | Status: DC
  Administered 2018-09-10: 1 g via INTRAVENOUS
  Filled 2018-09-09: qty 1

## 2018-09-09 MED ORDER — SODIUM CHLORIDE 0.9 % IV SOLN: INTRAVENOUS | Status: DC

## 2018-09-09 MED ORDER — SODIUM CHLORIDE 0.45 % IV SOLN
INTRAVENOUS | Status: DC
  Administered 2018-09-09: 06:00:00 via INTRAVENOUS

## 2018-09-09 MED ORDER — PANTOPRAZOLE SODIUM 40 MG PO TBEC: 40.0000 mg | DELAYED_RELEASE_TABLET | Freq: Two times a day (BID) | ORAL | Status: DC

## 2018-09-09 MED ORDER — METRONIDAZOLE IN NACL 5-0.79 MG/ML-% IV SOLN: 500.0000 mg | Freq: Three times a day (TID) | INTRAVENOUS | Status: DC

## 2018-09-09 MED ORDER — ACETAMINOPHEN 325 MG PO TABS: 650.0000 mg | ORAL_TABLET | Freq: Four times a day (QID) | ORAL | Status: DC | PRN

## 2018-09-09 MED ORDER — VANCOMYCIN VARIABLE DOSE PER UNSTABLE RENAL FUNCTION (PHARMACIST DOSING): Status: DC

## 2018-09-09 MED ORDER — DIVALPROEX SODIUM 125 MG PO DR TAB
125.0000 mg | DELAYED_RELEASE_TABLET | Freq: Two times a day (BID) | ORAL | Status: DC
  Filled 2018-09-09: qty 1

## 2018-09-09 MED ORDER — VANCOMYCIN HCL IN DEXTROSE 1-5 GM/200ML-% IV SOLN: 1000.0000 mg | Freq: Once | INTRAVENOUS | Status: DC

## 2018-09-09 MED ORDER — SODIUM ZIRCONIUM CYCLOSILICATE 5 G PO PACK
5.0000 g | PACK | ORAL | Status: DC
  Filled 2018-09-09: qty 1

## 2018-09-09 MED ORDER — SODIUM CHLORIDE 0.9 % IV BOLUS
30.0000 mL/kg | Freq: Once | INTRAVENOUS | Status: AC
  Administered 2018-09-09: 1362 mL via INTRAVENOUS

## 2018-09-09 MED ORDER — SODIUM CHLORIDE 0.9 % IV BOLUS
500.0000 mL | Freq: Once | INTRAVENOUS | Status: AC
  Administered 2018-09-09: 10:00:00 500 mL via INTRAVENOUS

## 2018-09-09 MED ORDER — METRONIDAZOLE IN NACL 5-0.79 MG/ML-% IV SOLN
500.0000 mg | Freq: Once | INTRAVENOUS | Status: AC
  Administered 2018-09-09: 500 mg via INTRAVENOUS
  Filled 2018-09-09: qty 100

## 2018-09-09 MED ORDER — POLYETHYLENE GLYCOL 3350 17 G PO PACK: 17.0000 g | PACK | Freq: Every day | ORAL | Status: DC | PRN

## 2018-09-09 NOTE — Progress Notes (Signed)
CRITICAL VALUE ALERT  Critical Value:  Lactic 2.7  Date & Time Notied:  09/09/18 0715   Provider Notified: Cathlean Sauer, MD  Orders Received/Actions taken: 500 cc NS bolus given/IVF changed

## 2018-09-09 NOTE — ED Notes (Signed)
ED TO INPATIENT HANDOFF REPORT  Name/Age/Gender [name] 85 y.o. male  Code Status Code Status History    Date Active Date Inactive Code Status Order ID Comments User Context   07/15/2018 1507 07/21/2018 2111 DNR [id number]  Heath Lark D, DO ED   07/15/2018 1505 07/15/2018 1507 Full Code [id number]  Heath Lark D, DO ED   04/18/2018 1914 04/22/2018 2013 DNR [id number]  Bethena Roys, MD ED   04/18/2018 1914 04/18/2018 1914 DNR [id number]  Bethena Roys, MD ED   04/18/2018 1522 04/18/2018 1914 DNR [id number]  Orlie Dakin, MD ED   04/18/2018 1244 04/18/2018 1522 DNR [id number]  Orlie Dakin, MD ED   04/18/2018 1241 04/18/2018 1244 DNR [id number]  Orlie Dakin, MD ED   04/16/2018 2259 04/18/2018 0210 DNR [id number]  Shela Leff, MD ED   04/06/2018 2333 04/07/2018 1620 Full Code [id number]  Margette Fast, MD ED   01/27/2018 1811 01/27/2018 2127 DNR [id number]  Roney Jaffe, MD Inpatient   01/26/2018 2007 01/27/2018 1810 Full Code [id number]  Bethena Roys, MD Inpatient   12/25/2017 2133 12/28/2017 1533 Full Code [id number]  Shela Leff, MD Inpatient   Advance Care Planning Activity    Questions for Most Recent Historical Code Status (Order [id number])    Question Answer Comment   In the event of cardiac or respiratory ARREST Do not call a "code blue"    In the event of cardiac or respiratory ARREST Do not perform Intubation, CPR, defibrillation or ACLS    In the event of cardiac or respiratory ARREST Use medication by any route, position, wound care, and other measures to relive pain and suffering. May use oxygen, suction and manual treatment of airway obstruction as needed for comfort.       Home/SNF/Other Skilled nursing facility  Chief Complaint Code Sepsis  Level of Care/Admitting Diagnosis ED Disposition    ED Disposition Condition [facility] Area: [place] [[id number]]  Level of Care: Stepdown [14]  Admit to SDU based  on following criteria: Hemodynamic compromise or significant risk of instability:  Patient requiring short term acute titration and management of vasoactive drips, and invasive monitoring (i.e., CVP and Arterial line).  Covid Evaluation: Confirmed COVID Negative  Diagnosis: Severe sepsis [facility]) [[id number]]  Admitting Physician: Vianne Bulls [[id number]]  Attending Physician: Vianne Bulls [[id number]]  Estimated length of stay: past midnight tomorrow  Certification:: I certify this patient will need inpatient services for at least 2 midnights  PT Class (Do Not Modify): Inpatient [101]  PT Acc Code (Do Not Modify): Private [1]       Medical History Past Medical History:  Diagnosis Date  . Anemia   . Aortic stenosis    s/p pericardial AVR  . Aortic valve disorder   . Arthritis   . Benign prostatic hypertrophy   . CAD (coronary artery disease) 01/30/2011  . Carotid bruit   . COLONIC POLYPS 06/23/2008   Qualifier: Diagnosis of  By: Mare Ferrari, RMA, Sherri    . Coronary artery disease    s/p CABG 08/2010    . Diabetes mellitus without complication ([place])   . Dyslipidemia 10/15/2017  . Essential hypertension 08/21/2016  . H/O aortic valve replacement 08/21/2016  . Hyperlipidemia LDL goal <70 06/23/2008   Qualifier: Diagnosis of  By: Mare Ferrari, RMA, Sherri    . Orthostatic hypotension 06/22/2012  . Palpitations 08/21/2016  . Renal disorder    KIDNEY STONE  . Type 2 diabetes mellitus  with hyperlipidemia ([place]) 06/23/2008   Qualifier: Diagnosis of  By: Mare Ferrari, RMA, Sherri    . Unstable angina ([place]) 07/24/2011    Allergies No Known Allergies  IV Location/Drains/Wounds Patient Lines/Drains/Airways Status   Active Line/Drains/Airways    Name:   Placement date:   Placement time:   Site:   Days:   Peripheral IV 09/09/18 Left Antecubital   09/09/18    0044    Antecubital   less than 1   Peripheral IV 09/09/18 Right Antecubital   09/09/18    0100    Antecubital   less than 1   Urethral Catheter Katie, Rn  Straight-tip;Temperature probe 16 Fr.   09/09/18    0330    Straight-tip;Temperature probe   less than 1          Labs/Imaging Results for orders placed or performed during the hospital encounter of 09/09/18 (from the past 48 hour(s))  CBG monitoring, ED     Status: Abnormal   Collection Time: 09/09/18  1:06 AM  Result Value Ref Range   Glucose-Capillary 163 (H) 70 - 99 mg/dL  Lactic acid, plasma     Status: Abnormal   Collection Time: 09/09/18  1:10 AM  Result Value Ref Range   Lactic Acid, Venous 2.6 (HH) 0.5 - 1.9 mmol/L    Comment: CRITICAL RESULT CALLED TO, READ BACK BY AND VERIFIED WITHKathaleen Bury RN [id number] 09/09/18 A NAVARRO Performed at [facility], [place] [address] [place], [place] [address]   Comprehensive metabolic panel     Status: Abnormal   Collection Time: 09/09/18  1:10 AM  Result Value Ref Range   Sodium 147 (H) 135 - 145 mmol/L   Potassium 5.3 (H) 3.5 - 5.1 mmol/L   Chloride 112 (H) 98 - 111 mmol/L   CO2 17 (L) 22 - 32 mmol/L   Glucose, Bld 176 (H) 70 - 99 mg/dL   BUN 111 (H) 8 - 23 mg/dL    Comment: RESULTS CONFIRMED BY MANUAL DILUTION   Creatinine, Ser 4.99 (H) 0.61 - 1.24 mg/dL   Calcium 8.9 8.9 - 10.3 mg/dL   Total Protein 7.1 6.5 - 8.1 g/dL   Albumin 2.8 (L) 3.5 - 5.0 g/dL   AST 26 15 - 41 U/L   ALT 18 0 - 44 U/L   Alkaline Phosphatase 148 (H) 38 - 126 U/L   Total Bilirubin 2.2 (H) 0.3 - 1.2 mg/dL   GFR calc non Af Amer 10 (L) >60 mL/min   GFR calc Af Amer 12 (L) >60 mL/min   Anion gap 18 (H) 5 - 15    Comment: Performed at [facility], [place] [address], [place], [place] [address]  CBC WITH DIFFERENTIAL     Status: Abnormal   Collection Time: 09/09/18  1:10 AM  Result Value Ref Range   WBC 13.2 (H) 4.0 - 10.5 K/uL    Comment: WHITE COUNT CONFIRMED ON SMEAR   RBC 3.73 (L) 4.22 - 5.81 MIL/uL   Hemoglobin 11.3 (L) 13.0 - 17.0 g/dL   HCT 35.9 (L) 39.0 - 52.0 %   MCV 96.2 80.0 - 100.0 fL   MCH 30.3 26.0 - 34.0 pg    MCHC 31.5 30.0 - 36.0 g/dL   RDW 15.6 (H) 11.5 - 15.5 %   Platelets 219 150 - 400 K/uL   nRBC 0.0 0.0 - 0.2 %   Neutrophils Relative % 73 %   Neutro Abs 9.7 (H) 1.7 - 7.7 K/uL  Lymphocytes Relative 20 %   Lymphs Abs 2.7 0.7 - 4.0 K/uL   Monocytes Relative 6 %   Monocytes Absolute 0.8 0.1 - 1.0 K/uL   Eosinophils Relative 0 %   Eosinophils Absolute 0.0 0.0 - 0.5 K/uL   Basophils Relative 0 %   Basophils Absolute 0.0 0.0 - 0.1 K/uL   RBC Morphology MORPHOLOGY UNREMARKABLE    Immature Granulocytes 1 %   Abs Immature Granulocytes 0.06 0.00 - 0.07 K/uL    Comment: Performed at [facility], [place] [address] [place], [place] [address]  SARS Coronavirus 2 (CEPHEID - Performed in [place] hospital lab), Hosp Order     Status: None   Collection Time: 09/09/18  1:11 AM   Specimen: Nasopharyngeal Swab  Result Value Ref Range   SARS Coronavirus 2 NEGATIVE NEGATIVE    Comment: (NOTE) If result is NEGATIVE SARS-CoV-2 target nucleic acids are NOT DETECTED. The SARS-CoV-2 RNA is generally detectable in upper and lower  respiratory specimens during the acute phase of infection. The lowest  concentration of SARS-CoV-2 viral copies this assay can detect is 250  copies / mL. A negative result does not preclude SARS-CoV-2 infection  and should not be used as the sole basis for treatment or other  patient management decisions.  A negative result may occur with  improper specimen collection / handling, submission of specimen other  than nasopharyngeal swab, presence of viral mutation(s) within the  areas targeted by this assay, and inadequate number of viral copies  (<250 copies / mL). A negative result must be combined with clinical  observations, patient history, and epidemiological information. If result is POSITIVE SARS-CoV-2 target nucleic acids are DETECTED. The SARS-CoV-2 RNA is generally detectable in upper and lower  respiratory specimens dur ing the acute phase of  infection.  Positive  results are indicative of active infection with SARS-CoV-2.  Clinical  correlation with patient history and other diagnostic information is  necessary to determine patient infection status.  Positive results do  not rule out bacterial infection or co-infection with other viruses. If result is PRESUMPTIVE POSTIVE SARS-CoV-2 nucleic acids MAY BE PRESENT.   A presumptive positive result was obtained on the submitted specimen  and confirmed on repeat testing.  While 2019 novel coronavirus  (SARS-CoV-2) nucleic acids may be present in the submitted sample  additional confirmatory testing may be necessary for epidemiological  and / or clinical management purposes  to differentiate between  SARS-CoV-2 and other Sarbecovirus currently known to infect humans.  If clinically indicated additional testing with an alternate test  methodology [phone]) is advised. The SARS-CoV-2 RNA is generally  detectable in upper and lower respiratory sp ecimens during the acute  phase of infection. The expected result is Negative. Fact Sheet for Patients:  StrictlyIdeas.no Fact Sheet for Healthcare Providers: BankingDealers.co.za This test is not yet approved or cleared by the [place] FDA and has been authorized for detection and/or diagnosis of SARS-CoV-2 by FDA under an Emergency Use Authorization (EUA).  This EUA will remain in effect (meaning this test can be used) for the duration of the COVID-19 declaration under Section 564(b)(1) of the Act, 21 U.S.C. section 360bbb-3(b)(1), unless the authorization is terminated or revoked sooner. Performed at [facility], [place] [address], [place], [place] [address]   Valproic acid level     Status: Abnormal   Collection Time: 09/09/18  2:49 AM  Result Value Ref Range   Valproic Acid Lvl <10 (L) 50.0 -  100.0 ug/mL    Comment: RESULTS CONFIRMED BY MANUAL DILUTION Performed  at [place] [address] [place], [place] [address]   Salicylate level     Status: None   Collection Time: 09/09/18  2:49 AM  Result Value Ref Range   Salicylate Lvl <2.7 2.8 - 30.0 mg/dL    Comment: Performed at [facility], [place] [address], [place], [place] [address]  Bilirubin, fractionated(tot/dir/indir)     Status: Abnormal   Collection Time: 09/09/18  2:49 AM  Result Value Ref Range   Total Bilirubin 2.0 (H) 0.3 - 1.2 mg/dL   Bilirubin, Direct 0.9 (H) 0.0 - 0.2 mg/dL   Indirect Bilirubin 1.1 (H) 0.3 - 0.9 mg/dL    Comment: Performed at [facility], [place] [address] [place], [place] [address]  Lactic acid, plasma     Status: None   Collection Time: 09/09/18  3:10 AM  Result Value Ref Range   Lactic Acid, Venous 1.6 0.5 - 1.9 mmol/L    Comment: Performed at [facility], [place] [address], [place], [place] [address]  Urinalysis, Routine w reflex microscopic     Status: Abnormal   Collection Time: 09/09/18  4:26 AM  Result Value Ref Range   Color, Urine AMBER (A) YELLOW    Comment: BIOCHEMICALS MAY BE AFFECTED BY COLOR   APPearance HAZY (A) CLEAR   Specific Gravity, Urine 1.018 1.005 - 1.030   pH 5.0 5.0 - 8.0   Glucose, UA NEGATIVE NEGATIVE mg/dL   Hgb urine dipstick NEGATIVE NEGATIVE   Bilirubin Urine NEGATIVE NEGATIVE   Ketones, ur NEGATIVE NEGATIVE mg/dL   Protein, ur NEGATIVE NEGATIVE mg/dL   Nitrite NEGATIVE NEGATIVE   Leukocytes,Ua NEGATIVE NEGATIVE    Comment: Performed at [place] [address] [place], [place] [address]   Dg Chest Port 1 View  Result Date: 09/09/2018 CLINICAL DATA:  Sepsis EXAM: PORTABLE CHEST 1 VIEW COMPARISON:  04/18/2018 FINDINGS: Prior CABG and valve replacement. Heart is normal size. Lungs clear. No effusions or edema. No acute bony abnormality. Study limited by rotation. IMPRESSION: No visible acute cardiopulmonary process. Limited study  by rotation. Electronically Signed   By: Rolm Baptise M.D.   On: 09/09/2018 01:30    Pending Labs Unresulted Labs (From admission, onward)    Start     Ordered   09/09/18 0110  Blood Culture (routine x 2)  BLOOD CULTURE X 2,   STAT     09/09/18 0110   Signed and Held  Comprehensive metabolic panel  Tomorrow morning,   R     Signed and Held   Signed and Held  CBC WITH DIFFERENTIAL  Tomorrow morning,   R     Signed and Held   Signed and Held  Urine culture  Once,   R     Signed and Held   Signed and Held  Lactic acid, plasma  STAT Now then every 3 hours,   STAT     Signed and Held   Signed and Held  Sodium, urine, random  Once,   R     Signed and Held   Signed and Held  Creatinine, urine, random  Once,   R     Signed and Held   Signed and Held  Urea nitrogen, urine  Once,   R     Signed and Held          [employer]  09/09/18 0400 09/09/18 0410 09/09/18 0420 09/09/18 0430  BP: 96/70 100/69 103/68 102/73  Pulse: (!) 115 (!) 110 (!) 110 (!) 110  Resp: 14 (!) 21  (!) 21  Temp: (!) 97.5 F (36.4 C) (!) 97.5 F (36.4 C) (!) 97.3 F (36.3 C) (!) 97.3 F (36.3 C)  TempSrc:      SpO2: 100% 100% 98% 96%  Weight:      Height:        Isolation Precautions No active isolations  Medications Medications  sodium zirconium cyclosilicate (LOKELMA) packet 5 g (0 g Oral Hold 09/09/18 0343)  aerochamber Z-Stat Plus/medium (has no administration in time range)  vancomycin variable dose per unstable renal function (pharmacist dosing) (has no administration in time range)  ceFEPIme (MAXIPIME) 1 g in sodium chloride 0.9 % 100 mL IVPB (has no administration in time range)  ceFEPIme (MAXIPIME) 2 g in sodium chloride 0.9 % 100 mL IVPB (0 g Intravenous Stopped 09/09/18 0230)  metroNIDAZOLE (FLAGYL) IVPB 500 mg (0 mg Intravenous Stopped 09/09/18 0230)  sodium chloride 0.9 % bolus 500 mL (0 mLs Intravenous Stopped 09/09/18 0230)  vancomycin (VANCOCIN) 1,500 mg in sodium chloride  0.9 % 500 mL IVPB (0 mg Intravenous Stopped 09/09/18 0432)  sodium bicarbonate injection 50 mEq (50 mEq Intravenous Given 09/09/18 0319)  calcium gluconate 500 mg in sodium chloride 0.9 % 100 mL IVPB (0 mg Intravenous Stopped 09/09/18 0431)  albuterol (VENTOLIN HFA) 108 (90 Base) MCG/ACT inhaler 8 puff (8 puffs Inhalation Given 09/09/18 0337)  sodium chloride 0.9 % bolus 1,362 mL (1,362 mLs Intravenous New Bag/Given 09/09/18 0343)  aerochamber plus with mask device 1 each (1 each Other Given 09/09/18 0338)    Mobility non-ambulatory

## 2018-09-09 NOTE — Consult Note (Signed)
[place] Nurse wound consult note Reason for Consult: Patient admitted with deep tissue pressure injuries to coccyx, thoracic spine, right heel, right scapula Wound type:Pressure Pressure Injury POA: Yes Measurement: Right heel:  3.5cm round with no depth. Maroon purple discoloration, nonblanching Right scapula:  4cm x 3cm area of purple discoloration, non blanching Thoracic spine:  10cm x 2cm area of purple discoloration, non blanching Coccyx: 3cm x 7cm x 0.2cm area of purple discoloration with areas of sloughing epidermis (evolution). Partial thickness skin loss with pink, moist tissue. Scant serous exudate Wound bed: As described above Drainage (amount, consistency, odor) As described above Periwound:intact, very dry Dressing procedure/placement/frequency: Patient is on a mattress replacement with low air loss feature.  I will ask for continuation of therapy when patient transfers to floor.  Wound care will be turning and repositioning and topping with a silicone foam dressing. Until and unless nutritional (including hydration) and other disease states are normalized, we can expect decline or minimally stabilization of these wounds, not necessarily healing.  [place] nursing team will not follow, but will remain available to this patient, the nursing and medical teams.  Please re-consult if needed. Thanks, Maudie Flakes, MSN, RN, [place], Arther Abbott  Pager# [phone]

## 2018-09-09 NOTE — ED Provider Notes (Signed)
[facility] DEPT Provider Note   CSN: [id number] Arrival date & time: 09/09/18  0035     History   Chief Complaint Chief Complaint  Patient presents with  . Code Sepsis    HPI [name] is a 85 y.o. male.     The history is provided by the EMS personnel and medical records. The history is limited by the condition of the patient.  Altered Mental Status Presenting symptoms: partial responsiveness   Severity:  Severe Most recent episode:  Today Episode history:  Single Timing:  Constant Progression:  Improving Chronicity:  New Context: nursing home resident   Associated symptoms: decreased appetite   Associated symptoms: no abdominal pain and no hallucinations     Past Medical History:  Diagnosis Date  . Anemia   . Aortic stenosis    s/p pericardial AVR  . Aortic valve disorder   . Arthritis   . Benign prostatic hypertrophy   . CAD (coronary artery disease) 01/30/2011  . Carotid bruit   . COLONIC POLYPS 06/23/2008   Qualifier: Diagnosis of  By: Mare Ferrari, RMA, Sherri    . Coronary artery disease    s/p CABG 08/2010    . Diabetes mellitus without complication ([place])   . Dyslipidemia 10/15/2017  . Essential hypertension 08/21/2016  . H/O aortic valve replacement 08/21/2016  . Hyperlipidemia LDL goal <70 06/23/2008   Qualifier: Diagnosis of  By: Mare Ferrari, RMA, Sherri    . Orthostatic hypotension 06/22/2012  . Palpitations 08/21/2016  . Renal disorder    KIDNEY STONE  . Type 2 diabetes mellitus with hyperlipidemia ([place]) 06/23/2008   Qualifier: Diagnosis of  By: Mare Ferrari, RMA, Sherri    . Unstable angina ([place]) 07/24/2011    Patient Active Problem List   Diagnosis Date Noted  . Acute GI bleeding 07/15/2018  . GI bleed 07/15/2018  . Coronary artery disease involving native coronary artery of native heart without angina pectoris 07/08/2018  . CKD (chronic kidney disease), stage III ([place]) 07/08/2018  . Educated About Covid-19 Virus Infection  07/08/2018  . NSTEMI (non-ST elevated myocardial infarction) ([place]) 05/13/2018  . Sepsis due to Staphylococcus ([place]) 05/13/2018  . PICC (peripherally inserted central catheter) in place 05/12/2018  . Demand ischemia of myocardium ([place]) 04/20/2018  . Bacteremia 04/20/2018  . AMS (altered mental status) 04/18/2018  . Vomiting 04/16/2018  . Type 2 diabetes mellitus ([place]) 04/16/2018  . Cerebrovascular accident (CVA) ([place]) 02/11/2018  . Dementia without behavioral disturbance ([place]) 01/27/2018  . Falls frequently 01/27/2018  . DNR (do not resuscitate) 01/27/2018  . Diabetes mellitus due to underlying condition with diabetic autonomic neuropathy, without long-term current use of insulin ([place]) 01/21/2018  . Depression 12/26/2017  . Syncope   . Syncope and collapse   . Fall   . Microcytic anemia 12/25/2017  . Dyslipidemia 10/15/2017  . Leg swelling 10/15/2017  . Palpitations 08/21/2016  . HTN (hypertension) 08/21/2016  . Carotid bruit 08/21/2016  . H/O aortic valve replacement 08/21/2016  . Anemia, iron deficiency 04/11/2014  . Orthostatic hypotension 06/22/2012  . Dizzy 06/22/2012  . Precordial pain 09/18/2011  . Unstable angina ([place]) 07/24/2011  . Hx of CABG and AVR 01/30/2011  . COLONIC POLYPS 06/23/2008  . Hyperlipidemia LDL goal <70 06/23/2008  . Aortic valve disorder 06/23/2008  . BENIGN PROSTATIC HYPERTROPHY, HX OF 06/23/2008    Past Surgical History:  Procedure Laterality Date  . AORTIC VALVE REPLACEMENT    . AORTIC VALVE REPLACEMENT  08/24/10   36mm pericardial  tissue valve  . COLONOSCOPY WITH PROPOFOL    . COLONOSCOPY WITH PROPOFOL N/A 12/28/2017   Procedure: COLONOSCOPY WITH PROPOFOL;  Surgeon: Rogene Houston, MD;  [facility];  Service: Endoscopy;  Laterality: N/A;  . CORONARY ARTERY BYPASS GRAFT    . CORONARY ARTERY BYPASS GRAFT  08/24/10   LIMA to LAD, SVG to ramus intermediate, SVG to diagonal  . ESOPHAGOGASTRODUODENOSCOPY (EGD) WITH PROPOFOL    .  ESOPHAGOGASTRODUODENOSCOPY (EGD) WITH PROPOFOL N/A 12/28/2017   Procedure: ESOPHAGOGASTRODUODENOSCOPY (EGD) WITH PROPOFOL;  Surgeon: Rogene Houston, MD;  [facility];  Service: Endoscopy;  Laterality: N/A;  . ESOPHAGOGASTRODUODENOSCOPY (EGD) WITH PROPOFOL N/A 07/16/2018   Procedure: ESOPHAGOGASTRODUODENOSCOPY (EGD) WITH PROPOFOL;  Surgeon: Rogene Houston, MD;  [facility];  Service: Endoscopy;  Laterality: N/A;  . GIVENS CAPSULE STUDY N/A 07/16/2018   Procedure: GIVENS CAPSULE STUDY;  Surgeon: Rogene Houston, MD;  [facility];  Service: Endoscopy;  Laterality: N/A;  . HERNIA REPAIR    . knee replacements     x 2  . polypectomy    . POLYPECTOMY  12/28/2017   Procedure: POLYPECTOMY;  Surgeon: Rogene Houston, MD;  Location[facility];  Service: Endoscopy;;  transverse colon   . REPLACEMENT TOTAL KNEE BILATERAL    . TEE WITHOUT CARDIOVERSION    . TEE WITHOUT CARDIOVERSION N/A 04/21/2018   Procedure: TRANSESOPHAGEAL ECHOCARDIOGRAM (TEE);  Surgeon: Acie Fredrickson Wonda Cheng, MD;  Location: [facility] ENDOSCOPY;  Service: Cardiovascular;  Laterality: N/A;        Home Medications    Prior to Admission medications   Medication Sig Start Date End Date Taking? Authorizing Provider  acetaminophen (TYLENOL) 325 MG tablet Take 650 mg by mouth every 4 (four) hours as needed for mild pain.   Yes [provider]  acetaminophen (TYLENOL) 650 MG CR tablet Take 650 mg by mouth every 8 (eight) hours.   Yes [provider]  aspirin EC 81 MG tablet Take 1 tablet (81 mg total) by mouth every other day. 07/21/18 07/21/19 Yes Shah, Pratik D, DO  divalproex (DEPAKOTE) 125 MG DR tablet Take 125 mg by mouth 2 (two) times daily.   Yes [provider]  escitalopram (LEXAPRO) 5 MG tablet TAKE 1 TABLET ONCE DAILY IN THE EVENING Patient taking differently: Take 5 mg by mouth daily.  08/06/18  Yes Chipper Herb, MD  feeding supplement, GLUCERNA SHAKE, (GLUCERNA SHAKE)  LIQD Take 237 mLs by mouth 3 (three) times daily between meals.   Yes [provider]  Infant Care Products (DERMACLOUD) CREA Apply 1 application topically 2 (two) times a day.   Yes [provider]  metFORMIN (GLUCOPHAGE) 500 MG tablet Take 1 tablet (500 mg total) by mouth 2 (two) times daily. 12/25/17  Yes Chipper Herb, MD  mirtazapine (REMERON) 7.5 MG tablet Take 7.5 mg by mouth at bedtime.   Yes [provider]  nitroGLYCERIN (NITROSTAT) 0.4 MG SL tablet Place 1 tablet (0.4 mg total) under the tongue every 5 (five) minutes x 3 doses as needed for chest pain. 08/21/16  Yes Hochrein, Jeneen Rinks, MD  pantoprazole (PROTONIX) 40 MG tablet TAKE (1) TABLET TWICE A DAY BEFOR A MEAL Patient taking differently: Take 40 mg by mouth 2 (two) times daily.  08/06/18  Yes Chipper Herb, MD  polyethylene glycol [facility] Floria Raveling) packet Take 17 g by mouth daily. 02/06/18  Yes Chipper Herb, MD  rosuvastatin (CRESTOR) 40 MG tablet  Take 1 tablet (40 mg total) by mouth every evening. 12/25/17 09/09/18 Yes Chipper Herb, MD  tamsulosin (FLOMAX) 0.4 MG CAPS capsule TAKE (1) CAPSULE DAILY Patient taking differently: Take 0.4 mg by mouth daily.  06/26/18  Yes Gottschalk, Leatrice Jewels M, DO  ACCU-CHEK SOFTCLIX LANCETS lancets Use as instructed 09/15/15   Chipper Herb, MD  furosemide (LASIX) 20 MG tablet TAKE 2 TABLETS ON MONDAY, [place]. TAKE 1 TABLET ALL OTHERDAYS. Patient not taking: Reported on 09/09/2018 08/06/18   Chipper Herb, MD  glucose blood test strip CHECK BLOOD SUGAR UP TO 4 TIMES A DAY 03/31/18   Chipper Herb, MD  risperiDONE (RISPERDAL) 0.25 MG tablet TAKE  (1)  TABLET  THREE TIMES DAILY. Patient not taking: Reported on 09/09/2018 08/26/18   Chipper Herb, MD    Family History Family History  Problem Relation Age of Onset  . Cancer Mother   . Diabetes Father   . Hypertension Other   . Diabetes Other     Social History Social History   Tobacco Use  . Smoking  status: Never Smoker  . Smokeless tobacco: Never Used  Substance Use Topics  . Alcohol use: Yes    Frequency: Never    Comment: occassional  . Drug use: No     Allergies   Patient has no known allergies.   Review of Systems Review of Systems  Unable to perform ROS: Mental status change  Constitutional: Positive for appetite change and decreased appetite.  Respiratory: Positive for wheezing. Negative for cough.   Cardiovascular: Negative for chest pain.  Gastrointestinal: Negative for abdominal pain.  Psychiatric/Behavioral: Negative for hallucinations.     Physical Exam Updated Vital Signs BP 93/67   Pulse (!) 111   Resp (!) 23   Ht 5\' 7"  (1.702 m)   Wt 45.4 kg   SpO2 100%   BMI 15.66 kg/m   Physical Exam Vitals signs and nursing note reviewed.  Constitutional:      Appearance: He is cachectic. He is ill-appearing.  HENT:     Nose: Nose normal.     Mouth/Throat:     Pharynx: Oropharynx is clear.  Eyes:     Conjunctiva/sclera: Conjunctivae normal.     Pupils: Pupils are equal, round, and reactive to light.  Neck:     Musculoskeletal: Normal range of motion and neck supple. No neck rigidity.  Cardiovascular:     Rate and Rhythm: Regular rhythm. Tachycardia present.     Pulses: Normal pulses.     Heart sounds: Normal heart sounds.  Pulmonary:     Breath sounds: Decreased air movement present.  Chest:     Chest wall: No tenderness.  Abdominal:     General: Abdomen is flat. Bowel sounds are normal.     Tenderness: There is no abdominal tenderness. There is no guarding or rebound.  Musculoskeletal:        General: No deformity.  Skin:    Capillary Refill: Capillary refill takes less than 2 seconds.     Findings: Lesion present. No bruising.     Comments: 2 1.5 cm decibuti left scapula 1 1.5 cm decubiti left of spine 1 3 inch stage 3 decubitus ulcer gluteal cleft   Neurological:     GCS: GCS eye subscore is 3. GCS verbal subscore is 3. GCS motor subscore  is 5.  Psychiatric:     Comments: Unable       ED Treatments / Results  Labs (all  labs ordered are listed, but only abnormal results are displayed) Results for orders placed or performed during the hospital encounter of 09/09/18  SARS Coronavirus 2 (CEPHEID - Performed in [place] hospital lab), [facility] Order   Specimen: Nasopharyngeal Swab  Result Value Ref Range   SARS Coronavirus 2 NEGATIVE NEGATIVE  Lactic acid, plasma  Result Value Ref Range   Lactic Acid, Venous 2.6 (HH) 0.5 - 1.9 mmol/L  Comprehensive metabolic panel  Result Value Ref Range   Sodium 147 (H) 135 - 145 mmol/L   Potassium 5.3 (H) 3.5 - 5.1 mmol/L   Chloride 112 (H) 98 - 111 mmol/L   CO2 17 (L) 22 - 32 mmol/L   Glucose, Bld 176 (H) 70 - 99 mg/dL   BUN 111 (H) 8 - 23 mg/dL   Creatinine, Ser 4.99 (H) 0.61 - 1.24 mg/dL   Calcium 8.9 8.9 - 10.3 mg/dL   Total Protein 7.1 6.5 - 8.1 g/dL   Albumin 2.8 (L) 3.5 - 5.0 g/dL   AST 26 15 - 41 U/L   ALT 18 0 - 44 U/L   Alkaline Phosphatase 148 (H) 38 - 126 U/L   Total Bilirubin 2.2 (H) 0.3 - 1.2 mg/dL   GFR calc non Af Amer 10 (L) >60 mL/min   GFR calc Af Amer 12 (L) >60 mL/min   Anion gap 18 (H) 5 - 15  CBC WITH DIFFERENTIAL  Result Value Ref Range   WBC 13.2 (H) 4.0 - 10.5 K/uL   RBC 3.73 (L) 4.22 - 5.81 MIL/uL   Hemoglobin 11.3 (L) 13.0 - 17.0 g/dL   HCT 35.9 (L) 39.0 - 52.0 %   MCV 96.2 80.0 - 100.0 fL   MCH 30.3 26.0 - 34.0 pg   MCHC 31.5 30.0 - 36.0 g/dL   RDW 15.6 (H) 11.5 - 15.5 %   Platelets 219 150 - 400 K/uL   nRBC 0.0 0.0 - 0.2 %   Neutrophils Relative % 73 %   Neutro Abs 9.7 (H) 1.7 - 7.7 K/uL   Lymphocytes Relative 20 %   Lymphs Abs 2.7 0.7 - 4.0 K/uL   Monocytes Relative 6 %   Monocytes Absolute 0.8 0.1 - 1.0 K/uL   Eosinophils Relative 0 %   Eosinophils Absolute 0.0 0.0 - 0.5 K/uL   Basophils Relative 0 %   Basophils Absolute 0.0 0.0 - 0.1 K/uL   RBC Morphology MORPHOLOGY UNREMARKABLE    Immature Granulocytes 1 %   Abs Immature  Granulocytes 0.06 0.00 - 0.07 K/uL  CBG monitoring, ED  Result Value Ref Range   Glucose-Capillary 163 (H) 70 - 99 mg/dL   Dg Chest Port 1 View  Result Date: 09/09/2018 CLINICAL DATA:  Sepsis EXAM: PORTABLE CHEST 1 VIEW COMPARISON:  04/18/2018 FINDINGS: Prior CABG and valve replacement. Heart is normal size. Lungs clear. No effusions or edema. No acute bony abnormality. Study limited by rotation. IMPRESSION: No visible acute cardiopulmonary process. Limited study by rotation. Electronically Signed   By: Rolm Baptise M.D.   On: 09/09/2018 01:30    EKG EKG Interpretation  Date/Time:  Wednesday September 09 2018 01:28:39 EDT Ventricular Rate:  122 PR Interval:    QRS Duration: 107 QT Interval:  325 QTC Calculation: 463 R Axis:   -41 Text Interpretation:  Sinus tachycardia Left anterior fascicular block Consider anterior infarct Confirmed by Randal Buba, Mikko Lewellen ([id number]) on 09/09/2018 1:31:21 AM   Radiology Dg Chest Port 1 View  Result Date: 09/09/2018 CLINICAL DATA:  Sepsis EXAM:  PORTABLE CHEST 1 VIEW COMPARISON:  04/18/2018 FINDINGS: Prior CABG and valve replacement. Heart is normal size. Lungs clear. No effusions or edema. No acute bony abnormality. Study limited by rotation. IMPRESSION: No visible acute cardiopulmonary process. Limited study by rotation. Electronically Signed   By: Rolm Baptise M.D.   On: 09/09/2018 01:30    Procedures Procedures (including critical care time)  Medications Ordered in ED Medications  vancomycin (VANCOCIN) 1,500 mg in sodium chloride 0.9 % 500 mL IVPB (1,500 mg Intravenous New Bag/Given 09/09/18 0230)  sodium bicarbonate injection 50 mEq (has no administration in time range)  sodium zirconium cyclosilicate (LOKELMA) packet 5 g (has no administration in time range)  calcium gluconate 500 mg in sodium chloride 0.9 % 100 mL IVPB (has no administration in time range)  albuterol (VENTOLIN HFA) 108 (90 Base) MCG/ACT inhaler 8 puff (has no administration in time  range)  sodium chloride 0.9 % bolus 1,362 mL (has no administration in time range)  ceFEPIme (MAXIPIME) 2 g in sodium chloride 0.9 % 100 mL IVPB (0 g Intravenous Stopped 09/09/18 0230)  metroNIDAZOLE (FLAGYL) IVPB 500 mg (0 mg Intravenous Stopped 09/09/18 0230)  sodium chloride 0.9 % bolus 500 mL (500 mLs Intravenous New Bag/Given 09/09/18 0124)     MDM Reviewed: previous chart, nursing note and vitals Reviewed previous: labs Interpretation: labs, ECG and x-ray (hyoerkalemia with ekg changes renal failure dehydration and sepsis based on labs, no PNA on cxr by me ) Total time providing critical care: 75-105 minutes. This excludes time spent performing separately reportable procedures and services. Consults: admitting MD  CRITICAL CARE Performed by: Debbi Strandberg K Mada Sadik-Rasch Total critical care time: 75 minutes Critical care time was exclusive of separately billable procedures and treating other patients. Critical care was necessary to treat or prevent imminent or life-threatening deterioration. Critical care was time spent personally by me on the following activities: development of treatment plan with patient and/or surrogate as well as nursing, discussions with consultants, evaluation of patient's response to treatment, examination of patient, obtaining history from patient or surrogate, ordering and performing treatments and interventions, ordering and review of laboratory studies, ordering and review of radiographic studies, pulse oximetry and re-evaluation of patient's condition.   Final Clinical Impressions(s) / ED Diagnoses   Final diagnoses:  Sepsis without acute organ dysfunction, due to unspecified organism [facility])    Admit to medicine, patient is a DNR   Tavita Eastham, MD 09/09/18 [phone]

## 2018-09-09 NOTE — ED Notes (Signed)
Bed: XA12 Expected date:  Expected time:  Means of arrival:  Comments: EMS from SNF 85 yo Code Sepsis unresponsive

## 2018-09-09 NOTE — ED Triage Notes (Addendum)
Pt BIB [facility] from [facility]. Night shift staff went into pt room at 2300 to find pt unresponsive. Pt would not respond to painful stimuli. Pt is emaciated, global wheezing, tachycardic. For the past several days pt was reporting not feeling well nor wanting to eat.   Pt has received 400 ml of NS from EMS.        Pt is a DNR with paperwork at bedside.

## 2018-09-09 NOTE — Progress Notes (Signed)
Pharmacy Antibiotic Note  [name] is a 85 y.o. male admitted on 09/09/2018 with sepsis.  Pharmacy has been consulted for vancomycin and cefepime dosing.  Plan:  Cefepime 2 gm x 1 then 1 gm q24  Vancomycin 1500 mg IV loading dose given in ED (used wt of 69.8 kg from 07/19/2018) New weight 108 lbs 2 weeks ago per ED RN. She reports he is severely emaciated and entered 100 pounds as current weight. Will check vancomycin levels in a 4-5 days and redose when level < or = 20 mcg/ml F/u renal fxn. WBC, temp, culture data Vancomycin levels as needed  Height: 5\' 7"  (170.2 cm) Weight: 100 lb (45.4 kg) IBW/kg (Calculated) : 66.1  Temp (24hrs), Avg:97.2 F (36.2 C), Min:96.8 F (36 C), Max:97.5 F (36.4 C)  Recent Labs  Lab 09/09/18 0110 09/09/18 0310  WBC 13.2*  --   CREATININE 4.99*  --   LATICACIDVEN 2.6* 1.6    Estimated Creatinine Clearance: 7.2 mL/min (A) (by C-G formula based on SCr of 4.99 mg/dL (H)).    No Known Allergies  Antimicrobials this admission: 6/17 Cefepime>> 6/17 vanc>> 6/17 Flagyl x 1 Dose adjustments this admission:  Microbiology results: 6/17 COVID 19 neg 6/17 BCx2: sent  Thank you for allowing pharmacy to be a part of this patient's care.  Eudelia Bunch, Pharm.D 09/09/2018 4:26 AM

## 2018-09-09 NOTE — H&P (Signed)
History and Physical    [name] MIW:[id number] DOB: 08/09/1932 DOA: 09/09/2018  PCP: Chipper Herb, MD   Patient coming from: SNF   Chief Complaint: [name]   HPI: [name] is a 85 y.o. male with medical history significant for dementia, coronary artery disease, chronic systolic CHF, type 2 diabetes mellitus, and chronic kidney disease stage III, now presenting from his nursing facility where he was found to be unresponsive.  Per report of nursing facility personnel, patient had been complaining of not feeling well in general for the past several days and has not been eating anything over the same interval.  When the night shift staff checked on the patient, he was not responding to painful stimuli and EMS was called.  Patient was found to be hypotensive and given 500 cc normal saline prior to arrival in the ED.  Patient is opening his eyes spontaneously in the ED, answers yes or no questions intermittently, denies any pain anywhere.  ED Course: Upon arrival to the ED, patient is found to have a temp of 36 C, saturating low 90s on room air, mildly tachypneic, tachycardic in the 120s, and with initial blood pressure of 65 systolic.  EKG features sinus tachycardia with rate 122 and LAFB.  Chest x-ray is negative for acute cardiopulmonary disease.  Chemistry panel is notable for sodium of 147, potassium 5.3, bicarbonate 17, BUN 111, and creatinine 4.99, up from 1.3 in April.  Total bilirubin is elevated to 2.2, normal in April.  CBC features a leukocytosis to 13,200.  Lactic acid is elevated to 2.6.  Blood cultures were collected, 30 cc/kg NS bolus was given, and the patient was treated with broad-spectrum empiric antibiotics.  Heart rate and blood pressure improved, patient has become more responsive, and hospitalists are asked to admit.  Review of Systems:  Unable to complete ROS secondary to the patient's clinical condition.  Past Medical History:  Diagnosis Date  . Anemia   .  Aortic stenosis    s/p pericardial AVR  . Aortic valve disorder   . Arthritis   . Benign prostatic hypertrophy   . CAD (coronary artery disease) 01/30/2011  . Carotid bruit   . COLONIC POLYPS 06/23/2008   Qualifier: Diagnosis of  By: Mare Ferrari, RMA, Sherri    . Coronary artery disease    s/p CABG 08/2010    . Diabetes mellitus without complication ([place])   . Dyslipidemia 10/15/2017  . Essential hypertension 08/21/2016  . H/O aortic valve replacement 08/21/2016  . Hyperlipidemia LDL goal <70 06/23/2008   Qualifier: Diagnosis of  By: Mare Ferrari, RMA, Sherri    . Orthostatic hypotension 06/22/2012  . Palpitations 08/21/2016  . Renal disorder    KIDNEY STONE  . Type 2 diabetes mellitus with hyperlipidemia ([place]) 06/23/2008   Qualifier: Diagnosis of  By: Mare Ferrari, RMA, Sherri    . Unstable angina ([place]) 07/24/2011    Past Surgical History:  Procedure Laterality Date  . AORTIC VALVE REPLACEMENT    . AORTIC VALVE REPLACEMENT  08/24/10   44mm pericardial tissue valve  . COLONOSCOPY WITH PROPOFOL    . COLONOSCOPY WITH PROPOFOL N/A 12/28/2017   Procedure: COLONOSCOPY WITH PROPOFOL;  Surgeon: Rogene Houston, MD;  [facility];  Service: Endoscopy;  Laterality: N/A;  . CORONARY ARTERY BYPASS GRAFT    . CORONARY ARTERY BYPASS GRAFT  08/24/10   LIMA to LAD, SVG to ramus intermediate, SVG to diagonal  . ESOPHAGOGASTRODUODENOSCOPY (EGD) WITH PROPOFOL    .  ESOPHAGOGASTRODUODENOSCOPY (EGD) WITH PROPOFOL N/A 12/28/2017   Procedure: ESOPHAGOGASTRODUODENOSCOPY (EGD) WITH PROPOFOL;  Surgeon: Rogene Houston, MD;  [facility];  Service: Endoscopy;  Laterality: N/A;  . ESOPHAGOGASTRODUODENOSCOPY (EGD) WITH PROPOFOL N/A 07/16/2018   Procedure: ESOPHAGOGASTRODUODENOSCOPY (EGD) WITH PROPOFOL;  Surgeon: Rogene Houston, MD;  [facility];  Service: Endoscopy;  Laterality: N/A;  . GIVENS CAPSULE STUDY N/A 07/16/2018   Procedure: GIVENS CAPSULE STUDY;  Surgeon: Rogene Houston, MD;  Location: [facility];  Service: Endoscopy;  Laterality: N/A;  . HERNIA REPAIR    . knee replacements     x 2  . polypectomy    . POLYPECTOMY  12/28/2017   Procedure: POLYPECTOMY;  Surgeon: Rogene Houston, MD;  Location: [facility];  Service: Endoscopy;;  transverse colon   . REPLACEMENT TOTAL KNEE BILATERAL    . TEE WITHOUT CARDIOVERSION    . TEE WITHOUT CARDIOVERSION N/A 04/21/2018   Procedure: TRANSESOPHAGEAL ECHOCARDIOGRAM (TEE);  Surgeon: Acie Fredrickson Wonda Cheng, MD;  Location: [facility] ENDOSCOPY;  Service: Cardiovascular;  Laterality: N/A;     reports that he has never smoked. He has never used smokeless tobacco. He reports current alcohol use. He reports that he does not use drugs.  No Known Allergies  Family History  Problem Relation Age of Onset  . Cancer Mother   . Diabetes Father   . Hypertension Other   . Diabetes Other      Prior to Admission medications   Medication Sig Start Date End Date Taking? Authorizing Provider  acetaminophen (TYLENOL) 325 MG tablet Take 650 mg by mouth every 4 (four) hours as needed for mild pain.   Yes [provider]  acetaminophen (TYLENOL) 650 MG CR tablet Take 650 mg by mouth every 8 (eight) hours.   Yes [provider]  aspirin EC 81 MG tablet Take 1 tablet (81 mg total) by mouth every other day. 07/21/18 07/21/19 Yes Shah, Pratik D, DO  divalproex (DEPAKOTE) 125 MG DR tablet Take 125 mg by mouth 2 (two) times daily.   Yes [provider]  escitalopram (LEXAPRO) 5 MG tablet TAKE 1 TABLET ONCE DAILY IN THE EVENING Patient taking differently: Take 5 mg by mouth daily.  08/06/18  Yes Chipper Herb, MD  feeding supplement, GLUCERNA SHAKE, (GLUCERNA SHAKE) LIQD Take 237 mLs by mouth 3 (three) times daily between meals.   Yes [provider]  Infant Care Products (DERMACLOUD) CREA Apply 1 application topically 2 (two) times a day.   Yes [provider]  metFORMIN (GLUCOPHAGE) 500 MG tablet Take 1 tablet (500 mg total)  by mouth 2 (two) times daily. 12/25/17  Yes Chipper Herb, MD  mirtazapine (REMERON) 7.5 MG tablet Take 7.5 mg by mouth at bedtime.   Yes [provider]  nitroGLYCERIN (NITROSTAT) 0.4 MG SL tablet Place 1 tablet (0.4 mg total) under the tongue every 5 (five) minutes x 3 doses as needed for chest pain. 08/21/16  Yes Hochrein, Jeneen Rinks, MD  pantoprazole (PROTONIX) 40 MG tablet TAKE (1) TABLET TWICE A DAY BEFOR A MEAL Patient taking differently: Take 40 mg by mouth 2 (two) times daily.  08/06/18  Yes Chipper Herb, MD  polyethylene glycol [facility] / Floria Raveling) packet Take 17 g by mouth daily. 02/06/18  Yes Chipper Herb, MD  rosuvastatin (CRESTOR) 40 MG tablet Take 1 tablet (40 mg total) by mouth every evening. 12/25/17 09/09/18 Yes Chipper Herb, MD  tamsulosin (FLOMAX) 0.4 MG CAPS  capsule TAKE (1) CAPSULE DAILY Patient taking differently: Take 0.4 mg by mouth daily.  06/26/18  Yes Gottschalk, Leatrice Jewels M, DO  ACCU-CHEK SOFTCLIX LANCETS lancets Use as instructed 09/15/15   Chipper Herb, MD  furosemide (LASIX) 20 MG tablet TAKE 2 TABLETS ON MONDAY, [place]. TAKE 1 TABLET ALL OTHERDAYS. Patient not taking: Reported on 09/09/2018 08/06/18   Chipper Herb, MD  glucose blood test strip CHECK BLOOD SUGAR UP TO 4 TIMES A DAY 03/31/18   Chipper Herb, MD  risperiDONE (RISPERDAL) 0.25 MG tablet TAKE  (1)  TABLET  THREE TIMES DAILY. Patient not taking: Reported on 09/09/2018 08/26/18   Chipper Herb, MD    Physical Exam: Vitals:   09/09/18 0230 09/09/18 0250 09/09/18 0329 09/09/18 0337  BP: 96/66 93/67 92/67    Pulse: (!) 115 (!) 111 (!) 114   Resp: 20 (!) 23 (!) 23   Temp:   (!) 96.8 F (36 C)   TempSrc:   Core   SpO2: 100% 100% 100% 97%  Weight:      Height:        Constitutional: NAD, cachectic  Eyes: PERTLA, lids and conjunctivae normal ENMT: Mucous membranes are moist. Posterior pharynx clear of any exudate or lesions.   Neck: normal, supple, no masses, no thyromegaly  Respiratory: Coarse rhonchi, no wheezing. No accessory muscle use.  Cardiovascular: S1 & S2 heard, regular rate and rhythm. No extremity edema.   Abdomen: No distension, soft. Bowel sounds active.  Musculoskeletal: no clubbing / cyanosis. No joint deformity upper and lower extremities.   Skin: Poor turgor. Pressure ulcers. No pallor or cyanosis. No diaphoresis.  Neurologic: CN 2-12 grossly intact. Patellar DTRs intact. Moving all extremities.   Psychiatric: Lethargic. Opens eyes spontaneously. Answers yes/no questions intermittently.    Labs on Admission: I have personally reviewed following labs and imaging studies  CBC: Recent Labs  Lab 09/09/18 0110  WBC 13.2*  NEUTROABS 9.7*  HGB 11.3*  HCT 35.9*  MCV 96.2  PLT 924   Basic Metabolic Panel: Recent Labs  Lab 09/09/18 0110  NA 147*  K 5.3*  CL 112*  CO2 17*  GLUCOSE 176*  BUN 111*  CREATININE 4.99*  CALCIUM 8.9   GFR: Estimated Creatinine Clearance: 7.2 mL/min (A) (by C-G formula based on SCr of 4.99 mg/dL (H)). Liver Function Tests: Recent Labs  Lab 09/09/18 0110  AST 26  ALT 18  ALKPHOS 148*  BILITOT 2.2*  PROT 7.1  ALBUMIN 2.8*   No results for input(s): LIPASE, AMYLASE in the last 168 hours. No results for input(s): AMMONIA in the last 168 hours. Coagulation Profile: No results for input(s): INR, PROTIME in the last 168 hours. Cardiac Enzymes: No results for input(s): CKTOTAL, CKMB, CKMBINDEX, TROPONINI in the last 168 hours. BNP (last 3 results) No results for input(s): PROBNP in the last 8760 hours. HbA1C: No results for input(s): HGBA1C in the last 72 hours. CBG: Recent Labs  Lab 09/09/18 0106  GLUCAP 163*   Lipid Profile: No results for input(s): CHOL, HDL, LDLCALC, TRIG, CHOLHDL, LDLDIRECT in the last 72 hours. Thyroid Function Tests: No results for input(s): TSH, T4TOTAL, FREET4, T3FREE, THYROIDAB in the last 72 hours. Anemia Panel: No results for input(s): VITAMINB12, FOLATE, FERRITIN,  TIBC, IRON, RETICCTPCT in the last 72 hours. Urine analysis:    Component Value Date/Time   COLORURINE YELLOW 04/18/2018 1316   APPEARANCEUR Clear 06/09/2018 0944   LABSPEC 1.017 04/18/2018 1316   PHURINE 5.0 04/18/2018  [place] Negative 06/09/2018 0944   HGBUR LARGE (A) 04/18/2018 1316   BILIRUBINUR Negative 06/09/2018 0944   KETONESUR 20 (A) 04/18/2018 1316   PROTEINUR 2+ (A) 06/09/2018 0944   PROTEINUR 30 (A) 04/18/2018 1316   UROBILINOGEN negative 08/23/2014 1030   UROBILINOGEN 0.2 08/21/2010 1150   NITRITE Negative 06/09/2018 0944   NITRITE NEGATIVE 04/18/2018 1316   LEUKOCYTESUR Negative 06/09/2018 0944   Sepsis Labs: @LABRCNTIP (procalcitonin:4,lacticidven:4) ) Recent Results (from the past 240 hour(s))  SARS Coronavirus 2 (CEPHEID - Performed in [place] hospital lab), Hosp Order     Status: None   Collection Time: 09/09/18  1:11 AM   Specimen: Nasopharyngeal Swab  Result Value Ref Range Status   SARS Coronavirus 2 NEGATIVE NEGATIVE Final    Comment: (NOTE) If result is NEGATIVE SARS-CoV-2 target nucleic acids are NOT DETECTED. The SARS-CoV-2 RNA is generally detectable in upper and lower  respiratory specimens during the acute phase of infection. The lowest  concentration of SARS-CoV-2 viral copies this assay can detect is 250  copies / mL. A negative result does not preclude SARS-CoV-2 infection  and should not be used as the sole basis for treatment or other  patient management decisions.  A negative result may occur with  improper specimen collection / handling, submission of specimen other  than nasopharyngeal swab, presence of viral mutation(s) within the  areas targeted by this assay, and inadequate number of viral copies  (<250 copies / mL). A negative result must be combined with clinical  observations, patient history, and epidemiological information. If result is POSITIVE SARS-CoV-2 target nucleic acids are DETECTED. The SARS-CoV-2 RNA is  generally detectable in upper and lower  respiratory specimens dur ing the acute phase of infection.  Positive  results are indicative of active infection with SARS-CoV-2.  Clinical  correlation with patient history and other diagnostic information is  necessary to determine patient infection status.  Positive results do  not rule out bacterial infection or co-infection with other viruses. If result is PRESUMPTIVE POSTIVE SARS-CoV-2 nucleic acids MAY BE PRESENT.   A presumptive positive result was obtained on the submitted specimen  and confirmed on repeat testing.  While 2019 novel coronavirus  (SARS-CoV-2) nucleic acids may be present in the submitted sample  additional confirmatory testing may be necessary for epidemiological  and / or clinical management purposes  to differentiate between  SARS-CoV-2 and other Sarbecovirus currently known to infect humans.  If clinically indicated additional testing with an alternate test  methodology [phone]) is advised. The SARS-CoV-2 RNA is generally  detectable in upper and lower respiratory sp ecimens during the acute  phase of infection. The expected result is Negative. Fact Sheet for Patients:  StrictlyIdeas.no Fact Sheet for Healthcare Providers: BankingDealers.co.za This test is not yet approved or cleared by the [place] FDA and has been authorized for detection and/or diagnosis of SARS-CoV-2 by FDA under an Emergency Use Authorization (EUA).  This EUA will remain in effect (meaning this test can be used) for the duration of the COVID-19 declaration under Section 564(b)(1) of the Act, 21 U.S.C. section 360bbb-3(b)(1), unless the authorization is terminated or revoked sooner. Performed at [facility], [place] [address] [place], [place] [address]      Radiological Exams on Admission: Dg Chest Port 1 View  Result Date: 09/09/2018 CLINICAL DATA:  Sepsis EXAM:  PORTABLE CHEST 1 VIEW COMPARISON:  04/18/2018 FINDINGS: Prior CABG and valve replacement. Heart is normal size. Lungs clear. No effusions  or edema. No acute bony abnormality. Study limited by rotation. IMPRESSION: No visible acute cardiopulmonary process. Limited study by rotation. Electronically Signed   By: Rolm Baptise M.D.   On: 09/09/2018 01:30    EKG: Independently reviewed. Sinus tachycardia (rate 122), LAFB.   Assessment/Plan   1. Sepsis  - Presents from SNF where he was poorly responsive after not eating or drinking for several days  - He is afebrile with leukocytosis, SBP 60's, elevated lactate, acute renal failure, clear CXR, negative COVID-19  - Blood cultures were collected in ED, 30 cc/kg bolus given, and empiric antibiotics started  - Pressure sores do not appear acutely infected; there is no meningismus  - Check UA and RUQ [place], continue empiric antibiotics, trend lactate, follow cultures and clinical course    2. Acute kidney injury superimposed on CKD III  - SCr is 4.99 on admission, up from 1.3 in April 2020  - Likely prerenal azotemia in setting of sepsis and not eating or drinking for several days; ATN possible  - He has been fluid-resuscitated in ED with normal saline  - Check renal US and urine chemistries, renally-dose medications, avoid nephrotoxins, repeat chem panel in am    3. Chronic systolic CHF  - Hypovolemic on admission in setting of recent anorexia  - Follow I/O and daily wt   4. CAD  - No anginal complaints  - Continue ASA and statin    5. Hyperbilirubinemia  - Total bilirubin is 2.2 on admission, previously normal  - Does not seem particularly tender in RUQ  - Check abd [place], fractionate bili, continue antibiotics   6. Hypernatremia  - Serum sodium is 147 on admission in setting of dehydration   - He was fluid-resuscitated in ED with NS   - Repeat chem panel     PPE: Mask, face shield  DVT prophylaxis: sq heparin  Code Status: DNR  Family  Communication: Discussed with patient  Consults called: None Admission status: Inpatient. Patient is critically-ill with sepsis complicated by acute renal failure and AMS, and he will require aggressive inpatient management to improve chances of survival.    Vianne Bulls, MD [facility] Pager [phone]  If 7PM-7AM, please contact night-coverage [email or web address] Password [email or web address]  09/09/2018, 4:00 AM

## 2018-09-09 NOTE — ED Notes (Signed)
EKG given to EDP,Palumbo,MD., for review. 

## 2018-09-09 NOTE — Progress Notes (Signed)
I did call [facility] to gather more information about [name].  I did spoke with nursing staff who used to take care of him.  Apparently for the last 2 weeks he has developed rapid and progressive weakness. 14 days ago he was able to drink using a straw with difficulty, he had a flat affect, not very communicative, and very somnolent.  About a week ago he became severely weak to the point where he was not able to eat or drink anything.  He was getting his meds crushed,and  he was noted to be very depressed and somnolent.  The night when he was brought into the hospital is blood pressure was as low as 65/44 mmHg.

## 2018-09-09 NOTE — ED Notes (Signed)
Date and time results received: 09/09/18 0146 (use smartphrase ".now" to insert current time)  Test: Lactic Acid Critical Value: 2.6  Name of Provider Notified: Randal Buba, MD  Orders Received? Or Actions Taken?: Orders Received - See Orders for details

## 2018-09-09 NOTE — Progress Notes (Signed)
A consult was received from an ED physician for cefepime and vancomycin per pharmacy dosing.  The patient's profile has been reviewed for ht/wt/allergies/indication/available labs.   A one time order has been placed for cefepime 2 gm and vancomycin 1500 mg.    Further antibiotics/pharmacy consults should be ordered by admitting physician if indicated.                       Thank you, Eudelia Bunch, Pharm.D 09/09/2018 1:19 AM

## 2018-09-09 NOTE — Evaluation (Signed)
Clinical/Bedside Swallow Evaluation Patient Details  Name: PHELAN SCHADT MRN: [id number] Date of Birth: [birth year]  Today's Date: 09/09/2018 Time: SLP Start Time (ACUTE ONLY): 1350 SLP Stop Time (ACUTE ONLY): 1415 SLP Time Calculation (min) (ACUTE ONLY): 25 min  Past Medical History:  Past Medical History:  Diagnosis Date  . Anemia   . Aortic stenosis    s/p pericardial AVR  . Aortic valve disorder   . Arthritis   . Benign prostatic hypertrophy   . CAD (coronary artery disease) 01/30/2011  . Carotid bruit   . COLONIC POLYPS 06/23/2008   Qualifier: Diagnosis of  By: Mare Ferrari, RMA, Sherri    . Coronary artery disease    s/p CABG 08/2010    . Diabetes mellitus without complication ([place])   . Dyslipidemia 10/15/2017  . Essential hypertension 08/21/2016  . H/O aortic valve replacement 08/21/2016  . Hyperlipidemia LDL goal <70 06/23/2008   Qualifier: Diagnosis of  By: Mare Ferrari, RMA, Sherri    . Orthostatic hypotension 06/22/2012  . Palpitations 08/21/2016  . Renal disorder    KIDNEY STONE  . Type 2 diabetes mellitus with hyperlipidemia ([place]) 06/23/2008   Qualifier: Diagnosis of  By: Mare Ferrari, RMA, Sherri    . Unstable angina ([place]) 07/24/2011   Past Surgical History:  Past Surgical History:  Procedure Laterality Date  . AORTIC VALVE REPLACEMENT    . AORTIC VALVE REPLACEMENT  08/24/10   58mm pericardial tissue valve  . COLONOSCOPY WITH PROPOFOL    . COLONOSCOPY WITH PROPOFOL N/A 12/28/2017   Procedure: COLONOSCOPY WITH PROPOFOL;  Surgeon: Rogene Houston, MD;  [facility]: [facility];  Service: Endoscopy;  Laterality: N/A;  . CORONARY ARTERY BYPASS GRAFT    . CORONARY ARTERY BYPASS GRAFT  08/24/10   LIMA to LAD, SVG to ramus intermediate, SVG to diagonal  . ESOPHAGOGASTRODUODENOSCOPY (EGD) WITH PROPOFOL    . ESOPHAGOGASTRODUODENOSCOPY (EGD) WITH PROPOFOL N/A 12/28/2017   Procedure: ESOPHAGOGASTRODUODENOSCOPY (EGD) WITH PROPOFOL;  Surgeon: Rogene Houston, MD;  Location: [facility];  Service:  Endoscopy;  Laterality: N/A;  . ESOPHAGOGASTRODUODENOSCOPY (EGD) WITH PROPOFOL N/A 07/16/2018   Procedure: ESOPHAGOGASTRODUODENOSCOPY (EGD) WITH PROPOFOL;  Surgeon: Rogene Houston, MD;  Location: [facility];  Service: Endoscopy;  Laterality: N/A;  . GIVENS CAPSULE STUDY N/A 07/16/2018   Procedure: GIVENS CAPSULE STUDY;  Surgeon: Rogene Houston, MD;  Location: [facility];  Service: Endoscopy;  Laterality: N/A;  . HERNIA REPAIR    . knee replacements     x 2  . polypectomy    . POLYPECTOMY  12/28/2017   Procedure: POLYPECTOMY;  Surgeon: Rogene Houston, MD;  Location: [facility];  Service: Endoscopy;;  transverse colon   . REPLACEMENT TOTAL KNEE BILATERAL    . TEE WITHOUT CARDIOVERSION    . TEE WITHOUT CARDIOVERSION N/A 04/21/2018   Procedure: TRANSESOPHAGEAL ECHOCARDIOGRAM (TEE);  Surgeon: Acie Fredrickson, Wonda Cheng, MD;  Location: [facility] ENDOSCOPY;  Service: Cardiovascular;  Laterality: N/A;   HPI:  Patient is an 85 y.o. male with PMH: dementia, CAD, chronic systolic CHF, PZ-0,C chronic kidney disease state III who presented from his SNF after being found unresponsive. SNF also reporting that he had been complaining of not feeling well and was not eating much of anything in past few days. In ED, patient was hypotensive. Primary diagnosis for this admission is sepsis.   Assessment / Plan / Recommendation Clinical Impression  Patient presents with a mod-severe oropharyngeal dysphagia which appears to be primarily or at least significantly impacted  by his current cognitive status. SLP performed significant amount of oral care as patient had residuals of what appeared to be old crushed medications and applesauce/pudding throughout oral cavity (buccal cavities, hard and soft palate, teeth and gumlines and on top of tongue. Patient was able to initaite swallow with thin liquids via spoon sip but exhibited an immediate and delayed cough each time. When 1/2 teaspoon of applesauce presented, patient was  unable to move this bolus at all and SLP suctioned it out of mouth. Patient is not safe for any PO's at this time. SLP Visit Diagnosis: Dysphagia, unspecified (R13.10)    Aspiration Risk  Severe aspiration risk;Risk for inadequate nutrition/hydration;Moderate aspiration risk    Diet Recommendation NPO   Medication Administration: Via alternative means    Other  Recommendations Oral Care Recommendations: Oral care QID   Follow up Recommendations 24 hour supervision/assistance;Skilled Nursing facility      Frequency and Duration min 2x/week  1 week       Prognosis Prognosis for Safe Diet Advancement: Fair Barriers to Reach Goals: Cognitive deficits;Severity of deficits      Swallow Study   General Date of Onset: 09/09/18 HPI: Patient is an 85 y.o. male with PMH: dementia, CAD, chronic systolic CHF, FK-8,L chronic kidney disease state III who presented from his SNF after being found unresponsive. SNF also reporting that he had been complaining of not feeling well and was not eating much of anything in past few days. In ED, patient was hypotensive. Primary diagnosis for this admission is sepsis. Type of Study: Bedside Swallow Evaluation Previous Swallow Assessment: N/A Diet Prior to this Study: NPO Temperature Spikes Noted: No Respiratory Status: Room air History of Recent Intubation: No Behavior/Cognition: Alert;Cooperative;Pleasant mood;Requires cueing Oral Cavity Assessment: Other (comment)(oral residuals which appeared to be old crushed medications were observed throughout oral cavity) Oral Care Completed by SLP: Yes Oral Cavity - Dentition: Poor condition;Missing dentition Self-Feeding Abilities: Total assist Patient Positioning: Upright in bed Baseline Vocal Quality: Other (comment)(patient very rarely would voice) Volitional Cough: Cognitively unable to elicit Volitional Swallow: Unable to elicit    Oral/Motor/Sensory Function Overall Oral Motor/Sensory Function:  Generalized oral weakness Facial ROM: Within Functional Limits Facial Symmetry: Within Functional Limits Facial Strength: Reduced right;Reduced left Lingual ROM: Reduced right;Reduced left Lingual Symmetry: Within Functional Limits Lingual Strength: Reduced   Ice Chips Ice chips: Not tested   Thin Liquid Thin Liquid: Impaired Presentation: Spoon Oral Phase Impairments: Reduced labial seal;Reduced lingual movement/coordination Pharyngeal  Phase Impairments: Suspected delayed Swallow;Cough - Immediate;Cough - Delayed;Multiple swallows    Nectar Thick     Honey Thick     Puree Puree: Impaired Oral Phase Impairments: Reduced labial seal;Reduced lingual movement/coordination;Poor awareness of bolus Other Comments: Patient unable to move small (1/2 teaspoon) bolus of puree (applesauce) anterior to posterior and SLP suctioned everything out   Solid     Solid: Not tested      Nadara Mode Tarrell 09/09/2018,3:58 PM    Sonia Baller, MA, CCC-SLP Speech Therapy [facility]  Acute Rehab

## 2018-09-09 NOTE — Progress Notes (Signed)
PROGRESS NOTE    [name]  HYQ:[id number] DOB: [birth year] DOA: 09/09/2018 PCP: [name], MD    Brief Narrative:  85 year old male who presented with altered mentation.  He does have significant past medical history for dementia, systolic heart failure, type 2 diabetes mellitus and stage III chronic kidney disease.  He was found unresponsive at skilled nursing facility, apparently he complained of not feeling well in general for last several days, decreased p.o. intake.  He was found unresponsive and hypotensive, EMS brought him to the hospital.  On his initial physical examination his blood pressure was 96/66, heart rate 115, respiratory 23, temperature 36 C, oxygen saturation 100% he was cachectic, moist mucous membranes, coarse breath sounds bilaterally, heart S1-S2 present rhythm, the abdomen was soft nontender, no lower extremity edema, he was lethargic, opening his eyes spontaneously answering only to yes/no questions intermittently.  Sodium 147, potassium 5.3, chloride 112, bicarb 17, glucose 176, BUN 111, creatinine 4.9, AST 26, ALT 18, lactic acid 2.6 (venous), white count 13.2, hemoglobin 11.3, hematocrit 35.9, platelets 219.  SARS COVID-19 was negative.  Urinalysis negative for infection.  His chest radiograph had left rotation, no infiltrates.  EKG 122 bpm, with left axis deviation, sinus rhythm, left anterior fascicular block, normal intervals, poor R wave progression, no ST segment or T wave changes.  Patient was admitted to the hospital with a working diagnosis of sepsis.   Assessment & Plan:   Principal Problem:   Severe sepsis (HCC) Active Problems:   Hx of CABG and AVR   Dementia without behavioral disturbance (HCC)   Acute renal failure superimposed on stage 3 chronic kidney disease (HCC)   Chronic systolic CHF (congestive heart failure) (HCC)   1. Suspected sepsis. Patient continue to have altered mentation and low blood pressure with systolic down to 91 mmHg  with HR at 109. His WBC is stable at 13, and cultures are no growth. No source of infection identified yet. For now will continue antibiotic therapy for the next 24 H, if no source of infection will dc antibiotic therapy in am. (hold on vancomycin low suspicion for MRSA infection),   2. Oliguric AKI on CKD stage 3 with uremia/ anion gap metabolic acidosis/ hyperkalemia. Patient clinically hypovolemic, has worsening renal function today with BUN up to 131, serum cr at 4,7 with K at 5,0, serum bicarbonate at 19 from 17 and anion gap at 20. Urine output is low at 190 ml since admission. Urine analysis with no protein, no casts or sediment. At home patient on furosemide. Will check urine creatinine, protein, electrolytes and osmolality. Check serum osmolality. Strict in and out, he has a foley catheter. Follow with renal US. Will give a bolus of 500 ml of isotonic saline and will increase 0.45% saline to 125 ml/ H. Patient not candidate for renal replacement therapy.   3. Metabolic encephalopathy. Likely uremic encephalopathy. Will continue neuro checks, aspiration precautions, swallow evaluation. Keep npo for now.   4. Dementia with severe calorie protein malnutrition. Patient cachetic, not clear baseline, will ask nutrition for consultation.    5. Pressure ulcers. Present on admission. Unable to stage lesions in his back, coccyx and heals. Continue local wound care.    DVT prophylaxis: heparin   Code Status: DNR Family Communication: no family at beside Disposition Plan/ discharge barriers: pending clinical improvement.   Body mass index is 18.65 kg/m. Malnutrition Type:      Malnutrition Characteristics:      Nutrition Interventions:  RN Pressure Injury Documentation: Pressure Injury 09/09/18 Coccyx Mid Unstageable - Full thickness tissue loss in which the base of the ulcer is covered by slough (yellow, tan, gray, green or brown) and/or eschar (tan, brown or black) in the wound bed.  (Active)  09/09/18 0530  Location: Coccyx  Location Orientation: Mid  Staging: Unstageable - Full thickness tissue loss in which the base of the ulcer is covered by slough (yellow, tan, gray, green or brown) and/or eschar (tan, brown or black) in the wound bed.  Wound Description (Comments):   Present on Admission: Yes     Pressure Injury 09/09/18 Vertebral column Mid Deep Tissue Injury - Purple or maroon localized area of discolored intact skin or blood-filled blister due to damage of underlying soft tissue from pressure and/or shear. Multiple 2" DTI on vertebrae (Active)  09/09/18 0530  Location: Vertebral column  Location Orientation: Mid  Staging: Deep Tissue Injury - Purple or maroon localized area of discolored intact skin or blood-filled blister due to damage of underlying soft tissue from pressure and/or shear.  Wound Description (Comments): Multiple 2" DTI on vertebrae  Present on Admission: Yes     Pressure Injury 09/09/18 Back Lateral;Left;Upper Deep Tissue Injury - Purple or maroon localized area of discolored intact skin or blood-filled blister due to damage of underlying soft tissue from pressure and/or shear. (Active)  09/09/18 0530  Location: Back  Location Orientation: Lateral;Left;Upper  Staging: Deep Tissue Injury - Purple or maroon localized area of discolored intact skin or blood-filled blister due to damage of underlying soft tissue from pressure and/or shear.  Wound Description (Comments):   Present on Admission: Yes     Consultants:     Procedures:     Antimicrobials:       Subjective: Patient very weak and deconditioned, poorly responsive, not in pain or dyspnea.   Objective: Vitals:   09/09/18 0430 09/09/18 0530 09/09/18 0720 09/09/18 0800  BP: 102/73   91/63  Pulse: (!) 110 (!) 110  (!) 109  Resp: (!) 21 17  14   Temp: (!) 97.3 F (36.3 C) (!) 96.9 F (36.1 C) (!) 96.8 F (36 C)   TempSrc:  Axillary Axillary   SpO2: 96% 95%  100%   Weight:  54 kg    Height:  5\' 7"  (1.702 m)      Intake/Output Summary (Last 24 hours) at 09/09/2018 0843 Last data filed at 09/09/2018 0823 Gross per 24 hour  Intake 2335.33 ml  Output 190 ml  Net 2145.33 ml   Filed Weights   09/09/18 0159 09/09/18 0530  Weight: 45.4 kg 54 kg    Examination:   General: deconditioned and ill looking appearing  Neurology: Awake and alert, non focal, positive weakness proximal and distal 3/4 generalized.   E ENT: positive pallor, no icterus, oral mucosa dry Cardiovascular: No JVD. S1-S2 present, rhythmic, no gallops, rubs, or murmurs. No lower extremity edema. Pulmonary: positive breath sounds bilaterally, adequate air movement, no wheezing, rhonchi or rales. Gastrointestinal. Abdomen flat, no organomegaly, non tender, no rebound or guarding Skin. No rashes Musculoskeletal: no joint deformities     Data Reviewed: I have personally reviewed following labs and imaging studies  CBC: Recent Labs  Lab 09/09/18 0110 09/09/18 0634  WBC 13.2* 13.0*  NEUTROABS 9.7* 9.4*  HGB 11.3* 10.9*  HCT 35.9* 35.0*  MCV 96.2 96.7  PLT 219 164   Basic Metabolic Panel: Recent Labs  Lab 09/09/18 0110 09/09/18 0634  NA 147* 146*  K 5.3*  5.0  CL 112* 107  CO2 17* 19*  GLUCOSE 176* 171*  BUN 111* 131*  CREATININE 4.99* 4.76*  CALCIUM 8.9 7.6*   GFR: Estimated Creatinine Clearance: 9 mL/min (A) (by C-G formula based on SCr of 4.76 mg/dL (H)). Liver Function Tests: Recent Labs  Lab 09/09/18 0110 09/09/18 0249 09/09/18 0634  AST 26  --  26  ALT 18  --  15  ALKPHOS 148*  --  125  BILITOT 2.2* 2.0* 1.7*  PROT 7.1  --  6.5  ALBUMIN 2.8*  --  2.4*   No results for input(s): LIPASE, AMYLASE in the last 168 hours. No results for input(s): AMMONIA in the last 168 hours. Coagulation Profile: No results for input(s): INR, PROTIME in the last 168 hours. Cardiac Enzymes: No results for input(s): CKTOTAL, CKMB, CKMBINDEX, TROPONINI in the last 168  hours. BNP (last 3 results) No results for input(s): PROBNP in the last 8760 hours. HbA1C: No results for input(s): HGBA1C in the last 72 hours. CBG: Recent Labs  Lab 09/09/18 0106 09/09/18 0727  GLUCAP 163* 153*   Lipid Profile: No results for input(s): CHOL, HDL, LDLCALC, TRIG, CHOLHDL, LDLDIRECT in the last 72 hours. Thyroid Function Tests: No results for input(s): TSH, T4TOTAL, FREET4, T3FREE, THYROIDAB in the last 72 hours. Anemia Panel: No results for input(s): VITAMINB12, FOLATE, FERRITIN, TIBC, IRON, RETICCTPCT in the last 72 hours.    Radiology Studies: I have reviewed all of the imaging during this hospital visit personally     Scheduled Meds: . aerochamber Z-Stat Plus/medium      . aspirin EC  81 mg Oral QODAY  . Chlorhexidine Gluconate Cloth  6 each Topical Q0600  . divalproex  125 mg Oral BID  . escitalopram  5 mg Oral Daily  . feeding supplement (GLUCERNA SHAKE)  237 mL Oral TID BM  . heparin  5,000 Units Subcutaneous Q8H  . mouth rinse  15 mL Mouth Rinse BID  . mirtazapine  7.5 mg Oral QHS  . pantoprazole  40 mg Oral BID  . rosuvastatin  40 mg Oral QPM  . sodium chloride flush  3 mL Intravenous Q12H  . tamsulosin  0.4 mg Oral Daily  . vancomycin variable dose per unstable renal function (pharmacist dosing)   Does not apply See admin instructions   Continuous Infusions: . sodium chloride 90 mL/hr at 09/09/18 0823  . [START ON 09/10/2018] ceFEPime (MAXIPIME) IV    . metronidazole       LOS: 0 days        Dyanara Cozza Annett Gula, MD

## 2018-09-10 ENCOUNTER — Inpatient Hospital Stay (HOSPITAL_COMMUNITY): Payer: Medicare HMO

## 2018-09-10 DIAGNOSIS — L89600 Pressure ulcer of unspecified heel, unstageable: Secondary | ICD-10-CM

## 2018-09-10 DIAGNOSIS — E43 Unspecified severe protein-calorie malnutrition: Secondary | ICD-10-CM | POA: Insufficient documentation

## 2018-09-10 DIAGNOSIS — R0600 Dyspnea, unspecified: Secondary | ICD-10-CM

## 2018-09-10 DIAGNOSIS — Z7189 Other specified counseling: Secondary | ICD-10-CM

## 2018-09-10 LAB — ECHOCARDIOGRAM COMPLETE
Height: 67 in
Weight: 2010.6 oz

## 2018-09-10 LAB — CBC WITH DIFFERENTIAL/PLATELET
Abs Immature Granulocytes: 0.06 10*3/uL (ref 0.00–0.07)
Basophils Absolute: 0 10*3/uL (ref 0.0–0.1)
Basophils Relative: 0 %
Eosinophils Absolute: 0 10*3/uL (ref 0.0–0.5)
Eosinophils Relative: 0 %
HCT: 29.4 % — ABNORMAL LOW (ref 39.0–52.0)
Hemoglobin: 9.3 g/dL — ABNORMAL LOW (ref 13.0–17.0)
Immature Granulocytes: 1 %
Lymphocytes Relative: 13 %
Lymphs Abs: 1.4 10*3/uL (ref 0.7–4.0)
MCH: 30.6 pg (ref 26.0–34.0)
MCHC: 31.6 g/dL (ref 30.0–36.0)
MCV: 96.7 fL (ref 80.0–100.0)
Monocytes Absolute: 0.7 10*3/uL (ref 0.1–1.0)
Monocytes Relative: 7 %
Neutro Abs: 8.2 10*3/uL — ABNORMAL HIGH (ref 1.7–7.7)
Neutrophils Relative %: 79 %
Platelets: 163 10*3/uL (ref 150–400)
RBC: 3.04 MIL/uL — ABNORMAL LOW (ref 4.22–5.81)
RDW: 15.7 % — ABNORMAL HIGH (ref 11.5–15.5)
WBC: 10.4 10*3/uL (ref 4.0–10.5)
nRBC: 0 % (ref 0.0–0.2)

## 2018-09-10 LAB — BLOOD CULTURE ID PANEL (REFLEXED)

## 2018-09-10 LAB — BASIC METABOLIC PANEL
Anion gap: 10 (ref 5–15)
BUN: 126 mg/dL — ABNORMAL HIGH (ref 8–23)
CO2: 19 mmol/L — ABNORMAL LOW (ref 22–32)
Calcium: 8.2 mg/dL — ABNORMAL LOW (ref 8.9–10.3)
Chloride: 119 mmol/L — ABNORMAL HIGH (ref 98–111)
Creatinine, Ser: 4.57 mg/dL — ABNORMAL HIGH (ref 0.61–1.24)
GFR calc Af Amer: 13 mL/min — ABNORMAL LOW (ref >60)
GFR calc non Af Amer: 11 mL/min — ABNORMAL LOW (ref >60)
Glucose, Bld: 170 mg/dL — ABNORMAL HIGH (ref 70–99)
Potassium: 3.8 mmol/L (ref 3.5–5.1)
Sodium: 148 mmol/L — ABNORMAL HIGH (ref 135–145)

## 2018-09-10 LAB — URINE CULTURE: Culture: NO GROWTH

## 2018-09-10 LAB — UREA NITROGEN, URINE: Urea Nitrogen, Ur: 677 mg/dL

## 2018-09-10 LAB — GLUCOSE, CAPILLARY: Glucose-Capillary: 158 mg/dL — ABNORMAL HIGH (ref 70–99)

## 2018-09-10 MED ORDER — HYDROMORPHONE HCL 1 MG/ML IJ SOLN: 0.2000 mg | INTRAMUSCULAR | Status: DC | PRN

## 2018-09-10 MED ORDER — LORAZEPAM 2 MG/ML IJ SOLN: 0.5000 mg | INTRAMUSCULAR | Status: DC | PRN

## 2018-09-10 MED ORDER — SODIUM BICARBONATE 8.4 % IV SOLN
INTRAVENOUS | Status: DC
  Administered 2018-09-10 (×2): via INTRAVENOUS
  Filled 2018-09-10: qty 150
  Filled 2018-09-10: qty 50
  Filled 2018-09-10 (×3): qty 150

## 2018-09-10 MED ORDER — NOREPINEPHRINE 4 MG/250ML-% IV SOLN
0.0000 ug/min | INTRAVENOUS | Status: DC
  Administered 2018-09-10 – 2018-09-11 (×2): 2 ug/min via INTRAVENOUS
  Filled 2018-09-10 (×2): qty 250

## 2018-09-10 MED ORDER — SODIUM CHLORIDE 0.9 % IV BOLUS
500.0000 mL | Freq: Once | INTRAVENOUS | Status: AC
  Administered 2018-09-10: 11:00:00 500 mL via INTRAVENOUS

## 2018-09-10 MED ORDER — CHLORHEXIDINE GLUCONATE CLOTH 2 % EX PADS
6.0000 | MEDICATED_PAD | Freq: Every day | CUTANEOUS | Status: DC
  Administered 2018-09-10 – 2018-09-14 (×5): 6 via TOPICAL

## 2018-09-10 MED ORDER — CEFAZOLIN SODIUM-DEXTROSE 1-4 GM/50ML-% IV SOLN
1.0000 g | Freq: Three times a day (TID) | INTRAVENOUS | Status: DC
  Filled 2018-09-10: qty 50

## 2018-09-10 MED ORDER — CEFAZOLIN SODIUM-DEXTROSE 1-4 GM/50ML-% IV SOLN
1.0000 g | INTRAVENOUS | Status: AC
  Administered 2018-09-10: 12:00:00 1 g via INTRAVENOUS
  Filled 2018-09-10: qty 50

## 2018-09-10 MED ORDER — HALOPERIDOL LACTATE 5 MG/ML IJ SOLN: 1.0000 mg | Freq: Four times a day (QID) | INTRAMUSCULAR | Status: DC | PRN

## 2018-09-10 MED ORDER — LACTATED RINGERS IV BOLUS
500.0000 mL | Freq: Once | INTRAVENOUS | Status: AC
  Administered 2018-09-10: 500 mL via INTRAVENOUS

## 2018-09-10 MED ORDER — GLYCOPYRROLATE 0.2 MG/ML IJ SOLN
0.2000 mg | INTRAMUSCULAR | Status: DC | PRN
  Filled 2018-09-10: qty 1

## 2018-09-10 MED ORDER — CEFAZOLIN SODIUM-DEXTROSE 1-4 GM/50ML-% IV SOLN: 1.0000 g | Freq: Two times a day (BID) | INTRAVENOUS | Status: DC

## 2018-09-10 MED ORDER — HYDROCORTISONE NA SUCCINATE PF 100 MG IJ SOLR
100.0000 mg | Freq: Three times a day (TID) | INTRAMUSCULAR | Status: DC
  Administered 2018-09-10 – 2018-09-15 (×16): 100 mg via INTRAVENOUS
  Filled 2018-09-10 (×16): qty 2

## 2018-09-10 MED ORDER — NOREPINEPHRINE BITARTRATE 1 MG/ML IV SOLN
0.0000 ug/min | INTRAVENOUS | Status: DC
  Filled 2018-09-10: qty 4

## 2018-09-10 MED ORDER — CEFAZOLIN SODIUM-DEXTROSE 1-4 GM/50ML-% IV SOLN
1.0000 g | INTRAVENOUS | Status: DC
  Administered 2018-09-10 – 2018-09-12 (×3): 1 g via INTRAVENOUS
  Filled 2018-09-10 (×4): qty 50

## 2018-09-10 NOTE — Progress Notes (Signed)
Pharmacy Antibiotic Note  [name] is a 85 y.o. male admitted on 09/09/2018 with bacteremia.  Pharmacy has been consulted for cefazolin dosing. He has h/o AVR.   He had MSSE bacteremia on Apr 18, 2018 and had TEE that revealed no vegetations. He completed 2 wks of cefazolin on 05/02/2018.  He has Bcx growing Coag neg staph from 6/17 Bcx (currently in 1/2 sets). Vasopressors bein started.  AKI noted with little UOP  Plan:  Cefazolin 1gm x 1 was given,  Based on current CrCl and UOP will start cefazolin 1gm IV q24h now (to provide a 2gm initial dose_  Monitor renal function and adjust dose as indicated  Call microbiology to request speciation of the Coag neg staph (and sensitivities)  Height: 5\' 7"  (170.2 cm) Weight: 125 lb 10.6 oz (57 kg) IBW/kg (Calculated) : 66.1  Temp (24hrs), Avg:97.5 F (36.4 C), Min:94.5 F (34.7 C), Max:99.3 F (37.4 C)  Recent Labs  Lab 09/09/18 0110 09/09/18 0310 09/09/18 0634 09/09/18 1709 09/10/18 0256  WBC 13.2*  --  13.0*  --  10.4  CREATININE 4.99*  --  4.76* 4.73* 4.57*  LATICACIDVEN 2.6* 1.6 2.7*  --   --     Estimated Creatinine Clearance: 9.9 mL/min (A) (by C-G formula based on SCr of 4.57 mg/dL (H)).    No Known Allergies  Antimicrobials this admission: 6/17 Cefepime>>6/18 6/17 vanc>> 6/17 6/17 Flagyl >>6/18 6/18 cefazolin >>  Dose adjustments this admission:   Microbiology results: 6/17 BCx: GPC 1/2 Sets (BCID - methicillin-susc coag neg staph) 6/17 UCx: NG  6/17 MRSA PCR: neg  Thank you for allowing pharmacy to be a part of this patient's care.  Doreene Eland, PharmD, BCPS.   Work Cell: [phone] 09/10/2018 12:39 PM

## 2018-09-10 NOTE — Progress Notes (Signed)
Assisted tele visit to patient with family member.  Sharice Harriss McEachran, RN  

## 2018-09-10 NOTE — Progress Notes (Addendum)
Patient with persistent hypotension despite IV fluids, new  worsening mentation.  Will add low-dose norepinephrine, and stress dose steroids.  1 of 3 of his blood cultures came back positive for Staphylococcus coag negative,, will start patient on cefazolin IV.   Patient does have a very poor prognosis and high risk of rapid deterioration.  If no response to medical therapy will allow her daughter to come into the hospital to be with him, imminent death.

## 2018-09-10 NOTE — Progress Notes (Signed)
  Echocardiogram 2D Echocardiogram has been performed.  Jannett Celestine 09/10/2018, 11:10 AM

## 2018-09-10 NOTE — Progress Notes (Signed)
  Speech Language Pathology Treatment: Dysphagia  Patient Details Name: [name] MRN: [id number] DOB: [birth year] Today's Date: 09/10/2018 Time: 6503-5465 SLP Time Calculation (min) (ACUTE ONLY): 46 min  Assessment / Plan / Recommendation Clinical Impression  Pt with xerostomic oral cavity without open mouth breathing.  SLP provided oral care with toothette and oral suction which resulted in delayed cough - nonproductive congested cough.  Cues to cough were ineffective.   SLP provided trials of po including ice chips, icecream, jello, water including having pt help hand over hand.  He elicited swallow x2 of approximately 12 trials, remainder of boluses retained in anterior oral cavity with bilabial loss of liquids.   SLP orally suctioned patient multiple times during evaluation.     Per SNF chart review, pt has h/o behavioral disturbances including crying, hitting and wanting to sit on the floor that prompted a care plan to be started Jul 24, 2018.   Per focus in care plan from SNF, pt had been "exhibited adverse mood symptoms feeling down, tearfull, sadness" 07/28/2018 and "sadness from his spouse's death" and appears from orders that pt was admitted July 21, 2018 causing SlP to suspect a severe depression component to his poor intake.  Pt did verbalize "I don't know" when inquired why he is not swallowing.  Minimal oral opening despite verbal cues and stimulation- Pt will only open with cues to say "ah" and this is minimal.    Anticipate given pt recent h/o that his dysphagia is due to combination of deconditioning from malnutrition that possibly initiated with this pt's situational sadness.  Pt will not consume po intake regardless of diet and is very high risk.  Highly recommend a palliative consult given pt's dementia and gross malnutrition.  Placement of short term feeding tube will likely not prevent aspiraiton nor change pt's outcomes and cause him significant discomfort.    HPI HPI: Patient  is an 85 y.o. male with PMH: dementia, CAD, chronic systolic CHF, KC-1,E chronic kidney disease state III who presented from his SNF after being found unresponsive. SNF also reporting that he had been complaining of not feeling well and was not eating much of anything in past few days. In ED, patient was hypotensive. Primary diagnosis for this admission is sepsis.      SLP Plan  Continue with current plan of care;Other (Comment)(will follow up if [place] meeting initiated)       Recommendations  Diet recommendations: NPO(oral care at least QID, tsps water and ice chips prn - orally suction if pt does not swallow) Medication Administration: Via alternative means Supervision: Full supervision/cueing for compensatory strategies Compensations: Small sips/bites(oral suction if pt does not swallow)                Oral Care Recommendations: Oral care before and after PO;Oral care prior to ice chip/H20 Follow up Recommendations: None SLP Visit Diagnosis: Dysphagia, oropharyngeal phase (R13.12) Plan: Continue with current plan of care;Other (Comment)(will follow up if [place] meeting initiated)       GO                Macario Golds 09/10/2018, 9:58 AM  Luanna Salk, [place] SLP Acute Rehab Services Pager [phone] Office [phone]

## 2018-09-10 NOTE — Progress Notes (Signed)
PHARMACY - PHYSICIAN COMMUNICATION CRITICAL VALUE ALERT - BLOOD CULTURE IDENTIFICATION (BCID)  [name] is an 85 y.o. male who presented to [facility] on 09/09/2018 with a chief complaint of AMS  Assessment:  BCID + 1/3 GPC, staphylococcus species, mecA NOT detected  Name of physician (or Provider) Contacted: Dr. Cathlean Sauer  Current antibiotics: None  Changes to prescribed antibiotics recommended: No change at this time. MD suspects contaminant.   Results for orders placed or performed during the hospital encounter of 09/09/18  Blood Culture ID Panel (Reflexed) (Collected: 09/09/2018  1:10 AM)  Result Value Ref Range   Enterococcus species NOT DETECTED NOT DETECTED   Listeria monocytogenes NOT DETECTED NOT DETECTED   Staphylococcus species DETECTED (A) NOT DETECTED   Staphylococcus aureus (BCID) NOT DETECTED NOT DETECTED   Methicillin resistance NOT DETECTED NOT DETECTED   Streptococcus species NOT DETECTED NOT DETECTED   Streptococcus agalactiae NOT DETECTED NOT DETECTED   Streptococcus pneumoniae NOT DETECTED NOT DETECTED   Streptococcus pyogenes NOT DETECTED NOT DETECTED   Acinetobacter baumannii NOT DETECTED NOT DETECTED   Enterobacteriaceae species NOT DETECTED NOT DETECTED   Enterobacter cloacae complex NOT DETECTED NOT DETECTED   Escherichia coli NOT DETECTED NOT DETECTED   Klebsiella oxytoca NOT DETECTED NOT DETECTED   Klebsiella pneumoniae NOT DETECTED NOT DETECTED   Proteus species NOT DETECTED NOT DETECTED   Serratia marcescens NOT DETECTED NOT DETECTED   Haemophilus influenzae NOT DETECTED NOT DETECTED   Neisseria meningitidis NOT DETECTED NOT DETECTED   Pseudomonas aeruginosa NOT DETECTED NOT DETECTED   Candida albicans NOT DETECTED NOT DETECTED   Candida glabrata NOT DETECTED NOT DETECTED   Candida krusei NOT DETECTED NOT DETECTED   Candida parapsilosis NOT DETECTED NOT DETECTED   Candida tropicalis NOT DETECTED NOT DETECTED    Lenis Noon, PharmD 09/10/2018   11:03 AM

## 2018-09-10 NOTE — Consult Note (Signed)
Consultation Note Date: 09/10/2018   Patient Name: [name]  DOB: 1932-10-14  MRN: [id number]  Age / Sex: 85 y.o., male  PCP: Chipper Herb, MD Referring Physician: Tawni Millers  Reason for Consultation: Establishing goals of care  HPI/Patient Profile: 85 y.o. male  with past medical history of dementia, CAD, CHF, type 2 DM, CKD admitted on 09/09/2018 with sepsis, oliguric AKI, hypotension, and worsening clinical status over the last 24 hours.  Family is at the bedside.  Palliative consulted for [place] and family support.   Clinical Assessment and Goals of Care: Palliative consult received.  Chart reviewed including personal review of pertinent labs and imaging.  Discussed with bedside RN, charge RN, and Dr. Cathlean Sauer  I saw and examined [name].  On initial encounter, he was not interactive.  I then saw him just prior to family meeting and he was alert and denied any pain, anxiety, or SOB.  I met today with multiple family members including 2 daughters, grandson, and granddaughter in Sports coach in conjunction with Agricultural consultant.  I introduced palliative care as specialized medical care for people living with serious illness. It focuses on providing relief from the symptoms and stress of a serious illness. The goal is to improve quality of life for both the patient and the family.  Family report that 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  His daughters report that he has been "going downhill" since his wife dies 3 years ago.  They have seen a significant decline over the past couple of weeks.  We discussed clinical course with continued decline and concern that we are approaching end of life regardless of interventions moving forward.  We discussed difference between a aggressive medical intervention path and a palliative, comfort focused care path.     Questions and concerns addressed.   PMT will continue to support holistically.  SUMMARY OF RECOMMENDATIONS   - DNR/DNI - Family understands that [name] is critically ill and likely transitioning to actively dying. As he is currently not in any distress and able to intermittently meaningfully communicate with them, they would like to continue current interventions, no escalation of care, and continue to assess his response.  They understand that he is likely to die and do not want him to suffer if he begins to suffer from symptoms such pain, anxiety, or SOB they want these treated aggresively.  I have placed medications to be used in case he develops symptoms.  Will follow-up tomorrow if he survives the night.  Code Status/Advance Care Planning:  DNR  Palliative Prophylaxis:   Frequent Pain Assessment  Psycho-social/Spiritual:   Desire for further Chaplaincy support:no  Additional Recommendations: Caregiving  Support/Resources  Prognosis:   Hours - Days  Discharge Planning: Anticipated Hospital Death      Primary Diagnoses: Present on Admission: . Severe sepsis ([place]) . Acute renal failure superimposed on stage 3 chronic kidney disease ([place]) . Dementia without behavioral disturbance ([place]) .  Chronic systolic CHF (congestive heart failure) ([place])   I have reviewed the medical record, interviewed the patient and family, and examined the patient. The following aspects are pertinent.  Past Medical History:  Diagnosis Date  . Anemia   . Aortic stenosis    s/p pericardial AVR  . Aortic valve disorder   . Arthritis   . Benign prostatic hypertrophy   . CAD (coronary artery disease) 01/30/2011  . Carotid bruit   . COLONIC POLYPS 06/23/2008   Qualifier: Diagnosis of  By: Mare Ferrari, RMA, Sherri    . Coronary artery disease    s/p CABG 08/2010    . Diabetes mellitus without complication ([place])   . Dyslipidemia 10/15/2017  . Essential hypertension 08/21/2016  . H/O aortic valve  replacement 08/21/2016  . Hyperlipidemia LDL goal <70 06/23/2008   Qualifier: Diagnosis of  By: Mare Ferrari, RMA, Sherri    . Orthostatic hypotension 06/22/2012  . Palpitations 08/21/2016  . Renal disorder    KIDNEY STONE  . Type 2 diabetes mellitus with hyperlipidemia ([place]) 06/23/2008   Qualifier: Diagnosis of  By: Mare Ferrari, RMA, Sherri    . Unstable angina ([place]) 07/24/2011   Social History   Socioeconomic History  . Marital status: Widowed    Spouse name: Not on file  . Number of children: 4  . Years of education: 56  . Highest education level: 11th grade  Occupational History  . Occupation: Retired  Scientific laboratory technician  . Financial resource strain: Not on file  . Food insecurity    Worry: Not on file    Inability: Not on file  . Transportation needs    Medical: Not on file    Non-medical: Not on file  Tobacco Use  . Smoking status: Never Smoker  . Smokeless tobacco: Never Used  Substance and Sexual Activity  . Alcohol use: Yes    Frequency: Never    Comment: occassional  . Drug use: No  . Sexual activity: Yes  Lifestyle  . Physical activity    Days per week: Not on file    Minutes per session: Not on file  . Stress: Not on file  Relationships  . Social Herbalist on phone: Not on file    Gets together: Not on file    Attends religious service: Not on file    Active member of club or organization: Not on file    Attends meetings of clubs or organizations: Not on file    Relationship status: Not on file  Other Topics Concern  . Not on file  Social History Narrative   ** Merged History Encounter **       Family History  Problem Relation Age of Onset  . Cancer Mother   . Diabetes Father   . Hypertension Other   . Diabetes Other    Scheduled Meds: . Chlorhexidine Gluconate Cloth  6 each Topical Q0600  . heparin  5,000 Units Subcutaneous Q8H  . hydrocortisone sod succinate (SOLU-CORTEF) inj  100 mg Intravenous Q8H  . mouth rinse  15 mL Mouth Rinse BID  . sodium  chloride flush  3 mL Intravenous Q12H   Continuous Infusions: .  ceFAZolin (ANCEF) IV Stopped (09/10/18 1359)  . norepinephrine (LEVOPHED) Adult infusion 2 mcg/min (09/10/18 1800)  .  sodium bicarbonate  infusion 1000 mL 75 mL/hr at 09/10/18 1800   PRN Meds:.acetaminophen **OR** acetaminophen, glycopyrrolate, haloperidol lactate, HYDROmorphone (DILAUDID) injection, LORazepam, ondansetron **OR** ondansetron (ZOFRAN) IV, polyethylene glycol Medications Prior to Admission:  Prior to Admission medications   Medication Sig Start Date End Date Taking? Authorizing Provider  acetaminophen (TYLENOL) 325 MG tablet Take 650 mg by mouth every 4 (four) hours as needed for mild pain.   Yes [provider]  acetaminophen (TYLENOL) 650 MG CR tablet Take 650 mg by mouth every 8 (eight) hours.   Yes [provider]  aspirin EC 81 MG tablet Take 1 tablet (81 mg total) by mouth every other day. 07/21/18 07/21/19 Yes Shah, Pratik D, DO  divalproex (DEPAKOTE) 125 MG DR tablet Take 125 mg by mouth 2 (two) times daily.   Yes [provider]  escitalopram (LEXAPRO) 5 MG tablet TAKE 1 TABLET ONCE DAILY IN THE EVENING Patient taking differently: Take 5 mg by mouth daily.  08/06/18  Yes Chipper Herb, MD  feeding supplement, GLUCERNA SHAKE, (GLUCERNA SHAKE) LIQD Take 237 mLs by mouth 3 (three) times daily between meals.   Yes [provider]  Infant Care Products (DERMACLOUD) CREA Apply 1 application topically 2 (two) times a day.   Yes [provider]  metFORMIN (GLUCOPHAGE) 500 MG tablet Take 1 tablet (500 mg total) by mouth 2 (two) times daily. 12/25/17  Yes Chipper Herb, MD  mirtazapine (REMERON) 7.5 MG tablet Take 7.5 mg by mouth at bedtime.   Yes [provider]  nitroGLYCERIN (NITROSTAT) 0.4 MG SL tablet Place 1 tablet (0.4 mg total) under the tongue every 5 (five) minutes x 3 doses as needed for chest pain. 08/21/16  Yes Hochrein, Jeneen Rinks, MD  pantoprazole  (PROTONIX) 40 MG tablet TAKE (1) TABLET TWICE A DAY BEFOR A MEAL Patient taking differently: Take 40 mg by mouth 2 (two) times daily.  08/06/18  Yes Chipper Herb, MD  polyethylene glycol [facility] / Floria Raveling) packet Take 17 g by mouth daily. 02/06/18  Yes Chipper Herb, MD  rosuvastatin (CRESTOR) 40 MG tablet Take 1 tablet (40 mg total) by mouth every evening. 12/25/17 09/09/18 Yes Chipper Herb, MD  tamsulosin (FLOMAX) 0.4 MG CAPS capsule TAKE (1) CAPSULE DAILY Patient taking differently: Take 0.4 mg by mouth daily.  06/26/18  Yes Gottschalk, Leatrice Jewels M, DO  ACCU-CHEK SOFTCLIX LANCETS lancets Use as instructed 09/15/15   Chipper Herb, MD  furosemide (LASIX) 20 MG tablet TAKE 2 TABLETS ON MONDAY, [place]. TAKE 1 TABLET ALL OTHERDAYS. Patient not taking: Reported on 09/09/2018 08/06/18   Chipper Herb, MD  glucose blood test strip CHECK BLOOD SUGAR UP TO 4 TIMES A DAY 03/31/18   Chipper Herb, MD  risperiDONE (RISPERDAL) 0.25 MG tablet TAKE  (1)  TABLET  THREE TIMES DAILY. Patient not taking: Reported on 09/09/2018 08/26/18   Chipper Herb, MD   No Known Allergies Review of Systems Unable to obtain  Physical Exam  General: Awake but only intermittently about to interact, in no acute distress. Frail and ill appearing. Lungs: Decreased air movement Abdomen: Soft, nontender, nondistended, positive bowel sounds.  Ext: No significant edema. + muscle wasting Skin: Warm and dry  Vital Signs: BP 99/64   Pulse 90   Temp (!) 96.7 F (35.9 C) (Axillary)   Resp (!) 23   Ht '5\' 7"'  (1.702 m)   Wt 57 kg   SpO2 100%   BMI 19.68 kg/m  Pain Scale: CPOT     SpO2: SpO2: 100 % O2 Device:SpO2: 100 % O2 Flow Rate: .O2 Flow Rate (L/min): 2 L/min  IO: Intake/output summary:   Intake/Output Summary (Last 24  hours) at 09/10/2018 1957 Last data filed at 09/10/2018 1800 Gross per 24 hour  Intake 1683.1 ml  Output 115 ml  Net 1568.1 ml    LBM: Last BM Date: (UTA) Baseline Weight:  Weight: 45.4 kg Most recent weight: Weight: 57 kg     Palliative Assessment/Data:     Time In: 1640 Time Out: 1800 Time Total: 80 Greater than 50%  of this time was spent counseling and coordinating care related to the above assessment and plan.  Signed by: Micheline Rough, MD   Please contact Palliative Medicine Team phone at [phone] for questions and concerns.  For individual provider: See Shea Evans

## 2018-09-10 NOTE — Progress Notes (Signed)
Initial Nutrition Assessment  DOCUMENTATION CODES:   Severe malnutrition in context of chronic illness  INTERVENTION:  - will continue to monitor POC/GOC.   NUTRITION DIAGNOSIS:   Severe Malnutrition related to chronic illness as evidenced by moderate fat depletion, severe fat depletion, moderate muscle depletion, severe muscle depletion.  GOAL:   Patient will meet greater than or equal to 90% of their needs, Weight gain  MONITOR:   Diet advancement, Labs, Weight trends, Skin  REASON FOR ASSESSMENT:   Consult Assessment of nutrition requirement/status  ASSESSMENT:   85 year old male who presented with AMS. He has past medical history of dementia, CHF, type 2 DM, and stage 3 CKD.  He is from SNF and apparently he complained of not feeling well in general for last several days, decreased p.o. intake. He was found unresponsive and hypotensive, EMS brought him to the hospital. On admission he was lethargic, opening his eyes spontaneously and answering only to yes/no questions intermittently. Patient was admitted to the hospital with a working diagnosis of sepsis.  Patient seen this AM with RN at bedside who reports that patient has been non-verbal this AM and has not been following commands. SLP in the room and preparing to perform swallow evaluation. Reviewed SLP following eval and recommendation was made to continue NPO status at this time. Unable to obtain any nutrition-related information this AM.   Per chart review, current weight is 126 lb and weight on 07/08/18 was 156 lb. This indicates 30 lb weight loss (19% body weight) in the past 2 months; significant for time frame. Weight had been stable (153-158 lb) from 05/04/18-07/19/18.  Per notes: sepsis ruled out, oliguric AKI on CKD stage 3, metabolic acidosis, metabolic encephalopathy on top of hx of dementia, severe PCM with possibility of placing NGT and starting TF.     Medications reviewed. Labs reviewed; CBGs: 163, 153, and  158 mg/dl today, Na: 148 mmol/l, Cl: 119 mmol/l, BUN: 126 mg/dl, creatinine: 4.57 mg/dl, Ca: 8.2 mg/dl, GFR: 11 ml/min.  IVF; D5-150 mEq sodium bicarb @ 75 ml/hr (306 kcal).       NUTRITION - FOCUSED PHYSICAL EXAM:    Most Recent Value  Orbital Region  Moderate depletion  Upper Arm Region  Severe depletion  Thoracic and Lumbar Region  Unable to assess  Buccal Region  Severe depletion  Temple Region  Moderate depletion  Clavicle Bone Region  Severe depletion  Clavicle and Acromion Bone Region  Severe depletion  Scapular Bone Region  Unable to assess  Dorsal Hand  Moderate depletion  Patellar Region  Moderate depletion  Anterior Thigh Region  Unable to assess  Posterior Calf Region  Moderate depletion  Edema (RD Assessment)  None  Hair  Reviewed  Eyes  Reviewed  Mouth  Unable to assess  Skin  Reviewed  Nails  Reviewed       Diet Order:   Diet Order            Diet NPO time specified  Diet effective now              EDUCATION NEEDS:   Not appropriate for education at this time  Skin:  Skin Assessment: Skin Integrity Issues: Skin Integrity Issues:: DTI, Unstageable DTI: vertebral column Unstageable: full thickness to coccyx  Last BM:  6/18  Height:   Ht Readings from Last 1 Encounters:  09/09/18 5\' 7"  (1.702 m)    Weight:   Wt Readings from Last 1 Encounters:  09/10/18 57 kg  Ideal Body Weight:  67.3 kg  BMI:  Body mass index is 19.68 kg/m.  Estimated Nutritional Needs:   Kcal:  3267-1245 kcal  Protein:  100-115 grams  Fluid:  >/= 2 L/day     Jarome Matin, MS, RD, LDN, [facility] Inpatient Clinical Dietitian Pager # [phone] After hours/weekend pager # [phone]

## 2018-09-10 NOTE — Progress Notes (Signed)
PROGRESS NOTE    [name]  ONG:[id number] DOB: [birth year] DOA: 09/09/2018 PCP: [name], MD    Brief Narrative:  85 year old male who presented with altered mentation.  He does have significant past medical history for dementia, systolic heart failure, type 2 diabetes mellitus and stage III chronic kidney disease.  He was found unresponsive at skilled nursing facility, apparently he complained of not feeling well in general for last several days, decreased p.o. intake.  He was found unresponsive and hypotensive, EMS brought him to the hospital.  On his initial physical examination his blood pressure was 96/66, heart rate 115, respiratory 23, temperature 36 C, oxygen saturation 100% he was cachectic, moist mucous membranes, coarse breath sounds bilaterally, heart S1-S2 present rhythm, the abdomen was soft nontender, no lower extremity edema, he was lethargic, opening his eyes spontaneously answering only to yes/no questions intermittently.  Sodium 147, potassium 5.3, chloride 112, bicarb 17, glucose 176, BUN 111, creatinine 4.9, AST 26, ALT 18, lactic acid 2.6 (venous), white count 13.2, hemoglobin 11.3, hematocrit 35.9, platelets 219.  SARS COVID-19 was negative.  Urinalysis negative for infection.  His chest radiograph had left rotation, no infiltrates.  EKG 122 bpm, with left axis deviation, sinus rhythm, left anterior fascicular block, normal intervals, poor R wave progression, no ST segment or T wave changes.  Patient was admitted to the hospital with a working diagnosis of sepsis.    Assessment & Plan:   Principal Problem:   Severe sepsis (HCC) Active Problems:   Hx of CABG and AVR   Dementia without behavioral disturbance (HCC)   Acute renal failure superimposed on stage 3 chronic kidney disease (HCC)   Chronic systolic CHF (congestive heart failure) (HCC)   Pressure injury of skin   1. Suspected sepsis. No signs of systemic infection, symptoms likely related to uremia and  hypovolemia. He has remained afebrile, no leukocytosis, repeat chest film with no infiltrates (personally reviewed), will dc antibiotic therapy for now. Sepsis ruled out.   2. Oliguric AKI on CKD stage 3 with uremia/ anion gap metabolic acidosis/ hyperkalemia. Very poor oral intake for the last 2 weeks before admission. Persistent oliguria with urine output over last 24 H at 250 ml, increased from yesterday 190 ml. His cr is persistently elevation at 4,57 but lower than yesterday at 4.73. K today is 3,8 and serum bicarbonate at 19 (anion gap 10) with a BUN 126 from 133. No frank volume overloaded, received a bolus 500 cc this am for episode of hypotension. Will continue IV fluids with a bicarb drip at 75 ml per H. Continue to monitor urine output, avoid hypotension and nephrotoxic medications. Check echocardiogram.   3. Metabolic encephalopathy. Seems to be more awake than yesterday, but continue to have very poor reaction to stimuli, verbal and touch. BUN at 126. Will continue neuro checks and aspiration precautions. He has been NPO per speech therapy recommendations.   4.Severe calorie protein malnutrition. I spoke with nursing at SNF, apparently patient with no dementia but with very flat affect, suspected severe depression. If persistent aspiration risk will consider tube feeding per NG while critically ill. Check P. Patient in high risk for refeeding syndrome.     5. Pressure ulcers. Present on admission. Unable to stage lesions back, coccyx and heals. Follow wound care recommendations.    DVT prophylaxis: heparin   Code Status: DNR Family Communication: no family at beside Disposition Plan/ discharge barriers: pending clinical improvement.   Body mass index is 19.68 kg/m.  Malnutrition Type:      Malnutrition Characteristics:      Nutrition Interventions:     RN Pressure Injury Documentation: Pressure Injury 09/09/18 Coccyx Mid Unstageable - Full thickness tissue loss in  which the base of the ulcer is covered by slough (yellow, tan, gray, green or brown) and/or eschar (tan, brown or black) in the wound bed. (Active)  09/09/18 0530  Location: Coccyx  Location Orientation: Mid  Staging: Unstageable - Full thickness tissue loss in which the base of the ulcer is covered by slough (yellow, tan, gray, green or brown) and/or eschar (tan, brown or black) in the wound bed.  Wound Description (Comments):   Present on Admission: Yes     Pressure Injury 09/09/18 Vertebral column Mid Deep Tissue Injury - Purple or maroon localized area of discolored intact skin or blood-filled blister due to damage of underlying soft tissue from pressure and/or shear. Multiple 2" DTI on vertebrae (Active)  09/09/18 0530  Location: Vertebral column  Location Orientation: Mid  Staging: Deep Tissue Injury - Purple or maroon localized area of discolored intact skin or blood-filled blister due to damage of underlying soft tissue from pressure and/or shear.  Wound Description (Comments): Multiple 2" DTI on vertebrae  Present on Admission: Yes     Pressure Injury 09/09/18 Back Lateral;Left;Upper Deep Tissue Injury - Purple or maroon localized area of discolored intact skin or blood-filled blister due to damage of underlying soft tissue from pressure and/or shear. (Active)  09/09/18 0530  Location: Back  Location Orientation: Lateral;Left;Upper  Staging: Deep Tissue Injury - Purple or maroon localized area of discolored intact skin or blood-filled blister due to damage of underlying soft tissue from pressure and/or shear.  Wound Description (Comments):   Present on Admission: Yes     Consultants:    Procedures:     Antimicrobials:   Dc vancomycin 06/17  Dc cefepime 06/18.    Subjective: Patient continue to be minimally reactive, had hypotension this am, not verbal but abel to follow commands, he does not seem to be in pain or dyspnea.   Objective: Vitals:   09/10/18 0339  09/10/18 0400 09/10/18 0500 09/10/18 0605  BP:  92/68 92/63 (!) 81/58  Pulse:  (!) 106 (!) 110   Resp:  20 (!) 24   Temp: 97.6 F (36.4 C)     TempSrc: Axillary     SpO2:  100% 100%   Weight:   57 kg   Height:        Intake/Output Summary (Last 24 hours) at 09/10/2018 0754 Last data filed at 09/10/2018 0600 Gross per 24 hour  Intake 1877.94 ml  Output 250 ml  Net 1627.94 ml   Filed Weights   09/09/18 0159 09/09/18 0530 09/10/18 0500  Weight: 45.4 kg 54 kg 57 kg    Examination:   General: very deconditioned and ill looking appearing  Neurology: Somnolent but easy to arouse, non focal  E ENT: mild pallor, no icterus, oral mucosa dry.  Cardiovascular: No JVD. S1-S2 present, rhythmic, no gallops, rubs, or murmurs. Trace lower extremity edema more left than right Pulmonary: positive breath sounds bilaterally, decreased air movement, no wheezing, or rhonchi, scattered rales. Gastrointestinal. Abdomen escaphoid no organomegaly, non tender, no rebound or guarding Skin. No rashes Musculoskeletal: no joint deformities     Data Reviewed: I have personally reviewed following labs and imaging studies  CBC: Recent Labs  Lab 09/09/18 0110 09/09/18 0634 09/10/18 0256  WBC 13.2* 13.0* 10.4  NEUTROABS 9.7* 9.4*  8.2*  HGB 11.3* 10.9* 9.3*  HCT 35.9* 35.0* 29.4*  MCV 96.2 96.7 96.7  PLT 219 164 163   Basic Metabolic Panel: Recent Labs  Lab 09/09/18 0110 09/09/18 0634 09/09/18 1709 09/10/18 0256  NA 147* 146* 148* 148*  K 5.3* 5.0 4.2 3.8  CL 112* 107 114* 119*  CO2 17* 19* 17* 19*  GLUCOSE 176* 171* 209* 170*  BUN 111* 131* 133* 126*  CREATININE 4.99* 4.76* 4.73* 4.57*  CALCIUM 8.9 7.6* 8.2* 8.2*   GFR: Estimated Creatinine Clearance: 9.9 mL/min (A) (by C-G formula based on SCr of 4.57 mg/dL (H)). Liver Function Tests: Recent Labs  Lab 09/09/18 0110 09/09/18 0249 09/09/18 0634  AST 26  --  26  ALT 18  --  15  ALKPHOS 148*  --  125  BILITOT 2.2* 2.0* 1.7*   PROT 7.1  --  6.5  ALBUMIN 2.8*  --  2.4*   No results for input(s): LIPASE, AMYLASE in the last 168 hours. No results for input(s): AMMONIA in the last 168 hours. Coagulation Profile: No results for input(s): INR, PROTIME in the last 168 hours. Cardiac Enzymes: No results for input(s): CKTOTAL, CKMB, CKMBINDEX, TROPONINI in the last 168 hours. BNP (last 3 results) No results for input(s): PROBNP in the last 8760 hours. HbA1C: No results for input(s): HGBA1C in the last 72 hours. CBG: Recent Labs  Lab 09/09/18 0106 09/09/18 0727 09/10/18 0726  GLUCAP 163* 153* 158*   Lipid Profile: No results for input(s): CHOL, HDL, LDLCALC, TRIG, CHOLHDL, LDLDIRECT in the last 72 hours. Thyroid Function Tests: No results for input(s): TSH, T4TOTAL, FREET4, T3FREE, THYROIDAB in the last 72 hours. Anemia Panel: No results for input(s): VITAMINB12, FOLATE, FERRITIN, TIBC, IRON, RETICCTPCT in the last 72 hours.    Radiology Studies: I have reviewed all of the imaging during this hospital visit personally     Scheduled Meds: . Chlorhexidine Gluconate Cloth  6 each Topical Q0600  . feeding supplement (GLUCERNA SHAKE)  237 mL Oral TID BM  . heparin  5,000 Units Subcutaneous Q8H  . mouth rinse  15 mL Mouth Rinse BID  . sodium chloride flush  3 mL Intravenous Q12H   Continuous Infusions: . ceFEPime (MAXIPIME) IV Stopped (09/10/18 0304)  . dextrose 5 % and 0.45% NaCl 50 mL/hr at 09/09/18 1723     LOS: 1 day        [place] Hair Annett Gula, MD

## 2018-09-11 ENCOUNTER — Inpatient Hospital Stay (HOSPITAL_COMMUNITY): Payer: Medicare HMO

## 2018-09-11 ENCOUNTER — Other Ambulatory Visit: Payer: Self-pay

## 2018-09-11 DIAGNOSIS — R609 Edema, unspecified: Secondary | ICD-10-CM

## 2018-09-11 DIAGNOSIS — R6521 Severe sepsis with septic shock: Secondary | ICD-10-CM

## 2018-09-11 DIAGNOSIS — E43 Unspecified severe protein-calorie malnutrition: Secondary | ICD-10-CM

## 2018-09-11 LAB — GLUCOSE, CAPILLARY: Glucose-Capillary: 221 mg/dL — ABNORMAL HIGH (ref 70–99)

## 2018-09-11 LAB — BASIC METABOLIC PANEL
Anion gap: 11 (ref 5–15)
BUN: 104 mg/dL — ABNORMAL HIGH (ref 8–23)
CO2: 22 mmol/L (ref 22–32)
Calcium: 8 mg/dL — ABNORMAL LOW (ref 8.9–10.3)
Chloride: 116 mmol/L — ABNORMAL HIGH (ref 98–111)
Creatinine, Ser: 4.29 mg/dL — ABNORMAL HIGH (ref 0.61–1.24)
GFR calc Af Amer: 14 mL/min — ABNORMAL LOW (ref >60)
GFR calc non Af Amer: 12 mL/min — ABNORMAL LOW (ref >60)
Glucose, Bld: 260 mg/dL — ABNORMAL HIGH (ref 70–99)
Potassium: 3.1 mmol/L — ABNORMAL LOW (ref 3.5–5.1)
Sodium: 149 mmol/L — ABNORMAL HIGH (ref 135–145)

## 2018-09-11 LAB — CBC WITH DIFFERENTIAL/PLATELET
Abs Immature Granulocytes: 0.1 10*3/uL — ABNORMAL HIGH (ref 0.00–0.07)
Basophils Absolute: 0 10*3/uL (ref 0.0–0.1)
Basophils Relative: 0 %
Eosinophils Absolute: 0 10*3/uL (ref 0.0–0.5)
Eosinophils Relative: 0 %
HCT: 28.4 % — ABNORMAL LOW (ref 39.0–52.0)
Hemoglobin: 9.2 g/dL — ABNORMAL LOW (ref 13.0–17.0)
Immature Granulocytes: 1 %
Lymphocytes Relative: 11 %
Lymphs Abs: 1.2 10*3/uL (ref 0.7–4.0)
MCH: 30.9 pg (ref 26.0–34.0)
MCHC: 32.4 g/dL (ref 30.0–36.0)
MCV: 95.3 fL (ref 80.0–100.0)
Monocytes Absolute: 0.3 10*3/uL (ref 0.1–1.0)
Monocytes Relative: 3 %
Neutro Abs: 9.1 10*3/uL — ABNORMAL HIGH (ref 1.7–7.7)
Neutrophils Relative %: 85 %
Platelets: 159 10*3/uL (ref 150–400)
RBC: 2.98 MIL/uL — ABNORMAL LOW (ref 4.22–5.81)
RDW: 15.7 % — ABNORMAL HIGH (ref 11.5–15.5)
WBC: 10.8 10*3/uL — ABNORMAL HIGH (ref 4.0–10.5)
nRBC: 0 % (ref 0.0–0.2)

## 2018-09-11 LAB — PHOSPHORUS: Phosphorus: 3.9 mg/dL (ref 2.5–4.6)

## 2018-09-11 LAB — HEPARIN LEVEL (UNFRACTIONATED): Heparin Unfractionated: 0.52 IU/mL (ref 0.30–0.70)

## 2018-09-11 MED ORDER — POTASSIUM CHLORIDE 10 MEQ/100ML IV SOLN
10.0000 meq | INTRAVENOUS | Status: AC
  Administered 2018-09-11 (×4): 10 meq via INTRAVENOUS
  Filled 2018-09-11: qty 100

## 2018-09-11 MED ORDER — HEPARIN (PORCINE) 25000 UT/250ML-% IV SOLN
950.0000 [IU]/h | INTRAVENOUS | Status: DC
  Administered 2018-09-11 – 2018-09-12 (×2): 1000 [IU]/h via INTRAVENOUS
  Administered 2018-09-13: 14:00:00 950 [IU]/h via INTRAVENOUS
  Filled 2018-09-11 (×3): qty 250

## 2018-09-11 MED ORDER — THIAMINE HCL 100 MG/ML IJ SOLN
Freq: Once | INTRAVENOUS | Status: AC
  Administered 2018-09-11: 10:00:00 via INTRAVENOUS
  Filled 2018-09-11: qty 1000

## 2018-09-11 MED ORDER — LACTATED RINGERS IV SOLN
INTRAVENOUS | Status: DC
  Administered 2018-09-11: 10:00:00 via INTRAVENOUS

## 2018-09-11 MED ORDER — HEPARIN BOLUS VIA INFUSION
1500.0000 [IU] | Freq: Once | INTRAVENOUS | Status: AC
  Administered 2018-09-11: 1500 [IU] via INTRAVENOUS
  Filled 2018-09-11: qty 1500

## 2018-09-11 NOTE — Progress Notes (Signed)
RN contacted MD and made aware that pt. HR Has been sustained in the 40's and will occasionally go back up. Pt. Has now gone as low as 37. RN performed EKG reading sinus bradycardia with left bundle branch block. MD made aware RN will continue to monitor pt. Closely.

## 2018-09-11 NOTE — Progress Notes (Addendum)
PROGRESS NOTE    [name]  KGM:[id number] DOB: 1932/12/09 DOA: 09/09/2018 PCP: [name], MD    Brief Narrative:  85 year old male who presented with altered mentation. He does have significant past medical history for dementia,systolic heart failure, type 2 diabetes mellitus and stage III chronic kidney disease. He was found unresponsive at skilled nursing facility, apparently he complained of not feeling well in general for last several days, decreased p.o. intake.He was found unresponsive and hypotensive,EMS brought him to the hospital. On his initial physical examination his blood pressure was 96/66, heart rate 115, respiratory 23, temperature 36 C, oxygen saturation 100% he was cachectic, moist mucous membranes, coarse breath sounds bilaterally, heart S1-S2 present rhythm, the abdomen was soft nontender, no lower extremity edema, he was lethargic, opening his eyes spontaneously answering only to yes/no questions intermittently. Sodium 147, potassium 5.3, chloride 112, bicarb 17, glucose 176, BUN 111, creatinine 4.9, AST 26, ALT 18, lactic acid 2.6(venous),white count 13.2, hemoglobin 11.3, hematocrit 35.9, platelets 219.SARS COVID-19 was negative.Urinalysis negative for infection. His chest radiograph had left rotation, no infiltrates. EKG 122 bpm, with left axis deviation, sinus rhythm, left anterior fascicular block, normal intervals, poor R wave progression, no ST segment or T wave changes.  Patient was admitted to the hospital with a working diagnosis of sepsis.   Assessment & Plan:   Principal Problem:   Severe sepsis (HCC) Active Problems:   Hx of CABG and AVR   Dementia without behavioral disturbance (HCC)   Acute renal failure superimposed on stage 3 chronic kidney disease (HCC)   Chronic systolic CHF (congestive heart failure) (HCC)   Pressure injury of skin   Protein-calorie malnutrition, severe    1. Septic shock/ bacteremia. In the setting of  hypotension and positive blood culture (1 out of 3) coagulase negative, patient has been started on Cefazolin IV. Echocardiogram with no vegetation and a preserved LV systolic function. Old records personally reviewed had MSSA bacteremia in 03/2018, treated with IV antibiotics for 2 weeks. Now patient on vasopressor therapy, low dose norepinephrine 2 mcg along with stress dose steroids. Wbc at 10.8. continue to be afebrile. His mentation has improved along with his urine output.   Will continue current line of therapy with low dose norepinephrine to target a MAP of 60, there are signs of improved end organ perfusion, with improved mentation and urine output.   2. Oliguric AKI on CKD stage 3 with uremia/ anion gap metabolic acidosis/ hyperkalemia/ hypernatremia. His serum cr is trending down to 4,29, urine output increased to 540 ml over last 24 H, no clinical signs of hypervolemia. Na is trending up to 149, his P is 3,9 and serum bicarbonate now up to 22. MAP of 60 seems to be enough for renal perfusion. Will correct K with IV kcl (40 meq). Will change fluids to balanced electrolyte solutions now that acidosis seems to be improving.   3. Metabolic encephalopathy with severe calorie protein malnutrition. Today he is more and reactive to voice and touch, will continue neuro checks and aspiration precautions. Will add IV thiamine, and IV multivitamins, if mentation continue to improve will consider NG tube for feedings.    4. Pressure ulcers. Present on admission. Unable to stage lesions back, coccyx and heals. Continue with local wound care.  5. Steroid induced hyperglycemia. Fasting glucose is 260 mg/dl, will continue close monitoring, will hold on insulin for now, due to risk of hypoglycemia.   7. Left lower extremity edema. Patient has been not  ambulatory for about 2 weeks, will check US doppler, to rule out DVT.   Patient critically ill, on vasopressor therapy to prevent imminent deterioration.   Critical care time 60 minutes.   DVT prophylaxis:heparin Code Status:DNR Family Communication:no family at beside Disposition Plan/ discharge barriers:pending clinical improvement.   Body mass index is 18.37 kg/m. Malnutrition Type:  Nutrition Problem: Severe Malnutrition Etiology: chronic illness   Malnutrition Characteristics:  Signs/Symptoms: moderate fat depletion, severe fat depletion, moderate muscle depletion, severe muscle depletion   Nutrition Interventions:  Interventions: Refer to RD note for recommendations  RN Pressure Injury Documentation: Pressure Injury 09/09/18 Coccyx Mid Unstageable - Full thickness tissue loss in which the base of the ulcer is covered by slough (yellow, tan, gray, green or brown) and/or eschar (tan, brown or black) in the wound bed. (Active)  09/09/18 0530  Location: Coccyx  Location Orientation: Mid  Staging: Unstageable - Full thickness tissue loss in which the base of the ulcer is covered by slough (yellow, tan, gray, green or brown) and/or eschar (tan, brown or black) in the wound bed.  Wound Description (Comments):   Present on Admission: Yes     Pressure Injury 09/09/18 Vertebral column Mid Deep Tissue Injury - Purple or maroon localized area of discolored intact skin or blood-filled blister due to damage of underlying soft tissue from pressure and/or shear. Multiple 2" DTI on vertebrae (Active)  09/09/18 0530  Location: Vertebral column  Location Orientation: Mid  Staging: Deep Tissue Injury - Purple or maroon localized area of discolored intact skin or blood-filled blister due to damage of underlying soft tissue from pressure and/or shear.  Wound Description (Comments): Multiple 2" DTI on vertebrae  Present on Admission: Yes     Pressure Injury 09/09/18 Back Lateral;Left;Upper Deep Tissue Injury - Purple or maroon localized area of discolored intact skin or blood-filled blister due to damage of underlying soft tissue from  pressure and/or shear. (Active)  09/09/18 0530  Location: Back  Location Orientation: Lateral;Left;Upper  Staging: Deep Tissue Injury - Purple or maroon localized area of discolored intact skin or blood-filled blister due to damage of underlying soft tissue from pressure and/or shear.  Wound Description (Comments):   Present on Admission: Yes     Consultants:   Palliative Care   Procedures:     Antimicrobials:   Cefazolin     Subjective: Patient is more reactive today to touch and voice, no apparent pain or dyspnea, now on vasopressors and stress dose steroids.   Objective: Vitals:   09/11/18 0615 09/11/18 0630 09/11/18 0645 09/11/18 0700  BP: (!) 86/47 (!) 103/35 (!) 90/44 (!) 92/50  Pulse: 84 78 81 85  Resp: (!) 25 (!) 22 (!) 33 (!) 26  Temp:      TempSrc:      SpO2: 96% 96% 97% 94%  Weight:      Height:        Intake/Output Summary (Last 24 hours) at 09/11/2018 0808 Last data filed at 09/11/2018 0600 Gross per 24 hour  Intake 1927.93 ml  Output 540 ml  Net 1387.93 ml   Filed Weights   09/09/18 0530 09/10/18 0500 09/11/18 0500  Weight: 54 kg 57 kg 53.2 kg    Examination:   General: deconditioned and ill looking appearing Neurology: easy to arouse to voice and touch, responds to yes and no questions.  E ENT: positive pallor, no icterus, oral mucosa moist Cardiovascular: No JVD. S1-S2 present, rhythmic, no gallops, rubs, or murmurs. Positive right lower extremity  edema. Pulmonary: positive breath sounds bilaterally, decreased air movement, no wheezing, rhonchi or rales. Gastrointestinal. Abdomen scaphoid with no organomegaly, non tender, no rebound or guarding Skin. No rashes Musculoskeletal: no joint deformities     Data Reviewed: I have personally reviewed following labs and imaging studies  CBC: Recent Labs  Lab 09/09/18 0110 09/09/18 0634 09/10/18 0256 09/11/18 0254  WBC 13.2* 13.0* 10.4 10.8*  NEUTROABS 9.7* 9.4* 8.2* 9.1*  HGB 11.3*  10.9* 9.3* 9.2*  HCT 35.9* 35.0* 29.4* 28.4*  MCV 96.2 96.7 96.7 95.3  PLT 219 164 163 159   Basic Metabolic Panel: Recent Labs  Lab 09/09/18 0110 09/09/18 0634 09/09/18 1709 09/10/18 0256 09/11/18 0254  NA 147* 146* 148* 148* 149*  K 5.3* 5.0 4.2 3.8 3.1*  CL 112* 107 114* 119* 116*  CO2 17* 19* 17* 19* 22  GLUCOSE 176* 171* 209* 170* 260*  BUN 111* 131* 133* 126* 104*  CREATININE 4.99* 4.76* 4.73* 4.57* 4.29*  CALCIUM 8.9 7.6* 8.2* 8.2* 8.0*  PHOS  --   --   --   --  3.9   GFR: Estimated Creatinine Clearance: 9.8 mL/min (A) (by C-G formula based on SCr of 4.29 mg/dL (H)). Liver Function Tests: Recent Labs  Lab 09/09/18 0110 09/09/18 0249 09/09/18 0634  AST 26  --  26  ALT 18  --  15  ALKPHOS 148*  --  125  BILITOT 2.2* 2.0* 1.7*  PROT 7.1  --  6.5  ALBUMIN 2.8*  --  2.4*   No results for input(s): LIPASE, AMYLASE in the last 168 hours. No results for input(s): AMMONIA in the last 168 hours. Coagulation Profile: No results for input(s): INR, PROTIME in the last 168 hours. Cardiac Enzymes: No results for input(s): CKTOTAL, CKMB, CKMBINDEX, TROPONINI in the last 168 hours. BNP (last 3 results) No results for input(s): PROBNP in the last 8760 hours. HbA1C: No results for input(s): HGBA1C in the last 72 hours. CBG: Recent Labs  Lab 09/09/18 0106 09/09/18 0727 09/10/18 0726 09/11/18 0745  GLUCAP 163* 153* 158* 221*   Lipid Profile: No results for input(s): CHOL, HDL, LDLCALC, TRIG, CHOLHDL, LDLDIRECT in the last 72 hours. Thyroid Function Tests: No results for input(s): TSH, T4TOTAL, FREET4, T3FREE, THYROIDAB in the last 72 hours. Anemia Panel: No results for input(s): VITAMINB12, FOLATE, FERRITIN, TIBC, IRON, RETICCTPCT in the last 72 hours.    Radiology Studies: I have reviewed all of the imaging during this hospital visit personally     Scheduled Meds: . Chlorhexidine Gluconate Cloth  6 each Topical Daily  . heparin  5,000 Units Subcutaneous  Q8H  . hydrocortisone sod succinate (SOLU-CORTEF) inj  100 mg Intravenous Q8H  . mouth rinse  15 mL Mouth Rinse BID  . sodium chloride flush  3 mL Intravenous Q12H   Continuous Infusions: .  ceFAZolin (ANCEF) IV Stopped (09/10/18 1359)  . norepinephrine (LEVOPHED) Adult infusion 2 mcg/min (09/11/18 0600)  .  sodium bicarbonate  infusion 1000 mL 75 mL/hr at 09/11/18 0600     LOS: 2 days        [name] Annett Gula, MD

## 2018-09-11 NOTE — Progress Notes (Signed)
LE venous duplex       has been completed. Preliminary results can be found under CV proc through chart review. June Leap, BS, RDMS, RVT  Gave report of positive results to Dr. Cathlean Sauer and patient's nurse, Terance Hart.

## 2018-09-11 NOTE — Progress Notes (Signed)
ANTICOAGULATION CONSULT NOTE - Initial Consult  Pharmacy Consult for Heparin Indication: DVT  No Known Allergies  Patient Measurements: Height: 5\' 7"  (170.2 cm) Weight: 117 lb 4.6 oz (53.2 kg) IBW/kg (Calculated) : 66.1   Vital Signs: Temp: 97.6 F (36.4 C) (06/19 0804) Temp Source: Axillary (06/19 0804) BP: 88/43 (06/19 1000) Pulse Rate: 60 (06/19 1000)  Labs: Recent Labs    09/09/18 0634 09/09/18 1709 09/10/18 0256 09/11/18 0254  HGB 10.9*  --  9.3* 9.2*  HCT 35.0*  --  29.4* 28.4*  PLT 164  --  163 159  CREATININE 4.76* 4.73* 4.57* 4.29*    Estimated Creatinine Clearance: 9.8 mL/min (A) (by C-G formula based on SCr of 4.29 mg/dL (H)).   Medical History: Past Medical History:  Diagnosis Date  . Anemia   . Aortic stenosis    s/p pericardial AVR  . Aortic valve disorder   . Arthritis   . Benign prostatic hypertrophy   . CAD (coronary artery disease) 01/30/2011  . Carotid bruit   . COLONIC POLYPS 06/23/2008   Qualifier: Diagnosis of  By: Mare Ferrari, RMA, Sherri    . Coronary artery disease    s/p CABG 08/2010    . Diabetes mellitus without complication ([place])   . Dyslipidemia 10/15/2017  . Essential hypertension 08/21/2016  . H/O aortic valve replacement 08/21/2016  . Hyperlipidemia LDL goal <70 06/23/2008   Qualifier: Diagnosis of  By: Mare Ferrari, RMA, Sherri    . Orthostatic hypotension 06/22/2012  . Palpitations 08/21/2016  . Renal disorder    KIDNEY STONE  . Type 2 diabetes mellitus with hyperlipidemia ([place]) 06/23/2008   Qualifier: Diagnosis of  By: Mare Ferrari, RMA, Sherri    . Unstable angina ([place]) 07/24/2011    Medications:  Infusions:  .  ceFAZolin (ANCEF) IV Stopped (09/10/18 1359)  . lactated ringers Stopped (09/11/18 1006)  . norepinephrine (LEVOPHED) Adult infusion 2 mcg/min (09/11/18 1006)  . potassium chloride 10 mEq (09/11/18 1119)   No anticoagulation PTA.    Assessment: 85 yo M admitted fom nursing home with bacteremia on 6/17.  Known to pharmacy  from antibiotic dosing.   Dopplers + LLE DVT today.  He has been on SQ heparin since admission.  Last dose 6/19 @ 0506. CBC: Hg, pltc slightly below goal range, but stable.  No bleeding reported.   Goal of Therapy:  Heparin level 0.3-0.7 units/ml Monitor platelets by anticoagulation protocol: Yes   Plan:  Give 1500 units bolus x 1 Start heparin infusion at 1000 units/hr Check anti-Xa level in 8 hours and daily while on heparin Continue to monitor H&H and platelets  [name], [name] 09/11/2018,11:27 AM

## 2018-09-11 NOTE — Progress Notes (Signed)
Daily Progress Note   Patient Name: [name]       Date: 09/11/2018 DOB: 1932-07-11  Age: 85 y.o. MRN#: [id number] Attending Physician: Tawni Millers Primary Care Physician: Chipper Herb, MD Admit Date: 09/09/2018  Reason for Consultation/Follow-up: Establishing goals of care  Subjective: I saw and examined Mr. Lafoy today.  He remains frail and cachectic but is more awake today.  He cannot really participate in conversation regarding goals of care, but he does answer a few simple questions with yes or no answers.  I met again today with his family including 3 daughters.  We reviewed his clinical course over the last 24 hours including marginal improvement in his creatinine and urine output and slightly improved mental status.  We then discussed this in light of the larger overall picture of his decline over the last several months.  His daughters report that they have continued to see him decline in [place], and we discussed that this was well prior to current hospitalization and will continue to be a long-term problem even if he were to recover from acute infection.  We discussed that we are going to be reaching a point where, if he does continue to survive, his nutrition will be a major concern.  We talked about options of feeding tube and potential burden associated with this as his overall decline would not be fixed by addition of nutrition via tube for only a few days.  We discussed other option of refocusing care only on things for his comfort understanding that time will be short.  Discussed that this would likely entail hospital death or if he were stable enough to consider transition from the hospital, he would be best served by residential hospice if goal is for comfort  care.    Length of Stay: 2  Current Medications: Scheduled Meds:   Chlorhexidine Gluconate Cloth  6 each Topical Daily   heparin  1,500 Units Intravenous Once   hydrocortisone sod succinate (SOLU-CORTEF) inj  100 mg Intravenous Q8H   mouth rinse  15 mL Mouth Rinse BID   sodium chloride flush  3 mL Intravenous Q12H    Continuous Infusions:   ceFAZolin (ANCEF) IV Stopped (09/10/18 1359)   heparin     lactated ringers Stopped (09/11/18 1006)   norepinephrine (LEVOPHED) Adult  infusion 2 mcg/min (09/11/18 1006)   potassium chloride 10 mEq (09/11/18 1119)    PRN Meds: acetaminophen **OR** acetaminophen, glycopyrrolate, haloperidol lactate, HYDROmorphone (DILAUDID) injection, LORazepam, ondansetron **OR** ondansetron (ZOFRAN) IV, polyethylene glycol  Physical Exam         General: Awake but only intermittently able to interact, no distress.  Frail and chronically ill-appearing Lungs: Decreased air movement, scattered rhonchi and crackles Abdomen: Soft nontender Extremity: Significant muscle wasting Skin: Warm and dry  Vital Signs: BP (!) 88/43    Pulse 60    Temp 97.6 F (36.4 C) (Axillary)    Resp (!) 22    Ht '5\' 7"'$  (1.702 m)    Wt 53.2 kg    SpO2 97%    BMI 18.37 kg/m  SpO2: SpO2: 97 % O2 Device: O2 Device: Nasal Cannula O2 Flow Rate: O2 Flow Rate (L/min): 3 L/min  Intake/output summary:   Intake/Output Summary (Last 24 hours) at 09/11/2018 1215 Last data filed at 09/11/2018 1006 Gross per 24 hour  Intake 2028.95 ml  Output 540 ml  Net 1488.95 ml   LBM: Last BM Date: (09/09/18) Baseline Weight: Weight: 45.4 kg Most recent weight: Weight: 53.2 kg       Palliative Assessment/Data:      Patient Active Problem List   Diagnosis Date Noted   Protein-calorie malnutrition, severe 09/10/2018   Severe sepsis (HCC) 09/09/2018   Acute renal failure superimposed on stage 3 chronic kidney disease (HCC) 70/78/6754   Chronic systolic CHF (congestive heart  failure) ([place]) 09/09/2018   Pressure injury of skin 09/09/2018   Dehydration    Acute GI bleeding 07/15/2018   GI bleed 07/15/2018   Coronary artery disease involving native coronary artery of native heart without angina pectoris 07/08/2018   CKD (chronic kidney disease), stage III ([place]) 07/08/2018   Educated About Covid-19 Virus Infection 07/08/2018   NSTEMI (non-ST elevated myocardial infarction) ([place]) 05/13/2018   Sepsis due to Staphylococcus ([place]) 05/13/2018   PICC (peripherally inserted central catheter) in place 05/12/2018   Demand ischemia of myocardium ([place]) 04/20/2018   Bacteremia 04/20/2018   AMS (altered mental status) 04/18/2018   Vomiting 04/16/2018   Type 2 diabetes mellitus ([place]) 04/16/2018   Cerebrovascular accident (CVA) ([place]) 02/11/2018   Dementia without behavioral disturbance ([place]) 01/27/2018   Falls frequently 01/27/2018   DNR (do not resuscitate) 01/27/2018   Diabetes mellitus due to underlying condition with diabetic autonomic neuropathy, without long-term current use of insulin ([place]) 01/21/2018   Depression 12/26/2017   Syncope    Syncope and collapse    Fall    Microcytic anemia 12/25/2017   Dyslipidemia 10/15/2017   Leg swelling 10/15/2017   Palpitations 08/21/2016   HTN (hypertension) 08/21/2016   Carotid bruit 08/21/2016   H/O aortic valve replacement 08/21/2016   Anemia, iron deficiency 04/11/2014   Orthostatic hypotension 06/22/2012   Dizzy 06/22/2012   Precordial pain 09/18/2011   Unstable angina ([place]) 07/24/2011   Hx of CABG and AVR 01/30/2011   COLONIC POLYPS 06/23/2008   Hyperlipidemia LDL goal <70 06/23/2008   Aortic valve disorder 06/23/2008   BENIGN PROSTATIC HYPERTROPHY, HX OF 06/23/2008    Palliative Care Assessment & Plan   Patient Profile:  85 y.o. male  with past medical history of dementia, CAD, CHF, type 2 DM, CKD admitted on 09/09/2018 with sepsis, oliguric AKI, hypotension, and  worsening clinical status.  Family is at the bedside.  Palliative consulted for [place] and family support.   Assessment: Patient Active Problem List  Diagnosis Date Noted   Protein-calorie malnutrition, severe 09/10/2018   Severe sepsis ([place]) 09/09/2018   Acute renal failure superimposed on stage 3 chronic kidney disease (HCC) 54/86/2824   Chronic systolic CHF (congestive heart failure) ([place]) 09/09/2018   Pressure injury of skin 09/09/2018   Dehydration    Acute GI bleeding 07/15/2018   GI bleed 07/15/2018   Coronary artery disease involving native coronary artery of native heart without angina pectoris 07/08/2018   CKD (chronic kidney disease), stage III ([place]) 07/08/2018   Educated About Covid-19 Virus Infection 07/08/2018   NSTEMI (non-ST elevated myocardial infarction) ([place]) 05/13/2018   Sepsis due to Staphylococcus ([place]) 05/13/2018   PICC (peripherally inserted central catheter) in place 05/12/2018   Demand ischemia of myocardium ([place]) 04/20/2018   Bacteremia 04/20/2018   AMS (altered mental status) 04/18/2018   Vomiting 04/16/2018   Type 2 diabetes mellitus ([place]) 04/16/2018   Cerebrovascular accident (CVA) ([place]) 02/11/2018   Dementia without behavioral disturbance ([place]) 01/27/2018   Falls frequently 01/27/2018   DNR (do not resuscitate) 01/27/2018   Diabetes mellitus due to underlying condition with diabetic autonomic neuropathy, without long-term current use of insulin ([place]) 01/21/2018   Depression 12/26/2017   Syncope    Syncope and collapse    Fall    Microcytic anemia 12/25/2017   Dyslipidemia 10/15/2017   Leg swelling 10/15/2017   Palpitations 08/21/2016   HTN (hypertension) 08/21/2016   Carotid bruit 08/21/2016   H/O aortic valve replacement 08/21/2016   Anemia, iron deficiency 04/11/2014   Orthostatic hypotension 06/22/2012   Dizzy 06/22/2012   Precordial pain 09/18/2011   Unstable angina ([place]) 07/24/2011   Hx of CABG  and AVR 01/30/2011   COLONIC POLYPS 06/23/2008   Hyperlipidemia LDL goal <70 06/23/2008   Aortic valve disorder 06/23/2008   BENIGN PROSTATIC HYPERTROPHY, HX OF 06/23/2008    Recommendations/Plan:  DNR/DNI  Met again with patient's daughters.  They understand that he is critically ill and has high risk of imminent decompensation and death.  Family is in agreement that they do not want him to suffer, however, as he is intermittently awake, not showing signs of pain, shortness of breath, agitation, or anxiety they would like to continue with current interventions at this time.  We began discussion today that even if he were to recover from infection, his nutrition will continue to be a major consideration moving forward.  We had a initial discussion regarding benefit and burden of artificial nutrition and hydration and family will work to discuss what hopes are for his care based upon what they believe his wishes would be if he understood his situation.  Goals of Care and Additional Recommendations:  Limitations on Scope of Treatment: Continue current interventions.  No escalation of care.  Code Status:    Code Status Orders  (From admission, onward)         Start     Ordered   09/09/18 0536  Do not attempt resuscitation (DNR)  Continuous    Question Answer Comment  In the event of cardiac or respiratory ARREST Do not call a code blue   In the event of cardiac or respiratory ARREST Do not perform Intubation, CPR, defibrillation or ACLS   In the event of cardiac or respiratory ARREST Use medication by any route, position, wound care, and other measures to relive pain and suffering. May use oxygen, suction and manual treatment of airway obstruction as needed for comfort.      09/09/18 0535  Code Status History    Date Active Date Inactive Code Status Order ID Comments User Context   07/15/2018 1507 07/21/2018 2111 DNR [id number]  Heath Lark D, DO ED   07/15/2018 1505  07/15/2018 1507 Full Code [id number]  Heath Lark D, DO ED   04/18/2018 1914 04/22/2018 2013 DNR [id number]  Bethena Roys, MD ED   04/18/2018 1914 04/18/2018 1914 DNR [id number]  Bethena Roys, MD ED   04/18/2018 1522 04/18/2018 1914 DNR [id number]  Orlie Dakin, MD ED   04/18/2018 1244 04/18/2018 1522 DNR [id number]  Orlie Dakin, MD ED   04/18/2018 1241 04/18/2018 1244 DNR [id number]  Orlie Dakin, MD ED   04/16/2018 2259 04/18/2018 0210 DNR [id number]  Shela Leff, MD ED   04/06/2018 2333 04/07/2018 1620 Full Code [id number]  Margette Fast, MD ED   01/27/2018 1811 01/27/2018 2127 DNR [id number]  Roney Jaffe, MD Inpatient   01/26/2018 2007 01/27/2018 1810 Full Code [id number]  Bethena Roys, MD Inpatient   12/25/2017 2133 12/28/2017 1533 Full Code [id number]  Shela Leff, MD Inpatient   Advance Care Planning Activity       Prognosis:   Hours - Days most likely  Discharge Planning:  To Be Determined but still at high risk for hospital death  Care plan was discussed with patient's 3 daughters  Thank you for allowing the Palliative Medicine Team to assist in the care of this patient.   Time In: 1015 Time Out: 1100 Total Time 45 Prolonged Time Billed No      Greater than 50%  of this time was spent counseling and coordinating care related to the above assessment and plan.  Micheline Rough, MD  Please contact Palliative Medicine Team phone at [phone] for questions and concerns.

## 2018-09-11 NOTE — Progress Notes (Signed)
ANTICOAGULATION CONSULT NOTE - Initial Consult  Pharmacy Consult for Heparin Indication: DVT  No Known Allergies  Patient Measurements: Height: 5\' 7"  (170.2 cm) Weight: 117 lb 4.6 oz (53.2 kg) IBW/kg (Calculated) : 66.1   Vital Signs: Temp: 97.2 F (36.2 C) (06/19 2019) Temp Source: Axillary (06/19 2019) BP: 126/46 (06/19 2100) Pulse Rate: 48 (06/19 2100)  Labs: Recent Labs    09/09/18 0634 09/09/18 1709 09/10/18 0256 09/11/18 0254 09/11/18 2100  HGB 10.9*  --  9.3* 9.2*  --   HCT 35.0*  --  29.4* 28.4*  --   PLT 164  --  163 159  --   HEPARINUNFRC  --   --   --   --  0.52  CREATININE 4.76* 4.73* 4.57* 4.29*  --     Estimated Creatinine Clearance: 9.8 mL/min (A) (by C-G formula based on SCr of 4.29 mg/dL (H)).   Medical History: Past Medical History:  Diagnosis Date  . Anemia   . Aortic stenosis    s/p pericardial AVR  . Aortic valve disorder   . Arthritis   . Benign prostatic hypertrophy   . CAD (coronary artery disease) 01/30/2011  . Carotid bruit   . COLONIC POLYPS 06/23/2008   Qualifier: Diagnosis of  By: Mare Ferrari, RMA, Sherri    . Coronary artery disease    s/p CABG 08/2010    . Diabetes mellitus without complication ([place])   . Dyslipidemia 10/15/2017  . Essential hypertension 08/21/2016  . H/O aortic valve replacement 08/21/2016  . Hyperlipidemia LDL goal <70 06/23/2008   Qualifier: Diagnosis of  By: Mare Ferrari, RMA, Sherri    . Orthostatic hypotension 06/22/2012  . Palpitations 08/21/2016  . Renal disorder    KIDNEY STONE  . Type 2 diabetes mellitus with hyperlipidemia ([place]) 06/23/2008   Qualifier: Diagnosis of  By: Mare Ferrari, RMA, Sherri    . Unstable angina ([place]) 07/24/2011    Medications:  Infusions:  .  ceFAZolin (ANCEF) IV Stopped (09/11/18 1538)  . heparin 1,000 Units/hr (09/11/18 1600)  . lactated ringers Stopped (09/11/18 1006)  . norepinephrine (LEVOPHED) Adult infusion 2 mcg/min (09/11/18 1904)   No anticoagulation PTA.    Assessment: 85 yo M  admitted fom nursing home with bacteremia on 6/17.  Known to pharmacy from antibiotic dosing.   Dopplers + LLE DVT today.  He has been on SQ heparin since admission.  Last dose 6/19 @ 0506. CBC: Hg, pltc slightly below goal range, but stable.  No bleeding reported.   Today, 09/11/2018 Heparin 1500 unit bolus, infusion at 1000 units/hr First Hep level = 0.52 units/ml, in desired range  Goal of Therapy:  Heparin level 0.3-0.7 units/ml Monitor platelets by anticoagulation protocol: Yes   Plan:  Continue Heparin at 1000 units/hr Recheck next level with AM labs Daily Heparin level, daily CBC Monitor for s/s bleed  Minda Ditto PharmD 09/11/2018, 9:52 PM

## 2018-09-12 DIAGNOSIS — R579 Shock, unspecified: Secondary | ICD-10-CM

## 2018-09-12 LAB — CULTURE, BLOOD (ROUTINE X 2): Special Requests: ADEQUATE

## 2018-09-12 LAB — GLUCOSE, CAPILLARY
Glucose-Capillary: 172 mg/dL — ABNORMAL HIGH (ref 70–99)
Glucose-Capillary: 151 mg/dL — ABNORMAL HIGH (ref 70–99)
Glucose-Capillary: 154 mg/dL — ABNORMAL HIGH (ref 70–99)

## 2018-09-12 LAB — HEPARIN LEVEL (UNFRACTIONATED): Heparin Unfractionated: 0.55 IU/mL (ref 0.30–0.70)

## 2018-09-12 MED ORDER — THIAMINE HCL 100 MG/ML IJ SOLN
Freq: Once | INTRAVENOUS | Status: AC
  Administered 2018-09-12: 11:00:00 via INTRAVENOUS
  Filled 2018-09-12: qty 1000

## 2018-09-12 NOTE — Progress Notes (Signed)
PROGRESS NOTE    [name]  NWG:[id number] DOB: [birth year] DOA: 09/09/2018 PCP: [name], MD    Brief Narrative:  85 year old male who presented with altered mentation. He does have significant past medical history for dementia,systolic heart failure, type 2 diabetes mellitus and stage III chronic kidney disease. He was found unresponsive at skilled nursing facility, apparently he complained of not feeling well in general for last several days, decreased p.o. intake.He was found unresponsive and hypotensive,EMS brought him to the hospital. On his initial physical examination his blood pressure was 96/66, heart rate 115, respiratory 23, temperature 36 C, oxygen saturation 100% he was cachectic, moist mucous membranes, coarse breath sounds bilaterally, heart S1-S2 present rhythm, the abdomen was soft nontender, no lower extremity edema, he was lethargic, opening his eyes spontaneously answering only to yes/no questions intermittently. Sodium 147, potassium 5.3, chloride 112, bicarb 17, glucose 176, BUN 111, creatinine 4.9, AST 26, ALT 18, lactic acid 2.6(venous),white count 13.2, hemoglobin 11.3, hematocrit 35.9, platelets 219.SARS COVID-19 was negative.Urinalysis negative for infection. His chest radiograph had left rotation, no infiltrates. EKG 122 bpm, with left axis deviation, sinus rhythm, left anterior fascicular block, normal intervals, poor R wave progression, no ST segment or T wave changes.  Patient was admitted to the hospital with a working diagnosis of sepsis.    Assessment & Plan:   Principal Problem:   Severe sepsis (HCC) Active Problems:   Hx of CABG and AVR   Dementia without behavioral disturbance (HCC)   Acute renal failure superimposed on stage 3 chronic kidney disease (HCC)   Chronic systolic CHF (congestive heart failure) (HCC)   Pressure injury of skin   Protein-calorie malnutrition, severe   1. Septic shock/ bacteremia. Follow up blood  cultures with no growth, check cell count this am. On norepinephrine 2 mcg and stress dose steroids. Targeting MAP of 60 mmHg. His mentation continue to improve along with urine output, adequate end organ perfusion. Continue hydration with balanced electrolyte solutions at 75 ml per H.   2. Oliguric AKI on CKD stage 3 with uremia/ anion gap metabolic acidosis/ hyperkalemia/ hypernatremia. Continue to increase urine output to 690 ml over last 24 compared to 540 ml over prior 24 H. Follow on renal panel today. He has no signs of volume overload, net fluid balance is positive 5,334 ml since admission. Continue balanced electrolyte solutions at 75 ml per H.   3. Metabolic encephalopathy with severe calorie protein malnutrition.Mentation continue to improve, today he is responding to more elaborate questions. Will continue IV thiamine and multivitamins. Patient needs nutritional support, will have speech re-evaluation today.  Consult nutrition.   4. Pressure ulcers. Present on admission. Unable to stage lesionsback, coccyx and heals.Local wound care.  5. Steroid induced hyperglycemia/ type 2 DM mellitus. His serum glucose has been increasing, yesterday at 260, will continue to monitor capillary glucose tid, will hold on insulin therapy for now, due to poor glycogen storages and risk of hypoglycemia.   7. Left lower extremity extensive DVT. Ultrasound doppler positive for acute deep vein thrombosis involving the left common femoral vein, left femoral vein, left proximal profunda vein, left popliteal vein and left posterior tibial veins. Started on IV heparin with good toleration, will wait for renal function to improve to start on oral anticoagulation with tolerated.   Patient critically ill, on vasopressor therapy to prevent imminent deterioration.  Critical care time 60 minutes.    DVT prophylaxis: heparin   Code Status: dnr  Family Communication: I  spoke with patient's daughter over the  phone at the bedside and all questions were addressed. Will stop visitors for now.     Disposition Plan/ discharge barriers: will need snf at discharge.   Body mass index is 20.75 kg/m. Malnutrition Type:  Nutrition Problem: Severe Malnutrition Etiology: chronic illness   Malnutrition Characteristics:  Signs/Symptoms: moderate fat depletion, severe fat depletion, moderate muscle depletion, severe muscle depletion   Nutrition Interventions:  Interventions: Refer to RD note for recommendations  RN Pressure Injury Documentation: Pressure Injury 09/09/18 Coccyx Mid Unstageable - Full thickness tissue loss in which the base of the ulcer is covered by slough (yellow, tan, gray, green or brown) and/or eschar (tan, brown or black) in the wound bed. (Active)  09/09/18 0530  Location: Coccyx  Location Orientation: Mid  Staging: Unstageable - Full thickness tissue loss in which the base of the ulcer is covered by slough (yellow, tan, gray, green or brown) and/or eschar (tan, brown or black) in the wound bed.  Wound Description (Comments):   Present on Admission: Yes     Pressure Injury 09/09/18 Vertebral column Mid Deep Tissue Injury - Purple or maroon localized area of discolored intact skin or blood-filled blister due to damage of underlying soft tissue from pressure and/or shear. Multiple 2" DTI on vertebrae (Active)  09/09/18 0530  Location: Vertebral column  Location Orientation: Mid  Staging: Deep Tissue Injury - Purple or maroon localized area of discolored intact skin or blood-filled blister due to damage of underlying soft tissue from pressure and/or shear.  Wound Description (Comments): Multiple 2" DTI on vertebrae  Present on Admission: Yes     Pressure Injury 09/09/18 Back Lateral;Left;Upper Deep Tissue Injury - Purple or maroon localized area of discolored intact skin or blood-filled blister due to damage of underlying soft tissue from pressure and/or shear. (Active)   09/09/18 0530  Location: Back  Location Orientation: Lateral;Left;Upper  Staging: Deep Tissue Injury - Purple or maroon localized area of discolored intact skin or blood-filled blister due to damage of underlying soft tissue from pressure and/or shear.  Wound Description (Comments):   Present on Admission: Yes     Consultants:     Procedures:     Antimicrobials:   Cefazolin IV     Subjective: Patient is more awake and alert today, able to respond more elaborate questions and follow commands, continue on vasopressors to mainatin am MAP more than 65 mmHg.   Objective: Vitals:   09/12/18 0400 09/12/18 0425 09/12/18 0500 09/12/18 0600  BP: (!) 99/40  (!) 99/44 (!) 127/39  Pulse: (!) 45  (!) 38 (!) 46  Resp: (!) 28  18 (!) 28  Temp:  (!) 96.2 F (35.7 C)    TempSrc:  Axillary    SpO2: 100%  99% 93%  Weight:   60.1 kg   Height:        Intake/Output Summary (Last 24 hours) at 09/12/2018 0808 Last data filed at 09/12/2018 0529 Gross per 24 hour  Intake 933.51 ml  Output 690 ml  Net 243.51 ml   Filed Weights   09/10/18 0500 09/11/18 0500 09/12/18 0500  Weight: 57 kg 53.2 kg 60.1 kg    Examination:   General: deconditioned and ill looking appearing, cachectic  Neurology: Awake and alert, non focal, generalized weakness 3/5 all extremities.   E ENT: positive pallor, no icterus, oral mucosa dry Cardiovascular: No JVD. S1-S2 present, rhythmic, no gallops, rubs, or murmurs. Left lower extremity edema ++ non pitting.. Pulmonary: positive  breath sounds bilaterally, decreased air movement, no wheezing, rhonchi or rales. Gastrointestinal. Abdomen scaphoid with no organomegaly, non tender, no rebound or guarding Skin. No rashes Musculoskeletal: no joint deformities     Data Reviewed: I have personally reviewed following labs and imaging studies  CBC: Recent Labs  Lab 09/09/18 0110 09/09/18 0634 09/10/18 0256 09/11/18 0254  WBC 13.2* 13.0* 10.4 10.8*  NEUTROABS  9.7* 9.4* 8.2* 9.1*  HGB 11.3* 10.9* 9.3* 9.2*  HCT 35.9* 35.0* 29.4* 28.4*  MCV 96.2 96.7 96.7 95.3  PLT 219 164 163 159   Basic Metabolic Panel: Recent Labs  Lab 09/09/18 0110 09/09/18 0634 09/09/18 1709 09/10/18 0256 09/11/18 0254  NA 147* 146* 148* 148* 149*  K 5.3* 5.0 4.2 3.8 3.1*  CL 112* 107 114* 119* 116*  CO2 17* 19* 17* 19* 22  GLUCOSE 176* 171* 209* 170* 260*  BUN 111* 131* 133* 126* 104*  CREATININE 4.99* 4.76* 4.73* 4.57* 4.29*  CALCIUM 8.9 7.6* 8.2* 8.2* 8.0*  PHOS  --   --   --   --  3.9   GFR: Estimated Creatinine Clearance: 11.1 mL/min (A) (by C-G formula based on SCr of 4.29 mg/dL (H)). Liver Function Tests: Recent Labs  Lab 09/09/18 0110 09/09/18 0249 09/09/18 0634  AST 26  --  26  ALT 18  --  15  ALKPHOS 148*  --  125  BILITOT 2.2* 2.0* 1.7*  PROT 7.1  --  6.5  ALBUMIN 2.8*  --  2.4*   No results for input(s): LIPASE, AMYLASE in the last 168 hours. No results for input(s): AMMONIA in the last 168 hours. Coagulation Profile: No results for input(s): INR, PROTIME in the last 168 hours. Cardiac Enzymes: No results for input(s): CKTOTAL, CKMB, CKMBINDEX, TROPONINI in the last 168 hours. BNP (last 3 results) No results for input(s): PROBNP in the last 8760 hours. HbA1C: No results for input(s): HGBA1C in the last 72 hours. CBG: Recent Labs  Lab 09/09/18 0106 09/09/18 0727 09/10/18 0726 09/11/18 0745  GLUCAP 163* 153* 158* 221*   Lipid Profile: No results for input(s): CHOL, HDL, LDLCALC, TRIG, CHOLHDL, LDLDIRECT in the last 72 hours. Thyroid Function Tests: No results for input(s): TSH, T4TOTAL, FREET4, T3FREE, THYROIDAB in the last 72 hours. Anemia Panel: No results for input(s): VITAMINB12, FOLATE, FERRITIN, TIBC, IRON, RETICCTPCT in the last 72 hours.    Radiology Studies: I have reviewed all of the imaging during this hospital visit personally     Scheduled Meds: . Chlorhexidine Gluconate Cloth  6 each Topical Daily  .  hydrocortisone sod succinate (SOLU-CORTEF) inj  100 mg Intravenous Q8H  . mouth rinse  15 mL Mouth Rinse BID  . sodium chloride flush  3 mL Intravenous Q12H   Continuous Infusions: .  ceFAZolin (ANCEF) IV Stopped (09/11/18 1538)  . heparin 1,000 Units/hr (09/11/18 1600)  . lactated ringers Stopped (09/11/18 1006)  . norepinephrine (LEVOPHED) Adult infusion 2 mcg/min (09/12/18 0529)     LOS: 3 days        Gwendola Hornaday Annett Gula, MD

## 2018-09-12 NOTE — Progress Notes (Signed)
  Speech Language Pathology Treatment: Dysphagia  Patient Details Name: [name] MRN: [id number] DOB: 1932/04/25 Today's Date: 09/12/2018 Time: 9758-8325 SLP Time Calculation (min) (ACUTE ONLY): 23 min  Assessment / Plan / Recommendation Clinical Impression  Received phone call from nurse, MD requesting ST to see pt for PO readiness. When entering room, pt greeted and patient responded appropriately. Trials of ice chips, water, apple sauce and cracker given. Pt had throat clear with ice chips and water from tsp and an explosive cough with a cup sip of water. Trials of nectar thickened liquids by tsp and by cup sip were tolerated with no s/s of aspiration as well as purees. Pt had prolonged mastication and oral holding with soft solids. Recommend Dys 1, puree diet with nectar thickened liquids. ST to follow for diet tolerance, diet upgrade and aspiration precaution training.    HPI HPI: Patient is an 85 y.o. male with PMH: dementia, CAD, chronic systolic CHF, QD-8,Y chronic kidney disease state III who presented from his SNF after being found unresponsive. SNF also reporting that he had been complaining of not feeling well and was not eating much of anything in past few days. In ED, patient was hypotensive. Primary diagnosis for this admission is sepsis.      SLP Plan  Continue with current plan of care;Other (Comment)       Recommendations  Diet recommendations: Dysphagia 1 (puree);Nectar-thick liquid Liquids provided via: Cup Medication Administration: Crushed with puree Supervision: Full supervision/cueing for compensatory strategies Compensations: Small sips/bites;Minimize environmental distractions;Slow rate Postural Changes and/or Swallow Maneuvers: Seated upright 90 degrees;Upright 30-60 min after meal                Plan: Continue with current plan of care;Other (Comment)       [place], MA, CCC-SLP 09/12/2018 1:48 PM

## 2018-09-12 NOTE — Progress Notes (Signed)
ANTICOAGULATION CONSULT NOTE  Pharmacy Consult for Heparin Indication: DVT  No Known Allergies  Patient Measurements: Height: 5\' 7"  (170.2 cm) Weight: 117 lb 4.6 oz (53.2 kg) IBW/kg (Calculated) : 66.1   Vital Signs: Temp: 96.2 F (35.7 C) (06/20 0425) Temp Source: Axillary (06/20 0425) BP: 99/44 (06/20 0500) Pulse Rate: 38 (06/20 0500)  Labs: Recent Labs    09/09/18 1709 09/10/18 0256 09/11/18 0254 09/11/18 2100 09/12/18 0217  HGB  --  9.3* 9.2*  --   --   HCT  --  29.4* 28.4*  --   --   PLT  --  163 159  --   --   HEPARINUNFRC  --   --   --  0.52 0.55  CREATININE 4.73* 4.57* 4.29*  --   --     Estimated Creatinine Clearance: 9.8 mL/min (A) (by C-G formula based on SCr of 4.29 mg/dL (H)).   Medical History: Past Medical History:  Diagnosis Date  . Anemia   . Aortic stenosis    s/p pericardial AVR  . Aortic valve disorder   . Arthritis   . Benign prostatic hypertrophy   . CAD (coronary artery disease) 01/30/2011  . Carotid bruit   . COLONIC POLYPS 06/23/2008   Qualifier: Diagnosis of  By: Mare Ferrari, RMA, Sherri    . Coronary artery disease    s/p CABG 08/2010    . Diabetes mellitus without complication ([place])   . Dyslipidemia 10/15/2017  . Essential hypertension 08/21/2016  . H/O aortic valve replacement 08/21/2016  . Hyperlipidemia LDL goal <70 06/23/2008   Qualifier: Diagnosis of  By: Mare Ferrari, RMA, Sherri    . Orthostatic hypotension 06/22/2012  . Palpitations 08/21/2016  . Renal disorder    KIDNEY STONE  . Type 2 diabetes mellitus with hyperlipidemia ([place]) 06/23/2008   Qualifier: Diagnosis of  By: Mare Ferrari, RMA, Sherri    . Unstable angina ([place]) 07/24/2011    Medications:  Infusions:  .  ceFAZolin (ANCEF) IV Stopped (09/11/18 1538)  . heparin 1,000 Units/hr (09/11/18 1600)  . lactated ringers Stopped (09/11/18 1006)  . norepinephrine (LEVOPHED) Adult infusion 2 mcg/min (09/12/18 0529)   No anticoagulation PTA.    Assessment: 85 yo M admitted fom nursing  home with bacteremia on 6/17.  Known to pharmacy from antibiotic dosing.   Dopplers + LLE DVT today.  He has been on SQ heparin since admission.  Last dose 6/19 @ 0506. CBC: Hg, pltc slightly below goal range, but stable.  No bleeding reported.   Today, 09/12/2018  Heparin level therapeutic x2 on heparin infusion at 1000 units/hr  CBC: Hg low but stable, pltc trending down from baseline (219>>159)  No bleeding or infusion related issues reported by RN  Goal of Therapy:  Heparin level 0.3-0.7 units/ml Monitor platelets by anticoagulation protocol: Yes   Plan:  Continue Heparin at 1000 units/hr Daily Heparin level &y CBC Monitor for s/s bleed  Netta Cedars, PharmD, BCPS 09/12/2018, 7:02 AM

## 2018-09-12 NOTE — Progress Notes (Addendum)
Daily Progress Note   Patient Name: [name]       Date: 09/12/2018 DOB: November 15, 1932  Age: 85 y.o. MRN#: [id number] Attending Physician: Tawni Millers Primary Care Physician: Chipper Herb, MD Admit Date: 09/09/2018  Reason for Consultation/Follow-up: Establishing goals of care  Subjective: I saw and examined [name] today.  He remains frail and cachectic but is able to process questions better today.  He denies pain, anxiety, or SOB.  I called and spoke with his daughter [name].  We discussed clinical course over that last 24 hours and that he does appear to be doing better today that yesterday.  Discussed plan for speech eval and continued concern that nutrition remains continued concern despite other improvements in mental status.  Yesterday, we had talked about options of feeding tube and potential benefits and burdens associated with this as his overall decline would not be fixed by addition of nutrition via tube for only a few days.  Family to continue to discuss options.    Length of Stay: 3  Current Medications: Scheduled Meds:  . Chlorhexidine Gluconate Cloth  6 each Topical Daily  . hydrocortisone sod succinate (SOLU-CORTEF) inj  100 mg Intravenous Q8H  . mouth rinse  15 mL Mouth Rinse BID  . sodium chloride flush  3 mL Intravenous Q12H    Continuous Infusions: .  ceFAZolin (ANCEF) IV Stopped (09/11/18 1538)  . heparin 1,000 Units/hr (09/12/18 1055)  . lactated ringers Stopped (09/11/18 1006)  . norepinephrine (LEVOPHED) Adult infusion 2 mcg/min (09/12/18 0529)    PRN Meds: acetaminophen **OR** acetaminophen, glycopyrrolate, haloperidol lactate, HYDROmorphone (DILAUDID) injection, LORazepam, ondansetron **OR** ondansetron (ZOFRAN) IV, polyethylene glycol   Physical Exam         General: Awake but only intermittently able to interact, no distress.  Frail and chronically ill-appearing Lungs: Decreased air movement, scattered rhonchi and crackles Abdomen: Soft nontender Extremity: Significant muscle wasting Skin: Warm and dry  Vital Signs: BP (!) 127/39   Pulse (!) 46   Temp (!) 96.2 F (35.7 C) (Axillary)   Resp (!) 28   Ht 5\' 7"  (1.702 m)   Wt 60.1 kg   SpO2 93%   BMI 20.75 kg/m  SpO2: SpO2: 93 % O2 Device: O2 Device: Nasal Cannula O2 Flow Rate: O2 Flow Rate (L/min): 3 L/min  Intake/output summary:   Intake/Output Summary (Last 24 hours) at 09/12/2018 1057 Last data filed at 09/12/2018 0529 Gross per 24 hour  Intake 800.84 ml  Output 690 ml  Net 110.84 ml   LBM: Last BM Date: (09/09/18) Baseline Weight: Weight: 45.4 kg Most recent weight: Weight: 60.1 kg       Palliative Assessment/Data:      Patient Active Problem List   Diagnosis Date Noted  . Protein-calorie malnutrition, severe 09/10/2018  . Severe sepsis ([place]) 09/09/2018  . Acute renal failure superimposed on stage 3 chronic kidney disease ([place]) 09/09/2018  . Chronic systolic CHF (congestive heart failure) ([place]) 09/09/2018  . Pressure injury of skin 09/09/2018  . Dehydration   . Acute GI bleeding 07/15/2018  . GI bleed 07/15/2018  . Coronary artery disease involving native coronary artery of native heart without angina pectoris 07/08/2018  . CKD (chronic kidney disease), stage III ([place]) 07/08/2018  . Educated About Covid-19 Virus Infection 07/08/2018  . NSTEMI (non-ST elevated myocardial infarction) ([place]) 05/13/2018  . Sepsis due to Staphylococcus ([place]) 05/13/2018  . PICC (peripherally inserted central catheter) in place 05/12/2018  . Demand ischemia of myocardium ([place]) 04/20/2018  . Bacteremia 04/20/2018  . AMS (altered mental status) 04/18/2018  . Vomiting 04/16/2018  . Type 2 diabetes mellitus ([place]) 04/16/2018  . Cerebrovascular accident (CVA) ([place])  02/11/2018  . Dementia without behavioral disturbance ([place]) 01/27/2018  . Falls frequently 01/27/2018  . DNR (do not resuscitate) 01/27/2018  . Diabetes mellitus due to underlying condition with diabetic autonomic neuropathy, without long-term current use of insulin ([place]) 01/21/2018  . Depression 12/26/2017  . Syncope   . Syncope and collapse   . Fall   . Microcytic anemia 12/25/2017  . Dyslipidemia 10/15/2017  . Leg swelling 10/15/2017  . Palpitations 08/21/2016  . HTN (hypertension) 08/21/2016  . Carotid bruit 08/21/2016  . H/O aortic valve replacement 08/21/2016  . Anemia, iron deficiency 04/11/2014  . Orthostatic hypotension 06/22/2012  . Dizzy 06/22/2012  . Precordial pain 09/18/2011  . Unstable angina ([place]) 07/24/2011  . Hx of CABG and AVR 01/30/2011  . COLONIC POLYPS 06/23/2008  . Hyperlipidemia LDL goal <70 06/23/2008  . Aortic valve disorder 06/23/2008  . BENIGN PROSTATIC HYPERTROPHY, HX OF 06/23/2008    Palliative Care Assessment & Plan   Patient Profile:  85 y.o. male  with past medical history of dementia, CAD, CHF, type 2 DM, CKD admitted on 09/09/2018 with sepsis, oliguric AKI, hypotension, and worsening clinical status.  Family is at the bedside.  Palliative consulted for [name] and family support.   Assessment: Patient Active Problem List   Diagnosis Date Noted  . Protein-calorie malnutrition, severe 09/10/2018  . Severe sepsis ([place]) 09/09/2018  . Acute renal failure superimposed on stage 3 chronic kidney disease ([place]) 09/09/2018  . Chronic systolic CHF (congestive heart failure) ([place]) 09/09/2018  . Pressure injury of skin 09/09/2018  . Dehydration   . Acute GI bleeding 07/15/2018  . GI bleed 07/15/2018  . Coronary artery disease involving native coronary artery of native heart without angina pectoris 07/08/2018  . CKD (chronic kidney disease), stage III ([place]) 07/08/2018  . Educated About Covid-19 Virus Infection 07/08/2018  . NSTEMI (non-ST elevated  myocardial infarction) ([place]) 05/13/2018  . Sepsis due to Staphylococcus ([place]) 05/13/2018  . PICC (peripherally inserted central catheter) in place 05/12/2018  . Demand ischemia of myocardium ([place]) 04/20/2018  . Bacteremia 04/20/2018  . AMS (altered mental status) 04/18/2018  . Vomiting 04/16/2018  . Type 2 diabetes  mellitus ([place]) 04/16/2018  . Cerebrovascular accident (CVA) ([place]) 02/11/2018  . Dementia without behavioral disturbance ([place]) 01/27/2018  . Falls frequently 01/27/2018  . DNR (do not resuscitate) 01/27/2018  . Diabetes mellitus due to underlying condition with diabetic autonomic neuropathy, without long-term current use of insulin ([place]) 01/21/2018  . Depression 12/26/2017  . Syncope   . Syncope and collapse   . Fall   . Microcytic anemia 12/25/2017  . Dyslipidemia 10/15/2017  . Leg swelling 10/15/2017  . Palpitations 08/21/2016  . HTN (hypertension) 08/21/2016  . Carotid bruit 08/21/2016  . H/O aortic valve replacement 08/21/2016  . Anemia, iron deficiency 04/11/2014  . Orthostatic hypotension 06/22/2012  . Dizzy 06/22/2012  . Precordial pain 09/18/2011  . Unstable angina ([place]) 07/24/2011  . Hx of CABG and AVR 01/30/2011  . COLONIC POLYPS 06/23/2008  . Hyperlipidemia LDL goal <70 06/23/2008  . Aortic valve disorder 06/23/2008  . BENIGN PROSTATIC HYPERTROPHY, HX OF 06/23/2008    Recommendations/Plan:  DNR/DNI  While [name] is more awake and alert today, he still has long term effects of malnutrition and poor intake.  Plan for speech re-eval.  I discussed with his daughter [name] again today regarding nutrition which will continue to be a major consideration moving forward.  Continiued discussion regarding benefit and burden of artificial nutrition and hydration and family will work to discuss what hopes are for his care based upon what they believe his wishes would be if he understood his situation.  Goals of Care and Additional Recommendations:  Limitations  on Scope of Treatment: Continue current interventions.  No escalation of care.  Code Status:    Code Status Orders  (From admission, onward)         Start     Ordered   09/09/18 0536  Do not attempt resuscitation (DNR)  Continuous    Question Answer Comment  In the event of cardiac or respiratory ARREST Do not call a "code blue"   In the event of cardiac or respiratory ARREST Do not perform Intubation, CPR, defibrillation or ACLS   In the event of cardiac or respiratory ARREST Use medication by any route, position, wound care, and other measures to relive pain and suffering. May use oxygen, suction and manual treatment of airway obstruction as needed for comfort.      09/09/18 0535        Code Status History    Date Active Date Inactive Code Status Order ID Comments User Context   07/15/2018 1507 07/21/2018 2111 DNR [id number]  Heath Lark D, DO ED   07/15/2018 1505 07/15/2018 1507 Full Code [id number]  Heath Lark D, DO ED   04/18/2018 1914 04/22/2018 2013 DNR [id number]  Bethena Roys, MD ED   04/18/2018 1914 04/18/2018 1914 DNR [id number]  Bethena Roys, MD ED   04/18/2018 1522 04/18/2018 1914 DNR [id number]  Orlie Dakin, MD ED   04/18/2018 1244 04/18/2018 1522 DNR [id number]  Orlie Dakin, MD ED   04/18/2018 1241 04/18/2018 1244 DNR [id number]  Orlie Dakin, MD ED   04/16/2018 2259 04/18/2018 0210 DNR [id number]  Shela Leff, MD ED   04/06/2018 2333 04/07/2018 1620 Full Code [id number]  Margette Fast, MD ED   01/27/2018 1811 01/27/2018 2127 DNR [id number]  Roney Jaffe, MD Inpatient   01/26/2018 2007 01/27/2018 1810 Full Code [id number]  Bethena Roys, MD Inpatient   12/25/2017 2133 12/28/2017 1533 Full Code [id number]  Shela Leff, MD Inpatient   Advance Care Planning Activity  Prognosis:  Guarded  Discharge Planning:  To Be Determined but still at high risk for hospital death  Care plan was discussed with patient's daughter [name].  I  informed her that based on his improvement today that visitation restrictions were reinstated.  She expressed understanding.  Thank you for allowing the Palliative Medicine Team to assist in the care of this patient.   Time In: 1030 Time Out: 1115 Total Time 45 Prolonged Time Billed No      Greater than 50%  of this time was spent counseling and coordinating care related to the above assessment and plan.  Micheline Rough, MD  Please contact Palliative Medicine Team phone at [phone] for questions and concerns.

## 2018-09-13 DIAGNOSIS — E876 Hypokalemia: Secondary | ICD-10-CM

## 2018-09-13 DIAGNOSIS — E870 Hyperosmolality and hypernatremia: Secondary | ICD-10-CM

## 2018-09-13 LAB — CBC
HCT: 28.8 % — ABNORMAL LOW (ref 39.0–52.0)
Hemoglobin: 9.1 g/dL — ABNORMAL LOW (ref 13.0–17.0)
MCH: 30.2 pg (ref 26.0–34.0)
MCHC: 31.6 g/dL (ref 30.0–36.0)
MCV: 95.7 fL (ref 80.0–100.0)
Platelets: 204 10*3/uL (ref 150–400)
RBC: 3.01 MIL/uL — ABNORMAL LOW (ref 4.22–5.81)
RDW: 15.8 % — ABNORMAL HIGH (ref 11.5–15.5)
WBC: 14.5 10*3/uL — ABNORMAL HIGH (ref 4.0–10.5)
nRBC: 0 % (ref 0.0–0.2)

## 2018-09-13 LAB — GLUCOSE, CAPILLARY
Glucose-Capillary: 152 mg/dL — ABNORMAL HIGH (ref 70–99)
Glucose-Capillary: 151 mg/dL — ABNORMAL HIGH (ref 70–99)
Glucose-Capillary: 149 mg/dL — ABNORMAL HIGH (ref 70–99)
Glucose-Capillary: 212 mg/dL — ABNORMAL HIGH (ref 70–99)
Glucose-Capillary: 209 mg/dL — ABNORMAL HIGH (ref 70–99)

## 2018-09-13 LAB — BASIC METABOLIC PANEL
Anion gap: 13 (ref 5–15)
BUN: 113 mg/dL — ABNORMAL HIGH (ref 8–23)
CO2: 21 mmol/L — ABNORMAL LOW (ref 22–32)
Calcium: 8.1 mg/dL — ABNORMAL LOW (ref 8.9–10.3)
Chloride: 116 mmol/L — ABNORMAL HIGH (ref 98–111)
Creatinine, Ser: 3.66 mg/dL — ABNORMAL HIGH (ref 0.61–1.24)
GFR calc Af Amer: 17 mL/min — ABNORMAL LOW (ref >60)
GFR calc non Af Amer: 14 mL/min — ABNORMAL LOW (ref >60)
Glucose, Bld: 185 mg/dL — ABNORMAL HIGH (ref 70–99)
Potassium: 3.7 mmol/L (ref 3.5–5.1)
Sodium: 150 mmol/L — ABNORMAL HIGH (ref 135–145)

## 2018-09-13 LAB — HEPARIN LEVEL (UNFRACTIONATED): Heparin Unfractionated: 0.7 IU/mL (ref 0.30–0.70)

## 2018-09-13 LAB — MAGNESIUM: Magnesium: 2 mg/dL (ref 1.7–2.4)

## 2018-09-13 LAB — PHOSPHORUS: Phosphorus: 3.6 mg/dL (ref 2.5–4.6)

## 2018-09-13 MED ORDER — ENSURE ENLIVE PO LIQD
237.0000 mL | Freq: Three times a day (TID) | ORAL | Status: DC
  Administered 2018-09-13 – 2018-09-14 (×4): 237 mL via ORAL

## 2018-09-13 MED ORDER — ADULT MULTIVITAMIN W/MINERALS CH
1.0000 | ORAL_TABLET | Freq: Every day | ORAL | Status: DC
  Administered 2018-09-14: 1 via ORAL
  Filled 2018-09-13 (×2): qty 1

## 2018-09-13 MED ORDER — CEFAZOLIN SODIUM-DEXTROSE 1-4 GM/50ML-% IV SOLN
1.0000 g | Freq: Two times a day (BID) | INTRAVENOUS | Status: DC
  Administered 2018-09-13 – 2018-09-15 (×5): 1 g via INTRAVENOUS
  Filled 2018-09-13 (×6): qty 50

## 2018-09-13 MED ORDER — DEXTROSE-NACL 5-0.45 % IV SOLN
INTRAVENOUS | Status: DC
  Administered 2018-09-13 – 2018-09-15 (×3): via INTRAVENOUS

## 2018-09-13 MED ORDER — THIAMINE HCL 100 MG/ML IJ SOLN
Freq: Once | INTRAVENOUS | Status: AC
  Administered 2018-09-13: 09:00:00 via INTRAVENOUS
  Filled 2018-09-13: qty 1000

## 2018-09-13 NOTE — Progress Notes (Addendum)
PROGRESS NOTE    [name]  HKV:[id number] DOB: 20-Aug-1932 DOA: 09/09/2018 PCP: [name], MD    Brief Narrative:  85 year old male who presented with altered mentation. He does have significant past medical history for dementia,systolic heart failure, type 2 diabetes mellitus and stage III chronic kidney disease. He was found unresponsive at skilled nursing facility, apparently he complained of not feeling well in general for last several days, decreased p.o. intake.He was found unresponsive and hypotensive,EMS brought him to the hospital. On his initial physical examination his blood pressure was 96/66, heart rate 115, respiratory 23, temperature 36 C, oxygen saturation 100% he was cachectic, moist mucous membranes, coarse breath sounds bilaterally, heart S1-S2 present rhythm, the abdomen was soft nontender, no lower extremity edema, he was lethargic, opening his eyes spontaneously answering only to yes/no questions intermittently. Sodium 147, potassium 5.3, chloride 112, bicarb 17, glucose 176, BUN 111, creatinine 4.9, AST 26, ALT 18, lactic acid 2.6(venous),white count 13.2, hemoglobin 11.3, hematocrit 35.9, platelets 219.SARS COVID-19 was negative.Urinalysis negative for infection. His chest radiograph had left rotation, no infiltrates. EKG 122 bpm, with left axis deviation, sinus rhythm, left anterior fascicular block, normal intervals, poor R wave progression, no ST segment or T wave changes.  Patient was admitted to the hospital with a working diagnosis of sepsis.   Assessment & Plan:   Principal Problem:   Severe sepsis (HCC) Active Problems:   Hx of CABG and AVR   Dementia without behavioral disturbance (HCC)   Acute renal failure superimposed on stage 3 chronic kidney disease (HCC)   Chronic systolic CHF (congestive heart failure) (HCC)   Pressure injury of skin   Protein-calorie malnutrition, severe   1. Septic shock/ bacteremia. Patient now is off  norepinephrine, his MAP has been maintained above 60, and his mentation continue to improve along with his renal function. Repeat cultures have been no growth. Will continue antibiotic therapy with cefazolin, IV fluids with 0.45% saline at 75 ML per H, target MAP of 60 mmHg. Continue with stress dose steroids for now.   2. Oliguric AKI on CKD stage 3 with uremia/ anion gap metabolic acidosis/ hyperkalemia/ hypernatremia.Urine output over last 24 H is 500 cc, serum cr trending down to 3,66 with K at 3,7. Serum Na has been increasing up to 150 today. Will continue hydration with D5 0.45 (hypotronic saline), follow on renal panel in am, continue urine output monitoring, keep MAP 60 or above. Avoid nephrotoxic medications.   3. Metabolic encephalopathywith severe calorie protein malnutrition.Now patient is allowed to have dysphagia one diet, will continue to encourage po intake, continue IV multivitamins for now. Continue neuro checks. Aspiration precautions, head elevated 45 degrees at all times.   4. Pressure ulcers. Present on admission. Unable to stage lesionsback, coccyx and heals.Continue with local wound care.  5. Steroid induced hyperglycemia/ type 2 DM mellitus. Serum glucose this am 185, capillary glucose 151 to 154, will continue stress dose steroids, and capillary glucose monitoring, continue to hold on insulin therapy for now. Fluids with D5.  7. Left lower extremity extensive DVT. Ultrasound doppler positive for acute deep vein thrombosis involving the left common femoral vein, left femoral vein, left proximal profunda vein, left popliteal vein and left posterior tibial veins.  Continue anticoagulation with IV heparin    DVT prophylaxis: heparin   Code Status: dnr  Family Communication:  no family at the bedside   Disposition Plan/ discharge barriers: transfer to medical telemetry.   Body mass index is 21.44  kg/m. Malnutrition Type:  Nutrition Problem: Severe  Malnutrition Etiology: chronic illness   Malnutrition Characteristics:  Signs/Symptoms: moderate fat depletion, severe fat depletion, moderate muscle depletion, severe muscle depletion   Nutrition Interventions:  Interventions: Refer to RD note for recommendations  RN Pressure Injury Documentation: Pressure Injury 09/09/18 Coccyx Mid Unstageable - Full thickness tissue loss in which the base of the ulcer is covered by slough (yellow, tan, gray, green or brown) and/or eschar (tan, brown or black) in the wound bed. (Active)  09/09/18 0530  Location: Coccyx  Location Orientation: Mid  Staging: Unstageable - Full thickness tissue loss in which the base of the ulcer is covered by slough (yellow, tan, gray, green or brown) and/or eschar (tan, brown or black) in the wound bed.  Wound Description (Comments):   Present on Admission: Yes     Pressure Injury 09/09/18 Vertebral column Mid Deep Tissue Injury - Purple or maroon localized area of discolored intact skin or blood-filled blister due to damage of underlying soft tissue from pressure and/or shear. Multiple 2" DTI on vertebrae (Active)  09/09/18 0530  Location: Vertebral column  Location Orientation: Mid  Staging: Deep Tissue Injury - Purple or maroon localized area of discolored intact skin or blood-filled blister due to damage of underlying soft tissue from pressure and/or shear.  Wound Description (Comments): Multiple 2" DTI on vertebrae  Present on Admission: Yes     Pressure Injury 09/09/18 Back Lateral;Left;Upper Deep Tissue Injury - Purple or maroon localized area of discolored intact skin or blood-filled blister due to damage of underlying soft tissue from pressure and/or shear. (Active)  09/09/18 0530  Location: Back  Location Orientation: Lateral;Left;Upper  Staging: Deep Tissue Injury - Purple or maroon localized area of discolored intact skin or blood-filled blister due to damage of underlying soft tissue from pressure  and/or shear.  Wound Description (Comments):   Present on Admission: Yes     Pressure Injury 09/09/18 Hip Left Stage I -  Intact skin with non-blanchable redness of a localized area usually over a bony prominence. (Active)  09/09/18   Location: Hip  Location Orientation: Left  Staging: Stage I -  Intact skin with non-blanchable redness of a localized area usually over a bony prominence.  Wound Description (Comments):   Present on Admission: Yes     Pressure Injury 09/09/18 Heel Right;Left Stage II -  Partial thickness loss of dermis presenting as a shallow open ulcer with a red, pink wound bed without slough. (Active)  09/09/18   Location: Heel  Location Orientation: Right;Left  Staging: Stage II -  Partial thickness loss of dermis presenting as a shallow open ulcer with a red, pink wound bed without slough.  Wound Description (Comments):   Present on Admission: Yes     Consultants:     Procedures:     Antimicrobials:   Cefazolin     Subjective: Patient is more awake and responsive, responds to questions and follows commands, now off vasopressors and allowed to have po intake. Continue very weak and deconditioned.   Objective: Vitals:   09/13/18 0500 09/13/18 0530 09/13/18 0600 09/13/18 0751  BP: (!) 107/53 92/63 94/61    Pulse: 77 81 82   Resp: (!) 30 (!) 26 (!) 24   Temp:    97.8 F (36.6 C)  TempSrc:    Axillary  SpO2: 100% 99% 100%   Weight: 62.1 kg     Height:        Intake/Output Summary (Last 24 hours) at 09/13/2018  6213 Last data filed at 09/13/2018 0600 Gross per 24 hour  Intake 1458.85 ml  Output 500 ml  Net 958.85 ml   Filed Weights   09/11/18 0500 09/12/18 0500 09/13/18 0500  Weight: 53.2 kg 60.1 kg 62.1 kg    Examination:   General: deconditioned and ill looking appearing  Neurology: Awake and alert, generalized weakness, 2/5 proxima and distal all extremities.  E ENT: positive pallor, no icterus, oral mucosa moist Cardiovascular: No JVD.  S1-S2 present, rhythmic, no gallops, rubs, or murmurs.Left lower extremity edema ++ Pulmonary: positive breath sounds bilaterally, decreased air movement, no wheezing, rhonchi or rales. Gastrointestinal. Abdomen scaphoid no organomegaly, non tender, no rebound or guarding Skin. No rashes Musculoskeletal: no joint deformities     Data Reviewed: I have personally reviewed following labs and imaging studies  CBC: Recent Labs  Lab 09/09/18 0110 09/09/18 0634 09/10/18 0256 09/11/18 0254 09/13/18 0152  WBC 13.2* 13.0* 10.4 10.8* 14.5*  NEUTROABS 9.7* 9.4* 8.2* 9.1*  --   HGB 11.3* 10.9* 9.3* 9.2* 9.1*  HCT 35.9* 35.0* 29.4* 28.4* 28.8*  MCV 96.2 96.7 96.7 95.3 95.7  PLT 219 164 163 159 204   Basic Metabolic Panel: Recent Labs  Lab 09/09/18 0634 09/09/18 1709 09/10/18 0256 09/11/18 0254 09/13/18 0152  NA 146* 148* 148* 149* 150*  K 5.0 4.2 3.8 3.1* 3.7  CL 107 114* 119* 116* 116*  CO2 19* 17* 19* 22 21*  GLUCOSE 171* 209* 170* 260* 185*  BUN 131* 133* 126* 104* 113*  CREATININE 4.76* 4.73* 4.57* 4.29* 3.66*  CALCIUM 7.6* 8.2* 8.2* 8.0* 8.1*  MG  --   --   --   --  2.0  PHOS  --   --   --  3.9 3.6   GFR: Estimated Creatinine Clearance: 13.4 mL/min (A) (by C-G formula based on SCr of 3.66 mg/dL (H)). Liver Function Tests: Recent Labs  Lab 09/09/18 0110 09/09/18 0249 09/09/18 0634  AST 26  --  26  ALT 18  --  15  ALKPHOS 148*  --  125  BILITOT 2.2* 2.0* 1.7*  PROT 7.1  --  6.5  ALBUMIN 2.8*  --  2.4*   No results for input(s): LIPASE, AMYLASE in the last 168 hours. No results for input(s): AMMONIA in the last 168 hours. Coagulation Profile: No results for input(s): INR, PROTIME in the last 168 hours. Cardiac Enzymes: No results for input(s): CKTOTAL, CKMB, CKMBINDEX, TROPONINI in the last 168 hours. BNP (last 3 results) No results for input(s): PROBNP in the last 8760 hours. HbA1C: No results for input(s): HGBA1C in the last 72 hours. CBG: Recent Labs   Lab 09/12/18 1012 09/12/18 1226 09/12/18 1608 09/13/18 0402 09/13/18 0742  GLUCAP 172* 151* 154* 152* 151*   Lipid Profile: No results for input(s): CHOL, HDL, LDLCALC, TRIG, CHOLHDL, LDLDIRECT in the last 72 hours. Thyroid Function Tests: No results for input(s): TSH, T4TOTAL, FREET4, T3FREE, THYROIDAB in the last 72 hours. Anemia Panel: No results for input(s): VITAMINB12, FOLATE, FERRITIN, TIBC, IRON, RETICCTPCT in the last 72 hours.    Radiology Studies: I have reviewed all of the imaging during this hospital visit personally     Scheduled Meds: . Chlorhexidine Gluconate Cloth  6 each Topical Daily  . hydrocortisone sod succinate (SOLU-CORTEF) inj  100 mg Intravenous Q8H  . mouth rinse  15 mL Mouth Rinse BID  . sodium chloride flush  3 mL Intravenous Q12H   Continuous Infusions: .  ceFAZolin (ANCEF) IV    .  heparin 1,000 Units/hr (09/12/18 1055)  . lactated ringers Stopped (09/11/18 1006)  . norepinephrine (LEVOPHED) Adult infusion Stopped (09/13/18 0430)     LOS: 4 days        Vaudine Dutan Annett Gula, MD

## 2018-09-13 NOTE — Progress Notes (Signed)
Daily Progress Note   Patient Name: [name]       Date: 09/13/2018 DOB: October 03, 1932  Age: 85 y.o. MRN#: [id number] Attending Physician: Tawni Millers Primary Care Physician: Chipper Herb, MD Admit Date: 09/09/2018  Reason for Consultation/Follow-up: Establishing goals of care  Subjective: I saw and examined [name] today.  He remains frail and cachectic but is able to process questions better today.  He denies pain, anxiety, or SOB.  I called and spoke with his daughter [name].  We discussed that he has been advanced to dysphagia 1 diet and plan to see how he does over the next 24 hours between this and recommendations from dietary.  He reports he prefers chocolate when he gets Ensure.  [name] reports that she is distressed by the fact that he has continued to decline after transitioning to facility.  I explained to her at length that his overall decline in nutrition, cognition, and functional status is progression of underlying illness and that people often stop eating and drinking in the last months of life.  I explained that this is not the same as "starving" somebody, which she expressed is the perception of other members of her family.  I talked with her about natural progression of end-of-life, as well as my recommendation that he is not likely to benefit from artificial nutrition and hydration and it carries high risk of burden in his situation.  Length of Stay: 4  Current Medications: Scheduled Meds:  . Chlorhexidine Gluconate Cloth  6 each Topical Daily  . feeding supplement (ENSURE ENLIVE)  237 mL Oral TID BM  . hydrocortisone sod succinate (SOLU-CORTEF) inj  100 mg Intravenous Q8H  . mouth rinse  15 mL Mouth Rinse BID  . multivitamin with minerals  1 tablet  Oral Daily  . sodium chloride flush  3 mL Intravenous Q12H    Continuous Infusions: .  ceFAZolin (ANCEF) IV Stopped (09/13/18 0943)  . dextrose 5 % and 0.45% NaCl    . heparin 950 Units/hr (09/13/18 1339)    PRN Meds: acetaminophen **OR** acetaminophen, glycopyrrolate, haloperidol lactate, HYDROmorphone (DILAUDID) injection, ondansetron **OR** ondansetron (ZOFRAN) IV, polyethylene glycol  Physical Exam         General: Awake but only intermittently able to interact, no distress.  Frail and chronically ill-appearing Lungs: Decreased  air movement, scattered rhonchi and crackles Abdomen: Soft nontender Extremity: Significant muscle wasting Skin: Warm and dry  Vital Signs: BP 109/64 (BP Location: Right Arm)   Pulse (!) 45   Temp (!) 97.3 F (36.3 C) (Oral)   Resp 16   Ht 5\' 7"  (1.702 m)   Wt 62.1 kg   SpO2 100%   BMI 21.44 kg/m  SpO2: SpO2: 100 % O2 Device: O2 Device: Nasal Cannula O2 Flow Rate: O2 Flow Rate (L/min): 3 L/min  Intake/output summary:   Intake/Output Summary (Last 24 hours) at 09/13/2018 1624 Last data filed at 09/13/2018 1000 Gross per 24 hour  Intake 523.8 ml  Output 500 ml  Net 23.8 ml   LBM: Last BM Date: (UTA) Baseline Weight: Weight: 45.4 kg Most recent weight: Weight: 62.1 kg       Palliative Assessment/Data:      Patient Active Problem List   Diagnosis Date Noted  . Protein-calorie malnutrition, severe 09/10/2018  . Severe sepsis ([place]) 09/09/2018  . Acute renal failure superimposed on stage 3 chronic kidney disease ([place]) 09/09/2018  . Chronic systolic CHF (congestive heart failure) ([place]) 09/09/2018  . Pressure injury of skin 09/09/2018  . Dehydration   . Acute GI bleeding 07/15/2018  . GI bleed 07/15/2018  . Coronary artery disease involving native coronary artery of native heart without angina pectoris 07/08/2018  . CKD (chronic kidney disease), stage III ([place]) 07/08/2018  . Educated About Covid-19 Virus Infection 07/08/2018  .  NSTEMI (non-ST elevated myocardial infarction) ([place]) 05/13/2018  . Sepsis due to Staphylococcus ([place]) 05/13/2018  . PICC (peripherally inserted central catheter) in place 05/12/2018  . Demand ischemia of myocardium ([place]) 04/20/2018  . Bacteremia 04/20/2018  . AMS (altered mental status) 04/18/2018  . Vomiting 04/16/2018  . Type 2 diabetes mellitus ([place]) 04/16/2018  . Cerebrovascular accident (CVA) ([place]) 02/11/2018  . Dementia without behavioral disturbance ([place]) 01/27/2018  . Falls frequently 01/27/2018  . DNR (do not resuscitate) 01/27/2018  . Diabetes mellitus due to underlying condition with diabetic autonomic neuropathy, without long-term current use of insulin ([place]) 01/21/2018  . Depression 12/26/2017  . Syncope   . Syncope and collapse   . Fall   . Microcytic anemia 12/25/2017  . Dyslipidemia 10/15/2017  . Leg swelling 10/15/2017  . Palpitations 08/21/2016  . HTN (hypertension) 08/21/2016  . Carotid bruit 08/21/2016  . H/O aortic valve replacement 08/21/2016  . Anemia, iron deficiency 04/11/2014  . Orthostatic hypotension 06/22/2012  . Dizzy 06/22/2012  . Precordial pain 09/18/2011  . Unstable angina ([place]) 07/24/2011  . Hx of CABG and AVR 01/30/2011  . COLONIC POLYPS 06/23/2008  . Hyperlipidemia LDL goal <70 06/23/2008  . Aortic valve disorder 06/23/2008  . BENIGN PROSTATIC HYPERTROPHY, HX OF 06/23/2008    Palliative Care Assessment & Plan   Patient Profile:  85 y.o. male  with past medical history of dementia, CAD, CHF, type 2 DM, CKD admitted on 09/09/2018 with sepsis, oliguric AKI, hypotension, and worsening clinical status.  Family is at the bedside.  Palliative consulted for Benjamin and family support.   Assessment: Patient Active Problem List   Diagnosis Date Noted  . Protein-calorie malnutrition, severe 09/10/2018  . Severe sepsis ([place]) 09/09/2018  . Acute renal failure superimposed on stage 3 chronic kidney disease ([place]) 09/09/2018  . Chronic systolic CHF  (congestive heart failure) ([place]) 09/09/2018  . Pressure injury of skin 09/09/2018  . Dehydration   . Acute GI bleeding 07/15/2018  . GI bleed 07/15/2018  . Coronary  artery disease involving native coronary artery of native heart without angina pectoris 07/08/2018  . CKD (chronic kidney disease), stage III ([place]) 07/08/2018  . Educated About Covid-19 Virus Infection 07/08/2018  . NSTEMI (non-ST elevated myocardial infarction) ([place]) 05/13/2018  . Sepsis due to Staphylococcus ([place]) 05/13/2018  . PICC (peripherally inserted central catheter) in place 05/12/2018  . Demand ischemia of myocardium ([place]) 04/20/2018  . Bacteremia 04/20/2018  . AMS (altered mental status) 04/18/2018  . Vomiting 04/16/2018  . Type 2 diabetes mellitus ([place]) 04/16/2018  . Cerebrovascular accident (CVA) ([place]) 02/11/2018  . Dementia without behavioral disturbance ([place]) 01/27/2018  . Falls frequently 01/27/2018  . DNR (do not resuscitate) 01/27/2018  . Diabetes mellitus due to underlying condition with diabetic autonomic neuropathy, without long-term current use of insulin ([place]) 01/21/2018  . Depression 12/26/2017  . Syncope   . Syncope and collapse   . Fall   . Microcytic anemia 12/25/2017  . Dyslipidemia 10/15/2017  . Leg swelling 10/15/2017  . Palpitations 08/21/2016  . HTN (hypertension) 08/21/2016  . Carotid bruit 08/21/2016  . H/O aortic valve replacement 08/21/2016  . Anemia, iron deficiency 04/11/2014  . Orthostatic hypotension 06/22/2012  . Dizzy 06/22/2012  . Precordial pain 09/18/2011  . Unstable angina ([place]) 07/24/2011  . Hx of CABG and AVR 01/30/2011  . COLONIC POLYPS 06/23/2008  . Hyperlipidemia LDL goal <70 06/23/2008  . Aortic valve disorder 06/23/2008  . BENIGN PROSTATIC HYPERTROPHY, HX OF 06/23/2008    Recommendations/Plan:  DNR/DNI  I spoke with Christy again at length today.  Continiued discussion regarding benefit and burden of artificial nutrition and hydration and family will work  to discuss what hopes are for his care based upon what they believe his wishes would be if he understood his situation.  His daughter Casimiro Needle is actually granddaughter but he has adopted) indicates she does not feel he would want artificial nutrition hydration and she feels that he has been ready to go since his wife died.  Plan for close monitoring of intake over the next 24 hours.  Goals of Care and Additional Recommendations:  Limitations on Scope of Treatment: Continue current interventions.  No escalation of care.  Code Status:    Code Status Orders  (From admission, onward)         Start     Ordered   09/09/18 0536  Do not attempt resuscitation (DNR)  Continuous    Question Answer Comment  In the event of cardiac or respiratory ARREST Do not call a "code blue"   In the event of cardiac or respiratory ARREST Do not perform Intubation, CPR, defibrillation or ACLS   In the event of cardiac or respiratory ARREST Use medication by any route, position, wound care, and other measures to relive pain and suffering. May use oxygen, suction and manual treatment of airway obstruction as needed for comfort.      09/09/18 0535        Code Status History    Date Active Date Inactive Code Status Order ID Comments User Context   07/15/2018 1507 07/21/2018 2111 DNR [id number]  Heath Lark D, DO ED   07/15/2018 1505 07/15/2018 1507 Full Code [id number]  Heath Lark D, DO ED   04/18/2018 1914 04/22/2018 2013 DNR [id number]  Bethena Roys, MD ED   04/18/2018 1914 04/18/2018 1914 DNR [id number]  Bethena Roys, MD ED   04/18/2018 1522 04/18/2018 1914 DNR [id number]  Orlie Dakin, MD ED   04/18/2018 1244 04/18/2018 1522 DNR [id number]  Orlie Dakin, MD ED   04/18/2018 1241 04/18/2018 1244 DNR [id number]  Orlie Dakin, MD ED   04/16/2018 2259 04/18/2018 0210 DNR [id number]  Shela Leff, MD ED   04/06/2018 2333 04/07/2018 1620 Full Code [id number]  Margette Fast, MD ED   01/27/2018  1811 01/27/2018 2127 DNR [id number]  Roney Jaffe, MD Inpatient   01/26/2018 2007 01/27/2018 1810 Full Code [id number]  Bethena Roys, MD Inpatient   12/25/2017 2133 12/28/2017 1533 Full Code [id number]  Shela Leff, MD Inpatient   Advance Care Planning Activity       Prognosis:  Guarded  Discharge Planning:  To Be Determined   Care plan was discussed with patient's daughter [name].  I informed her that based on his improvement today that visitation restrictions were reinstated.  She expressed understanding.  Thank you for allowing the Palliative Medicine Team to assist in the care of this patient.   Time In: 1020 Time Out: 1100 Total Time 40 Prolonged Time Billed No      Greater than 50%  of this time was spent counseling and coordinating care related to the above assessment and plan.  Micheline Rough, MD  Please contact Palliative Medicine Team phone at [phone] for questions and concerns.

## 2018-09-13 NOTE — Progress Notes (Signed)
ANTICOAGULATION CONSULT NOTE  Pharmacy Consult for Heparin Indication: DVT  No Known Allergies  Patient Measurements: Height: 5\' 7"  (170.2 cm) Weight: 136 lb 14.5 oz (62.1 kg) IBW/kg (Calculated) : 66.1   Vital Signs: Temp: 99.1 F (37.3 C) (06/21 0345) Temp Source: Oral (06/21 0345) BP: 94/61 (06/21 0600) Pulse Rate: 82 (06/21 0600)  Labs: Recent Labs    09/11/18 0254 09/11/18 2100 09/12/18 0217 09/13/18 0152  HGB 9.2*  --   --  9.1*  HCT 28.4*  --   --  28.8*  PLT 159  --   --  204  HEPARINUNFRC  --  0.52 0.55 0.70  CREATININE 4.29*  --   --  3.66*    Estimated Creatinine Clearance: 13.4 mL/min (A) (by C-G formula based on SCr of 3.66 mg/dL (H)).   Medical History: Past Medical History:  Diagnosis Date  . Anemia   . Aortic stenosis    s/p pericardial AVR  . Aortic valve disorder   . Arthritis   . Benign prostatic hypertrophy   . CAD (coronary artery disease) 01/30/2011  . Carotid bruit   . COLONIC POLYPS 06/23/2008   Qualifier: Diagnosis of  By: Mare Ferrari, RMA, Sherri    . Coronary artery disease    s/p CABG 08/2010    . Diabetes mellitus without complication ([place])   . Dyslipidemia 10/15/2017  . Essential hypertension 08/21/2016  . H/O aortic valve replacement 08/21/2016  . Hyperlipidemia LDL goal <70 06/23/2008   Qualifier: Diagnosis of  By: Mare Ferrari, RMA, Sherri    . Orthostatic hypotension 06/22/2012  . Palpitations 08/21/2016  . Renal disorder    KIDNEY STONE  . Type 2 diabetes mellitus with hyperlipidemia ([place]) 06/23/2008   Qualifier: Diagnosis of  By: Mare Ferrari, RMA, Sherri    . Unstable angina ([place]) 07/24/2011    Medications:  Infusions:  .  ceFAZolin (ANCEF) IV    . heparin 1,000 Units/hr (09/12/18 1055)  . lactated ringers Stopped (09/11/18 1006)  . norepinephrine (LEVOPHED) Adult infusion Stopped (09/13/18 0430)   No anticoagulation PTA.    Assessment: 85 yo M admitted fom nursing home with bacteremia on 6/17.  Known to pharmacy from antibiotic  dosing.   Dopplers + LLE DVT today.  He has been on SQ heparin since admission.  Last dose 6/19 @ 0506. CBC: Hg, pltc slightly below goal range, but stable.  No bleeding reported.   Today, 09/13/2018  Heparin level therapeutic today on heparin infusion at 1000 units/hr but trending to upper end of goal range  CBC: Hg low but stable, pltc WNL  No bleeding or infusion related issues reported by RN  Scr 3.66, est CrCl ~28ml/min  Goal of Therapy:  Heparin level 0.3-0.7 units/ml Monitor platelets by anticoagulation protocol: Yes   Plan:  Decrease Heparin slightly to maintain therapeutic range to 950 units/hr Daily Heparin level &y CBC Monitor for s/s bleed F/U plans for long-term anticoagulation once renal function improves  Netta Cedars, PharmD, BCPS 09/13/2018, 7:25 AM

## 2018-09-13 NOTE — Progress Notes (Signed)
Nutrition Follow-up  RD working remotely.   DOCUMENTATION CODES:   Severe malnutrition in context of chronic illness  INTERVENTION:  - will order Ensure Enlive TID, each supplement provides 350 kcal and 20 grams of protein. - will order daily multivitamin with minerals. - continue to encourage PO intakes and staff to assist with feeding as needed.    NUTRITION DIAGNOSIS:   Severe Malnutrition related to chronic illness as evidenced by moderate fat depletion, severe fat depletion, moderate muscle depletion, severe muscle depletion.  GOAL:   Patient will meet greater than or equal to 90% of their needs, Weight gain  MONITOR:   PO intake, Supplement acceptance, Labs, Weight trends, Skin  REASON FOR ASSESSMENT:   Consult Assessment of nutrition requirement/status  ASSESSMENT:   85 year old male who presented with AMS. He has past medical history of dementia, CHF, type 2 DM, and stage 3 CKD.  He is from SNF and apparently he complained of not feeling well in general for last several days, decreased p.o. intake. He was found unresponsive and hypotensive, EMS brought him to the hospital. On admission he was lethargic, opening his eyes spontaneously and answering only to yes/no questions intermittently. Patient was admitted to the hospital with a working diagnosis of sepsis.  Weight trending significantly up since admission. Will continue to use weight from admission (6/17) to estimate nutrition needs. Diet advanced from NPO to Dysphagia 1, thin liquids at 8:20 AM today.   Per notes: septic shock/bacteremia with goal MAP of >/= 60, oliguric AKI on CKD stage 3, metabolic acidosis, hyperkalemia--resolved, hypernatremia, metabolic encephalopathy, protein calorie malnutrition, pressure injuries--present on admission.   Medications reviewed. Labs reviewed; CBGs: 152 and 151 mg/dl today, Na: 150 mmol/l, Cl: 116 mmol/l, BUN: 113 mg/dl, creatinine: 3.66 mg/dl, Ca: 8.1 mg/dl, GFR: 14 ml/min.    IVF; D5-1/2 NS @ 75 ml/hr (306 kcal).     Diet Order:   Diet Order            DIET - DYS 1 Room service appropriate? Yes; Fluid consistency: Thin  Diet effective now              EDUCATION NEEDS:   Not appropriate for education at this time  Skin:  Skin Assessment: Skin Integrity Issues: Skin Integrity Issues:: Stage I, Stage II DTI: vertebral column; back Stage I: L hip Stage II: R heel Unstageable: full thickness coccyx  Last BM:  6/18  Height:   Ht Readings from Last 1 Encounters:  09/09/18 5\' 7"  (1.702 m)    Weight:   Wt Readings from Last 1 Encounters:  09/13/18 62.1 kg    Ideal Body Weight:  67.3 kg  BMI:  Body mass index is 21.44 kg/m.  Estimated Nutritional Needs:   Kcal:  3536-1443 kcal  Protein:  100-115 grams  Fluid:  >/= 2 L/day     Jarome Matin, MS, RD, LDN, [facility] Inpatient Clinical Dietitian Pager # [phone] After hours/weekend pager # [phone]

## 2018-09-13 NOTE — Progress Notes (Signed)
Pharmacy Antibiotic Note  [name] is a 85 y.o. male admitted on 09/09/2018 with bacteremia.  Pharmacy has been consulted for cefazolin dosing. He has h/o AVR.   He had MSSE bacteremia on Apr 18, 2018 and had TEE that revealed no vegetations. He completed 2 wks of cefazolin on 05/02/2018.    Significant events this admission:  6/17: 1/2 BCx +MSSE  6/18: TTE without vegetation  09/13/2018:  Afebrile  Repeat Bcx negative so far  Renal function slowly improving  WBC slightly increased on steroids  Weaned off pressors this morning  Plan:  Increase Cefazolin 1gm x IV q12h   Monitor renal function and adjust dose as indicated  F/U repeat cx data  Consider ID consult if patient continues to improve  Height: 5\' 7"  (170.2 cm) Weight: 136 lb 14.5 oz (62.1 kg) IBW/kg (Calculated) : 66.1  Temp (24hrs), Avg:97.1 F (36.2 C), Min:94.1 F (34.5 C), Max:99.1 F (37.3 C)  Recent Labs  Lab 09/09/18 0110 09/09/18 0310 09/09/18 0634 09/09/18 1709 09/10/18 0256 09/11/18 0254 09/13/18 0152  WBC 13.2*  --  13.0*  --  10.4 10.8* 14.5*  CREATININE 4.99*  --  4.76* 4.73* 4.57* 4.29* 3.66*  LATICACIDVEN 2.6* 1.6 2.7*  --   --   --   --     Estimated Creatinine Clearance: 13.4 mL/min (A) (by C-G formula based on SCr of 3.66 mg/dL (H)).    No Known Allergies  Antimicrobials this admission: 6/17 Cefepime>>6/18 6/17 vanc>> 6/17 6/17 Flagyl >>6/18 6/18 cefazolin >>  Dose adjustments this admission: 6/21: Increase Ancef 1gm IV q12h for CrCl 10-77ml/min  Microbiology results: 6/17 BCx: GPC 1/2 S. Epidermidis, pan-sens 6/17 UCx: NG  6/17 MRSA PCR: neg 6/19 BCx: NGTD  Thank you for allowing pharmacy to be a part of this patient's care.  Netta Cedars, PharmD, BCPS 09/13/2018 7:10 AM

## 2018-09-14 LAB — GLUCOSE, CAPILLARY
Glucose-Capillary: 214 mg/dL — ABNORMAL HIGH (ref 70–99)
Glucose-Capillary: 214 mg/dL — ABNORMAL HIGH (ref 70–99)
Glucose-Capillary: 225 mg/dL — ABNORMAL HIGH (ref 70–99)

## 2018-09-14 LAB — BASIC METABOLIC PANEL
Anion gap: 11 (ref 5–15)
BUN: 109 mg/dL — ABNORMAL HIGH (ref 8–23)
CO2: 22 mmol/L (ref 22–32)
Calcium: 7.9 mg/dL — ABNORMAL LOW (ref 8.9–10.3)
Chloride: 116 mmol/L — ABNORMAL HIGH (ref 98–111)
Creatinine, Ser: 3.66 mg/dL — ABNORMAL HIGH (ref 0.61–1.24)
GFR calc Af Amer: 17 mL/min — ABNORMAL LOW (ref >60)
GFR calc non Af Amer: 14 mL/min — ABNORMAL LOW (ref >60)
Glucose, Bld: 257 mg/dL — ABNORMAL HIGH (ref 70–99)
Potassium: 3.3 mmol/L — ABNORMAL LOW (ref 3.5–5.1)
Sodium: 149 mmol/L — ABNORMAL HIGH (ref 135–145)

## 2018-09-14 LAB — CULTURE, BLOOD (ROUTINE X 2): Culture: NO GROWTH

## 2018-09-14 MED ORDER — THIAMINE HCL 100 MG/ML IJ SOLN
Freq: Once | INTRAVENOUS | Status: AC
  Administered 2018-09-14: 13:00:00 via INTRAVENOUS
  Filled 2018-09-14: qty 1000

## 2018-09-14 MED ORDER — ENOXAPARIN SODIUM 60 MG/0.6ML ~~LOC~~ SOLN
1.0000 mg/kg | SUBCUTANEOUS | Status: DC
  Administered 2018-09-14 – 2018-09-15 (×2): 60 mg via SUBCUTANEOUS
  Filled 2018-09-14 (×2): qty 0.6

## 2018-09-14 NOTE — Progress Notes (Signed)
Inpatient Diabetes Program Recommendations  AACE/ADA: New Consensus Statement on Inpatient Glycemic Control (2015)  Target Ranges:  Prepandial:   less than 140 mg/dL      Peak postprandial:   less than 180 mg/dL (1-2 hours)      Critically ill patients:  140 - 180 mg/dL   Lab Results  Component Value Date   GLUCAP 214 (H) 09/14/2018   HGBA1C 5.5 04/19/2018    Review of Glycemic Control  149-257 mg/dL over past 24H.  If appropriate, consider Novolog 0-9 units tidwc.  Thank you. Lorenda Peck, RD, LDN, CDE Inpatient Diabetes Coordinator [phone]

## 2018-09-14 NOTE — Progress Notes (Addendum)
PROGRESS NOTE    [name]  WUJ:[id number] DOB: [birth year] DOA: 09/09/2018 PCP: [name], MD    Brief Narrative:  85 year old male who presented with altered mentation. He does have significant past medical history for dementia,systolic heart failure, type 2 diabetes mellitus and stage III chronic kidney disease. He was found unresponsive at skilled nursing facility, apparently he complained of not feeling well in general for last several days, decreased p.o. intake.He was found unresponsive and hypotensive,EMS brought him to the hospital. On his initial physical examination his blood pressure was 96/66, heart rate 115, respiratory 23, temperature 36 C, oxygen saturation 100% he was cachectic, moist mucous membranes, coarse breath sounds bilaterally, heart S1-S2 present rhythm, the abdomen was soft nontender, no lower extremity edema, he was lethargic, opening his eyes spontaneously answering only to yes/no questions intermittently. Sodium 147, potassium 5.3, chloride 112, bicarb 17, glucose 176, BUN 111, creatinine 4.9, AST 26, ALT 18, lactic acid 2.6(venous),white count 13.2, hemoglobin 11.3, hematocrit 35.9, platelets 219.SARS COVID-19 was negative.Urinalysis negative for infection. His chest radiograph had left rotation, no infiltrates. EKG 122 bpm, with left axis deviation, sinus rhythm, left anterior fascicular block, normal intervals, poor R wave progression, no ST segment or T wave changes.  Patient was admitted to the hospital with a working diagnosis of sepsis.  Developed shock, refractive to IV fluids and was started on low dose norepinephrine per peripheral vascular access, on 09/10/18.  Discontinue norepinephrine drip on 09/13/18, diet advance to dysphagia 1 and transferred to telemetry.   Assessment & Plan:   Principal Problem:   Severe sepsis (HCC) Active Problems:   Hx of CABG and AVR   Dementia without behavioral disturbance (HCC)   Acute renal  failure superimposed on stage 3 chronic kidney disease (HCC)   Chronic systolic CHF (congestive heart failure) (HCC)   Pressure injury of skin   Protein-calorie malnutrition, severe  1. Septic shock/ bacteremia.Systolic blood pressure has been 105 mmHg, with HR 41 to 65 bpm. Patient continue to have a very poor oral intake. Repeat blood cultures are no growth. Will continue hydration with D5 0.45% at 75 ml, stress dose steroids and antibiotic therapy with cefazolin. In no further signs of infection will dc antibiotic therapy in 24 to 48 H.   2. Oliguric AKI on CKD stage 3 with uremia/ anion gap metabolic acidosis/ hyperkalemia/ hypernatremia. His renal function continue to be stable with a serum cr at 3,6, K at 3,3 and serum bicarbonate at 22. His urine output continue to be low at 275 ml over last 24 H, will continue supportive medical therapy with IV fluids (hypotonic saline), will correct K with Kcl and will follow on renal panel in am.   3. Metabolic encephalopathywith severe calorie protein malnutrition.Now patient is allowed to have dysphagia one diet, but continue to have no oral intake due to debility. Will continue IV multivitamins, and aspiration precautions.   4. Pressure ulcers. Present on admission. Unable to stage lesionsback, coccyx and heals.Continue with local wound care, per protocol.  5. Steroid induced hyperglycemia/ type 2 DM mellitus.Patient is tolerating well stress dose steroids, his fasting glucose this am is 109, continue to have very poor oral intake. Hold on insulin therapy to prevent hypoglycemia.   7.Left lower extremity extensive DVT. Ultrasound doppler positive for acute deep vein thrombosis involving the left common femoral vein, left femoral vein, left proximal profunda vein, left popliteal vein and left posterior tibial veins.  Difficult to monitor heparin due to poor vascular  access, will continue anticoagulation with enoxaparin per pharmacy  protocol.    DVT prophylaxis:enoxaparin.  Code Status:dnr Family Communication: no family at the bedside   Disposition Plan/ discharge barriers: Pending placement at snf.   Body mass index is 21.34 kg/m. Malnutrition Type:  Nutrition Problem: Severe Malnutrition Etiology: chronic illness   Malnutrition Characteristics:  Signs/Symptoms: moderate fat depletion, severe fat depletion, moderate muscle depletion, severe muscle depletion   Nutrition Interventions:  Interventions: Refer to RD note for recommendations  RN Pressure Injury Documentation: Pressure Injury 09/09/18 Coccyx Mid Unstageable - Full thickness tissue loss in which the base of the ulcer is covered by slough (yellow, tan, gray, green or brown) and/or eschar (tan, brown or black) in the wound bed. (Active)  09/09/18 0530  Location: Coccyx  Location Orientation: Mid  Staging: Unstageable - Full thickness tissue loss in which the base of the ulcer is covered by slough (yellow, tan, gray, green or brown) and/or eschar (tan, brown or black) in the wound bed.  Wound Description (Comments):   Present on Admission: Yes     Pressure Injury 09/09/18 Vertebral column Mid Deep Tissue Injury - Purple or maroon localized area of discolored intact skin or blood-filled blister due to damage of underlying soft tissue from pressure and/or shear. Multiple 2" DTI on vertebrae (Active)  09/09/18 0530  Location: Vertebral column  Location Orientation: Mid  Staging: Deep Tissue Injury - Purple or maroon localized area of discolored intact skin or blood-filled blister due to damage of underlying soft tissue from pressure and/or shear.  Wound Description (Comments): Multiple 2" DTI on vertebrae  Present on Admission: Yes     Pressure Injury 09/09/18 Back Lateral;Left;Upper Deep Tissue Injury - Purple or maroon localized area of discolored intact skin or blood-filled blister due to damage of underlying soft tissue from pressure  and/or shear. (Active)  09/09/18 0530  Location: Back  Location Orientation: Lateral;Left;Upper  Staging: Deep Tissue Injury - Purple or maroon localized area of discolored intact skin or blood-filled blister due to damage of underlying soft tissue from pressure and/or shear.  Wound Description (Comments):   Present on Admission: Yes     Pressure Injury 09/09/18 Hip Left Stage I -  Intact skin with non-blanchable redness of a localized area usually over a bony prominence. (Active)  09/09/18   Location: Hip  Location Orientation: Left  Staging: Stage I -  Intact skin with non-blanchable redness of a localized area usually over a bony prominence.  Wound Description (Comments):   Present on Admission: Yes     Pressure Injury 09/09/18 Heel Right;Left Stage II -  Partial thickness loss of dermis presenting as a shallow open ulcer with a red, pink wound bed without slough. (Active)  09/09/18   Location: Heel  Location Orientation: Right;Left  Staging: Stage II -  Partial thickness loss of dermis presenting as a shallow open ulcer with a red, pink wound bed without slough.  Wound Description (Comments):   Present on Admission: Yes     Consultants:   Palliative Care  Procedures:     Antimicrobials:       Subjective: Patient has been transferred to the medical ward, he continue to be reactive to voice and touch but not communicative, he is keeping the food in his mouth and not eating or drinking.   Objective: Vitals:   09/13/18 1058 09/13/18 2100 09/14/18 0536 09/14/18 0700  BP: 109/64 (!) 102/50 (!) 105/47   Pulse: (!) 45 (!) 44 (!) 41  Resp: 16 18 16    Temp: (!) 97.3 F (36.3 C) (!) 97.5 F (36.4 C) (!) 97.5 F (36.4 C)   TempSrc: Axillary Axillary Oral   SpO2: 100% 100% 100%   Weight:    61.8 kg  Height:        Intake/Output Summary (Last 24 hours) at 09/14/2018 1105 Last data filed at 09/14/2018 0147 Gross per 24 hour  Intake 250.93 ml  Output 275 ml  Net  -24.07 ml   Filed Weights   09/12/18 0500 09/13/18 0500 09/14/18 0700  Weight: 60.1 kg 62.1 kg 61.8 kg    Examination:   General: deconditioned and ill looking appearing, cachectic.  Neurology: Awake, hyporeactive, positive weakness, 2/5 proximal and distal.  E ENT: no pallor, no icterus, oral mucosa moist Cardiovascular: No JVD. S1-S2 present, rhythmic, no gallops, rubs, or murmurs. Left lower extremity edema ++ Pulmonary: positive breath sounds bilaterally, decreased air movement, no wheezing, rhonchi or rales. Gastrointestinal. Abdomen scaphoid with no organomegaly, non tender, no rebound or guarding Skin. No rashes Musculoskeletal: no joint deformities     Data Reviewed: I have personally reviewed following labs and imaging studies  CBC: Recent Labs  Lab 09/09/18 0110 09/09/18 0634 09/10/18 0256 09/11/18 0254 09/13/18 0152  WBC 13.2* 13.0* 10.4 10.8* 14.5*  NEUTROABS 9.7* 9.4* 8.2* 9.1*  --   HGB 11.3* 10.9* 9.3* 9.2* 9.1*  HCT 35.9* 35.0* 29.4* 28.4* 28.8*  MCV 96.2 96.7 96.7 95.3 95.7  PLT 219 164 163 159 204   Basic Metabolic Panel: Recent Labs  Lab 09/09/18 1709 09/10/18 0256 09/11/18 0254 09/13/18 0152 09/14/18 0847  NA 148* 148* 149* 150* 149*  K 4.2 3.8 3.1* 3.7 3.3*  CL 114* 119* 116* 116* 116*  CO2 17* 19* 22 21* 22  GLUCOSE 209* 170* 260* 185* 257*  BUN 133* 126* 104* 113* 109*  CREATININE 4.73* 4.57* 4.29* 3.66* 3.66*  CALCIUM 8.2* 8.2* 8.0* 8.1* 7.9*  MG  --   --   --  2.0  --   PHOS  --   --  3.9 3.6  --    GFR: Estimated Creatinine Clearance: 13.4 mL/min (A) (by C-G formula based on SCr of 3.66 mg/dL (H)). Liver Function Tests: Recent Labs  Lab 09/09/18 0110 09/09/18 0249 09/09/18 0634  AST 26  --  26  ALT 18  --  15  ALKPHOS 148*  --  125  BILITOT 2.2* 2.0* 1.7*  PROT 7.1  --  6.5  ALBUMIN 2.8*  --  2.4*   No results for input(s): LIPASE, AMYLASE in the last 168 hours. No results for input(s): AMMONIA in the last 168 hours.  Coagulation Profile: No results for input(s): INR, PROTIME in the last 168 hours. Cardiac Enzymes: No results for input(s): CKTOTAL, CKMB, CKMBINDEX, TROPONINI in the last 168 hours. BNP (last 3 results) No results for input(s): PROBNP in the last 8760 hours. HbA1C: No results for input(s): HGBA1C in the last 72 hours. CBG: Recent Labs  Lab 09/13/18 0742 09/13/18 1125 09/13/18 1703 09/13/18 2102 09/14/18 0742  GLUCAP 151* 149* 212* 209* 214*   Lipid Profile: No results for input(s): CHOL, HDL, LDLCALC, TRIG, CHOLHDL, LDLDIRECT in the last 72 hours. Thyroid Function Tests: No results for input(s): TSH, T4TOTAL, FREET4, T3FREE, THYROIDAB in the last 72 hours. Anemia Panel: No results for input(s): VITAMINB12, FOLATE, FERRITIN, TIBC, IRON, RETICCTPCT in the last 72 hours.    Radiology Studies: I have reviewed all of the imaging during this hospital  visit personally     Scheduled Meds: . Chlorhexidine Gluconate Cloth  6 each Topical Daily  . enoxaparin (LOVENOX) injection  1 mg/kg Subcutaneous Q24H  . feeding supplement (ENSURE ENLIVE)  237 mL Oral TID BM  . hydrocortisone sod succinate (SOLU-CORTEF) inj  100 mg Intravenous Q8H  . mouth rinse  15 mL Mouth Rinse BID  . multivitamin with minerals  1 tablet Oral Daily  . sodium chloride flush  3 mL Intravenous Q12H   Continuous Infusions: .  ceFAZolin (ANCEF) IV 1 g (09/14/18 0949)  . dextrose 5 % and 0.45% NaCl 75 mL/hr at 09/13/18 2157     LOS: 5 days        [name] Annett Gula, MD

## 2018-09-14 NOTE — Progress Notes (Signed)
Daily Progress Note   Patient Name: [name]       Date: 09/14/2018 DOB: [birth year]  Age: 85 y.o. MRN#: [id number] Attending Physician: Tawni Millers Primary Care Physician: Chipper Herb, MD Admit Date: 09/09/2018  Reason for Consultation/Follow-up: Establishing goals of care  Subjective: I saw and examined [name] today.  He remains frail and cachectic but appears weaker today.  He denies pain, anxiety, or SOB.  Discussed with bedside care team and he is not taking in any PO intake at this point.  I called and spoke with his daughter [name].  We discussed that he has been advanced to dysphagia 1 diet and has been offered food as well as Ensure but he is not taking in any nutrition or hydration.   I talked with her about natural progression of end-of-life, as well as my recommendation that he is not likely to benefit from artificial nutrition and hydration and it carries high risk of burden in his situation.  We discussed recommendation for transition to residential hospice for end of life care.  Length of Stay: 5  Current Medications: Scheduled Meds:  . Chlorhexidine Gluconate Cloth  6 each Topical Daily  . enoxaparin (LOVENOX) injection  1 mg/kg Subcutaneous Q24H  . feeding supplement (ENSURE ENLIVE)  237 mL Oral TID BM  . hydrocortisone sod succinate (SOLU-CORTEF) inj  100 mg Intravenous Q8H  . mouth rinse  15 mL Mouth Rinse BID  . multivitamin with minerals  1 tablet Oral Daily  . sodium chloride flush  3 mL Intravenous Q12H    Continuous Infusions: .  ceFAZolin (ANCEF) IV 1 g (09/14/18 2300)  . dextrose 5 % and 0.45% NaCl 75 mL/hr at 09/14/18 1237    PRN Meds: acetaminophen **OR** acetaminophen, glycopyrrolate, haloperidol lactate, HYDROmorphone  (DILAUDID) injection, ondansetron **OR** ondansetron (ZOFRAN) IV, polyethylene glycol  Physical Exam         General: Awake but only intermittently able to interact, no distress.  Frail and chronically ill-appearing.  Appears weaker today. Lungs: Decreased air movement, scattered rhonchi and crackles Abdomen: Soft nontender Extremity: Significant muscle wasting Skin: Warm and dry  Vital Signs: BP (!) 94/46 (BP Location: Left Arm)   Pulse (!) 50   Temp 98.2 F (36.8 C)   Resp 20   Ht 5\' 7"  (1.702  m)   Wt 61.8 kg   SpO2 100%   BMI 21.34 kg/m  SpO2: SpO2: 100 % O2 Device: O2 Device: Nasal Cannula O2 Flow Rate: O2 Flow Rate (L/min): 3 L/min  Intake/output summary:   Intake/Output Summary (Last 24 hours) at 09/14/2018 2349 Last data filed at 09/14/2018 0147 Gross per 24 hour  Intake -  Output 275 ml  Net -275 ml   LBM: Last BM Date: 09/12/18 Baseline Weight: Weight: 45.4 kg Most recent weight: Weight: 61.8 kg       Palliative Assessment/Data:      Patient Active Problem List   Diagnosis Date Noted  . Protein-calorie malnutrition, severe 09/10/2018  . Severe sepsis ([place]) 09/09/2018  . Acute renal failure superimposed on stage 3 chronic kidney disease ([place]) 09/09/2018  . Chronic systolic CHF (congestive heart failure) ([place]) 09/09/2018  . Pressure injury of skin 09/09/2018  . Dehydration   . Acute GI bleeding 07/15/2018  . GI bleed 07/15/2018  . Coronary artery disease involving native coronary artery of native heart without angina pectoris 07/08/2018  . CKD (chronic kidney disease), stage III ([place]) 07/08/2018  . Educated About Covid-19 Virus Infection 07/08/2018  . NSTEMI (non-ST elevated myocardial infarction) ([place]) 05/13/2018  . Sepsis due to Staphylococcus ([place]) 05/13/2018  . PICC (peripherally inserted central catheter) in place 05/12/2018  . Demand ischemia of myocardium ([place]) 04/20/2018  . Bacteremia 04/20/2018  . AMS (altered mental status) 04/18/2018  .  Vomiting 04/16/2018  . Type 2 diabetes mellitus ([place]) 04/16/2018  . Cerebrovascular accident (CVA) ([place]) 02/11/2018  . Dementia without behavioral disturbance ([place]) 01/27/2018  . Falls frequently 01/27/2018  . DNR (do not resuscitate) 01/27/2018  . Diabetes mellitus due to underlying condition with diabetic autonomic neuropathy, without long-term current use of insulin ([place]) 01/21/2018  . Depression 12/26/2017  . Syncope   . Syncope and collapse   . Fall   . Microcytic anemia 12/25/2017  . Dyslipidemia 10/15/2017  . Leg swelling 10/15/2017  . Palpitations 08/21/2016  . HTN (hypertension) 08/21/2016  . Carotid bruit 08/21/2016  . H/O aortic valve replacement 08/21/2016  . Anemia, iron deficiency 04/11/2014  . Orthostatic hypotension 06/22/2012  . Dizzy 06/22/2012  . Precordial pain 09/18/2011  . Unstable angina ([place]) 07/24/2011  . Hx of CABG and AVR 01/30/2011  . COLONIC POLYPS 06/23/2008  . Hyperlipidemia LDL goal <70 06/23/2008  . Aortic valve disorder 06/23/2008  . BENIGN PROSTATIC HYPERTROPHY, HX OF 06/23/2008    Palliative Care Assessment & Plan   Patient Profile:  85 y.o. male  with past medical history of dementia, CAD, CHF, type 2 DM, CKD admitted on 09/09/2018 with sepsis, oliguric AKI, hypotension, and worsening clinical status.  Family is at the bedside.  Palliative consulted for [place] and family support.   Assessment: Patient Active Problem List   Diagnosis Date Noted  . Protein-calorie malnutrition, severe 09/10/2018  . Severe sepsis ([place]) 09/09/2018  . Acute renal failure superimposed on stage 3 chronic kidney disease ([place]) 09/09/2018  . Chronic systolic CHF (congestive heart failure) ([place]) 09/09/2018  . Pressure injury of skin 09/09/2018  . Dehydration   . Acute GI bleeding 07/15/2018  . GI bleed 07/15/2018  . Coronary artery disease involving native coronary artery of native heart without angina pectoris 07/08/2018  . CKD (chronic kidney disease), stage III  ([place]) 07/08/2018  . Educated About Covid-19 Virus Infection 07/08/2018  . NSTEMI (non-ST elevated myocardial infarction) ([place]) 05/13/2018  . Sepsis due to Staphylococcus ([place]) 05/13/2018  . PICC (  peripherally inserted central catheter) in place 05/12/2018  . Demand ischemia of myocardium ([place]) 04/20/2018  . Bacteremia 04/20/2018  . AMS (altered mental status) 04/18/2018  . Vomiting 04/16/2018  . Type 2 diabetes mellitus ([place]) 04/16/2018  . Cerebrovascular accident (CVA) ([place]) 02/11/2018  . Dementia without behavioral disturbance ([place]) 01/27/2018  . Falls frequently 01/27/2018  . DNR (do not resuscitate) 01/27/2018  . Diabetes mellitus due to underlying condition with diabetic autonomic neuropathy, without long-term current use of insulin ([place]) 01/21/2018  . Depression 12/26/2017  . Syncope   . Syncope and collapse   . Fall   . Microcytic anemia 12/25/2017  . Dyslipidemia 10/15/2017  . Leg swelling 10/15/2017  . Palpitations 08/21/2016  . HTN (hypertension) 08/21/2016  . Carotid bruit 08/21/2016  . H/O aortic valve replacement 08/21/2016  . Anemia, iron deficiency 04/11/2014  . Orthostatic hypotension 06/22/2012  . Dizzy 06/22/2012  . Precordial pain 09/18/2011  . Unstable angina ([place]) 07/24/2011  . Hx of CABG and AVR 01/30/2011  . COLONIC POLYPS 06/23/2008  . Hyperlipidemia LDL goal <70 06/23/2008  . Aortic valve disorder 06/23/2008  . BENIGN PROSTATIC HYPERTROPHY, HX OF 06/23/2008    Recommendations/Plan:  DNR/DNI  I spoke with Christy again at length today.  Mr. Theurer is not taking in any nutrition or hydration.  We discussed benefit and burdens of artificial nutrition and hydration at end of life and in advanced dementia.  At this point, [name] reports that she believes that his desire would be to focus on comfort and dignity as he approaches end of life.  She would like to work to transition him to residential hospice for end of life care.  Preference for inpatient unit  in [facility]) due to family location.  Will consult social work to confirm choice and facilitate referral.  Code Status:    Code Status Orders  (From admission, onward)         Start     Ordered   09/09/18 0536  Do not attempt resuscitation (DNR)  Continuous    Question Answer Comment  In the event of cardiac or respiratory ARREST Do not call a "code blue"   In the event of cardiac or respiratory ARREST Do not perform Intubation, CPR, defibrillation or ACLS   In the event of cardiac or respiratory ARREST Use medication by any route, position, wound care, and other measures to relive pain and suffering. May use oxygen, suction and manual treatment of airway obstruction as needed for comfort.      09/09/18 0535        Code Status History    Date Active Date Inactive Code Status Order ID Comments User Context   07/15/2018 1507 07/21/2018 2111 DNR [id number]  Heath Lark D, DO ED   07/15/2018 1505 07/15/2018 1507 Full Code [id number]  Heath Lark D, DO ED   04/18/2018 1914 04/22/2018 2013 DNR [id number]  Bethena Roys, MD ED   04/18/2018 1914 04/18/2018 1914 DNR [id number]  Bethena Roys, MD ED   04/18/2018 1522 04/18/2018 1914 DNR [id number]  Orlie Dakin, MD ED   04/18/2018 1244 04/18/2018 1522 DNR [id number]  Orlie Dakin, MD ED   04/18/2018 1241 04/18/2018 1244 DNR [id number]  Orlie Dakin, MD ED   04/16/2018 2259 04/18/2018 0210 DNR [id number]  Shela Leff, MD ED   04/06/2018 2333 04/07/2018 1620 Full Code [id number]  Margette Fast, MD ED   01/27/2018 1811 01/27/2018 2127 DNR [id number]  Roney Jaffe, MD Inpatient   01/26/2018  2007 01/27/2018 1810 Full Code [id number]  Bethena Roys, MD Inpatient   12/25/2017 2133 12/28/2017 1533 Full Code [id number]  Shela Leff, MD Inpatient   Advance Care Planning Activity       Prognosis:  < 2 weeks- His condition remains critical and he is not taking in any oral nutrition or hydration.  With a focus  on comfort, his prognosis is < 2 weeks and he would be well served by residential hospice placement if it can be arranged.  Discharge Planning:  Hospice facility   Care plan was discussed with patient's daughter [name] [facility]), daughter Clarene Critchley (she was in hospital for her uncle- Mr. Mercado brother in law- who is actively dying), and Dr. Cathlean Sauer  Thank you for allowing the Palliative Medicine Team to assist in the care of this patient.   Time In: 1700 Time Out: 1745 Total Time 45 Prolonged Time Billed No      Greater than 50%  of this time was spent counseling and coordinating care related to the above assessment and plan.  Micheline Rough, MD  Please contact Palliative Medicine Team phone at [phone] for questions and concerns.

## 2018-09-14 NOTE — Progress Notes (Signed)
ANTICOAGULATION CONSULT NOTE  Pharmacy Consult for Heparin Indication: DVT  No Known Allergies  Patient Measurements: Height: 5\' 7"  (170.2 cm) Weight: 136 lb 3.9 oz (61.8 kg) IBW/kg (Calculated) : 66.1   Vital Signs: Temp: 97.5 F (36.4 C) (06/22 0536) Temp Source: Oral (06/22 0536) BP: 105/47 (06/22 0536) Pulse Rate: 41 (06/22 0536)  Labs: Recent Labs    09/11/18 2100 09/12/18 0217 09/13/18 0152  HGB  --   --  9.1*  HCT  --   --  28.8*  PLT  --   --  204  HEPARINUNFRC 0.52 0.55 0.70  CREATININE  --   --  3.66*    Estimated Creatinine Clearance: 13.4 mL/min (A) (by C-G formula based on SCr of 3.66 mg/dL (H)).  Medications:  Infusions:  .  ceFAZolin (ANCEF) IV 1 g (09/13/18 2159)  . dextrose 5 % and 0.45% NaCl 75 mL/hr at 09/13/18 2157   No anticoagulation PTA.    Assessment: 85 yo M admitted fom nursing home with bacteremia on 6/17.  Known to pharmacy from antibiotic dosing. Dopplers + LLE DVT today.  He has been on SQ heparin since admission.  Last dose 6/19 @ 0506.  Today, 09/14/2018  Lab unable to obtain heparin level after multiple attempts; MD notified - will switch to Lovenox today  CBC: Hg low but stable, pltc WNL  No bleeding or infusion related issues reported by RN  Last SCr 3.66 yesterday, consistently trending down from admission  Goal of Therapy:  Anti-Xa level 0.6-1 units/ml 4hrs after LMWH dose given Monitor platelets by anticoagulation protocol: Yes   Plan:   Stop heparin  Start Lovenox 60 mg SQ daily this AM  Monitor for bleeding or worsened thrombosis  Check SCr and CBC as able - hopefully will be able to transition to DOAC if SCr improves and c/w [place], PharmD, BCPS [phone] 09/14/2018, 8:47 AM

## 2018-09-14 NOTE — Progress Notes (Addendum)
SLP Cancellation Note  Patient Details Name: [name] MRN: [id number] DOB: April 22, 1932   Cancelled treatment:       Reason Eval/Treat Not Completed: Other (comment) Consulted with nurse tech, who reports both she and RN have attempted dys1/NTL textures with pt. Pt has not demonstrated oral prep or transit, but holds all presentations orally. Suction is set up in pt room, and has been used to facilitate oral clearing. Note Palliative Care following pt. Will continue efforts. SLP available for education. Unfortunately, however, significant improvement is unlikely.  Celia B. Quentin Ore [facility], CCC-SLP Speech Language Pathologist [phone]   Shonna Chock 09/14/2018, 3:28 PM

## 2018-09-14 NOTE — Care Management Important Message (Signed)
Important Message  Patient Details IM Letter given to Rhea Pink SW to present to the Patient Name: [name] MRN: [id number] Date of Birth: 1932-07-25   Medicare Important Message Given:  Yes     Kerin Salen 09/14/2018, 1:55 PM

## 2018-09-15 DIAGNOSIS — N179 Acute kidney failure, unspecified: Secondary | ICD-10-CM

## 2018-09-15 DIAGNOSIS — Z515 Encounter for palliative care: Secondary | ICD-10-CM

## 2018-09-15 DIAGNOSIS — R0600 Dyspnea, unspecified: Secondary | ICD-10-CM

## 2018-09-15 LAB — BASIC METABOLIC PANEL
Anion gap: 11 (ref 5–15)
BUN: 99 mg/dL — ABNORMAL HIGH (ref 8–23)
CO2: 22 mmol/L (ref 22–32)
Calcium: 7.6 mg/dL — ABNORMAL LOW (ref 8.9–10.3)
Chloride: 115 mmol/L — ABNORMAL HIGH (ref 98–111)
Creatinine, Ser: 3.38 mg/dL — ABNORMAL HIGH (ref 0.61–1.24)
GFR calc Af Amer: 18 mL/min — ABNORMAL LOW (ref >60)
GFR calc non Af Amer: 16 mL/min — ABNORMAL LOW (ref >60)
Glucose, Bld: 264 mg/dL — ABNORMAL HIGH (ref 70–99)
Potassium: 3 mmol/L — ABNORMAL LOW (ref 3.5–5.1)
Sodium: 148 mmol/L — ABNORMAL HIGH (ref 135–145)

## 2018-09-15 LAB — CBC
HCT: 27.6 % — ABNORMAL LOW (ref 39.0–52.0)
Hemoglobin: 8.4 g/dL — ABNORMAL LOW (ref 13.0–17.0)
MCH: 30.7 pg (ref 26.0–34.0)
MCHC: 30.4 g/dL (ref 30.0–36.0)
MCV: 100.7 fL — ABNORMAL HIGH (ref 80.0–100.0)
Platelets: 114 10*3/uL — ABNORMAL LOW (ref 150–400)
RBC: 2.74 MIL/uL — ABNORMAL LOW (ref 4.22–5.81)
RDW: 15.3 % (ref 11.5–15.5)
WBC: 9.7 10*3/uL (ref 4.0–10.5)
nRBC: 0 % (ref 0.0–0.2)

## 2018-09-15 LAB — GLUCOSE, CAPILLARY
Glucose-Capillary: 219 mg/dL — ABNORMAL HIGH (ref 70–99)
Glucose-Capillary: 218 mg/dL — ABNORMAL HIGH (ref 70–99)
Glucose-Capillary: 204 mg/dL — ABNORMAL HIGH (ref 70–99)

## 2018-09-15 MED ORDER — VITAMIN B-1 100 MG PO TABS: 100.0000 mg | ORAL_TABLET | Freq: Every day | ORAL | 0 refills | Status: AC

## 2018-09-15 MED ORDER — ENOXAPARIN SODIUM 60 MG/0.6ML ~~LOC~~ SOLN: 1.0000 mg/kg | SUBCUTANEOUS | 0 refills | Status: AC

## 2018-09-15 MED ORDER — ADULT MULTIVITAMIN W/MINERALS CH: 1.0000 | ORAL_TABLET | Freq: Every day | ORAL | 0 refills | Status: AC

## 2018-09-15 NOTE — Progress Notes (Signed)
Daily Progress Note   Patient Name: [name]       Date: 09/15/2018 DOB: 1932/05/10  Age: 85 y.o. MRN#: [id number] Attending Physician: Tawni Millers Primary Care Physician: Chipper Herb, MD Admit Date: 09/09/2018  Reason for Consultation/Follow-up: Establishing goals of care  Subjective: I saw and examined [name] today.  He remains frail and cachectic and appears weak.   Patient resting in bed with eyes open, does not track me in the room, has shallow breathing, he doesn't verbalize.   No family at bedside.    Length of Stay: 6  Current Medications: Scheduled Meds:  . enoxaparin (LOVENOX) injection  1 mg/kg Subcutaneous Q24H  . feeding supplement (ENSURE ENLIVE)  237 mL Oral TID BM  . hydrocortisone sod succinate (SOLU-CORTEF) inj  100 mg Intravenous Q8H  . mouth rinse  15 mL Mouth Rinse BID  . multivitamin with minerals  1 tablet Oral Daily  . sodium chloride flush  3 mL Intravenous Q12H    Continuous Infusions: .  ceFAZolin (ANCEF) IV Stopped (09/14/18 2330)  . dextrose 5 % and 0.45% NaCl 75 mL/hr at 09/15/18 0253    PRN Meds: acetaminophen **OR** acetaminophen, glycopyrrolate, haloperidol lactate, HYDROmorphone (DILAUDID) injection, ondansetron **OR** ondansetron (ZOFRAN) IV, polyethylene glycol  Physical Exam         General: eyes open, resting in bed, not alert in my opinion.   Lungs: Decreased air movement, scattered rhonchi and crackles Abdomen: Soft nontender Extremity: Significant muscle wasting Skin: Warm and dry  Vital Signs: BP 115/75 (BP Location: Right Arm)   Pulse (!) 46   Temp (!) 97.4 F (36.3 C)   Resp 16   Ht 5\' 7"  (1.702 m)   Wt 63.1 kg   SpO2 100%   BMI 21.79 kg/m  SpO2: SpO2: 100 % O2 Device: O2 Device: Nasal Cannula O2  Flow Rate: O2 Flow Rate (L/min): 3 L/min  Intake/output summary:   Intake/Output Summary (Last 24 hours) at 09/15/2018 1007 Last data filed at 09/15/2018 0600 Gross per 24 hour  Intake 3024.5 ml  Output 375 ml  Net 2649.5 ml   LBM: Last BM Date: 09/12/18 Baseline Weight: Weight: 45.4 kg Most recent weight: Weight: 63.1 kg       Palliative Assessment/Data:      Patient Active Problem List  Diagnosis Date Noted  . Protein-calorie malnutrition, severe 09/10/2018  . Severe sepsis ([place]) 09/09/2018  . Acute renal failure superimposed on stage 3 chronic kidney disease ([place]) 09/09/2018  . Chronic systolic CHF (congestive heart failure) ([place]) 09/09/2018  . Pressure injury of skin 09/09/2018  . Dehydration   . Acute GI bleeding 07/15/2018  . GI bleed 07/15/2018  . Coronary artery disease involving native coronary artery of native heart without angina pectoris 07/08/2018  . CKD (chronic kidney disease), stage III ([place]) 07/08/2018  . Educated About Covid-19 Virus Infection 07/08/2018  . NSTEMI (non-ST elevated myocardial infarction) ([place]) 05/13/2018  . Sepsis due to Staphylococcus ([place]) 05/13/2018  . PICC (peripherally inserted central catheter) in place 05/12/2018  . Demand ischemia of myocardium ([place]) 04/20/2018  . Bacteremia 04/20/2018  . AMS (altered mental status) 04/18/2018  . Vomiting 04/16/2018  . Type 2 diabetes mellitus ([place]) 04/16/2018  . Cerebrovascular accident (CVA) ([place]) 02/11/2018  . Dementia without behavioral disturbance ([place]) 01/27/2018  . Falls frequently 01/27/2018  . DNR (do not resuscitate) 01/27/2018  . Diabetes mellitus due to underlying condition with diabetic autonomic neuropathy, without long-term current use of insulin ([place]) 01/21/2018  . Depression 12/26/2017  . Syncope   . Syncope and collapse   . Fall   . Microcytic anemia 12/25/2017  . Dyslipidemia 10/15/2017  . Leg swelling 10/15/2017  . Palpitations 08/21/2016  . HTN (hypertension)  08/21/2016  . Carotid bruit 08/21/2016  . H/O aortic valve replacement 08/21/2016  . Anemia, iron deficiency 04/11/2014  . Orthostatic hypotension 06/22/2012  . Dizzy 06/22/2012  . Precordial pain 09/18/2011  . Unstable angina ([place]) 07/24/2011  . Hx of CABG and AVR 01/30/2011  . COLONIC POLYPS 06/23/2008  . Hyperlipidemia LDL goal <70 06/23/2008  . Aortic valve disorder 06/23/2008  . BENIGN PROSTATIC HYPERTROPHY, HX OF 06/23/2008    Palliative Care Assessment & Plan   Patient Profile:  85 y.o. male  with past medical history of dementia, CAD, CHF, type 2 DM, CKD admitted on 09/09/2018 with sepsis, oliguric AKI, hypotension, and worsening clinical status.  Family is at the bedside.  Palliative consulted for [place] and family support.   Assessment: Patient Active Problem List   Diagnosis Date Noted  . Protein-calorie malnutrition, severe 09/10/2018  . Severe sepsis ([place]) 09/09/2018  . Acute renal failure superimposed on stage 3 chronic kidney disease ([place]) 09/09/2018  . Chronic systolic CHF (congestive heart failure) ([place]) 09/09/2018  . Pressure injury of skin 09/09/2018  . Dehydration   . Acute GI bleeding 07/15/2018  . GI bleed 07/15/2018  . Coronary artery disease involving native coronary artery of native heart without angina pectoris 07/08/2018  . CKD (chronic kidney disease), stage III ([place]) 07/08/2018  . Educated About Covid-19 Virus Infection 07/08/2018  . NSTEMI (non-ST elevated myocardial infarction) ([place]) 05/13/2018  . Sepsis due to Staphylococcus ([place]) 05/13/2018  . PICC (peripherally inserted central catheter) in place 05/12/2018  . Demand ischemia of myocardium ([place]) 04/20/2018  . Bacteremia 04/20/2018  . AMS (altered mental status) 04/18/2018  . Vomiting 04/16/2018  . Type 2 diabetes mellitus ([place]) 04/16/2018  . Cerebrovascular accident (CVA) ([place]) 02/11/2018  . Dementia without behavioral disturbance ([place]) 01/27/2018  . Falls frequently 01/27/2018  . DNR (do  not resuscitate) 01/27/2018  . Diabetes mellitus due to underlying condition with diabetic autonomic neuropathy, without long-term current use of insulin ([place]) 01/21/2018  . Depression 12/26/2017  . Syncope   . Syncope and collapse   . Fall   . Microcytic  anemia 12/25/2017  . Dyslipidemia 10/15/2017  . Leg swelling 10/15/2017  . Palpitations 08/21/2016  . HTN (hypertension) 08/21/2016  . Carotid bruit 08/21/2016  . H/O aortic valve replacement 08/21/2016  . Anemia, iron deficiency 04/11/2014  . Orthostatic hypotension 06/22/2012  . Dizzy 06/22/2012  . Precordial pain 09/18/2011  . Unstable angina ([place]) 07/24/2011  . Hx of CABG and AVR 01/30/2011  . COLONIC POLYPS 06/23/2008  . Hyperlipidemia LDL goal <70 06/23/2008  . Aortic valve disorder 06/23/2008  . BENIGN PROSTATIC HYPERTROPHY, HX OF 06/23/2008    Recommendations/Plan:  DNR/DNI  As per PMT colleague Dr Domingo Cocking on 09-14-2018:  I spoke with [name] again at length today.  [name] is not taking in any nutrition or hydration.  We discussed benefit and burdens of artificial nutrition and hydration at end of life and in advanced dementia.  At this point, [name] reports that she believes that his desire would be to focus on comfort and dignity as he approaches end of life.  She would like to work to transition him to residential hospice for end of life care.  Preference for inpatient unit in [facility]) due to family location.  Will consult social work to confirm choice and facilitate referral.  09-15-2018: The patient appears weak, not alert. Possibly approaching end of life. Would completely agree with comfort measures, residential hospice transfer plan in place, CSW consulted. Medication history noted, patient has PRN IV Dilaudid, Haldol and Robinul available for symptom management use.  Prognosis likely limited to a few number of days at this point, in my opinion.   Code Status:    Code Status Orders   (From admission, onward)         Start     Ordered   09/09/18 0536  Do not attempt resuscitation (DNR)  Continuous    Question Answer Comment  In the event of cardiac or respiratory ARREST Do not call a "code blue"   In the event of cardiac or respiratory ARREST Do not perform Intubation, CPR, defibrillation or ACLS   In the event of cardiac or respiratory ARREST Use medication by any route, position, wound care, and other measures to relive pain and suffering. May use oxygen, suction and manual treatment of airway obstruction as needed for comfort.      09/09/18 0535        Code Status History    Date Active Date Inactive Code Status Order ID Comments User Context   07/15/2018 1507 07/21/2018 2111 DNR [id number]  Heath Lark D, DO ED   07/15/2018 1505 07/15/2018 1507 Full Code [id number]  Heath Lark D, DO ED   04/18/2018 1914 04/22/2018 2013 DNR [id number]  Bethena Roys, MD ED   04/18/2018 1914 04/18/2018 1914 DNR [id number]  Bethena Roys, MD ED   04/18/2018 1522 04/18/2018 1914 DNR [id number]  Orlie Dakin, MD ED   04/18/2018 1244 04/18/2018 1522 DNR [id number]  Orlie Dakin, MD ED   04/18/2018 1241 04/18/2018 1244 DNR [id number]  Orlie Dakin, MD ED   04/16/2018 2259 04/18/2018 0210 DNR [id number]  Shela Leff, MD ED   04/06/2018 2333 04/07/2018 1620 Full Code [id number]  Margette Fast, MD ED   01/27/2018 1811 01/27/2018 2127 DNR [id number]  Roney Jaffe, MD Inpatient   01/26/2018 2007 01/27/2018 1810 Full Code [id number]  Bethena Roys, MD Inpatient   12/25/2017 2133 12/28/2017 1533 Full Code [id number]  Shela Leff, MD Inpatient   Advance Care Planning Activity  Prognosis:  < 2 weeks- His condition remains critical and he is not taking in any oral nutrition or hydration.  With a focus on comfort, his prognosis is < 2 weeks and he would be well served by residential hospice placement if it can be arranged.  Discharge Planning:  Hospice facility    Care plan was discussed with staff  Thank you for allowing the Palliative Medicine Team to assist in the care of this patient.   Time In: 8 Time Out: 8.25 Total Time 25 Prolonged Time Billed No      Greater than 50%  of this time was spent counseling and coordinating care related to the above assessment and plan.  Loistine Chance, MD [phone] Please contact Palliative Medicine Team phone at [phone] for questions and concerns.

## 2018-09-15 NOTE — Progress Notes (Signed)
Pt discharged to [facility] today per Dr. Cathlean Sauer. Pt's IV site D/C'd and WDL. Pt's VSS. Report called to [facility], accepting nurse. Verbalized understanding. Pt left floor via stretcher accompanied by PTAR.

## 2018-09-15 NOTE — TOC Progression Note (Signed)
Transition of Care [facility]) - Progression Note    Patient Details  Name: [name] MRN: [id number] Date of Birth: 07-26-1932  Transition of Care [facility]) CM/SW [place], [place] Phone Number: [phone] 09/15/2018, 9:45 AM  Clinical Narrative:   Spoke with pt's daughter discussing residential hospice recommendation- she states family is requesting referral to [facility] for pt. Made referral and will follow to assist          Expected Discharge Plan and Services    hospice facility       Expected Discharge Date: (unknown)                                     Social Determinants of Health (SDOH) Interventions    Readmission Risk Interventions Readmission Risk Prevention Plan 09/11/2018 07/17/2018  Transportation Screening Complete Complete  Medication Review Press photographer) Complete -  PCP or Specialist appointment within 3-5 days of discharge Not Complete -  PCP/Specialist Appt Not Complete comments from [facility], not ready for d/c -  [place] or Home Care Consult Complete -  SW Recovery Care/Counseling Consult Complete -  Palliative Care Screening Complete Not Applicable  Skilled Nursing Facility Complete -  Some recent data might be hidden

## 2018-09-15 NOTE — TOC Transition Note (Signed)
Transition of Care [facility]) - CM/SW Discharge Note   Patient Details  Name: [name] MRN: [id number] Date of Birth: 06/14/1932  [facility]) CM/SW Contact:  Nila Nephew, LCSW Phone Number: [phone] 09/15/2018, 1:32 PM   Clinical Narrative:   Pt admitting to [facility] report [phone]. Arranged PTAR transportation.     Patient Goals and CMS Choice    comfort care  [facility]   Discharge Placement               hospice facility          Readmission Risk Interventions Readmission Risk Prevention Plan 09/11/2018 07/17/2018  Transportation Screening Complete Complete  Medication Review Press photographer) Complete -  PCP or Specialist appointment within 3-5 days of discharge Not Complete -  PCP/Specialist Appt Not Complete comments from [facility], not ready for d/c -  [place] or Home Care Consult Complete -  SW Recovery Care/Counseling Consult Complete -  Palliative Care Screening Complete Not Applicable  Skilled Nursing Facility Complete -  Some recent data might be hidden

## 2018-09-15 NOTE — Discharge Summary (Addendum)
Physician Discharge Summary  [name] EGB:[id number] DOB: October 10, 1932 DOA: 09/09/2018  PCP: Chipper Herb, MD  Admit date: 09/09/2018 Discharge date: 09/15/2018  Admitted From: SNF  Disposition:  Residential hospice   Recommendations for Outpatient Follow-up and new medication changes:  1. Continue to taper steroids as tolerated 2. Continue anticoagulation with apixaban for extensive left lower extremity deep vein thrombosis.   Home Health: na   Equipment/Devices: na    Discharge Condition: stable  CODE STATUS: DNR   Diet recommendation: Regular as tolerated   Diet recommendations: Dysphagia 1 (puree);Nectar-thick liquid Liquids provided via: Cup Medication Administration: Crushed with puree Supervision: Full supervision/cueing for compensatory strategies Compensations: Small sips/bites;Minimize environmental distractions;Slow rate Postural Changes and/or Swallow Maneuvers: Seated upright 90 degrees;Upright 30-60 min after meal   Brief/Interim Summary: 85 year old male who presented with altered mentation. He does have significant past medical history for dementia,systolic heart failure, type 2 diabetes mellitus and stage III chronic kidney disease. He was found unresponsive at skilled nursing facility, apparently he complained of not feeling well in general for last several days, decreased p.o. intake.He was found unresponsive and hypotensive,EMS brought him to the hospital. On his initial physical examination his blood pressure was 96/66, heart rate 115, respiratory rate 23, temperature 36 C, oxygen saturation 100% on supplemental 02, he was cachectic, dry mucous membranes, coarse breath sounds bilaterally, heart S1-S2 present rhythm, the abdomen was soft nontender, no lower extremity edema, he was lethargic, opening his eyes spontaneously answering only to yes/no questions intermittently. Sodium 147, potassium 5.3, chloride 112, bicarb 17, glucose 176, BUN 111, creatinine  4.9, AST 26, ALT 18, lactic acid 2.6(venous),white count 13.2, hemoglobin 11.3, hematocrit 35.9, platelets 219.SARS COVID-19 was negative.Urinalysis negative for infection. His chest radiograph had left rotation, no infiltrates. EKG 122 bpm, with left axis deviation, sinus rhythm, left anterior fascicular block, normal intervals, poor R wave progression, no ST segment or T wave changes.  Patient was admitted to the hospital with a working diagnosis of sepsis.  Developed shock, refractive to IV fluids and was started on low dose norepinephrine per peripheral vascular access, on 09/10/18.  Discontinue norepinephrine drip on 09/13/18, diet advance to dysphagia 1 and transferred to telemetry.   Patient continued to have very poor oral intake, severely deconditioned, palliative care services were consulted.  Considering his very poor prognosis and quality of life, his family decided to proceed with palliative care services at a residential hospice.  1.  Septic shock with coag negative staph bacteremia.  Patient was admitted to the intensive care unit, he did not respond to aggressive fluid resuscitation and required vasopressor therapy with norepinephrine that was administered via peripheral line.  No source of infection was identified, he had 1 out of 3 bottles positive for coag negative staph (questionable contamination).  Considering patient's critical condition he was treated with IV cefazolin with good toleration.  After 3 days on norepinephrine he regained hemodynamic stability.  Further work-up with echocardiography showed a preserved LV systolic function.  Antibiotic therapy will be discontinued at discharge since no source of infection found.   2.  Oliguric acute kidney injury on chronic kidney disease stage III, complicated by uremia, anion gap metabolic acidosis, hyperkalemia and hypernatremia.  Patient was severely hypovolemic, he received volume resuscitation with different types of  intravenous fluids including normal saline, lactated Ringers and hypotonic saline.  He is total fluid balance by the end of his hospitalization was +9078 mL.  His discharge creatinine is 3.6, sodium 140, potassium 3.3,  serum bicarbonate 22.  Will recommend to continue encourage p.o. intake.  3.  Metabolic encephalopathy in the setting of severe calorie protein malnutrition.  He received hemodynamic support along with IV multivitamins including thiamine. He had mild improvement in his mentation, but still very poorly responsive, very weak and deconditioned.  Apparently he stopped eating about 2 weeks prior to hospitalization.  Continue nutritional supplements at discharge, including multivitamins and thiamine.   4.  Pressure ulcers, present on admission, unable to stage, lesions in his back, coccyx and heels.  Patient received local wound care, patient nonambulatory, very poor prognosis.  5.  Extensive left lower extremity deep vein thrombosis.  Lower extremity ultrasonography was performed.  Ultrasound doppler positive for acute deep vein thrombosis involving the left common femoral vein, left femoral vein, left proximal profunda vein, left popliteal vein and left posterior tibial veins  Patient was initially treated with IV heparin, unable to monitor levels, he was transitioned to enoxaparin, per pharmacy protocol with good toleration.  At this point he has responded well to anticoagulation, no bleeding, considering extensive deep vein thrombosis with a consequent edema and possible pain will recommend to continue anticoagulation as tolerated.  6. Depression. Continue mirtazapine, escitalopram and Depakote.  7. Systolic heart failure. Stable with no signs of exacerbation.   Discharge Diagnoses:  Principal Problem:   Severe sepsis ([place]) Active Problems:   Hx of CABG and AVR   Dementia without behavioral disturbance (HCC)   Acute renal failure superimposed on stage 3 chronic kidney disease  (HCC)   Chronic systolic CHF (congestive heart failure) (HCC)   Pressure injury of skin   Protein-calorie malnutrition, severe   Acute renal failure ([place])   Dyspnea   Dying care    Discharge Instructions   Allergies as of 09/15/2018   No Known Allergies     Medication List    STOP taking these medications   aspirin EC 81 MG tablet   furosemide 20 MG tablet Commonly known as: LASIX   metFORMIN 500 MG tablet Commonly known as: GLUCOPHAGE   nitroGLYCERIN 0.4 MG SL tablet Commonly known as: NITROSTAT   risperiDONE 0.25 MG tablet Commonly known as: RISPERDAL   rosuvastatin 40 MG tablet Commonly known as: CRESTOR     TAKE these medications   Accu-Chek Softclix Lancets lancets Use as instructed   acetaminophen 650 MG CR tablet Commonly known as: TYLENOL Take 650 mg by mouth every 8 (eight) hours. What changed: Another medication with the same name was removed. Continue taking this medication, and follow the directions you see here.   Dermacloud Crea Apply 1 application topically 2 (two) times a day.   divalproex 125 MG DR tablet Commonly known as: DEPAKOTE Take 125 mg by mouth 2 (two) times daily.   enoxaparin 60 MG/0.6ML injection Commonly known as: LOVENOX Inject 0.6 mLs (60 mg total) into the skin daily for 30 days. Start taking on: September 16, 2018   escitalopram 5 MG tablet Commonly known as: LEXAPRO TAKE 1 TABLET ONCE DAILY IN THE EVENING What changed: See the new instructions.   feeding supplement (GLUCERNA SHAKE) Liqd Take 237 mLs by mouth 3 (three) times daily between meals.   glucose blood test strip CHECK BLOOD SUGAR UP TO 4 TIMES A DAY   mirtazapine 7.5 MG tablet Commonly known as: REMERON Take 7.5 mg by mouth at bedtime.   multivitamin with minerals Tabs tablet Take 1 tablet by mouth daily for 30 days. Start taking on: September 16, 2018  pantoprazole 40 MG tablet Commonly known as: PROTONIX TAKE (1) TABLET TWICE A DAY BEFOR A MEAL What  changed: See the new instructions.   polyethylene glycol 17 g packet Commonly known as: MIRALAX / GLYCOLAX Take 17 g by mouth daily.   tamsulosin 0.4 MG Caps capsule Commonly known as: FLOMAX TAKE (1) CAPSULE DAILY What changed: See the new instructions.   thiamine 100 MG tablet Commonly known as: VITAMIN B-1 Take 1 tablet (100 mg total) by mouth daily for 30 days.       No Known Allergies  Consultations:  Palliative care.    Procedures/Studies: Dg Chest 1 View  Result Date: 09/09/2018 CLINICAL DATA:  Dyspnea EXAM: CHEST  1 VIEW COMPARISON:  04/18/2018 FINDINGS: The heart size and mediastinal contours are within normal limits. Prior CABG. Both lungs are clear. The visualized skeletal structures are unremarkable. IMPRESSION: No active disease. Electronically Signed   By: Kathreen Devoid   On: 09/09/2018 17:52   US Abdomen Complete  Result Date: 09/09/2018 CLINICAL DATA:  Sepsis.  Acute renal failure. EXAM: ABDOMEN ULTRASOUND COMPLETE COMPARISON:  None. FINDINGS: Gallbladder: A 7.4 mm polyp is seen in the gallbladder. The gallbladder wall measures 3.4 mm. No stones. There is some sludge in the gallbladder. No pericholecystic fluid or Murphy's sign. Common bile duct: Diameter: 2.8 mm Liver: Increased echogenicity with no focal mass. Portal vein is patent on color Doppler imaging with normal direction of blood flow towards the liver. IVC: No abnormality visualized. Pancreas: Visualized portion unremarkable. Spleen: Not visualized. Right Kidney: Length: 10.2 cm.  No other abnormalities. Left Kidney: Length: 15.6 cm. Contains 2 cysts with the largest measuring 9 mm. Abdominal aorta: Atherosclerosis.  No aneurysm. Other findings: None. IMPRESSION: 1. There is sludge in the gallbladder in addition to mild wall thickening. No stones, Murphy's sign, or pericholecystic fluid. If there is concern for acute cholecystitis, recommend a HIDA scan. 2. There is a 7.4 mm polyp in the gallbladder. Recommend  a follow-up ultrasound in 6 months. 3. Hepatic steatosis. 4. 2 cysts in the left kidney. 5. Atherosclerosis in the nonaneurysmal aorta. Electronically Signed   By: Dorise Bullion III M.D   On: 09/09/2018 09:57   Dg Chest Port 1 View  Result Date: 09/09/2018 CLINICAL DATA:  Sepsis EXAM: PORTABLE CHEST 1 VIEW COMPARISON:  04/18/2018 FINDINGS: Prior CABG and valve replacement. Heart is normal size. Lungs clear. No effusions or edema. No acute bony abnormality. Study limited by rotation. IMPRESSION: No visible acute cardiopulmonary process. Limited study by rotation. Electronically Signed   By: Rolm Baptise M.D.   On: 09/09/2018 01:30   Vas [place] Lower Extremity Venous (dvt)  Result Date: 09/11/2018  Lower Venous Study Indications: Edema, and sepsis, AMS.  Limitations: Patient position, peristalsis. Comparison Study: no prior Performing Technologist: June Leap RDMS, RVT  Examination Guidelines: A complete evaluation includes B-mode imaging, spectral Doppler, color Doppler, and power Doppler as needed of all accessible portions of each vessel. Bilateral testing is considered an integral part of a complete examination. Limited examinations for reoccurring indications may be performed as noted.  +----------+---------------+---------+-----------+----------+--------------+ RIGHT     CompressibilityPhasicitySpontaneityPropertiesSummary        +----------+---------------+---------+-----------+----------+--------------+ CFV       Full                                                        +----------+---------------+---------+-----------+----------+--------------+  SFJ       Full                                                        +----------+---------------+---------+-----------+----------+--------------+ FV Prox   Full                                                        +----------+---------------+---------+-----------+----------+--------------+ FV Mid    Full                                                         +----------+---------------+---------+-----------+----------+--------------+ FV Distal Full                                                        +----------+---------------+---------+-----------+----------+--------------+ PFV       Full                                                        +----------+---------------+---------+-----------+----------+--------------+ POP       Full                                                        +----------+---------------+---------+-----------+----------+--------------+ PTV                                                    Not visualized +----------+---------------+---------+-----------+----------+--------------+ PERO                                                   Not visualized +----------+---------------+---------+-----------+----------+--------------+ Ext iliac Full           Yes      Yes                                 +----------+---------------+---------+-----------+----------+--------------+ Comm iliac               Yes      Yes                                 +----------+---------------+---------+-----------+----------+--------------+   +----------+---------------+---------+-----------+----------+-------+ LEFT  CompressibilityPhasicitySpontaneityPropertiesSummary +----------+---------------+---------+-----------+----------+-------+ CFV       None           No       No                           +----------+---------------+---------+-----------+----------+-------+ SFJ       None                                                 +----------+---------------+---------+-----------+----------+-------+ FV Prox   None                                                 +----------+---------------+---------+-----------+----------+-------+ FV Mid    None                                                  +----------+---------------+---------+-----------+----------+-------+ FV Distal None                                                 +----------+---------------+---------+-----------+----------+-------+ PFV       None                                                 +----------+---------------+---------+-----------+----------+-------+ POP       None           No       No                           +----------+---------------+---------+-----------+----------+-------+ PTV       None                                                 +----------+---------------+---------+-----------+----------+-------+ PERO      Full                                                 +----------+---------------+---------+-----------+----------+-------+ Ext iliac None           No       No                           +----------+---------------+---------+-----------+----------+-------+ Comm iliac               No       No                           +----------+---------------+---------+-----------+----------+-------+ Occlusive thrombosis of distal  IVC. Proximal IVC appears patent.    Summary: Right: There is no evidence of deep vein thrombosis in the lower extremity. However, portions of this examination were limited- see technologist comments above. Left: Findings consistent with acute deep vein thrombosis involving the left common femoral vein, left femoral vein, left proximal profunda vein, left popliteal vein, and left posterior tibial veins. DVT also involving the left iliac vein and extending up to distal IVC.  *See table(s) above for measurements and observations. Electronically signed by Monica Martinez MD on 09/11/2018 at 4:55:36 PM.    Final       Procedures:   Subjective: Patient continue to be poorly responsive, partially following commands, responding yes and no with his head to simple questions.   Discharge Exam: Vitals:   09/14/18 1958 09/15/18 0440  BP: (!) 94/46 115/75   Pulse: (!) 50 (!) 46  Resp: 20 16  Temp: 98.2 F (36.8 C) (!) 97.4 F (36.3 C)  SpO2: 100% 100%   Vitals:   09/14/18 1349 09/14/18 1958 09/15/18 0400 09/15/18 0440  BP: 105/60 (!) 94/46  115/75  Pulse: (!) 45 (!) 50  (!) 46  Resp: 19 20  16   Temp: 97.6 F (36.4 C) 98.2 F (36.8 C)  (!) 97.4 F (36.3 C)  TempSrc: Oral     SpO2: 100% 100%  100%  Weight:   63.1 kg   Height:        General: deconditioned, cachectic.  Neurology: Awake. Generalized weakness 2/5 all four extremities.  E ENT: no pallor, no icterus, oral mucosa moist Cardiovascular: No JVD. S1-S2 present, rhythmic, no gallops, rubs, or murmurs. No lower extremity edema. Pulmonary: positive breath sounds bilaterally, adequate air movement, no wheezing, rhonchi or rales. Gastrointestinal. Abdomen scaphoid with no organomegaly, non tender, no rebound or guarding Skin. No rashes Musculoskeletal: no joint deformities   The results of significant diagnostics from this hospitalization (including imaging, microbiology, ancillary and laboratory) are listed below for reference.     Microbiology: Recent Results (from the past 240 hour(s))  Blood Culture (routine x 2)     Status: Abnormal   Collection Time: 09/09/18  1:10 AM   Specimen: BLOOD  Result Value Ref Range Status   Specimen Description   Final    BLOOD RIGHT ANTECUBITAL Performed at [facility] [address] [place], [place] [address]    Special Requests   Final    BOTTLES DRAWN AEROBIC AND ANAEROBIC Blood Culture adequate volume Performed at [facility] [address], [place], [place] [address]    Culture  Setup Time   Final    GRAM POSITIVE COCCI IN BOTH AEROBIC AND ANAEROBIC BOTTLES CRITICAL RESULT CALLED TO, READ BACK BY AND VERIFIED WITH: Shelda Jakes PharmD 10:55 09/10/18 ([email or web address]) Performed at [facility], [place] [address] [place], [place] [address]    Culture STAPHYLOCOCCUS EPIDERMIDIS (A)  Final    Report Status 09/12/2018 FINAL  Final   Organism ID, Bacteria STAPHYLOCOCCUS EPIDERMIDIS  Final      Susceptibility   Staphylococcus epidermidis - MIC*    CIPROFLOXACIN <=0.5 SENSITIVE Sensitive     ERYTHROMYCIN >=8 RESISTANT Resistant     GENTAMICIN <=0.5 SENSITIVE Sensitive     OXACILLIN <=0.25 SENSITIVE Sensitive     TETRACYCLINE <=1 SENSITIVE Sensitive     VANCOMYCIN 1 SENSITIVE Sensitive     TRIMETH/SULFA <=10 SENSITIVE Sensitive     CLINDAMYCIN <=0.25 SENSITIVE Sensitive     RIFAMPIN <=0.5 SENSITIVE Sensitive  Inducible Clindamycin NEGATIVE Sensitive     * STAPHYLOCOCCUS EPIDERMIDIS  Blood Culture ID Panel (Reflexed)     Status: Abnormal   Collection Time: 09/09/18  1:10 AM  Result Value Ref Range Status   Enterococcus species NOT DETECTED NOT DETECTED Final   Listeria monocytogenes NOT DETECTED NOT DETECTED Final   Staphylococcus species DETECTED (A) NOT DETECTED Final    Comment: Methicillin (oxacillin) susceptible coagulase negative staphylococcus. Possible blood culture contaminant (unless isolated from more than one blood culture draw or clinical case suggests pathogenicity). No antibiotic treatment is indicated for blood  culture contaminants. CRITICAL RESULT CALLED TO, READ BACK BY AND VERIFIED WITH: Shelda Jakes PharmD 10:55 09/10/18 ([email or web address])    Staphylococcus aureus (BCID) NOT DETECTED NOT DETECTED Final   Methicillin resistance NOT DETECTED NOT DETECTED Final   Streptococcus species NOT DETECTED NOT DETECTED Final   Streptococcus agalactiae NOT DETECTED NOT DETECTED Final   Streptococcus pneumoniae NOT DETECTED NOT DETECTED Final   Streptococcus pyogenes NOT DETECTED NOT DETECTED Final   Acinetobacter baumannii NOT DETECTED NOT DETECTED Final   Enterobacteriaceae species NOT DETECTED NOT DETECTED Final   Enterobacter cloacae complex NOT DETECTED NOT DETECTED Final   Escherichia coli NOT DETECTED NOT DETECTED Final   Klebsiella oxytoca NOT DETECTED NOT DETECTED  Final   Klebsiella pneumoniae NOT DETECTED NOT DETECTED Final   Proteus species NOT DETECTED NOT DETECTED Final   Serratia marcescens NOT DETECTED NOT DETECTED Final   Haemophilus influenzae NOT DETECTED NOT DETECTED Final   Neisseria meningitidis NOT DETECTED NOT DETECTED Final   Pseudomonas aeruginosa NOT DETECTED NOT DETECTED Final   Candida albicans NOT DETECTED NOT DETECTED Final   Candida glabrata NOT DETECTED NOT DETECTED Final   Candida krusei NOT DETECTED NOT DETECTED Final   Candida parapsilosis NOT DETECTED NOT DETECTED Final   Candida tropicalis NOT DETECTED NOT DETECTED Final    Comment: Performed at [facility], [address], [place], [place] [address]  SARS Coronavirus 2 (CEPHEID - Performed in [facility] hospital lab), Hosp Order     Status: None   Collection Time: 09/09/18  1:11 AM   Specimen: Nasopharyngeal Swab  Result Value Ref Range Status   SARS Coronavirus 2 NEGATIVE NEGATIVE Final    Comment: (NOTE) If result is NEGATIVE SARS-CoV-2 target nucleic acids are NOT DETECTED. The SARS-CoV-2 RNA is generally detectable in upper and lower  respiratory specimens during the acute phase of infection. The lowest  concentration of SARS-CoV-2 viral copies this assay can detect is 250  copies / mL. A negative result does not preclude SARS-CoV-2 infection  and should not be used as the sole basis for treatment or other  patient management decisions.  A negative result may occur with  improper specimen collection / handling, submission of specimen other  than nasopharyngeal swab, presence of viral mutation(s) within the  areas targeted by this assay, and inadequate number of viral copies  (<250 copies / mL). A negative result must be combined with clinical  observations, patient history, and epidemiological information. If result is POSITIVE SARS-CoV-2 target nucleic acids are DETECTED. The SARS-CoV-2 RNA is generally detectable in upper and lower  respiratory  specimens dur ing the acute phase of infection.  Positive  results are indicative of active infection with SARS-CoV-2.  Clinical  correlation with patient history and other diagnostic information is  necessary to determine patient infection status.  Positive results do  not rule out bacterial infection or co-infection with other viruses. If result  is PRESUMPTIVE POSTIVE SARS-CoV-2 nucleic acids MAY BE PRESENT.   A presumptive positive result was obtained on the submitted specimen  and confirmed on repeat testing.  While 2019 novel coronavirus  (SARS-CoV-2) nucleic acids may be present in the submitted sample  additional confirmatory testing may be necessary for epidemiological  and / or clinical management purposes  to differentiate between  SARS-CoV-2 and other Sarbecovirus currently known to infect humans.  If clinically indicated additional testing with an alternate test  methodology [phone]) is advised. The SARS-CoV-2 RNA is generally  detectable in upper and lower respiratory sp ecimens during the acute  phase of infection. The expected result is Negative. Fact Sheet for Patients:  StrictlyIdeas.no Fact Sheet for Healthcare Providers: BankingDealers.co.za This test is not yet approved or cleared by the [place] FDA and has been authorized for detection and/or diagnosis of SARS-CoV-2 by FDA under an Emergency Use Authorization (EUA).  This EUA will remain in effect (meaning this test can be used) for the duration of the COVID-19 declaration under Section 564(b)(1) of the Act, 21 U.S.C. section 360bbb-3(b)(1), unless the authorization is terminated or revoked sooner. Performed at [facility], [place] [address], [place], [place] [address]   MRSA PCR Screening     Status: None   Collection Time: 09/09/18  5:38 AM   Specimen: Nasal Mucosa; Nasopharyngeal  Result Value Ref Range Status   MRSA by PCR NEGATIVE  NEGATIVE Final    Comment:        The GeneXpert MRSA Assay (FDA approved for NASAL specimens only), is one component of a comprehensive MRSA colonization surveillance program. It is not intended to diagnose MRSA infection nor to guide or monitor treatment for MRSA infections. Performed at [facility], [place] [address] [place], [place] [address]   Blood Culture (routine x 2)     Status: None   Collection Time: 09/09/18  6:34 AM   Specimen: BLOOD LEFT HAND  Result Value Ref Range Status   Specimen Description   Final    BLOOD LEFT HAND Performed at [facility] [address], [place], [place] [address]    Special Requests   Final    BOTTLES DRAWN AEROBIC ONLY Blood Culture results may not be optimal due to an inadequate volume of blood received in culture bottles Performed at [facility] [address] [place], [place] [address]    Culture   Final    NO GROWTH 5 DAYS Performed at [facility], [place] [address] [place], [place] [address]    Report Status 09/14/2018 FINAL  Final  Urine culture     Status: None   Collection Time: 09/09/18 12:30 PM   Specimen: Urine, Random  Result Value Ref Range Status   Specimen Description   Final    URINE, RANDOM Performed at [place] [address], [place], [place] [address]    Special Requests   Final    NONE Performed at [facility], [place] [address] [place], [place] [address]    Culture   Final    NO GROWTH Performed at [facility], [place] [address], [place],  [address]    Report Status 09/10/2018 FINAL  Final  Culture, blood (routine x 2)     Status: None (Preliminary result)   Collection Time: 09/11/18 10:50 AM   Specimen: BLOOD RIGHT HAND  Result Value Ref Range Status   Specimen Description  Final    BLOOD RIGHT HAND Performed at [facility], [place] [address] [place], [place]  [address]    Special Requests   Final    BOTTLES DRAWN AEROBIC ONLY Blood Culture results may not be optimal due to an inadequate volume of blood received in culture bottles Performed at [place] [address], [place], [place] [address]    Culture   Final    NO GROWTH 4 DAYS Performed at [facility], [place] [address] [place], [place] [address]    Report Status PENDING  Incomplete  Culture, blood (routine x 2)     Status: None (Preliminary result)   Collection Time: 09/11/18 12:11 PM   Specimen: BLOOD LEFT HAND  Result Value Ref Range Status   Specimen Description   Final    BLOOD LEFT HAND Performed at [place] [address], [place], [place] [address]    Special Requests   Final    BOTTLES DRAWN AEROBIC ONLY Blood Culture adequate volume Performed at [place] [address] [place], [place] [address]    Culture   Final    NO GROWTH 4 DAYS Performed at [facility], [place] [address] [place], [place] [address]    Report Status PENDING  Incomplete     Labs: BNP (last 3 results) Recent Labs    01/06/18 1812 04/02/18 1508 04/19/18 0205  BNP 128.0* 211.1* 466.5*   Basic Metabolic Panel: Recent Labs  Lab 09/09/18 1709 09/10/18 0256 09/11/18 0254 09/13/18 0152 09/14/18 0847  NA 148* 148* 149* 150* 149*  K 4.2 3.8 3.1* 3.7 3.3*  CL 114* 119* 116* 116* 116*  CO2 17* 19* 22 21* 22  GLUCOSE 209* 170* 260* 185* 257*  BUN 133* 126* 104* 113* 109*  CREATININE 4.73* 4.57* 4.29* 3.66* 3.66*  CALCIUM 8.2* 8.2* 8.0* 8.1* 7.9*  MG  --   --   --  2.0  --   PHOS  --   --  3.9 3.6  --    Liver Function Tests: Recent Labs  Lab 09/09/18 0110 09/09/18 0249 09/09/18 0634  AST 26  --  26  ALT 18  --  15  ALKPHOS 148*  --  125  BILITOT 2.2* 2.0* 1.7*  PROT 7.1  --  6.5  ALBUMIN 2.8*  --  2.4*   No results for input(s): LIPASE, AMYLASE in the last 168 hours. No results for input(s): AMMONIA  in the last 168 hours. CBC: Recent Labs  Lab 09/09/18 0110 09/09/18 0634 09/10/18 0256 09/11/18 0254 09/13/18 0152  WBC 13.2* 13.0* 10.4 10.8* 14.5*  NEUTROABS 9.7* 9.4* 8.2* 9.1*  --   HGB 11.3* 10.9* 9.3* 9.2* 9.1*  HCT 35.9* 35.0* 29.4* 28.4* 28.8*  MCV 96.2 96.7 96.7 95.3 95.7  PLT 219 164 163 159 204   Cardiac Enzymes: No results for input(s): CKTOTAL, CKMB, CKMBINDEX, TROPONINI in the last 168 hours. BNP: Invalid input(s): POCBNP CBG: Recent Labs  Lab 09/13/18 2102 09/14/18 0742 09/14/18 1151 09/14/18 1639 09/15/18 0751  GLUCAP 209* 214* 214* 225* 219*   D-Dimer No results for input(s): DDIMER in the last 72 hours. Hgb A1c No results for input(s): HGBA1C in the last 72 hours. Lipid Profile No results for input(s): CHOL, HDL, LDLCALC, TRIG, CHOLHDL, LDLDIRECT in the last 72 hours. Thyroid function studies No results for input(s): TSH, T4TOTAL, T3FREE, THYROIDAB in the last 72 hours.  Invalid input(s): FREET3 Anemia  work up No results for input(s): VITAMINB12, FOLATE, FERRITIN, TIBC, IRON, RETICCTPCT in the last 72 hours. Urinalysis    Component Value Date/Time   COLORURINE AMBER (A) 09/09/2018 0426   APPEARANCEUR HAZY (A) 09/09/2018 0426   APPEARANCEUR Clear 06/09/2018 0944   LABSPEC 1.018 09/09/2018 0426   PHURINE 5.0 09/09/2018 0426   GLUCOSEU NEGATIVE 09/09/2018 0426   HGBUR NEGATIVE 09/09/2018 0426   BILIRUBINUR NEGATIVE 09/09/2018 0426   BILIRUBINUR Negative 06/09/2018 0944   KETONESUR NEGATIVE 09/09/2018 0426   PROTEINUR NEGATIVE 09/09/2018 0426   UROBILINOGEN negative 08/23/2014 1030   UROBILINOGEN 0.2 08/21/2010 1150   NITRITE NEGATIVE 09/09/2018 0426   LEUKOCYTESUR NEGATIVE 09/09/2018 0426   Sepsis Labs Invalid input(s): PROCALCITONIN,  WBC,  LACTICIDVEN Microbiology Recent Results (from the past 240 hour(s))  Blood Culture (routine x 2)     Status: Abnormal   Collection Time: 09/09/18  1:10 AM   Specimen: BLOOD  Result Value Ref Range  Status   Specimen Description   Final    BLOOD RIGHT ANTECUBITAL Performed at [facility], [place] [address] [place], [place] [address]    Special Requests   Final    BOTTLES DRAWN AEROBIC AND ANAEROBIC Blood Culture adequate volume Performed at [place] [address] [place],  [address]    Culture  Setup Time   Final    GRAM POSITIVE COCCI IN BOTH AEROBIC AND ANAEROBIC BOTTLES CRITICAL RESULT CALLED TO, READ BACK BY AND VERIFIED WITH: Shelda Jakes PharmD 10:55 09/10/18 ([email or web address]) Performed at [facility], [place] [address], [place], [place] [address]    Culture STAPHYLOCOCCUS EPIDERMIDIS (A)  Final   Report Status 09/12/2018 FINAL  Final   Organism ID, Bacteria STAPHYLOCOCCUS EPIDERMIDIS  Final      Susceptibility   Staphylococcus epidermidis - MIC*    CIPROFLOXACIN <=0.5 SENSITIVE Sensitive     ERYTHROMYCIN >=8 RESISTANT Resistant     GENTAMICIN <=0.5 SENSITIVE Sensitive     OXACILLIN <=0.25 SENSITIVE Sensitive     TETRACYCLINE <=1 SENSITIVE Sensitive     VANCOMYCIN 1 SENSITIVE Sensitive     TRIMETH/SULFA <=10 SENSITIVE Sensitive     CLINDAMYCIN <=0.25 SENSITIVE Sensitive     RIFAMPIN <=0.5 SENSITIVE Sensitive     Inducible Clindamycin NEGATIVE Sensitive     * STAPHYLOCOCCUS EPIDERMIDIS  Blood Culture ID Panel (Reflexed)     Status: Abnormal   Collection Time: 09/09/18  1:10 AM  Result Value Ref Range Status   Enterococcus species NOT DETECTED NOT DETECTED Final   Listeria monocytogenes NOT DETECTED NOT DETECTED Final   Staphylococcus species DETECTED (A) NOT DETECTED Final    Comment: Methicillin (oxacillin) susceptible coagulase negative staphylococcus. Possible blood culture contaminant (unless isolated from more than one blood culture draw or clinical case suggests pathogenicity). No antibiotic treatment is indicated for blood  culture contaminants. CRITICAL RESULT CALLED TO, READ BACK BY AND VERIFIED WITH: Shelda Jakes  PharmD 10:55 09/10/18 ([email or web address])    Staphylococcus aureus (BCID) NOT DETECTED NOT DETECTED Final   Methicillin resistance NOT DETECTED NOT DETECTED Final   Streptococcus species NOT DETECTED NOT DETECTED Final   Streptococcus agalactiae NOT DETECTED NOT DETECTED Final   Streptococcus pneumoniae NOT DETECTED NOT DETECTED Final   Streptococcus pyogenes NOT DETECTED NOT DETECTED Final   Acinetobacter baumannii NOT DETECTED NOT DETECTED Final   Enterobacteriaceae species NOT DETECTED NOT DETECTED Final   Enterobacter cloacae complex NOT DETECTED NOT DETECTED Final   Escherichia coli NOT DETECTED NOT DETECTED  Final   Klebsiella oxytoca NOT DETECTED NOT DETECTED Final   Klebsiella pneumoniae NOT DETECTED NOT DETECTED Final   Proteus species NOT DETECTED NOT DETECTED Final   Serratia marcescens NOT DETECTED NOT DETECTED Final   Haemophilus influenzae NOT DETECTED NOT DETECTED Final   Neisseria meningitidis NOT DETECTED NOT DETECTED Final   Pseudomonas aeruginosa NOT DETECTED NOT DETECTED Final   Candida albicans NOT DETECTED NOT DETECTED Final   Candida glabrata NOT DETECTED NOT DETECTED Final   Candida krusei NOT DETECTED NOT DETECTED Final   Candida parapsilosis NOT DETECTED NOT DETECTED Final   Candida tropicalis NOT DETECTED NOT DETECTED Final    Comment: Performed at [facility], [place] [address] [place], [place] [address]  SARS Coronavirus 2 (CEPHEID - Performed in [facility]), Hosp Order     Status: None   Collection Time: 09/09/18  1:11 AM   Specimen: Nasopharyngeal Swab  Result Value Ref Range Status   SARS Coronavirus 2 NEGATIVE NEGATIVE Final    Comment: (NOTE) If result is NEGATIVE SARS-CoV-2 target nucleic acids are NOT DETECTED. The SARS-CoV-2 RNA is generally detectable in upper and lower  respiratory specimens during the acute phase of infection. The lowest  concentration of SARS-CoV-2 viral copies this assay can detect is 250  copies / mL. A negative  result does not preclude SARS-CoV-2 infection  and should not be used as the sole basis for treatment or other  patient management decisions.  A negative result may occur with  improper specimen collection / handling, submission of specimen other  than nasopharyngeal swab, presence of viral mutation(s) within the  areas targeted by this assay, and inadequate number of viral copies  (<250 copies / mL). A negative result must be combined with clinical  observations, patient history, and epidemiological information. If result is POSITIVE SARS-CoV-2 target nucleic acids are DETECTED. The SARS-CoV-2 RNA is generally detectable in upper and lower  respiratory specimens dur ing the acute phase of infection.  Positive  results are indicative of active infection with SARS-CoV-2.  Clinical  correlation with patient history and other diagnostic information is  necessary to determine patient infection status.  Positive results do  not rule out bacterial infection or co-infection with other viruses. If result is PRESUMPTIVE POSTIVE SARS-CoV-2 nucleic acids MAY BE PRESENT.   A presumptive positive result was obtained on the submitted specimen  and confirmed on repeat testing.  While 2019 novel coronavirus  (SARS-CoV-2) nucleic acids may be present in the submitted sample  additional confirmatory testing may be necessary for epidemiological  and / or clinical management purposes  to differentiate between  SARS-CoV-2 and other Sarbecovirus currently known to infect humans.  If clinically indicated additional testing with an alternate test  methodology [phone]) is advised. The SARS-CoV-2 RNA is generally  detectable in upper and lower respiratory sp ecimens during the acute  phase of infection. The expected result is Negative. Fact Sheet for Patients:  StrictlyIdeas.no Fact Sheet for Healthcare Providers: BankingDealers.co.za This test is not yet  approved or cleared by the [place] FDA and has been authorized for detection and/or diagnosis of SARS-CoV-2 by FDA under an Emergency Use Authorization (EUA).  This EUA will remain in effect (meaning this test can be used) for the duration of the COVID-19 declaration under Section 564(b)(1) of the Act, 21 U.S.C. section 360bbb-3(b)(1), unless the authorization is terminated or revoked sooner. Performed at [facility], [place] [address], [place],  [address]  MRSA PCR Screening     Status: None   Collection Time: 09/09/18  5:38 AM   Specimen: Nasal Mucosa; Nasopharyngeal  Result Value Ref Range Status   MRSA by PCR NEGATIVE NEGATIVE Final    Comment:        The GeneXpert MRSA Assay (FDA approved for NASAL specimens only), is one component of a comprehensive MRSA colonization surveillance program. It is not intended to diagnose MRSA infection nor to guide or monitor treatment for MRSA infections. Performed at [facility], [place] [address] [place], [place] [address]   Blood Culture (routine x 2)     Status: None   Collection Time: 09/09/18  6:34 AM   Specimen: BLOOD LEFT HAND  Result Value Ref Range Status   Specimen Description   Final    BLOOD LEFT HAND Performed at [place] [address], [place], [place] [address]    Special Requests   Final    BOTTLES DRAWN AEROBIC ONLY Blood Culture results may not be optimal due to an inadequate volume of blood received in culture bottles Performed at [place] [address], [place], [place] [address]    Culture   Final    NO GROWTH 5 DAYS Performed at [facility], [place] [address] [place], [place] [address]    Report Status 09/14/2018 FINAL  Final  Urine culture     Status: None   Collection Time: 09/09/18 12:30 PM   Specimen: Urine, Random  Result Value Ref Range Status   Specimen Description   Final    URINE,  RANDOM Performed at [place] [address], [place], [place] [address]    Special Requests   Final    NONE Performed at [facility], [place] [address], [place], [place] [address]    Culture   Final    NO GROWTH Performed at [facility], [place] [address], [place], [place] [address]    Report Status 09/10/2018 FINAL  Final  Culture, blood (routine x 2)     Status: None (Preliminary result)   Collection Time: 09/11/18 10:50 AM   Specimen: BLOOD RIGHT HAND  Result Value Ref Range Status   Specimen Description   Final    BLOOD RIGHT HAND Performed at [facility] [address], [place], [place] [address]    Special Requests   Final    BOTTLES DRAWN AEROBIC ONLY Blood Culture results may not be optimal due to an inadequate volume of blood received in culture bottles Performed at [place] [address] [place], [place] [address]    Culture   Final    NO GROWTH 4 DAYS Performed at [facility], [place] [address], [place], [place] [address]    Report Status PENDING  Incomplete  Culture, blood (routine x 2)     Status: None (Preliminary result)   Collection Time: 09/11/18 12:11 PM   Specimen: BLOOD LEFT HAND  Result Value Ref Range Status   Specimen Description   Final    BLOOD LEFT HAND Performed at [place] [address] [place], [place] [address]    Special Requests   Final    BOTTLES DRAWN AEROBIC ONLY Blood Culture adequate volume Performed at [facility] [address] [place], [place] [address]    Culture   Final    NO GROWTH 4 DAYS Performed at [facility], [place] [address] [place], [place] [address]    Report Status PENDING  Incomplete     Time coordinating discharge: 45 minutes  SIGNED:   Tawni Millers, MD  [facility] 09/15/2018, 11:38 AM

## 2018-09-16 LAB — CULTURE, BLOOD (ROUTINE X 2)
Culture: NO GROWTH
Culture: NO GROWTH
Special Requests: ADEQUATE

## 2018-09-22 DIAGNOSIS — I739 Peripheral vascular disease, unspecified: Secondary | ICD-10-CM | POA: Diagnosis not present

## 2018-09-22 DIAGNOSIS — F330 Major depressive disorder, recurrent, mild: Secondary | ICD-10-CM | POA: Diagnosis not present

## 2018-10-06 ENCOUNTER — Telehealth: Payer: Self-pay | Admitting: Family Medicine

## 2018-10-06 NOTE — Chronic Care Management (AMB) (Signed)
Chronic Care Management  ° °Note ° °10/06/2018 °Name: [name] MRN: [id number] DOB: 05/21/1932 ° °[name] is a 86 y.o. year old male who is a primary care patient of Moore, Donald W, MD. I reached out to [name] by phone today in response to a referral sent by [name]'s health plan.   ° °[name] was given information about Chronic Care Management services today including:  °1. CCM service includes personalized support from designated clinical staff supervised by his physician, including individualized plan of care and coordination with other care providers °2. 24/7 contact phone numbers for assistance for urgent and routine care needs. °3. Service will only be billed when office clinical staff spend 20 minutes or more in a month to coordinate care. °4. Only one practitioner may furnish and bill the service in a calendar month. °5. The patient may stop CCM services at any time (effective at the end of the month) by phone call to the office staff. °6. The patient will be responsible for cost sharing (co-pay) of up to 20% of the service fee (after annual deductible is met). ° °Patient's daughter did not agree to enrollment in care management services and does not wish to consider at this time. Patient is DECEASED 10/01/2018 ° °Follow up plan: °No follow up needed ° °Bernice Cicero °Care Guide • [facility] °[place]   [facility]  °[email or web address]   ??[phone]   ° ° ° °

## 2018-10-13 ENCOUNTER — Encounter: Payer: Medicare HMO | Admitting: *Deleted

## 2018-10-24 DEATH — deceased

## 2018-12-17 IMAGING — MR MR MRA HEAD W/O CM
10 of 11 series · 35 of 48 positions shown · non-contrast
Comparison: 10/31/2016

Addendum:
CLINICAL DATA: Dizziness for 2 days.

EXAM:
MRI HEAD WITHOUT CONTRAST
MRA HEAD WITHOUT CONTRAST
TECHNIQUE: Multiplanar, multiecho pulse sequences of the brain and surrounding
structures were obtained without intravenous contrast. Angiographic
images of the head were obtained using MRA technique without
contrast.

[Series 2: [id number] · sagittal · 5.0mm · 0.45mm/px · 1 of 20 slices shown]
[im 1/20]
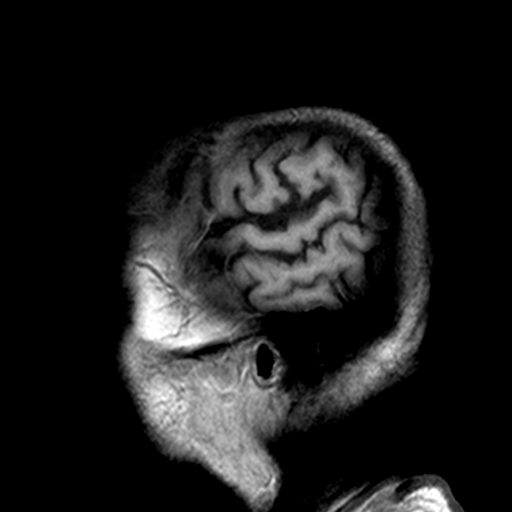

[Series 4: DWI · axial · 3.0mm · 0.82mm/px · z∈[-34,+125]mm · 4 of 55 slices shown (1 of 4)]
[im 1/55]
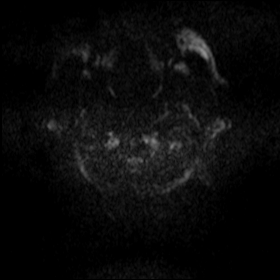
[im 19/55]
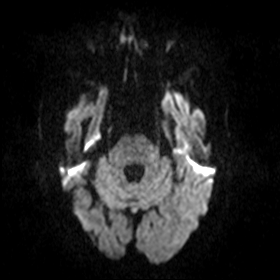
[im 37/55]
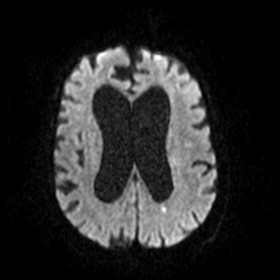
[im 55/55]
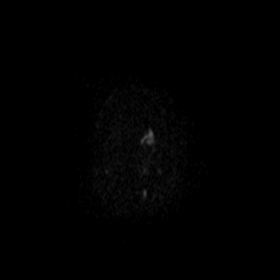

[Series 5: DWI · axial · 3.0mm · 0.82mm/px · z∈[-34,+125]mm · 4 of 55 slices shown (2 of 4)]
[im 1/55]
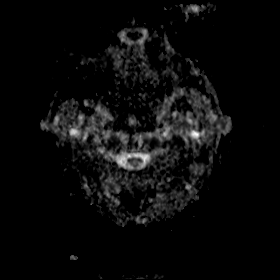
[im 19/55]
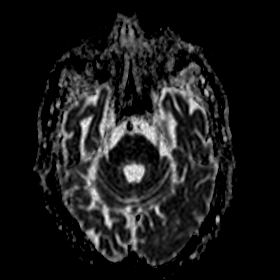
[im 37/55]
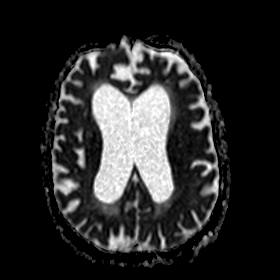
[im 55/55]
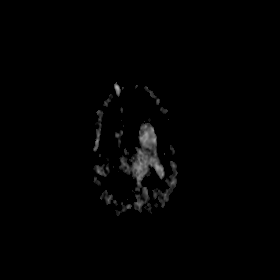

[Series 6: DWI · coronal · 5.0mm · 0.50mm/px · 3 of 34 slices shown (3 of 4)]
[im 1/34]
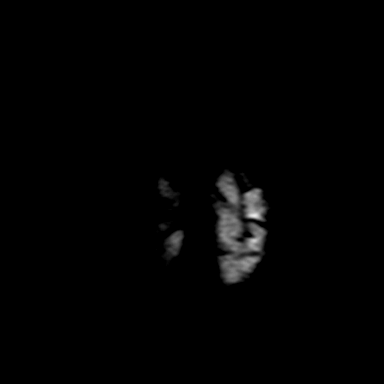
[im 17/34]
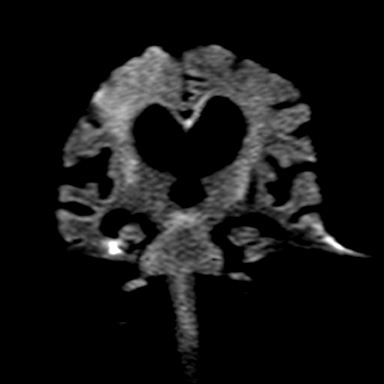
[im 34/34]
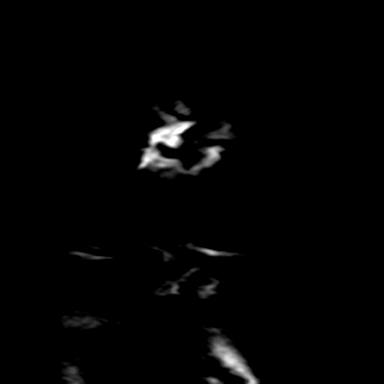

[Series 7: DWI · coronal · 5.0mm · 0.50mm/px · 3 of 34 slices shown (4 of 4)]
[im 1/34]
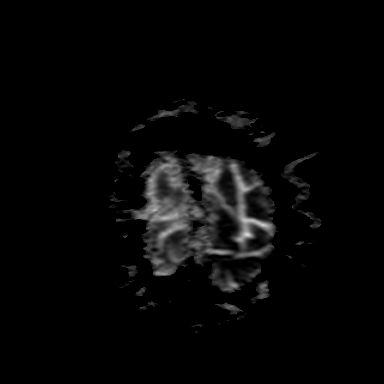
[im 17/34]
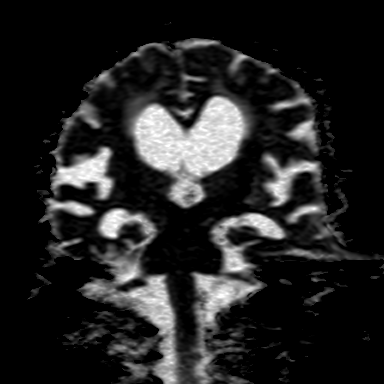
[im 34/34]
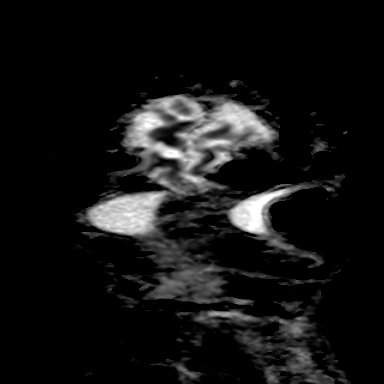

[Series 8: T2 · axial · 5.0mm · 0.75mm/px · z∈[-24,+116]mm · 2 of 23 slices shown (1 of 2)]
[im 1/23]
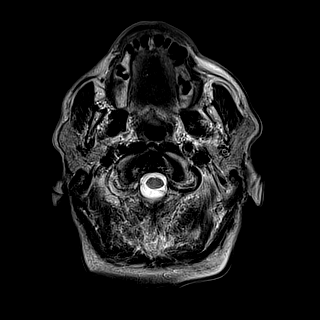
[im 23/23]
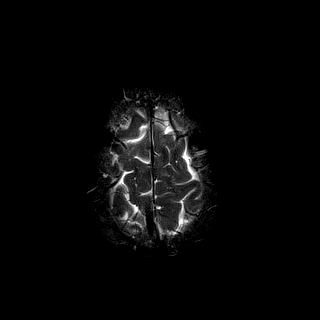

[Series 9: FLAIR · axial · 3.0mm · 0.94mm/px · z∈[-22,+114]mm · 4 of 47 slices shown]
[im 1/47]
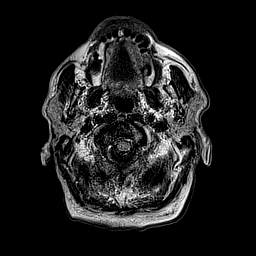
[im 16/47]
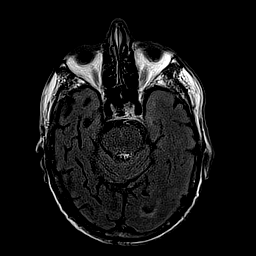
[im 31/47]
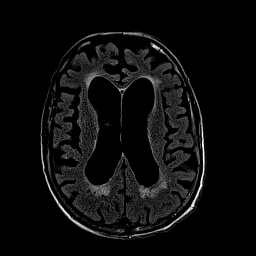
[im 47/47]
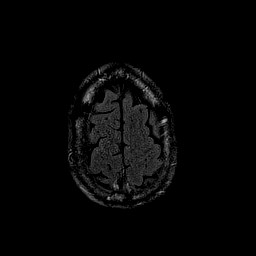

[Series 10: MRA · axial · 0.8mm · 0.38mm/px · 1 of 143 slices shown]
[im 1/143]
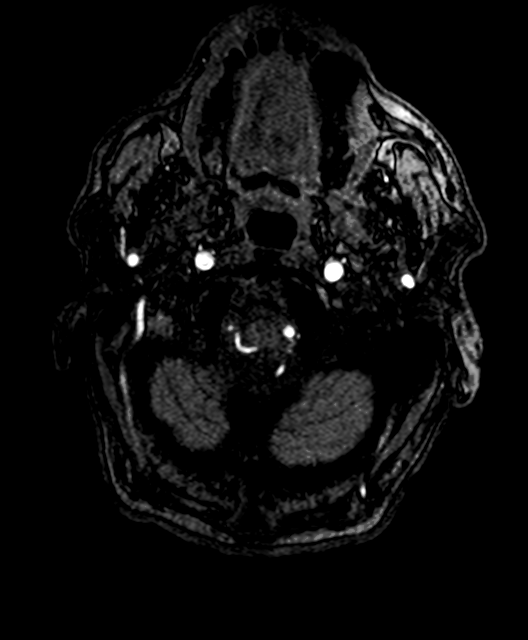

[Series 15: T1 · axial · 2.0mm · 0.47mm/px · z∈[-36,+148]mm · 8 of 95 slices shown]
[im 1/95]
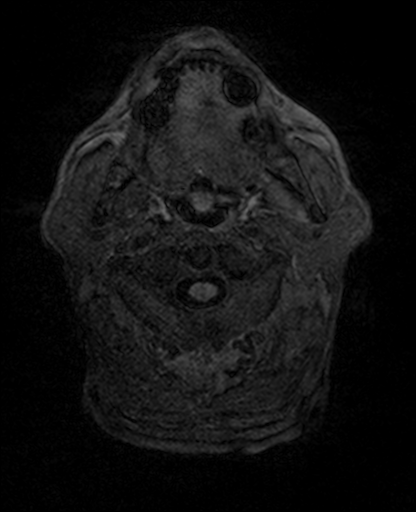
[im 14/95]
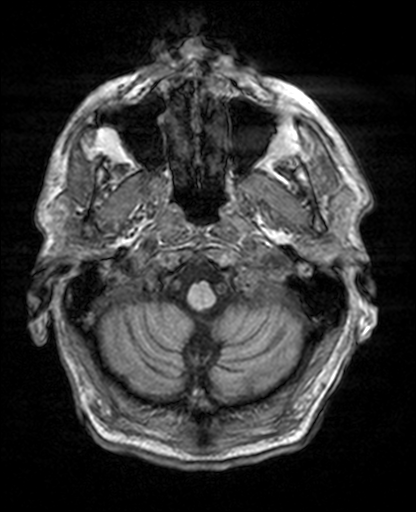
[im 27/95]
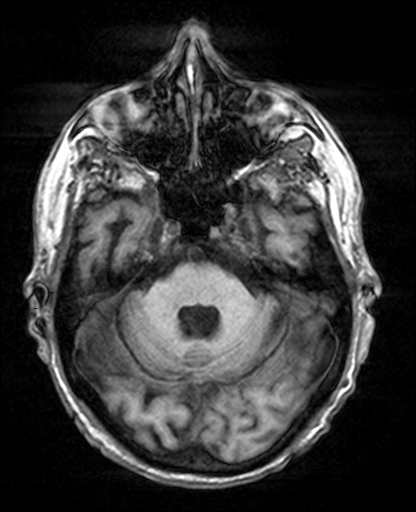
[im 41/95]
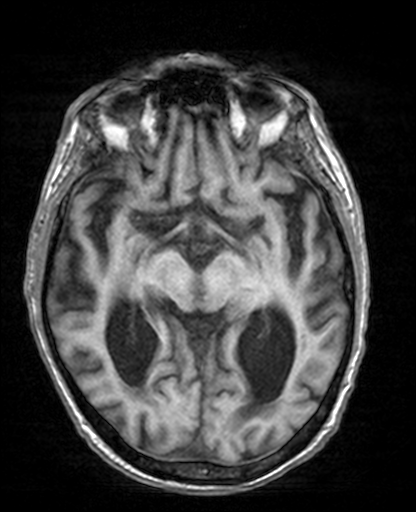
[im 54/95]
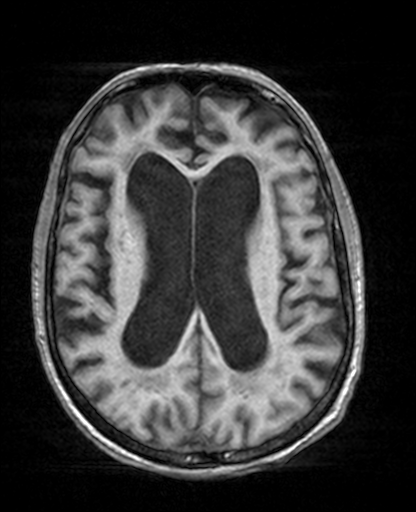
[im 68/95]
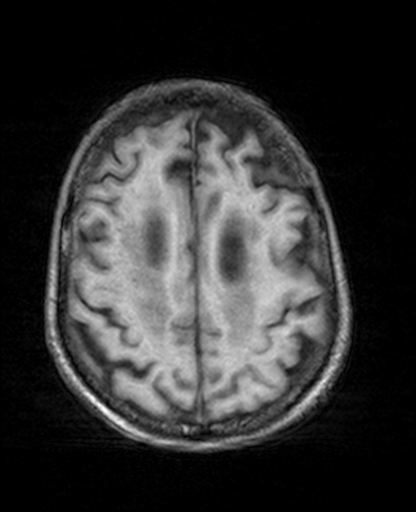
[im 81/95]
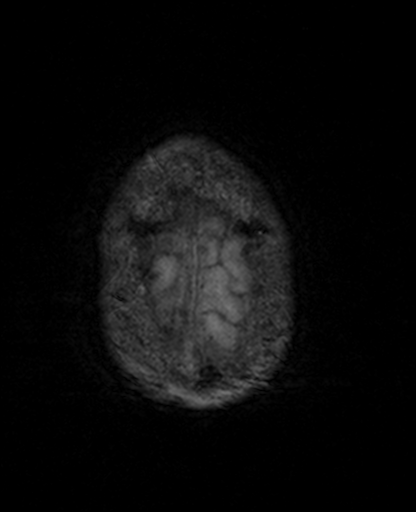
[im 95/95]
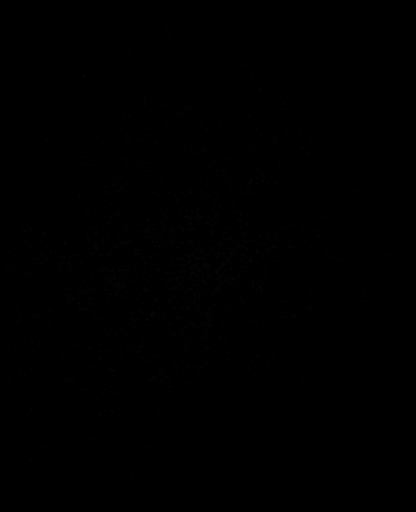

[Series 17: T2 · coronal · 5.0mm · 0.66mm/px · 5 of 56 slices shown (2 of 2)]
[im 1/56]
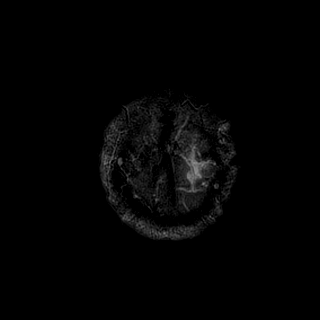
[im 14/56]
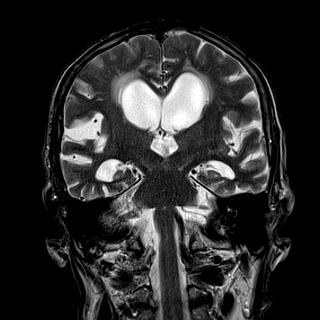
[im 28/56]
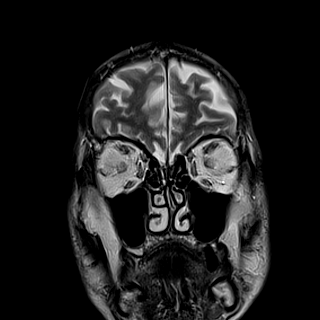
[im 42/56]
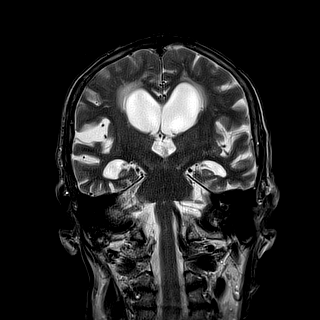
[im 56/56]
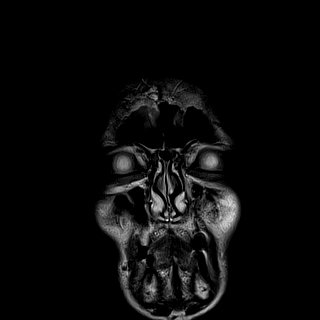

[35 of 48 positions shown; findings below may reference images not displayed]

FINDINGS: MRI HEAD FINDINGS

Brain: Tiny acute infarcts about the frontal horn of the right
lateral ventricle and atrium of the left lateral ventricle. No
hemorrhage or obstructive hydrocephalus. No mass or collection.

Advanced atrophy. There is a degree of corpus callosum up lifting in
communicating hydrocephalus is not excluded.

Chronic small vessel ischemia with moderate confluent rim around the
lateral ventricles.

Chronic superficial siderosis along the right frontal convexity.

Vascular: Chronic loss of right vertebral flow void, see below.

Skull and upper cervical spine: No focal marrow lesion.

Sinuses/Orbits: Bilateral cataract resection.

MRA HEAD FINDINGS

Motion degraded.

No branch occlusion, beading, or aneurysm. There is atheromatous
irregularity of the left vertebral, basilar, and bilateral carotids
without flow limiting stenosis. No flow is seen within the right
vertebral artery, chronic based on prior T2 weighted imaging. Flow
is still visible within the visible right PICA. Poor signal in the
distal posterior cerebral arteries is likely due to direction of
flow.
IMPRESSION: Brain MRI:

1. Two punctate acute infarcts in the periventricular white matter,
1 in each cerebral hemisphere.
2. Advanced atrophy. There may be superimposed communicating
hydrocephalus.

Intracranial MRA:

1. No emergent finding.
2. Chronic occlusion of the right vertebral artery.

ADDENDUM:
Omitted finding of superficial siderosis along the right frontal
convexity that is nonprogressive since 1350, likely from remote
trauma.

*** End of Addendum ***

## 2019-01-04 ENCOUNTER — Telehealth: Payer: Self-pay | Admitting: *Deleted

## 2019-01-04 NOTE — Telephone Encounter (Signed)
Unable to leave a message, no voicemail.
